# Patient Record
Sex: Female | Born: 1945 | Race: Black or African American | Hispanic: No | Marital: Married | Smoking: Current every day smoker
Health system: Southern US, Community
[De-identification: ages and names within clinical notes are randomized; demographics above are authoritative.]

## PROBLEM LIST (undated history)

## (undated) DIAGNOSIS — I5032 Chronic diastolic (congestive) heart failure: Secondary | ICD-10-CM

## (undated) DIAGNOSIS — I509 Heart failure, unspecified: Secondary | ICD-10-CM

## (undated) DIAGNOSIS — I2721 Secondary pulmonary arterial hypertension: Secondary | ICD-10-CM

## (undated) DIAGNOSIS — M549 Dorsalgia, unspecified: Secondary | ICD-10-CM

## (undated) DIAGNOSIS — E079 Disorder of thyroid, unspecified: Secondary | ICD-10-CM

## (undated) DIAGNOSIS — M1990 Unspecified osteoarthritis, unspecified site: Secondary | ICD-10-CM

## (undated) DIAGNOSIS — K219 Gastro-esophageal reflux disease without esophagitis: Secondary | ICD-10-CM

## (undated) DIAGNOSIS — I272 Pulmonary hypertension, unspecified: Secondary | ICD-10-CM

## (undated) DIAGNOSIS — Z720 Tobacco use: Secondary | ICD-10-CM

## (undated) DIAGNOSIS — J449 Chronic obstructive pulmonary disease, unspecified: Secondary | ICD-10-CM

## (undated) DIAGNOSIS — G629 Polyneuropathy, unspecified: Secondary | ICD-10-CM

## (undated) DIAGNOSIS — G8929 Other chronic pain: Secondary | ICD-10-CM

## (undated) DIAGNOSIS — IMO0002 Reserved for concepts with insufficient information to code with codable children: Secondary | ICD-10-CM

## (undated) DIAGNOSIS — I2510 Atherosclerotic heart disease of native coronary artery without angina pectoris: Secondary | ICD-10-CM

## (undated) DIAGNOSIS — F32A Depression, unspecified: Secondary | ICD-10-CM

## (undated) DIAGNOSIS — I10 Essential (primary) hypertension: Secondary | ICD-10-CM

## (undated) HISTORY — DX: Heart failure, unspecified: I50.9

## (undated) HISTORY — DX: Disorder of thyroid, unspecified: E07.9

## (undated) HISTORY — DX: Atherosclerotic heart disease of native coronary artery without angina pectoris: I25.10

## (undated) HISTORY — DX: Depression, unspecified: F32.A

## (undated) HISTORY — DX: Dorsalgia, unspecified: M54.9

## (undated) HISTORY — DX: Other chronic pain: G89.29

## (undated) HISTORY — DX: Reserved for concepts with insufficient information to code with codable children: IMO0002

## (undated) HISTORY — PX: BILROTH I PROCEDURE: SHX1231

## (undated) HISTORY — DX: Chronic diastolic (congestive) heart failure: I50.32

## (undated) HISTORY — DX: Tobacco use: Z72.0

## (undated) HISTORY — DX: Secondary pulmonary arterial hypertension: I27.21

## (undated) HISTORY — DX: Gastro-esophageal reflux disease without esophagitis: K21.9

## (undated) HISTORY — PX: STOMACH SURGERY: SHX791

## (undated) HISTORY — PX: CHOLECYSTECTOMY: SHX55

## (undated) HISTORY — DX: Pulmonary hypertension, unspecified: I27.20

## (undated) HISTORY — DX: Chronic obstructive pulmonary disease, unspecified: J44.9

## (undated) HISTORY — PX: BACK SURGERY: SHX140

## (undated) HISTORY — PX: ABDOMINAL HYSTERECTOMY: SHX81

## (undated) HISTORY — DX: Unspecified osteoarthritis, unspecified site: M19.90

---

## 2003-02-26 ENCOUNTER — Ambulatory Visit (HOSPITAL_COMMUNITY): Admission: RE | Admit: 2003-02-26 | Discharge: 2003-02-26 | Payer: Self-pay | Admitting: Family Medicine

## 2003-03-24 ENCOUNTER — Emergency Department (HOSPITAL_COMMUNITY): Admission: EM | Admit: 2003-03-24 | Discharge: 2003-03-24 | Payer: Self-pay | Admitting: Emergency Medicine

## 2003-05-18 ENCOUNTER — Ambulatory Visit (HOSPITAL_COMMUNITY): Admission: RE | Admit: 2003-05-18 | Discharge: 2003-05-18 | Payer: Self-pay | Admitting: Internal Medicine

## 2003-05-18 HISTORY — PX: ESOPHAGOGASTRODUODENOSCOPY: SHX1529

## 2003-05-18 HISTORY — PX: COLONOSCOPY: SHX174

## 2003-05-22 ENCOUNTER — Ambulatory Visit (HOSPITAL_COMMUNITY): Admission: RE | Admit: 2003-05-22 | Discharge: 2003-05-22 | Payer: Self-pay | Admitting: Internal Medicine

## 2003-06-11 ENCOUNTER — Ambulatory Visit (HOSPITAL_COMMUNITY): Admission: RE | Admit: 2003-06-11 | Discharge: 2003-06-11 | Payer: Self-pay | Admitting: Family Medicine

## 2004-01-10 ENCOUNTER — Emergency Department (HOSPITAL_COMMUNITY): Admission: EM | Admit: 2004-01-10 | Discharge: 2004-01-10 | Payer: Self-pay | Admitting: Emergency Medicine

## 2004-01-23 ENCOUNTER — Ambulatory Visit (HOSPITAL_COMMUNITY): Admission: RE | Admit: 2004-01-23 | Discharge: 2004-01-23 | Payer: Self-pay | Admitting: Family Medicine

## 2004-01-28 ENCOUNTER — Ambulatory Visit (HOSPITAL_COMMUNITY): Admission: RE | Admit: 2004-01-28 | Discharge: 2004-01-28 | Payer: Self-pay | Admitting: Family Medicine

## 2004-01-31 ENCOUNTER — Ambulatory Visit (HOSPITAL_COMMUNITY): Admission: RE | Admit: 2004-01-31 | Discharge: 2004-01-31 | Payer: Self-pay | Admitting: Family Medicine

## 2004-02-20 ENCOUNTER — Ambulatory Visit (HOSPITAL_COMMUNITY): Admission: RE | Admit: 2004-02-20 | Discharge: 2004-02-20 | Payer: Self-pay | Admitting: Neurosurgery

## 2005-02-24 ENCOUNTER — Ambulatory Visit (HOSPITAL_COMMUNITY): Admission: RE | Admit: 2005-02-24 | Discharge: 2005-02-24 | Payer: Self-pay | Admitting: Family Medicine

## 2005-08-08 ENCOUNTER — Emergency Department (HOSPITAL_COMMUNITY): Admission: EM | Admit: 2005-08-08 | Discharge: 2005-08-08 | Payer: Self-pay | Admitting: Emergency Medicine

## 2006-01-11 ENCOUNTER — Ambulatory Visit (HOSPITAL_COMMUNITY): Admission: RE | Admit: 2006-01-11 | Discharge: 2006-01-11 | Payer: Self-pay | Admitting: Family Medicine

## 2006-01-26 ENCOUNTER — Ambulatory Visit (HOSPITAL_COMMUNITY): Admission: RE | Admit: 2006-01-26 | Discharge: 2006-01-26 | Payer: Self-pay | Admitting: Family Medicine

## 2006-03-04 ENCOUNTER — Encounter: Admission: RE | Admit: 2006-03-04 | Discharge: 2006-03-04 | Payer: Self-pay | Admitting: Neurosurgery

## 2006-04-02 ENCOUNTER — Ambulatory Visit (HOSPITAL_COMMUNITY): Admission: RE | Admit: 2006-04-02 | Discharge: 2006-04-03 | Payer: Self-pay | Admitting: Neurosurgery

## 2006-06-18 ENCOUNTER — Ambulatory Visit (HOSPITAL_COMMUNITY): Admission: RE | Admit: 2006-06-18 | Discharge: 2006-06-18 | Payer: Self-pay | Admitting: Family Medicine

## 2006-10-28 ENCOUNTER — Emergency Department (HOSPITAL_COMMUNITY): Admission: EM | Admit: 2006-10-28 | Discharge: 2006-10-28 | Payer: Self-pay | Admitting: Emergency Medicine

## 2007-01-13 ENCOUNTER — Inpatient Hospital Stay (HOSPITAL_COMMUNITY): Admission: EM | Admit: 2007-01-13 | Discharge: 2007-01-18 | Payer: Self-pay | Admitting: Emergency Medicine

## 2007-02-06 ENCOUNTER — Inpatient Hospital Stay (HOSPITAL_COMMUNITY): Admission: EM | Admit: 2007-02-06 | Discharge: 2007-02-08 | Payer: Self-pay | Admitting: Emergency Medicine

## 2007-02-06 ENCOUNTER — Ambulatory Visit: Payer: Self-pay | Admitting: Cardiology

## 2007-05-19 ENCOUNTER — Encounter: Admission: RE | Admit: 2007-05-19 | Discharge: 2007-05-19 | Payer: Self-pay | Admitting: Neurosurgery

## 2007-06-21 ENCOUNTER — Ambulatory Visit (HOSPITAL_COMMUNITY): Admission: RE | Admit: 2007-06-21 | Discharge: 2007-06-21 | Payer: Self-pay | Admitting: Family Medicine

## 2007-08-31 ENCOUNTER — Ambulatory Visit: Admission: RE | Admit: 2007-08-31 | Discharge: 2007-08-31 | Payer: Self-pay | Admitting: Neurosurgery

## 2007-09-02 ENCOUNTER — Ambulatory Visit: Payer: Self-pay | Admitting: Cardiology

## 2007-09-08 ENCOUNTER — Ambulatory Visit: Payer: Self-pay | Admitting: Cardiology

## 2007-09-09 ENCOUNTER — Encounter: Admission: RE | Admit: 2007-09-09 | Discharge: 2007-09-09 | Payer: Self-pay | Admitting: Gastroenterology

## 2007-11-15 ENCOUNTER — Inpatient Hospital Stay (HOSPITAL_COMMUNITY): Admission: RE | Admit: 2007-11-15 | Discharge: 2007-11-18 | Payer: Self-pay | Admitting: Neurosurgery

## 2008-03-24 IMAGING — CR DG CHEST 1V PORT
1 series · 1 of 1 positions shown · non-contrast
Comparison: 01/14/07.

CLINICAL DATA: 61-year-old seizures, PICC line placement.  
 PORTABLE CHEST - 1 VIEW 02/07/07:

[view not recorded]
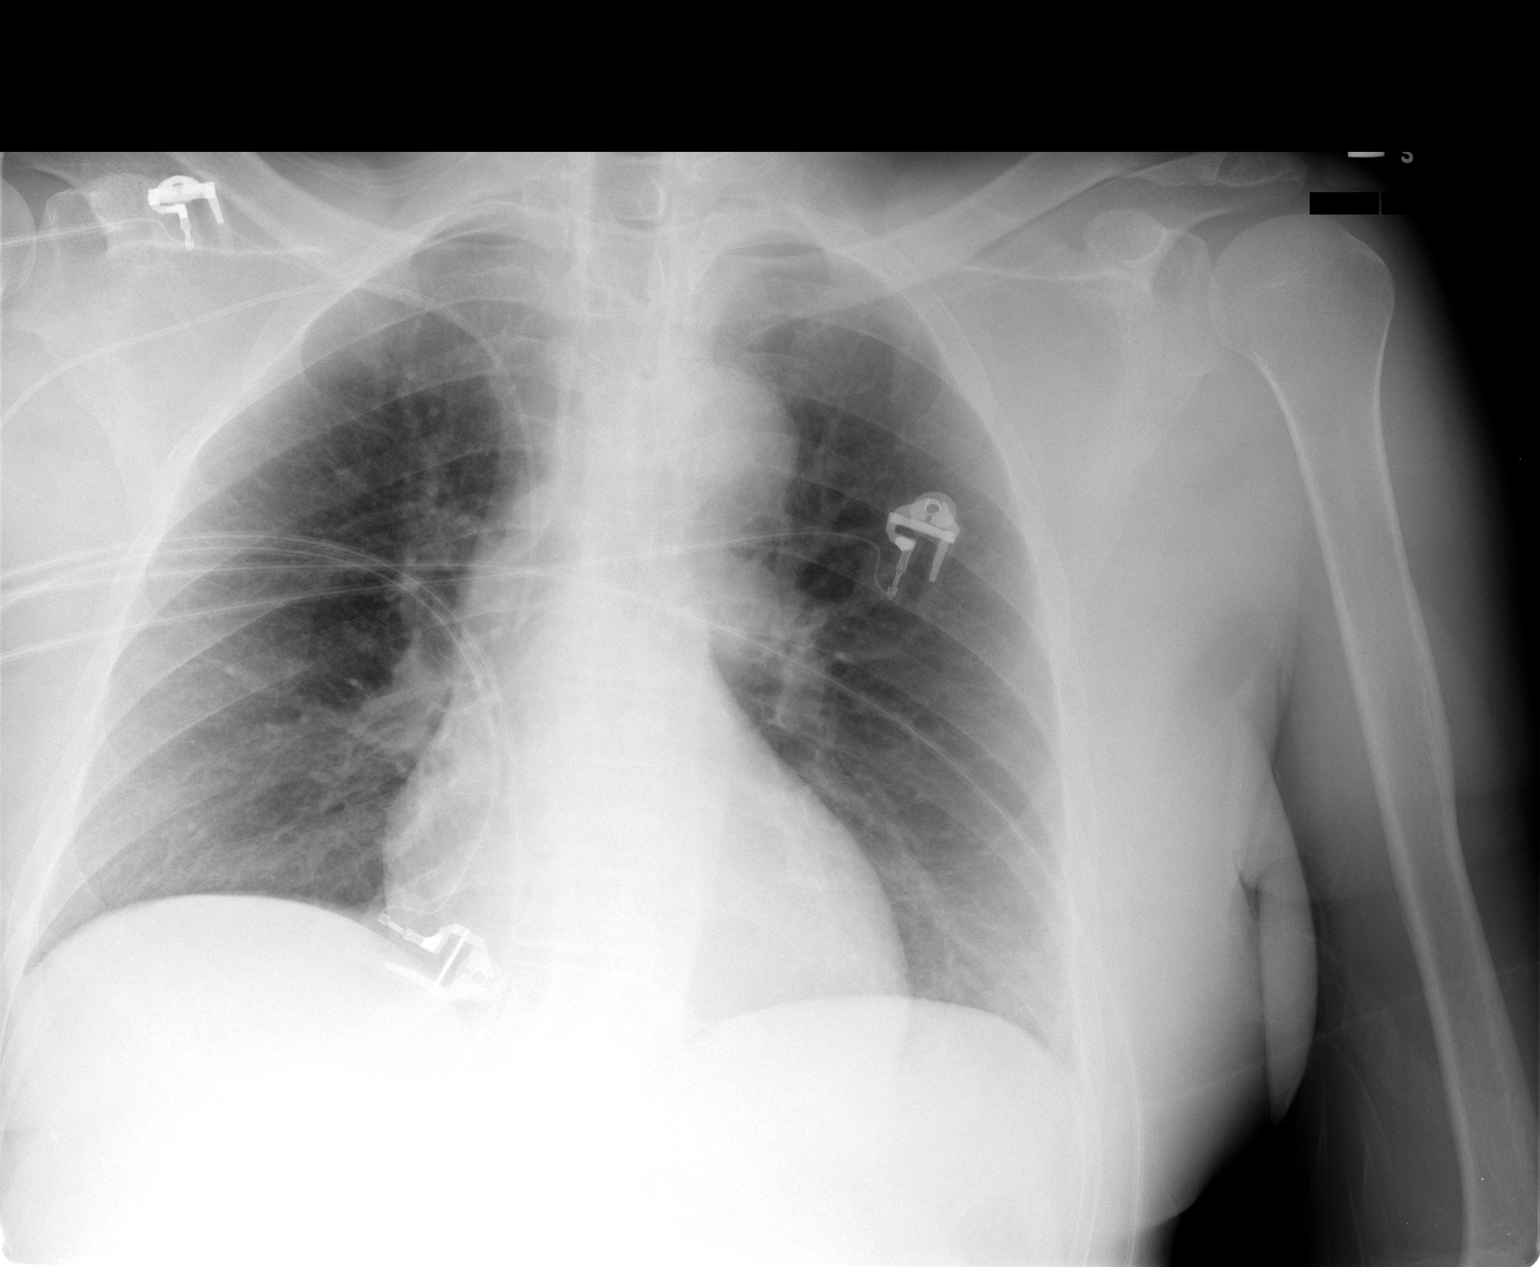

[1 of 1 positions shown; findings below may reference images not displayed]

FINDINGS: Cardiac silhouette, mediastinal contours are within normal limits and stable.  There is mild tortuosity of the thoracic aorta which is unchanged.  Slightly low lung volumes with mild vascular crowding and minimal streaky basilar atelectasis.  
 Right PICC line tip is in the distal SVC approximately 2 cm above the cavoatrial junction.
IMPRESSION: 1.  Right PICC line tip is in the distal SVC approximately 2 cm above the cavoatrial junction. 
 2.  No significant cardiopulmonary findings.  Minimal streaky basilar atelectasis.

## 2008-07-10 ENCOUNTER — Ambulatory Visit (HOSPITAL_COMMUNITY): Admission: RE | Admit: 2008-07-10 | Discharge: 2008-07-10 | Payer: Self-pay | Admitting: Family Medicine

## 2009-01-20 ENCOUNTER — Inpatient Hospital Stay (HOSPITAL_COMMUNITY): Admission: EM | Admit: 2009-01-20 | Discharge: 2009-01-21 | Payer: Self-pay | Admitting: Emergency Medicine

## 2009-07-30 ENCOUNTER — Ambulatory Visit (HOSPITAL_COMMUNITY): Admission: RE | Admit: 2009-07-30 | Discharge: 2009-07-30 | Payer: Self-pay | Admitting: Family Medicine

## 2009-11-26 ENCOUNTER — Ambulatory Visit (HOSPITAL_COMMUNITY): Admission: RE | Admit: 2009-11-26 | Discharge: 2009-11-26 | Payer: Self-pay | Admitting: Ophthalmology

## 2009-12-17 ENCOUNTER — Ambulatory Visit (HOSPITAL_COMMUNITY): Admission: RE | Admit: 2009-12-17 | Discharge: 2009-12-17 | Payer: Self-pay | Admitting: Ophthalmology

## 2010-06-19 LAB — BASIC METABOLIC PANEL
Creatinine, Ser: 0.55 mg/dL (ref 0.4–1.2)
GFR calc Af Amer: >60 mL/min (ref >60)
GFR calc non Af Amer: >60 mL/min (ref >60)

## 2010-07-10 LAB — BASIC METABOLIC PANEL
Calcium: 9.5 mg/dL (ref 8.4–10.5)
Chloride: 101 mEq/L (ref 96–112)
Creatinine, Ser: 0.68 mg/dL (ref 0.4–1.2)
GFR calc non Af Amer: >60 mL/min (ref >60)
Sodium: 136 mEq/L (ref 135–145)

## 2010-07-10 LAB — URINALYSIS, ROUTINE W REFLEX MICROSCOPIC
Bilirubin Urine: NEGATIVE
Glucose, UA: NEGATIVE mg/dL
Hgb urine dipstick: NEGATIVE
Ketones, ur: NEGATIVE mg/dL
Nitrite: NEGATIVE
Protein, ur: NEGATIVE mg/dL
Specific Gravity, Urine: 1.01 (ref 1.005–1.030)
Urobilinogen, UA: 0.2 mg/dL (ref 0.0–1.0)
pH: 6 (ref 5.0–8.0)

## 2010-07-10 LAB — CBC
HCT: 42.3 % (ref 36.0–46.0)
Hemoglobin: 14.2 g/dL (ref 12.0–15.0)
MCHC: 33.6 g/dL (ref 30.0–36.0)
MCV: 85.1 fL (ref 78.0–100.0)
Platelets: 206 K/uL (ref 150–400)
RBC: 4.97 MIL/uL (ref 3.87–5.11)
RDW: 15 % (ref 11.5–15.5)
WBC: 6.8 K/uL (ref 4.0–10.5)

## 2010-07-10 LAB — BASIC METABOLIC PANEL WITH GFR
BUN: 10 mg/dL (ref 6–23)
CO2: 28 meq/L (ref 19–32)
GFR calc Af Amer: >60 mL/min (ref >60)
Glucose, Bld: 93 mg/dL (ref 70–99)
Potassium: 3.9 meq/L (ref 3.5–5.1)

## 2010-07-10 LAB — DIFFERENTIAL
Basophils Absolute: 0.1 K/uL (ref 0.0–0.1)
Basophils Relative: 2 % — ABNORMAL HIGH (ref 0–1)
Eosinophils Absolute: 0 K/uL (ref 0.0–0.7)
Eosinophils Relative: 1 % (ref 0–5)
Lymphocytes Relative: 39 % (ref 12–46)
Lymphs Abs: 2.7 K/uL (ref 0.7–4.0)
Monocytes Absolute: 0.3 K/uL (ref 0.1–1.0)
Monocytes Relative: 5 % (ref 3–12)
Neutro Abs: 3.7 K/uL (ref 1.7–7.7)
Neutrophils Relative %: 54 % (ref 43–77)

## 2010-07-10 LAB — CK: Total CK: 92 U/L (ref 7–177)

## 2010-07-30 ENCOUNTER — Inpatient Hospital Stay (HOSPITAL_COMMUNITY): Admit: 2010-07-30 | Payer: Self-pay | Admitting: *Deleted

## 2010-08-05 ENCOUNTER — Encounter (HOSPITAL_COMMUNITY): Payer: Self-pay

## 2010-08-05 ENCOUNTER — Ambulatory Visit (HOSPITAL_COMMUNITY)
Admission: RE | Admit: 2010-08-05 | Discharge: 2010-08-05 | Disposition: A | Discharge status: Home / Self Care | Payer: Medicare Other | Source: Ambulatory Visit | Attending: Family Medicine | Admitting: Family Medicine

## 2010-08-05 ENCOUNTER — Other Ambulatory Visit (HOSPITAL_COMMUNITY): Payer: Self-pay | Admitting: Family Medicine

## 2010-08-05 DIAGNOSIS — K769 Liver disease, unspecified: Secondary | ICD-10-CM | POA: Insufficient documentation

## 2010-08-05 DIAGNOSIS — R1032 Left lower quadrant pain: Secondary | ICD-10-CM

## 2010-08-05 HISTORY — DX: Essential (primary) hypertension: I10

## 2010-08-05 MED ORDER — IOHEXOL 300 MG/ML  SOLN
100.0000 mL | Freq: Once | INTRAMUSCULAR | Status: AC | PRN
  Administered 2010-08-05: 100 mL via INTRAVENOUS

## 2010-08-09 NOTE — H&P (Signed)
  NAMEMAYAH, URQUIDI        ACCOUNT NO.:  [id number]  MEDICAL RECORD NO.:  [id number]           PATIENT TYPE:  O  LOCATION:  RAD                           FACILITY:  [facility]  PHYSICIAN:  Jamesetta So, M.D.  DATE OF BIRTH:  Sep 12, 1945  DATE OF ADMISSION:  08/05/2010 DATE OF DISCHARGE:  05/01/2012LH                             HISTORY & PHYSICAL   CHIEF COMPLAINT:  GERD, abnormal CT scan of the abdomen.  HISTORY OF PRESENT ILLNESS:  The patient is a 65 year old black female who presents for endoscopic evaluation.  She needs an EGD and colonoscopy due to history of worsening GERD and reflux as well as the need for screening colonoscopy.  She has been noted some increasing heartburn for the past few months.  She also had an abnormal CT scan of the abdomen and pelvis, which revealed increased thickening of the wall of the transverse colon.  No hematochezia, melena, or diarrhea have been noted.  Some constipation has been noted.  PAST MEDICAL HISTORY:  Hypertension.  PAST SURGICAL HISTORY:  Appendectomy, Billroth I gastrectomy, hysterectomy, cholecystectomy, back surgery.  CURRENT MEDICATIONS:  Hydrocodone, vitamins, hydrochlorothiazide, Prevacid, clonazepam.  ALLERGIES:  No known drug allergies.  REVIEW OF SYSTEMS:  The patient smokes several cigarettes a day.  She denies any alcohol or recreational drug use.  FAMILY MEDICAL HISTORY:  There is no family medical history of colon cancer.  PHYSICAL EXAMINATION:  GENERAL:  The patient is a well-developed, well- nourished black female in no acute distress. VITAL SIGNS:  Stable.  She is afebrile. HEENT:  Unremarkable. NECK:  Supple without lymphadenopathy. LUNGS:  Clear to auscultation with equal breath sounds bilaterally. HEART:  Regular rate and rhythm without S3, S4, or murmurs. ABDOMEN:  Soft, nontender, nondistended.  No hepatosplenomegaly or masses noted.  Multiple surgical scars are present. RECTAL:  Deferred to  the procedure.  IMPRESSION:  Gastroesophageal reflux disease, need for screening colonoscopy.  PLAN:  The patient is scheduled for an EGD and colonoscopy on Aug 19, 2010.  Risks and benefits of the procedure including bleeding and perforation were fully explained to the patient, gave informed consent.     Jamesetta So, M.D.     MAJ/MEDQ  D:  08/07/2010  T:  08/08/2010  Job:  [id number]  cc:   Estill Bamberg. Karie Kirks, M.D. Fax: [phone]  Electronically Signed by Aviva Signs M.D. on 08/09/2010 08:07:07 PM

## 2010-08-12 ENCOUNTER — Ambulatory Visit (HOSPITAL_COMMUNITY)
Admission: RE | Admit: 2010-08-12 | Discharge: 2010-08-12 | Disposition: A | Discharge status: Home / Self Care | Payer: Medicare Other | Source: Ambulatory Visit | Attending: General Surgery | Admitting: General Surgery

## 2010-08-12 DIAGNOSIS — I10 Essential (primary) hypertension: Secondary | ICD-10-CM | POA: Insufficient documentation

## 2010-08-12 DIAGNOSIS — Z79899 Other long term (current) drug therapy: Secondary | ICD-10-CM | POA: Insufficient documentation

## 2010-08-12 DIAGNOSIS — Z1211 Encounter for screening for malignant neoplasm of colon: Secondary | ICD-10-CM | POA: Insufficient documentation

## 2010-08-12 DIAGNOSIS — K219 Gastro-esophageal reflux disease without esophagitis: Secondary | ICD-10-CM | POA: Insufficient documentation

## 2010-08-12 HISTORY — PX: COLONOSCOPY: SHX174

## 2010-08-12 HISTORY — PX: ESOPHAGOGASTRODUODENOSCOPY: SHX1529

## 2010-08-13 NOTE — Op Note (Signed)
NAME:  [name], [name]        ACCOUNT NO.:  [id number]  MEDICAL RECORD NO.:  [id number]           PATIENT TYPE:  O  LOCATION:  DAYP                          FACILITY:  [facility]  PHYSICIAN:  Jamesetta So, M.D.  DATE OF BIRTH:  16-Sep-1945  DATE OF PROCEDURE:  08/12/2010 DATE OF DISCHARGE:                              OPERATIVE REPORT   PREOPERATIVE DIAGNOSIS:  Worsening gastroesophageal reflux disease, need for screening colonoscopy, abnormal CT scan of the abdomen.  POSTOPERATIVE DIAGNOSIS:  Worsening gastroesophageal reflux disease, need for screening colonoscopy, abnormal CT scan of the abdomen.  PROCEDURE:  Colonoscopy, EGD with biopsy.  SURGEON:  Jamesetta So, MD  ANESTHESIA: 1. Demerol 50 mg IV. 2. Versed 4 mg IV  INDICATIONS:  The patient is a 65 year old black female with multiple medical problems who presents with a CT scan of the abdomen and pelvis which revealed possible inflammatory changes in the transverse colon as well as worsening GERD.  The patient now comes to the endoscopy suite for colonoscopy and EGD.  Risks and benefits of the procedure including bleeding and perforation were fully explained to the patient, gave informed consent.  PROCEDURE NOTE:  The patient was placed in left lateral decubitus position after placement of nasal cannula, pulse oximetry, and noninvasive blood pressure monitoring.  Demerol and Versed were used throughout the procedure for anesthesia.  Rectal examination was performed which was negative.  The colonoscope was advanced to the cecum with mild difficulty secondary to tortuosity of the colon.  The bowel preparation was adequate.  The cecum was visualized and noted to be within normal limits.  Confirmation of placement to the cecum was done using transabdominal palpation and landmarks.  The ascending colon, transverse colon, descending colon, sigmoid colon, and rectum were all within normal limits.  No abnormal lesions  were noted.  On retroflexion of the colonoscope, the dentate line was inspected, noted to be within normal limits.  The transverse colon was looked at closely and no abnormal mucosal ulcerations were seen.  All air was evacuated from the colon and rectum prior to removal of the endoscope.  Next, an EGD was performed.  Hurricaine spray was used on the oropharynx.  The endoscope was advanced down to the duodenum and jejunum without difficulty.  The patient is status post a Billroth I gastroduodenostomy.  The anastomosis was noted to be widely patent.  No frank ulcerations were seen.  It appears that the patient has had a significant amount of her stomach excised.  A biopsy of the remaining stomach was taken and sent to pathology for CLO-test to rule out Helicobacter pylori infection.  There was no evidence of a hiatal hernia.  The Z-line was inspected and noted to be within normal limits. No strictures were seen.  The esophagus was within normal limits.  No frank ulcerations or esophagitis were seen.  All air was evacuated from the stomach and esophagus prior to removal of the endoscope.  The patient tolerated both procedures well and was transferred back to Day Surgery in stable condition.  COMPLICATIONS:  None.  SPECIMEN:  Stomach biopsy for CLO-test.  RECOMMENDATIONS:  The patient should continue her  b.i.d. dosing of a proton pump inhibitor.  She should be made aware that she needs to eat more small frequent meals and to sit upright when eating.  A followup colonoscopy is recommended in 10 years.  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     Jamesetta So, M.D.     MAJ/MEDQ  D:  08/12/2010  T:  08/13/2010  Job:  [id number]  cc:   Estill Bamberg. Karie Kirks, M.D. Fax: [phone]  Electronically Signed by Aviva Signs M.D. on 08/13/2010 09:18:32 AM

## 2010-08-19 NOTE — Group Therapy Note (Signed)
[name], [name]        ACCOUNT NO.:  [id number]   MEDICAL RECORD NO.:  [id number]          PATIENT TYPE:  INP   LOCATION:  IC03                          FACILITY:  [facility]   PHYSICIAN:  Estill Bamberg. Karie Kirks, M.D.DATE OF BIRTH:  1945/09/06   DATE OF PROCEDURE:  DATE OF DISCHARGE:                                 PROGRESS NOTE   SUBJECTIVE:  The patient upset, said she wants to go home.   OBJECTIVE:  VITALS:  Today the BP is 96/59, earlier 81/46.  Temperature  97.9, pulse 71, respiratory rate 21.  GENERAL:  She is crying and upset.  Appears to be well-developed, well-  nourished.  No acute distress other than upset about being in hospital.  LUNGS:  Clear throughout.  HEART:  Regular rhythm.  Rate of about 70.  ABDOMEN:  Soft.  EXTREMITIES:  There is no edema of the ankles.   A rhythm strip was examined at the time she is having seizures and looks  normal other than the artifact.   She had recurrent seizures last night in emergency room.  I discussed  case with Dr. Merlene Laughter, neurologist, and gave another 500 mg of  Fosphenytoin IV.  He has had a couple of seizures last night with this  but are controlled with Ativan.   ASSESSMENT:  Status epilepticus.   PLAN:  1. Continue the ICU.  We are adding Depakote 1 gram IV immediately      followed by 500 mg IV q.8 h.  Dilantin level pending.  The Dr.      Merlene Laughter will be seeing her today.  He is also being started on low-      dose phenobarbital.  2. We are holding the triamterene HCTZ since her blood pressure is so      low today.  Adding on Vicodin 5/500 for the low back pain she      engendered with falling at home.   Her O2 sat was 96% 2 liters nasal cannula.        Estill Bamberg. Karie Kirks, M.D.  Electronically Signed    SDK/MEDQ  D:  02/07/2007  T:  02/07/2007  Job:  [id number]

## 2010-08-19 NOTE — Group Therapy Note (Signed)
NAMEJALACIA, [name]        ACCOUNT NO.:  [id number]   MEDICAL RECORD NO.:  [id number]          PATIENT TYPE:  INP   LOCATION:  IC03                          FACILITY:  [facility]   PHYSICIAN:  Edward L. Luan Pulling, M.D.DATE OF BIRTH:  08-08-1945   DATE OF PROCEDURE:  DATE OF DISCHARGE:                                 PROGRESS NOTE   Patient of Dr. Karie Kirks.  [name] is more alert.  Dr. Merlene Laughter has  seen her and it is not totally clear why she has had seizures, but she  does now have a therapeutic Dilantin level at 10.3 and her respiratory  failure was clearly related to her seizure disorder, so I think it is  reasonable for [place] to go ahead and see if we get her off the ventilator  now.   EXAM:  Shows that she is alert.  Pulse is in the 90s, blood pressure about 120/70.  Her chest is fairly clear.  Her heart is regular.  Her abdomen soft.   Her Dilantin level 10.3.  BMET normal.  Blood gas on 40%, 500 rate of  12, 5 of PEEP, pH 7.44, pCO2 of 37, pO2 of 92, and CBC shows white count  8700, hemoglobin 12.1, platelets 233,000.   ASSESSMENT:  She seems better.   PLAN:  I am going to see if we can work on, hopefully, getting her off  of the ventilator today.      Edward L. Luan Pulling, M.D.  Electronically Signed     ELH/MEDQ  D:  01/14/2007  T:  01/14/2007  Job:  [id number]

## 2010-08-19 NOTE — H&P (Signed)
NAMEJAQUISHA, FRECH        ACCOUNT NO.:  [id number]   MEDICAL RECORD NO.:  [id number]          PATIENT TYPE:  INP   LOCATION:  IC03                          FACILITY:  [facility]   PHYSICIAN:  Angus G. Everette Rank, MD   DATE OF BIRTH:  09/12/1945   DATE OF ADMISSION:  01/13/2007  DATE OF DISCHARGE:  LH                              HISTORY & PHYSICAL   This 65 year old female apparently had a seizure prior to admission.  The patient apparently collapsed after having this seizure and stopped  breathing, and she was resuscitated.  Apparently had a series of five  seizures en route to the ED.  The patient was seen by ED physician and  was intubated and was on the respirator.  At the time I saw her, the  patient was fairly comfortable.  Apparently a study was done, a CT of  the head was done shortly after admission to the ED and was negative.  Lab studies:  Drug screen positive for benzodiazepines.  Blood gases  initially showed a pH of 7.400 with a pCO2 of 41.4, pO2 163.  Chemistries:  Sodium 137, potassium 3.7, chloride 103, CO2 25, glucose  110, BUN 24, creatinine 0.67.  GFR greater than 60.  Liver enzymes  essentially normal, SGOT 16, SGPT 14, total protein 7.6.  CBC:  WBC 8500  with a hemoglobin of 13.8, hematocrit 40.9.  blood cultures were  obtained.  Urine culture obtained.  The patient was started on  intravenous fluids and IV Rocephin and Zithromax.  I discussed this  situation with the ED physician and the patient was subsequently  admitted to ICU.  She was given Dilantin in emergency department.   The patient has a past medical history of hypertension, sciatica.  Surgery:  Appendectomy, cholecystectomy, hysterectomy   SOCIAL HISTORY:  The patient does not drink alcohol or use drugs.  Smokes a half-pack of cigarettes daily.   ALLERGIES:  No known drug allergies.   MEDICATION LIST:  1. Pantoprazole 40 mg daily.  2. Triamterene/hydrochlorothiazide 37.5/25 mg daily.   REVIEW  OF SYSTEMS:  Unable to obtain.   EXAMINATION:  The patient at the time of my exam, she was on the  ventilator.  Blood pressure 155/60, respirations 28, pulse 111.  HEENT:  Eyes:  PERRLA.  TMs negative.  Oropharynx benign.  NECK:  Supple.  No JVD or thyroid abnormalities.  HEART:  Regular rhythm, no murmurs.  LUNGS:  Clear to P&A.  NEUROLOGIC:  No focal deficit.  ABDOMEN:  No palpable organs or masses.  EXTREMITIES:  Free of edema.   DIAGNOSIS:  Multiple seizures of undetermined etiology.  The patient was  apneic following seizure and placed on a respirator.  No evidence of  intracranial bleeding, tumor, etc.   PLAN:  To continue the patient on the respirator.  Dr. Luan Pulling and Dr.  Merlene Laughter will see the patient in consultation.  The patient will be  admitted to ICU.      Angus G. Everette Rank, MD  Electronically Signed     AGM/MEDQ  D:  01/13/2007  T:  01/14/2007  Job:  [id number]

## 2010-08-19 NOTE — Group Therapy Note (Signed)
[name], THREATS        ACCOUNT NO.:  [id number]   MEDICAL RECORD NO.:  [id number]          PATIENT TYPE:  INP   LOCATION:  A219                          FACILITY:  [facility]   PHYSICIAN:  Estill Bamberg. Karie Kirks, M.D.DATE OF BIRTH:  [birth year]   DATE OF PROCEDURE:  DATE OF DISCHARGE:                                 PROGRESS NOTE   SUBJECTIVE:  She is really feeling fine today.  She has had no more  seizures.  Still in the ICU.  She is able to eat.   I went over the history of her seizures again.  She had been working at  the [employer], drove home, fell a little bit weak, began to be  shaky, then began to have a seizure in front of her husband.  He  personally witnessed this.  She was unresponsive during the seizure.   OBJECTIVE:  She is now sitting up in a chair.  She is well-developed,  well-nourished oriented, alert.  No acute distress.  BP 106/76, pulse 102, respiratory rate 22.  O2 sat on 2L 99%.  Lungs appear clear throughout.  She is moving air well.  HEART:  A regular rhythm rate of about 90.  ABDOMEN:  Soft.  There is no edema of the ankles.   ASSESSMENT:  Epilepsy question etiology.   PLAN:  MRI the brain is still pending.  We will transfer out to the  floor, take out her Foley, keep her on CCNT at present.      Estill Bamberg. Karie Kirks, M.D.  Electronically Signed     SDK/MEDQ  D:  01/15/2007  T:  01/15/2007  Job:  [id number]

## 2010-08-19 NOTE — Procedures (Signed)
[name], [name]        ACCOUNT NO.:  [id number]   MEDICAL RECORD NO.:  [id number]          PATIENT TYPE:  INP   LOCATION:  IC03                          FACILITY:  [facility]   PHYSICIAN:  Kofi A. Merlene Laughter, M.D. DATE OF BIRTH:  [birth year]   DATE OF PROCEDURE:  DATE OF DISCHARGE:                              EEG INTERPRETATION   This is a 65 year old lady who presents with status epilepticus and  confusion.   MEDICATIONS:  1. Dilantin.  2. Protonix.  3. Nicotine.  4. Rocephin.  5. Azithromycin.  6. Diprivan.  7. Fentanyl.  8. P.r.n. Ativan.   ANALYSIS:  This is a 16-channel recording that is conducted for 22  minutes. There is a posterior rhythm of 12 hertz which attenuates with  eye opening. Awake and asleep architectures are observed. There is K  complexes and spindles observed throughout the recording. In fact, the  patient had excessive spindling noted throughout the recording. Photic  stimulation does not result in significant changes in the background  activity. There is no focal slowing, lateralized slowing or epileptiform  activity observed.   IMPRESSION:  This recording shows excessive spindling which is a  medication effect easily from benzodiazepines. There is no epileptiform  activity observed. A single EEG recording does not rule out epileptic  seizures.  A sleep-deprived recording is suggested.      Kofi A. Merlene Laughter, M.D.  Electronically Signed     KAD/MEDQ  D:  01/15/2007  T:  01/15/2007  Job:  [id number]

## 2010-08-19 NOTE — Group Therapy Note (Signed)
NAMEKIANAH, HARRIES        ACCOUNT NO.:  [id number]   MEDICAL RECORD NO.:  [id number]          PATIENT TYPE:  INP   LOCATION:  A219                          FACILITY:  [facility]   PHYSICIAN:  Edward L. Luan Pulling, M.D.DATE OF BIRTH:  1945/06/12   DATE OF PROCEDURE:  01/16/2007  DATE OF DISCHARGE:                                 PROGRESS NOTE   Ms. Corrigan has now been transferred from the intensive care unit and  seems to be doing better.  She keeps asking about going home.  She does  not seem to be having any respiratory distress now.   Temperature is 98.2, pulse 86, respirations 18, blood pressure 109/65,  O2 saturation 96% on 1 liter.   PLAN:  Will be to go ahead and sign off at this point.  I will, of  course, be glad to see her at anytime at your request.  Obviously, I  think, her major problem was not a lung problem, but more of the seizure  disorder; and she has what may be a mass in the cerebellum now.  Thanks  for allowing me to see her with you.      Edward L. Luan Pulling, M.D.  Electronically Signed     ELH/MEDQ  D:  01/16/2007  T:  01/17/2007  Job:  [id number]

## 2010-08-19 NOTE — Op Note (Signed)
NAMETENELLE, ANDREASON        ACCOUNT NO.:  [id number]   MEDICAL RECORD NO.:  [id number]          PATIENT TYPE:  INP   LOCATION:  3012                         FACILITY:  [facility]   PHYSICIAN:  Elizabeth Sauer, M.D.      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   PROCEDURES:  1. L3-4, L4-5, and L5-S1, left-sided TLIF.  2. Segmental pedicle screw fixation from L3-S1.  3. Posterolateral arthrodesis L3-S1.   SURGEON:  Elizabeth Sauer, MD   ANESTHESIA:  General endotracheal.   PREPARATION:  Prepped and draped with alcohol wipe.   COMPLICATIONS:  None.   NURSE ASSISTANT:  [place].   DOCTOR ASSISTANT:  Ashok Pall, MD   BODY OF TEXT:  A 66 year old lady with left L3-4 foraminal diskectomy  done couple of years ago with scarring and left foraminal obstruction  and a new foraminal disk on the left side at L4-5.  Taken in the  operating room, smoothly anesthetized and intubated, was placed prone on  the operating table.  Following shave, prep, and drape in the usual  sterile fashion, the skin was then infiltrated with 1% lidocaine with  1:4000 epinephrine.  The skin was incised from mid L3 to mid S1 and the  lamina, and the transverse processes of L3, L4, L5 and the sacral ala  were exposed bilaterally in subperiosteal plane.  Intraoperative x-ray  confirmed correctness level, having confirmed correctness level on the  left side, the pars reticularis and lamina inferior facet of L3, L4 and  L5 were removed and the left L3, L4 and L5 and S1 roots dissected free.  At L3-4, there was foraminal obstructions secondary to scar and a small  amount of disk.  At L4-5, there was a large foraminal disk removed, the  L5-S1  had relatively normal anatomy.  Following complete decompression  at these levels, 10 x 30-mm PEEK cages were placed in an transverse  fashion at L4-5 and L5-S1.  At L3-L4, a 25 x 10-mm PEEK cages were  placed.  They had previously been packed with bone graft harvested from  the facet resection.  Pedicle screws were then placed bilaterally at L3,  L4, L5 and S1, and intraoperative x-ray showed good placement of screws  and cages.  The screws were attached to the rods with the locking caps.  The transverse processes on both sides and the lamina on the right side  of L3, L4, L5, and S1 were decorticated with the high-speed drill and  packed with BMP on the extender matrix.  Final tightening was applied to  the locking caps.  Final x-ray showed good placement of rods, screws,  and cages.  Successive layers of 0 Vicryl, 2-0 Vicryl, and 3-0 nylon  were used to close.  Betadine and Telfa dressing was applied and made  occlusive with OpSite and the patient returned to the recovery  room in  good condition.           ______________________________  Elizabeth Sauer, M.D.     MWR/MEDQ  D:  11/15/2007  T:  11/16/2007  Job:  [id number]

## 2010-08-19 NOTE — Group Therapy Note (Signed)
[name], [name]        ACCOUNT NO.:  [id number]   MEDICAL RECORD NO.:  [id number]          PATIENT TYPE:  INP   LOCATION:  A219                          FACILITY:  [facility]   PHYSICIAN:  Estill Bamberg. Karie Kirks, M.D.DATE OF BIRTH:  [birth year]   DATE OF PROCEDURE:  DATE OF DISCHARGE:                                 PROGRESS NOTE   SUBJECTIVE:  The patient really wants to go home.  Does note a trembling  in her thigh she has had ever since she had come in the hospital.  She  notes she can not really feel this on the outside, but feels it inside;  has not gotten better either with anxiolytics or narcotic pain  medication.  She has had no further seizures.  She does note, however,  while she was having the seizure at home she was perfectly awake, but  she just could not control the shaking of her arms or legs.   OBJECTIVE:  Temperature 98.2, pulse 86, rest rate 18, blood pressure  109/65.  She is a woman who is tall and thin, is ambulating in the room.  She is  really in no acute distress.  She appears to be oriented and alert.  She  broke out into tears as we discussed her diagnosis, however.  HEART:  A regular rhythm rate of 90.  LUNGS:  Clear throughout.  ABDOMEN:  Soft without tenderness.  She stood up and could feel the trembling in her legs, but I could not  feeling any trembling fairly on palpating the outside skin of the  thighs.  O2 sat is 96% on 1 liter.   MRI the brain with contrast was consistent with multiple sclerosis, but  this diagnosis was not conclusive.   ASSESSMENT:  1. Probable multiple sclerosis.  2. 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   PLAN:  Continue with the Dilantin.  Discussed the possibility that she  does have MS and the need for the neurologist to confirm this.  Meanwhile, we will get her ambulating with assistance.  She was  extremely upset about the possible diagnosis, but I tried to reassure  her in that we would let  the neurologist help [place] control this disease,  if in fact that is what it is.      Estill Bamberg. Karie Kirks, M.D.  Electronically Signed     SDK/MEDQ  D:  01/16/2007  T:  01/17/2007  Job:  [id number]

## 2010-08-19 NOTE — Consult Note (Signed)
[name], [name]        ACCOUNT NO.:  [id number]   MEDICAL RECORD NO.:  [id number]          PATIENT TYPE:  INP   LOCATION:  IC03                          FACILITY:  [facility]   PHYSICIAN:  [name]. Lattie Haw, MD, FACCDATE OF BIRTH:  1946-02-28   DATE OF CONSULTATION:  02/08/2007  DATE OF DISCHARGE:  02/08/2007                                 CONSULTATION   HISTORY OF PRESENT ILLNESS:  A 65 year old woman referred for evaluation  of abnormal telemetry tracings.  [name] has no significant  cardiac history.  She has never previously been evaluated by a  cardiologist.  She believes she has had an echocardiogram at [facility],  but there is no record of such testing.  She has good exercise  tolerance, walking fair distances without symptoms.  She has not  experienced any significant chest discomfort.  She has occasional  episodes of lightheadedness when arising from a chair or from a lying  position, but has not suffered syncope other than as associated with  grand mal seizures.  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  She returned with recurrent seizures.  She had a rapid  activity on her telemetry strips during seizures.  Although some of the  tracings are in decipherable, most of them clearly show underlying sinus  tachycardia.   PAST MEDICAL HISTORY:  1. Hypertension that has been well-controlled with medical therapy.  2. She has not had diabetes.  3. She is cigarette smoker with total consumption of approximately 20      - 30 pack-years.  4. She he is unaware of her cholesterol status.   SURGICAL PROCEDURES:  1. Appendectomy.  2. Cholecystectomy.  3. Hysterectomy.  4. 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  She has undergone      additional abdominal surgery for bowel obstruction and has had a      partial gastrectomy.   MEDICATIONS ON ADMISSION:  1. Dilantin 300 mg daily.  2. Dyazide 37.5/25 mg daily.  3. Protonix 40 mg daily.  4.  Hydrocodone p.r.n.   ALLERGIES:  NO KNOWN DRUG ALLERGIES.   SOCIAL HISTORY:  No excessive use of alcohol; married and lives locally.   FAMILY HISTORY:  Both parents are deceased and both had severe  hypertension.   REVIEW OF SYSTEMS:  Notable for chronic back pain, some arthritic  discomfort elsewhere, a regular diet at home, impaired vision with  corrective lenses required for reading, upper dentures.  She reports  anxiety.  She has no sleep disorder.  All other systems reviewed and are  negative.   PHYSICAL EXAMINATION:  GENERAL:  Pleasant woman in no acute distress.  VITAL SIGNS:  The heart rate is 90 and regular, blood pressure 120/80,  respirations 14 and unlabored.  Afebrile.  HEENT:  EOMs full; anicteric sclerae; normal lids and conjunctiva.  NECK:  No jugular venous distention; no carotid bruits.  ENDOCRINE:  No thyromegaly.  LUNGS:  Clear.  CARDIAC:  Normal first and second heart sounds; fourth heart sound  present.  ABDOMEN:  Soft and nontender; no organomegaly.  EXTREMITIES:  Edema 1/2+ on the  left.   STUDIES:  EKG:  Normal sinus rhythm; left atrial abnormality; delayed R-  wave progression.   LABORATORY DATA:  Other laboratories notable for normal CBC and normal  chemistry profile.   IMPRESSION:  [name] has fairly modest cardiovascular risk factors  and no symptoms to suggest significant cardiac disease.  The abnormal  telemetry tracings represent artifact.  Blood pressure control is good.  I would recommend no further cardiac testing and see no reason to  prolong her hospitalization.  I will be available to assist in her care  in the future as needed.      [name]. Lattie Haw, MD, [facility]  Electronically Signed     RMR/MEDQ  D:  02/08/2007  T:  02/09/2007  Job:  [id number]

## 2010-08-19 NOTE — Consult Note (Signed)
[name], [name]        ACCOUNT NO.:  [id number]   MEDICAL RECORD NO.:  [id number]          PATIENT TYPE:  INP   LOCATION:  IC03                          FACILITY:  [facility]   PHYSICIAN:  Kofi A. Merlene Laughter, M.D. DATE OF BIRTH:  1945-10-02   DATE OF CONSULTATION:  02/07/2007  DATE OF DISCHARGE:                                 CONSULTATION   REASON FOR CONSULTATION:  Multiple seizures/status epilepticus.   A 65 year old black female who presented a few weeks ago with new onset  seizure.  At that time, the patient had 5 seizures back to back.  She  was loaded with Dilantin and discharged home.  She did have an EEG while  hospitalized which was unrevealing.  Other workup was unrevealing.  She  was apparently doing well until last week or so when she apparently  started having multiple seizures again.  They are characterized by  generalized shaking with apparently no oral trauma or urine  incontinence.  She does appear to return to baseline rather quickly.  The patient was given Ativan and after the patient called the office but  apparently she continued to have these event and she was subsequently  taken to the hospital and admitted.  She was placed in the ICU and  loaded with Dilantin because her level was in the lower therapeutic  range; however, she continued to have events and was subsequently given  Depakote.  I happened to actually catch the last event on entering the  exam room.  It appeared that she had at least 2 events, one during the  EEG recording which I personally did not see but she had another event  just after the EEG recording on me entering the exam room.  It was shake  involving upper and lower extremities characterized by moderate  amplitude, moderate frequency activity.  The patient reports not having  significant loss of consciousness or alteration of consciousness during  this current event and apparently has some alteration of consciousness  during some of  these events.  During the present event, this was not the  case.  The patient returned to baseline immediately after the event  without any postictal state.   PAST MEDICAL HISTORY:  1. Seizures.  2. Gastroesophageal reflux disease.  3. Hypertension.  4. Anxiety disorder.   SOCIAL HISTORY:  She has a very supportive family.  She is married and  lives with her husband, has a son.   ADMISSION MEDICATION:  1. Dilantin 300 mg a day.  2. Protonix 40 mg a day.  3. Maxzide and Ativan.   REVIEW OF SYSTEMS:  Essentially unrevealing.  No clear focal neurologic  symptoms.  No headaches.  The patient does not report having any  significant psychosocial stressors.  She does work in the [employer].  She does have a history of smoking half a pack of cigarettes  a day. No illicit drug use.  No alcohol use.   FAMILY HISTORY:  There is apparently a family history of seizures.   PHYSICAL EXAMINATION:  Shows a pleasant, thin lady in no acute distress.  Temperature 98.3.  Pulse  84.  Respirations 22.  Blood pressure 170/74.  HEENT:  Neck is supple.  Head is normocephalic, atraumatic.  ABDOMEN:  Soft.  EXTREMITIES:  No significant edema.  MENTATION:  Patient is awake and alert.  She is lucid and coherent.  Follows commands bilaterally.  CRANIAL NERVE EVALUATION:  Pupils equal, round, and reactive to light  and accommodation.  She does have strabismus.  Extraocular movements are  intact.  She does have significant sustained nystagmus bilaterally  likely due to antiepileptic medication.  Facial muscle strength is  symmetric.  Tongue is midline.  Uvula is midline.  Shoulder shrugs are  normal.  MOTOR:  Examination shows normal tone, bulk, and strength.  There is no  pronator drift.  Coordination is intact.  Reflexes are +2 and symmetric.  Plantar flexors are both downgoing.  Sensation is normal to temperature  and light touch.   IMPRESSION:  The patient presents with the presumptive  diagnosis of  status epilepticus; however, I believe the evidence is now pointing to  these spells being nonepileptic although she is certainly having  multiple of these events.  This conclusion is reached given 2 negative  EEG recordings, particularly the current recording obtained while the  patient had one of these events.  This recording showed no  electrographic correlates with her event.  Additionally, the patient  continues to have these events while on 2 antiepileptic medications.   PLAN:  I had a lengthy discussion with the patient and her husband  regarding my impression and findings.  I believe we will discontinue the  Dilantin.  We will continue the Depakote and change it to p.o.  formulation.  I want to add Lexapro and clonazepam.  If she continues to  have these events, I would suggest that she would benefit from long-term  EEG video monitoring to further bolster my impression that these are  likely nonepileptic.  However, this may not be necessary.  Psychiatric  input is also suggested.   Thanks for this consultation.      Kofi A. Merlene Laughter, M.D.  Electronically Signed     KAD/MEDQ  D:  02/08/2007  T:  02/08/2007  Job:  [id number]

## 2010-08-19 NOTE — Group Therapy Note (Signed)
[name], [name]        ACCOUNT NO.:  [id number]   MEDICAL RECORD NO.:  [id number]          PATIENT TYPE:  INP   LOCATION:  A219                          FACILITY:  [facility]   PHYSICIAN:  Estill Bamberg. Karie Kirks, M.D.DATE OF BIRTH:  25-Apr-1945   DATE OF PROCEDURE:  DATE OF DISCHARGE:                                 PROGRESS NOTE   SUBJECTIVE:  Generally doing well but apparently she had a very small  seizure yesterday afternoon around 5 p.m. lasting not more than 15  seconds.  Her husband was in the room and son was not sure that nursing  had been called about this.  Angi notes that they now found out  that her 18 year old brother developed seizures over 4 or 5 years ago  without any history of head trauma or any precipitating events.   OBJECTIVE:  She is sitting up in bed, appears to oriented and alert, no  acute distress, well-developed, well-nourished.  Temperature 98.1, pulse  88, respiratory rate 20, blood pressure 113/67.  Lungs are clear  throughout, moving air well.  The hear has a regular rhythm, rate of 70.  There is no edema of the ankles or lower legs.  Abdomen is benign, skin  turgor normal, and O2 saturation is 98% in room air.   ASSESSMENT:  1. Epilepsy.  2. Possible multiple sclerosis.   PLAN:  Will discuss with Dr. Merlene Laughter.  Meanwhile, continue on the  Dilantin.  Discussed at length with the patient and her son.  Hopefully,  get her home tomorrow once Dr. Merlene Laughter has seen her.      Estill Bamberg. Karie Kirks, M.D.  Electronically Signed     SDK/MEDQ  D:  01/17/2007  T:  01/18/2007  Job:  [id number]

## 2010-08-19 NOTE — Group Therapy Note (Signed)
[name], [name]        ACCOUNT NO.:  [id number]   MEDICAL RECORD NO.:  [id number]          PATIENT TYPE:  INP   LOCATION:  A219                          FACILITY:  [facility]   PHYSICIAN:  Estill Bamberg. Karie Kirks, M.D.DATE OF BIRTH:  [birth year]   DATE OF PROCEDURE:  DATE OF DISCHARGE:                                 PROGRESS NOTE   ADDENDUM  MR of the brain without contrast showed a cerebellar lesion, but without  contrast, I could not rule out mass.  There were scattered white matter  lesions elsewhere, which could be mini strokes or MS plaques according  to the radiologist.  Given this, attempt today will be to get MR with  contrast, get better IV access, etc.      Estill Bamberg. Karie Kirks, M.D.  Electronically Signed     SDK/MEDQ  D:  01/15/2007  T:  01/15/2007  Job:  [id number]

## 2010-08-19 NOTE — Assessment & Plan Note (Signed)
[facility] OFFICE NOTE   [name], [name]               MRN:          [id number]  DATE:09/02/2007                            DOB:          Aug 28, 1945    REASON FOR CONSULTATION:  Abnormal electrocardiogram.   [name] is a pleasant 65 year old female with no known history of  heart disease, but with several cardiac risk factors, who is scheduled  to undergo lumbar laminectomy with diskectomy and placement of a Ray  cage, by Dr. Carloyn Manner, on June 2nd.  A preop routine 12-lead  electrocardiogram, however, suggested evidence of possible septal  infarct.  The patient is thus now referred to [place] for further evaluation.   [name]'s cardiac risk factors are notable for long-standing  tobacco smoking, history of hypertension, and age.   The patient states that she has never been hospitalized for chest  discomfort and, in fact, has never experienced any exertional chest  discomfort or significant exertional dyspnea.  In fact, up until this  recent severe lower back pain, she had been able to do whatever she  needed to do, with no significant associated symptoms.   The recent routine preoperative EKG was read as NSR at 80 BPM with  normal axis.  There is suggestion of a Q wave in lead V1, which was  interpreted as new compared to a previous EKG of December 2007.   A 12-lead EKG in our office today indicates sinus tachycardia at 100  BPM, normal axis, and suggestion of a Q-wave in V1 with no associated R  wave.  There is a small R wave in V2 followed by normal transition.  There are no significant ST segment changes.   ALLERGIES:  No known drug allergies.   HOME MEDICATIONS:  1. Lorazepam 1 mg q.i.d.  2. Amitriptyline 50 mg nightly.  3. Omeprazole 20 mg daily.  4. Hydrocodone 10 mg p.r.n.   PAST MEDICAL HISTORY:  1. Hypertension, previously treated with hydrochlorothiazide.  2. Non-epileptic  seizures, followed by Dr. Morene Antu in [place].   SURGICAL HISTORY:  1. Appendectomy.  2. Remote stomach surgery.  3. Hysterectomy.   REVIEW OF SYSTEMS:  Denies history of myocardial infarction or  congestive heart failure.  Denies any history of significant exertional  dyspnea or exertional chest discomfort.  Denies PND or orthopnea, but  does have some mild, chronic lower extremity edema.  Denies history of  palpitations.  Remaining systems negative.   SOCIAL HISTORY:  The patient is married, has 1 son, and is employed at  [employer] in [place].  However, she has been out of work since the  fall secondary to severe back pain.  There is no history of tobacco or  alcohol use.   FAMILY HISTORY:  Negative for coronary artery disease.   PHYSICAL EXAMINATION:  Blood pressure 151/86, pulse 103 regular, weight  177.6.  GENERAL:  [name], sitting upright, no distress.  HEENT:  Normocephalic, atraumatic.  NECK:  Palpable bilateral carotid pulses with no bruits; no JVD at 90  degrees.  LUNGS:  Clear to auscultation in all fields.  HEART:  Regular rate and rhythm (S1, S2), no significant murmurs, no  rubs.  ABDOMEN:  Soft, nontender, intact bowel sounds.  EXTREMITIES:  Palpable dorsalis pedis pulses with trace pedal edema.  NEURO:  No focal deficit.   IMPRESSION:  1. Abnormal resting electrocardiogram, question of prior occult septal      infarct.  2. Multiple cardiac risk factors.      a.     History of hypertension.      b.     Long-standing tobacco.      c.     Age.   PLAN:  1. Schedule baseline 2-D echocardiogram as well as a dobutamine stress      echocardiogram for risk stratification.  If these studies are both      normal and show no evidence of ischemia, then no further cardiac      workup is indicated.  The patient can then be cleared to proceed      with surgery, which will be rescheduled from this upcoming June 2      date.  Of note, we will try to  expedite this evaluation as quickly      as we can, given that [name] states that she is in      considerable pain and is anxious to proceed with this surgery.  2. Check baseline fasting lipid profile.  We will forward this      information to Dr. Leslie Andrea for further assessment.  3. Defer reassessment and recommendations for management of      hypertension to Dr. Leslie Andrea, with whom patient has      maintained regular scheduled followup.  4. If the echocardiogram studies are normal, then patient can return      to [place] on an as-needed basis.      Gene Serpe, PA-C  Electronically Signed      Ernestine Mcmurray, MD,FACC  Electronically Signed   GS/MedQ  DD: 09/02/2007  DT: 09/02/2007  Job #: [id number]   cc:   Elizabeth Sauer, M.D.  Estill Bamberg. Karie Kirks, M.D.

## 2010-08-19 NOTE — Group Therapy Note (Signed)
NAMEKAYSE, PUCCINI        ACCOUNT NO.:  [id number]   MEDICAL RECORD NO.:  [id number]          PATIENT TYPE:  INP   LOCATION:  IC03                          FACILITY:  [facility]   PHYSICIAN:  Estill Bamberg. Karie Kirks, M.D.DATE OF BIRTH:  04/14/1945   DATE OF PROCEDURE:  01/14/2007  DATE OF DISCHARGE:                                 PROGRESS NOTE   SUBJECTIVE:  The patient was admitted to the hospital yesterday with  multiple seizures.  She required intubation, is now off the ventilator.  She has never had seizures before.   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   OBJECTIVE:  Currently she appears to be somewhat sleepy, but she has  recently had Ativan IV.  She is in no acute distress.  She is well-  developed, well-nourished.  Sentence structure appears to be intact.  Sensorium appears to be normal.  HEART:  A regular rhythm and rate of about 70.  LUNGS:  Clear throughout, moving air well.  Pulse is 93, O2 sat was 99%, 40% earlier, but now she is down to nasal  prongs.  ABDOMEN:  Soft but there is tenderness on palpation of the right lower  quadrant.  No edema of the ankles.   ASSESSMENT:  Seizure disorder, question etiology.   PLAN:  Reviewed Dr. Freddie Apley note.  MRI with and without contrast  today.  Discussed with the patient and her son.      Estill Bamberg. Karie Kirks, M.D.  Electronically Signed     SDK/MEDQ  D:  01/14/2007  T:  01/15/2007  Job:  [id number]

## 2010-08-19 NOTE — Group Therapy Note (Signed)
[name], [name]        ACCOUNT NO.:  [id number]   MEDICAL RECORD NO.:  [id number]          PATIENT TYPE:  INP   LOCATION:  A219                          FACILITY:  [facility]   PHYSICIAN:  Edward L. Luan Pulling, M.D.DATE OF BIRTH:  06/26/1945   DATE OF PROCEDURE:  DATE OF DISCHARGE:                                 PROGRESS NOTE   Patient of Dr. Karie Kirks.  [name] was able to be extubated  successfully.  She is awake and alert.  Says that she did not change any  medicines.  She did not discontinue any medicines, and she does not use  any illicit drugs.  The MRI shows a right cerebellar lesion, cannot rule  out mass, but needs a contrasted study.  The defusion study on the MRI  says that the cerebellar lesion was not an acute stroke.   ASSESSMENT:  She has some sort of cerebellar lesion that may have  precipitated her seizure disorder.  She had respiratory failure, which  has resolved.  She is off the ventilator, and at this point I am going  to plan to follow more peripherally.  Thanks for allowing me to see her  with you.  I will, of course, be glad to see her at any time at your  request.      Percell Miller L. Luan Pulling, M.D.  Electronically Signed     ELH/MEDQ  D:  01/15/2007  T:  01/15/2007  Job:  [id number]

## 2010-08-19 NOTE — Procedures (Signed)
[name], [name]        ACCOUNT NO.:  [id number]   MEDICAL RECORD NO.:  [id number]          PATIENT TYPE:  INP   LOCATION:  IC03                          FACILITY:  [facility]   PHYSICIAN:  Kofi A. Merlene Laughter, M.D. DATE OF BIRTH:  1945-05-13   DATE OF PROCEDURE:  02/07/2007  DATE OF DISCHARGE:                              EEG INTERPRETATION   This is a 65 year old lady who presents with multiple seizures.   MEDICATIONS:  1. Maxzide.  2. Lorazepam.  3. Protonix.  4. Ativan.  5. Depakote.  6. Dilantin.   ANALYSES:  A 16 channel recording is conducted for 23 minutes.  There is  a well-formed posterior rhythm of 12 Hz which attenuates with eye  opening.  There is beta activity noted in the frontal areas.  Awake and  sleep activities are recorded.  There are sleep spindles and K-complexes  indicative of stage 2 sleep.  The patient did have one of her spells  during the recording.  The spell lasted for a few seconds and shows a  rhythmic high amplitude activity without clear electrographic evidence  of seizure or clear epileptiform activity.  No postictal slowing is  noted.   IMPRESSION:  This is an unremarkable EEG recording.  There is a high  delta rhythmic activity consistent with rhythmic body movements without  clear electrographic correlates indicating electrographic seizure.  There is no epileptiform activity.      Kofi A. Merlene Laughter, M.D.  Electronically Signed     KAD/MEDQ  D:  02/08/2007  T:  02/08/2007  Job:  [id number]

## 2010-08-19 NOTE — Group Therapy Note (Signed)
NAMEALABAMA, DOIG        ACCOUNT NO.:  [id number]   MEDICAL RECORD NO.:  [id number]          PATIENT TYPE:  INP   LOCATION:  IC03                          [facility]ACILITY:  [facility]   PHYSICIAN:  Estill Bamberg. Karie Kirks, M.D.DATE OF BIRTH:  11-28-1945   DATE OF PROCEDURE:  DATE OF DISCHARGE:  02/08/2007                                 PROGRESS NOTE   SUBJECTIVE:  Very tearful this morning.  She is tired of being in the  hospital.  She feels as if she is going to have seizures and they cannot  be controlled she might as well be at home.   OBJECTIVE:  VITAL SIGNS:  Stable.  Pulse of 98.  She is oriented, alert  and really no acute distress except depressed.  She is well-developed,  well-nourished.  O2 sat is 94% on 2-1/2 liters.  Her lungs are clear throughout, moving air well.  HEART:  A regular rhythm rate of about 70 today.  ABDOMEN:  Soft.  There is no edema of the of the ankles.   Her phenytoin level on admission was 9.9, but went up to 15.1 yesterday  and now down to 6.4 today off phenytoin.  Valproic acid level was 48  yesterday and 45 today.   I have reviewed Dr. Freddie Apley note.  He feels that she is having stress-  induced seizures.  Since she has had two negative EEGs, including one  while she is having a seizure.  Was also note by nursing that she was  able to open her eyes and even talk during a seizure.   Nursing also noted that she seemed to be in V-tach after one seizure.  I  already looked at rhythm strips on her yesterday; it looks like she is  having artifact during one of the seizures, but regular QRS complexes  march through this strip, but there was a time yesterday when that was  less evident.   ASSESSMENT:  1. Seizures, probably stress-induced.  2. Abnormal MRI the brain.   PLAN:  I am asking Cardiology to establish that what we see on the  rhythm strip is artifact or V-tach.  She is now on Depakote 500 mg in  the morning and 1000 mg at night.  If  cardiology feels she is stable,  will discharge her home today.  Also, discussed with nursing.      Estill Bamberg. Karie Kirks, M.D.  Electronically Signed    SDK/MEDQ  D:  02/08/2007  T:  02/08/2007  Job:  [id number]

## 2010-08-19 NOTE — Consult Note (Signed)
NAMEEVYNN, [name]        ACCOUNT NO.:  [id number]   MEDICAL RECORD NO.:  [id number]          PATIENT TYPE:  INP   LOCATION:  IC03                          FACILITY:  [facility]   PHYSICIAN:  Kofi A. Merlene Laughter, M.D. DATE OF BIRTH:  Aug 31, 1945   DATE OF CONSULTATION:  DATE OF DISCHARGE:                                 CONSULTATION   HISTORY OF PRESENT ILLNESS:  This is a 65 year old black female who  collapsed after having what appears to be generalized tonic clonic  seizure.  The patient apparently had a series of these by a total of  about 5-6 in route to the ED department.  The patient was intubated for  respiratory suppression, and she was found to be in some respiratory  failure as a result of her multiple seizures.  She was given IV  antibiotics, although, she has been afebrile.  The patient did have a  urine drug screen that was positive for metabolites with  benzodiazepines, otherwise negative.  There is no previous history of  seizures or head injury.  There is no prior history of CNS infections or  stroke or head trauma.  The patient was filled with Dilantin and has  been afebrile since.   PAST MEDICAL HISTORY:  1. Gastroesophageal reflux disease.  2. Hypertension.   PAST SURGICAL HISTORY:  1. Appendectomy.  2. Cholecystectomy.  3. Hysterectomy.   SOCIAL HISTORY:  No history of alcohol or illicit drug use.  She does  smoke a half pack of cigarettes daily.   ADMISSION MEDICATIONS:  Protonix, Maxzide and benzodiazepines are not on  her list, although, it has been positive on her urine drug screen.   PHYSICAL EXAMINATION:  VITAL SIGNS:  Temperature 97.5.  She is intubated  and is saturating 100% at a rate of 12, blood pressure 155/60, pulse  100.  HEENT:  Neck supple.  Head normocephalic, atraumatic.  ABDOMEN:  Soft.  EXTREMITIES:  No significant edema.  The patient is on propofol, and  examined while on this medication.  NEUROLOGICAL:  She does open her eyes to  sternal rub and moans and  groans at times.  She does not follow commands.  Pupils are reactive to  light.  Coronary reflexes are intact.  Oculocephalic reflexes are  intact.  She grimaces to pain symmetrically.  Motor examination shows  vigorous and purposeful symmetric movements bilaterally to pain stimuli.  Reflexes are symmetric.  Sensation shows response to stimuli equally on  both sides.  Coordination shows no tremors or dysmetria.   EXPLORATIVE DATA:  Urinalysis is negative for nitrites, leukocytes  small, epithelials cells few.  WBC 7-10, WBC blood is 7.8.  Hemoglobin  13.5, platelet count 215.  Sodium 137, potassium 3.1, chloride 98, CO2  31, glucose 119, BUN 17, creatinine 0.7, liver enzymes normal . Calcium  9.1.  Urine drug screen positive for metabolite of benzodiazepine.   ASSESSMENT STATUS:  Epilepticus that his resolved/resolving at this  time.  The etiology is somewhat elusive.  She does not appear to be  toxic or have any primary CNS infection clinically.  The presence of  benzodiazepine raises the issue  of benzodiazepine withdrawal seizures  which probably may be the most likely etiology.   RECOMMENDATIONS:  Agree dilantin load IV.  Will switch her to around the  clock 100 mg t.i.d.  EEG, MRI of the brain once the patient has been  weaned off and extubated from the ventilator.  Further suggestions will  depend on how the patient does.   Thank you for this consultation.      Kofi A. Merlene Laughter, M.D.  Electronically Signed     KAD/MEDQ  D:  01/15/2007  T:  01/15/2007  Job:  [id number]

## 2010-08-19 NOTE — H&P (Signed)
[name], [name]        ACCOUNT NO.:  [id number]   MEDICAL RECORD NO.:  [id number]          PATIENT TYPE:  INP   LOCATION:  IC03                          FACILITY:  [facility]   PHYSICIAN:  Estill Bamberg. Karie Kirks, M.D.DATE OF BIRTH:  [birth year]   DATE OF ADMISSION:  02/06/2007  DATE OF DISCHARGE:  LH                              HISTORY & PHYSICAL   HISTORY OF PRESENT ILLNESS:  This 65 year old was admitted to the  hospital with new-onset seizure disorder.  Seizures were stabilized and  she was discharged home on Dilantin extended 300 mg nightly and seemed  to be doing well when seen in the office last week.  Unfortunately,  about 2 days ago she began developing seizures and these became somewhat  worse by today, so family called EMS to bring her to the hospital.   She had a followup workup in the hospital.  She had an abnormal MRI of  the brain, but was felt that this did not cause her seizures.  She was  seen by Dr. Merlene Laughter, neurologist, at that time.   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   MEDICATIONS:  1. Pantoprazole 40 mg daily for GERD.  2. Triamterene/hydrochlorothiazide daily for hypertension.  3. Lorazepam 0.5 t.i.d. for anxiety.   PHYSICAL EXAMINATION:  GENERAL:  Admission exam in the ER showed the  patient was having seizures, in fact, had had 3 or 4 seizures by the  time I came to see her.  She had a seizure while I was examining her.  VITAL SIGNS:  Temperature was 99.1, pulse 111, blood pressure 144/61,  respiratory rate 16.  MENTAL STATUS:  She knew her son and husband at the time I was talking  to her.  She thought she was still in the ambulance, however.  LUNGS:  Clear throughout.  HEART:  Regular rhythm, rate of 70.  NEUROLOGIC:  Speech appeared to be normal when she was speaking; there  was no slurring of her speech.  Sentence structure intact and sensorium  appeared to be normal.  ABDOMEN:  Soft without  hepatosplenomegaly or mass.  There was no edema  of the ankles.   EMERGENCY DEPARTMENT COURSE:  She had a seizure while I was watching  her; this was grand mal and lasted about 10 seconds.  Shortly after  this, she began coughing and had congestion.  No wheezing was noted the  lungs.   Her Dilantin level has come back at 9.9.   DIAGNOSES:  1. Status epilepticus.  2. Essential hypertension.  3. Gastroesophageal reflux disease.  4. Anxiety.   PLAN:  Plan is to admit her to the hospital.  She is given Cerebyx 100  mg IV.  We will start her on phenobarbital 30 mg b.i.d. and continue the  Dilantin extended 300 mg nightly.  I am asking Dr. Merlene Laughter to see her  and reassess her seizure disorder.      Estill Bamberg. Karie Kirks, M.D.  Electronically Signed     SDK/MEDQ  D:  02/06/2007  T:  02/07/2007  Job:  [id number]

## 2010-08-19 NOTE — Discharge Summary (Signed)
[name], [name]        ACCOUNT NO.:  [id number]   MEDICAL RECORD NO.:  [id number]          PATIENT TYPE:  INP   LOCATION:  IC03                          FACILITY:  [facility]   PHYSICIAN:  Estill Bamberg. Karie Kirks, M.D.DATE OF BIRTH:  1946-02-03   DATE OF ADMISSION:  02/06/2007  DATE OF DISCHARGE:  11/04/2008LH                               DISCHARGE SUMMARY   HISTORY OF PRESENT ILLNESS:  This 65 year old woman was admitted to the  hospital in status epilepticus.  She had been having seizures for two  days prior to admission.  In the emergency room she had multiple  seizures, each one lasting 10-15 seconds and involving generalized  shaking all over from head down to the abdomen and feet, and ending in  coughing spells afterwards.   HOSPITAL COURSE:  She had a benign three day hospitalization from  02/06/07 to 02/08/07.  Vital signs remained stable.  Blood pressure  dropped some during her hospitalization.   LABORATORY DATA:  Admission CBC was normal as was her BMP.  Admission  Dilantin level was 9.9, recheck the next day after fosphenytoin IV was  15.1 and day of discharge off fosphenytoin 6.4. Valproic acid level on  her second day was 48.0, recheck the day of discharge was 45.0.   She was continued on pantoprazole 40 mg daily, triamterene-HCTZ 37.5/25  daily, Lorazepam 1 mg t.i.d., p.r.n.  She was started on phenobarbital  30 mg b.i.d. which was started on her second day.  She was given IV  normal saline at 35 ml/hr.  Lorazepam 1 mg IV every 4 hours as needed  for seizures.  The HCTZ was held her second day due to low blood  pressure.   The patient was seen in consult by Dr. Merlene Laughter, Neurologist, for these  questioned seizures.  He actually observed her while she was having a  seizure.  She also had had two EEG's, the second of which was done  while she had a seizure. Neither one showed seizure-like activity.  Seizures are somewhat atypical in that she could have her eyes open  and  actually be talking during the seizure.  They also lasted a very short  amount of time, 10-15 seconds at most.   On the second day IV Dilantin and Depakote were discontinued.  She was  put on Depakote 500 mg a.m. and 1000 mg at h.s.  She was also given  Lexapro 10 mg daily, clonazepam 0.5 mg b.i.d.   During two of the seizures she was on the monitor and the first one she  had clonic irregularity but QRS waves were marching out normally, so it  was felt that she just had artifact.  The second time she was having a  seizure.  It was felt by nursing that she might have V-tach.  Dr.  Lattie Haw of Cardiology was called and he reviewed the strip and felt  that she did not have V-tach and this was just artifact.   The patient seemed to be much better the third day.  She was crying,  sad, ready to be discharged home.   FINAL DIAGNOSES:  1. Seizures.  2. Depression.  3. Anxiety.  4. Benign essential hypertension.  5. Gastroesophageal reflux disease.   DISCHARGE MEDICATIONS:  1. Depakote 500 mg a.m. and 2 tablets h.s. (#100 with 2 refills).  2. Lexapro 10 mg daily (#30 with 2 refills).  3. Clonazepam 0.5 mg b.i.d. (#60 with 2 refills).   FOLLOWUP:  Dr. Freddie Apley office in two days and my office next week.   In the meantime she will be off work.      Estill Bamberg. Karie Kirks, M.D.  Electronically Signed     SDK/MEDQ  D:  02/08/2007  T:  02/09/2007  Job:  [id number]   cc:   Estill Bamberg. Karie Kirks, M.D.  Fax: [phone]

## 2010-08-19 NOTE — Discharge Summary (Signed)
[name], [name]        ACCOUNT NO.:  [id number]   MEDICAL RECORD NO.:  [id number]          PATIENT TYPE:  INP   LOCATION:  3012                         FACILITY:  [facility]   PHYSICIAN:  Elizabeth Sauer, M.D.      DATE OF BIRTH:  10/11/1945   DATE OF ADMISSION:  11/15/2007  DATE OF DISCHARGE:  11/18/2007                               DISCHARGE SUMMARY   ADMITTING DIAGNOSIS:  Spondylosis, L3-L4, L4-L5.   DISCHARGE DIAGNOSIS:  Spondylosis, L3-L4, L4-L5.   PROCEDURE:  L3-L4, L4-L5, and L5-S1 TLIF, segmental pedicle screw  fixation, and posterolateral arthrodesis.   SURGEON:  Elizabeth Sauer, MD   COMPLICATION:  None.   DISCHARGE STATUS:  Doing well.   BODY OF TEXT:  This is a 65 year old right-handed black lady whose  history and physical is recounted in the chart.  She has had a foraminal  diskectomy done, has had progressive degenerative change, and was  admitted for fusion.  General exam was intact.  The neurologic exam was  intact with intractable back and leg pain.  She was admitted after  ascertaining normal laboratory values and underwent a TLIF of L3-L4, L4-  L5, and L5-S1.  Postoperatively, she has done extremely well and her leg  pain has gone.  Her strength is full.  She is eating and voiding  normally.  She participated in physical therapy and did well with that.  She is now out on the floor, facile with her activities of daily living  and wanted to go home.  Her incisions are now dry and well-healing.  She  is being sent home with Vicodin for pain and with little bit of  ciprofloxacin for skin coverage.  Her followup will be in the [facility]  offices in a week for suture removal.           ______________________________  Elizabeth Sauer, M.D.     MWR/MEDQ  D:  11/18/2007  T:  11/18/2007  Job:  [id number]

## 2010-08-19 NOTE — Consult Note (Signed)
[name], [name]        ACCOUNT NO.:  [id number]   MEDICAL RECORD NO.:  [id number]          PATIENT TYPE:  INP   LOCATION:  IC03                          FACILITY:  [facility]   PHYSICIAN:  Edward L. Luan Pulling, M.D.DATE OF BIRTH:  03-22-1946   DATE OF CONSULTATION:  01/13/2007  DATE OF DISCHARGE:                                 CONSULTATION   Patient of Drs. Knowlton and Dr. Everette Rank.  Dr. Everette Rank is admitting now  for Dr. Karie Kirks.   [name] is a 65 year old who came to the emergency room because of  seizures.  The EMS service was called out to see her because she was  short of breath.  They found her to be breathing somewhat slowly, placed  her on oxygen, and she had a seizure at the home.  Then she four more  seizures on the way.  She is now intubated, on a ventilator.  There is  no family present.   She has a past medical history of hypertension.  Apparently no known  seizure disorder in the past.  She has had a ruptured disk on the left  and has had some chronic low back pain.  She has a history of  hypertension.  She has a history of multiple abdominal surgeries  including cholecystectomy, partial gastrectomy, bowel obstruction.  This  is from the old record.   SOCIAL HISTORY:  That she smokes about a fourth to one-half pack of  cigarettes daily.  She does not drink any alcohol, does not use any  illicit drugs.   FAMILY HISTORY:  Both parents are deceased from complications of  hypertension.   REVIEW OF SYSTEMS:  Really unable to be assessed at this point.   PHYSICAL EXAMINATION:  Blood pressure initially 200/100, pulse 69,  respirations 10, O2 saturation 99%.  Her pupils are reactive.  She is intubated.  Her nose and throat are  clear.  CHEST:  Some rhonchi.  HEART:  Regular without murmur, gallop or rub.  ABDOMEN:  Soft.  No masses are felt.  EXTREMITIES:  No edema.  CENTRAL NERVOUS SYSTEM:  She is sedated, so it is difficult to assess.   LABORATORY WORK:   White count 8500, hemoglobin 13.8, platelets 269.  Blood cultures, of course, are pending.  Comprehensive metabolic  profile:  BUN is 24, creatinine 0.6, glucose 110.  Electrolytes were  normal.  Urine essentially negative.  Blood gas on 100% O2, tidal volume  of 500, rate of 12, pH 7.40, pCO2 of 40, pO2 of 310.  Chest x-ray:  Some  chronic changes.  No pneumonia.   ASSESSMENT:  She has apparently had a seizure and it is not at all clear  what happened.  She is going to need further evaluation, which is  underway, and I will follow from a ventilator point of view.  We can go  ahead and reduce her FIO2 at this point.  I think the Diprivan is  probably a good idea.  She is going to get Dilantin as well.      Edward L. Luan Pulling, M.D.  Electronically Signed     ELH/MEDQ  D:  01/13/2007  T:  01/14/2007  Job:  [id number]

## 2010-08-19 NOTE — Discharge Summary (Signed)
NAMEALEXANDRE, LIGHTSEY        ACCOUNT NO.:  [id number]   MEDICAL RECORD NO.:  [id number]          PATIENT TYPE:  INP   LOCATION:  A219                          FACILITY:  [facility]   PHYSICIAN:  Estill Bamberg. Karie Kirks, M.D.DATE OF BIRTH:  1945-06-15   DATE OF ADMISSION:  01/13/2007  DATE OF DISCHARGE:  10/14/2008LH                               DISCHARGE SUMMARY   HISTORY:  This 65 year old woman who was admitted to the hospital for  first-time seizures.   HOSPITAL COURSE:  She had a benign six-day hospitalization extending  from January 13, 2007, to January 18, 2007.  Her vital signs remained  stable.   LABORATORY DATA:  Her admission CBC and CMP were essentially normal  except for a glucose mildly elevated at 110 and a BUN of 24, creatinine  0.67.  Urinalysis was negative.  ABGs on the ventilator and 5 of PEEP  showed a pH of 7.4, pCO2 of 40, pO2 310.  Cholesterol was 245,  triglycerides 117.  ABGs were followed while on the vent.  Flat level  was 10.6, recheck was 10.3.  Recheck CBC and MET-7 were both normal.  Urine cultures showed no growth.  Two blood cultures, one showing no  growth and the other grew out diphtheroid (carmini bacterium species).   Admission chest x-ray showed some chronic changes.  There was felt to be  minimal pulmonary vascular congestion.  A CT of the head non-contrast  was negative.  An MRI of the brain with and without contrast showed  multiple white matter lesions, at least one of which is in the right  cerebellum, without evidence of acute intracranial findings; could the  patient have multiple sclerosis?  Further considerations included small  vessel disease, hypertension or diabetes.  There was no acute plaque  activity, no evidence of acute stroke and is age-appropriate atrophy.   She was admitted to the hospital and in fact required intubation after  having a respiratory arrest.  An electroencephalogram was done.  This  showed excessive spindling.   It was felt this could come from  benzodiazepines.  In the hospital she was given IV fluids, a gram of  Rocephin daily, Zithromax 500 mg daily and Ativan 1 mg p.r.n. seizure  activity IV.  In short order she was able to be weaned from the  ventilator.  She was given Ambien 10 mg q.h.s. for sleep, hydrocodone  5/500 mg four times daily for pain.  The Zithromax and Rocephin were  stopped on her fifth day and she was switched to Ceftin 500 mg twice  daily and then given Zofran for nausea.   She was seen by Dr. Luan Pulling, pulmonary, who helped with the ventilator  and also by Dr. Trey Sailors A. Sullivan, neurologist, who reviewed her  seizures.  The patient did well on Dilantin with good Dilantin levels,  and no further seizures while in the hospital.   While here, however, she gave me the history that her 30 year old  brother had developed idiopathic seizures 10 years ago.  There has never  been a cause for these.  He was doing well on seizure medications.   By  the patient's sixth day she was doing well, stable, with no further  seizures and ready for discharge home.  At that time I recommended that  she did not drive.  I told her she could see her neurologist in  [place], Dr. Lennon Alstrom.  I briefly went over the [facility] on seizures and driving.  I felt that she could go to work since  she works doing things that I do not think would be dangerous to her if  she did have a seizure.  To get to work, however, she will need to be  driven by somebody else.   DISCHARGE MEDICATIONS:  1. She is given a prescription for Dilantin 300 mg q.h.s., brand name      only, as recommended by Dr. Merlene Laughter (#30 with 11 refills).  2. She is to continue with hydrochlorothiazide/Maxzide 37.5/25 mg      daily.  3. Protonix 40 mg daily.  4. Hydrocodone p.r.n. pain.   FOLLOWUP:  Followup will be in our office in one week and with her  neurologist in [place], Dr. Erling Cruz as soon as she can see  him.   DISCHARGE DIAGNOSES:  1. Idiopathic seizure disorder.  2. 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  3. Benign essential hypertension.  4. Gastroesophageal reflux disease.      Estill Bamberg. Karie Kirks, M.D.  Electronically Signed     SDK/MEDQ  D:  01/18/2007  T:  01/19/2007  Job:  [id number]

## 2010-08-22 NOTE — Op Note (Signed)
NAME:  [name], [name]                  ACCOUNT NO.:  [id number]   MEDICAL RECORD NO.:  [id number]                   PATIENT TYPE:  AMB   LOCATION:  DAY                                  FACILITY:  [facility]   PHYSICIAN:  R. Garfield Cornea, M.D.              DATE OF BIRTH:  Feb 04, 1946   DATE OF PROCEDURE:  05/18/2003  DATE OF DISCHARGE:                                 OPERATIVE REPORT   PROCEDURE:  Attempted colonoscopy followed by EGD with biopsy.   INDICATIONS FOR PROCEDURE:  The patient is a 65 year old lady sent over by  the courtesy of Dr. Lemmie Evens for colorectal cancer screening.  She  tells me she had a colonoscopy back in the 1970s at [facility] for reasons which  are not clear.  She does not recall any significant findings.  She has a  history of bowel obstruction and had some type of gastric surgery  previously.  No family history of colorectal neoplasia.  She has no lower GI  tract symptoms.  She is here for screening colonoscopy.  Also, she reports  intermittent nausea, vomiting, and intermittent epigastric pain and would  like to have her upper GI tract evaluated too.  We will offer her both an  EGD and colonoscopy today.  This approach has been discussed with the  patient at length.  The potential risks, benefits, and alternatives have  been reviewed.  Please see the documentation in the medical record.   PROCEDURE:  O2 saturation, blood pressure, pulses, and respirations were  monitored throughout the entirety of both procedures.  Conscious sedation  was with Versed 6 mg IV, Demerol 100 mg IV in divided doses.  The instrument  used was the Olympus video chip pediatric colonoscope, adult colonoscope,  and also the adult gastroscope.   COLONOSCOPY FINDINGS:  Digital rectal examination revealed no abnormalities.   ENDOSCOPIC FINDINGS:  The prep was adequate.   Rectum:  Examination of the rectal mucosa including retroflex view of the  anal verge revealed only internal  hemorrhoids.   Colon:  The colonic mucosa was seen up to 40 cm. There were problems with  the air insufflator button on the pediatric colonoscope.  It was withdrawn.  Subsequently, the adult colonoscope was obtained, and the colon was easily  intubated up to 40 cm.  However, at 40 cm, I ran into noncompliance of the  sigmoid colon.  I ran into acute resistance despite external abdominal  pressure and changing of the patient's position.  I was not able to advance  the scope beyond here.  There was no looping, simply a very noncompliant  colon at this level.  The mucosa at 40 cm appeared normal.  From this level,  the scope was pulled back.  All previously mentioned mucosal surfaces  appeared normal.  The patient tolerated the attempt well and was prepared  for EGD.   EGD FINDINGS:  Examination of the tubular esophagus revealed no mucosal  abnormalities.  The EG junction was easily traversed.   Stomach:  The gastric cavity was empty and insufflated well with air.  It  had been surgically altered.  I identified the anastomosis with the small  bowel and only one outlet consistent with Billroth I configuration.  There  were adenomatous-appearing changes of the gastric anastomosis.  Please see  photos.  Nothing was clearly neoplastic.  No erosion or ulcer crater was  seen.  Examination of the proximal stomach via retroflexion revealed no  abnormalities.  The abnormal-appearing gastric mucosa at the anastomosis was  biopsied.  Also, there were a couple of small whitish, cauliflower-looking  erosions more proximal in the residual gastric mucosa which were also  biopsied separately.   The patient tolerated both procedures well and was reactive in endoscopy.   COLONOSCOPY IMPRESSION:  1. Incomplete colonoscopy, as described above.  2. Internal hemorrhoids.  Otherwise, normal rectum.  Normal colonic mucosa     to 40 cm.   EGD IMPRESSION:  1. Normal esophagus.  2. Status post prior  hemigastrectomy, Billroth I configuration.  Patent     efferent limb.  Adenomatous-appearing mucosa at the anastomosis,     biopsied.  Sunflower-appearing erosions in the more proximal stomach of     uncertain significance, biopsied separately.  3. I am unsure whether or not these findings have any clinical significance     or have anything to do with the patient's intermittent nausea, vomiting,     and epigastric pain.   RECOMMENDATIONS:  1. Will check LFTs, amylase, and CBC today.  2. Proceed with abdominal ultrasound as well as an air-contrast barium enema     next week to image the more proximal colon not seen today.  3. Further recommendations to follow.      ___________________________________________                                            Bridgette Habermann, M.D.   RMR/MEDQ  D:  05/18/2003  T:  05/18/2003  Job:  [id number]   cc:   Estill Bamberg. Karie Kirks, M.D.  [address] [place], [place] [address]  Fax: [phone]

## 2010-08-22 NOTE — Op Note (Signed)
NAMECALLAHAN, PEDDIE        ACCOUNT NO.:  [id number]   MEDICAL RECORD NO.:  [id number]          PATIENT TYPE:  OIB   LOCATION:  3172                         FACILITY:  [facility]   PHYSICIAN:  Elizabeth Sauer, M.D.      DATE OF BIRTH:  [birth year]   DATE OF PROCEDURE:  04/02/2006  DATE OF DISCHARGE:                               OPERATIVE REPORT   PREOPERATIVE DIAGNOSIS:  Foraminal disk on the left side at L3-4.   POSTOPERATIVE DIAGNOSIS:  Foraminal disk on the left side at L3-4.   PROCEDURES:  L3-4 foraminal diskectomy.   SURGEON:  Elizabeth Sauer, M.D.   ANESTHESIA:  General endotracheal.  Prepped with sterile Betadine and  scrubbed with alcohol wipe.   COMPLICATIONS:  None.   NURSE ASSISTANT:  [place]   DESCRIPTION OF PROCEDURE:  A 65 year old right-handed black lady with a  foraminal disk on the left side L3-4 and L3 radiculopathy, taken to  operating room smoothly anesthetized and intubated, placed prone on the  operating table.  Following shave, prep, draped in usual sterile  fashion.  Skin was infiltrated with 1% lidocaine with 1:400,000  epinephrine. Skin incision was made directly over the L3 lamina.  The  lamina was exposed in subperiosteal plane over the facet joints and the  lateral margin of the pars. Intraoperative x-ray confirmed correctness  level. Using a high-speed drill the inferolateral portion of the pars  interarticularis and the superior portion of the L3-4 facet joint were  resected down to ligamentum flavum that was removed in a retrograde  fashion.  This exposed the dorsal root ganglion on the left side.  Following this exposure.  Careful dissection out the nerve root revealed  the foraminal disk.  The annular fibers were divided and the offending  fragment was removed.  The disk was carefully explored.  All graspable  fragments removed.  The neural foramen was carefully explored and found  to be open.  The wound was irrigated.  Hemostasis assured.   Depo-Medrol  soaked fat was used to cover the bony defect and bathe the dorsal root  ganglion.  Successive layers of 0 Vicryl, 2-0 Vicryl, 3-0 nylon were  used to close.  Betadine Telfa dressing was applied and the patient  returned recovery room in good condition.           ______________________________  Elizabeth Sauer, M.D.     MWR/MEDQ  D:  04/02/2006  T:  04/02/2006  Job:  [id number]

## 2010-08-22 NOTE — H&P (Signed)
NAMESONNIE, BIAS        ACCOUNT NO.:  [id number]   MEDICAL RECORD NO.:  [id number]          PATIENT TYPE:  OIB   LOCATION:  3716                         FACILITY:  [facility]   PHYSICIAN:  Elizabeth Sauer, M.D.      DATE OF BIRTH:  [birth year]   DATE OF ADMISSION:  04/02/2006  DATE OF DISCHARGE:                              HISTORY & PHYSICAL   ADMITTING DIAGNOSIS:  Foraminal disk on the left side at L3-4.   This is a now 65 year old, right-handed black lady, who I saw a couple  of years ago for a spontaneous femoral neuropathy that resolved.  She  did well.  She came back to my office in early November with pain in her  back, down her left leg which she said she got at work.  MRI was  obtained that demonstrates a foraminal disk at 3-4 on the left and she  is now admitted for foraminal diskectomy.   MEDICAL HISTORY:  Remarkable for a little bit of hypertension for which  she is on:  1. Hydrochlorothiazide 25 mg a day.  2. Evista 60 mg a day.  3. Ativan on a p.r.n. basis.   SHE HAS NO ALLERGIES.   SURGICAL HISTORY:  Numerous abdominal operations because of  cholecystectomy and partial gastrectomy and bowel obstructions; these  were in the 70s.   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   FAMILY HISTORY:  Mother and father are both dead.  There is a history of  hypertension in the family.   REVIEW OF SYSTEMS:  Remarkable for hypertension, wearing glasses, leg  pain while walking.   Her HEENT exam is within normal limits.  She has good range of motion in  her neck.  Chest:  Clear.  Cardiac exam is regular rate and rhythm.  Abdomen is nontender with no hepatosplenomegaly.  Extremities are  without clubbing or cyanosis.  GU exam is deferred.  Peripheral pulses  are good.  Neurologically:  She is awake, alert, and oriented.  Her  pupils are equal, round, and reactive to light.   Her extraocular  movements are intact.  Facial movements and sensation are intact.  Shoulder shrug is normal.  She describes some  swallowing difficulties  and tongue protrudes in the midline.  Motor exam shows 5/5 strength  throughout the upper and lower extremities save for the knee extensors  on the left side which are 5-/5.  She says her knee tends to buckle on  occasion.  Left knee jerk is absent.  Right knee jerk and ankle jerks  are intact.  Straight leg is very positive on the left.   MR shows the foraminal disk on the left side at L3-4.   CLINIC IMPRESSION:  Left L3 and L4 radiculopathy secondary to foraminal  disk.  The plan is for lumbar diskectomy in the foraminal position at L3-  4.  This will be done on the left side.  The risks and benefits of this  approach have been discussed with her and  she wishes to proceed.           ______________________________  Elizabeth Sauer, M.D.     MWR/MEDQ  D:  04/02/2006  T:  04/02/2006  Job:  [id number]

## 2010-09-08 ENCOUNTER — Other Ambulatory Visit (HOSPITAL_COMMUNITY): Payer: Self-pay | Admitting: Family Medicine

## 2010-09-08 DIAGNOSIS — Z139 Encounter for screening, unspecified: Secondary | ICD-10-CM

## 2010-09-15 ENCOUNTER — Ambulatory Visit (HOSPITAL_COMMUNITY)
Admission: RE | Admit: 2010-09-15 | Discharge: 2010-09-15 | Disposition: A | Discharge status: Home / Self Care | Payer: Medicare Other | Source: Ambulatory Visit | Attending: Family Medicine | Admitting: Family Medicine

## 2010-09-15 DIAGNOSIS — Z139 Encounter for screening, unspecified: Secondary | ICD-10-CM

## 2010-09-15 DIAGNOSIS — Z1231 Encounter for screening mammogram for malignant neoplasm of breast: Secondary | ICD-10-CM | POA: Insufficient documentation

## 2010-12-31 LAB — CBC
HCT: 42.4
Hemoglobin: 14.2
MCHC: 33.5
MCV: 84.8
RBC: 5
WBC: 5.1

## 2010-12-31 LAB — DIFFERENTIAL
Basophils Absolute: 0
Eosinophils Relative: 1
Lymphocytes Relative: 33
Lymphs Abs: 1.7
Neutrophils Relative %: 63

## 2010-12-31 LAB — COMPREHENSIVE METABOLIC PANEL
AST: 334 — ABNORMAL HIGH
BUN: 5 — ABNORMAL LOW
CO2: 27
Calcium: 9.3
Chloride: 106
Creatinine, Ser: 0.64
GFR calc non Af Amer: >60
Glucose, Bld: 79
Total Bilirubin: 0.5

## 2010-12-31 LAB — URINALYSIS, ROUTINE W REFLEX MICROSCOPIC
Bilirubin Urine: NEGATIVE
Glucose, UA: NEGATIVE
Hgb urine dipstick: NEGATIVE
Protein, ur: NEGATIVE
Specific Gravity, Urine: 1.004 — ABNORMAL LOW

## 2010-12-31 LAB — PROTIME-INR
INR: 0.8
Prothrombin Time: 11.7

## 2010-12-31 LAB — TYPE AND SCREEN: ABO/RH(D): B POS

## 2010-12-31 LAB — ALT: ALT: 199 — ABNORMAL HIGH

## 2010-12-31 LAB — ABO/RH: ABO/RH(D): B POS

## 2011-01-02 LAB — DIFFERENTIAL
Basophils Absolute: 0
Eosinophils Relative: 1
Lymphocytes Relative: 30
Lymphs Abs: 2.3
Neutro Abs: 4.9
Neutrophils Relative %: 65

## 2011-01-02 LAB — CBC
HCT: 44.1
MCHC: 32.7
MCV: 84.9
RBC: 5.19 — ABNORMAL HIGH
WBC: 7.4

## 2011-01-02 LAB — COMPREHENSIVE METABOLIC PANEL
BUN: 8
CO2: 28
Calcium: 9.5
Chloride: 102
Creatinine, Ser: 0.56
GFR calc non Af Amer: >60
Glucose, Bld: 84
Total Bilirubin: 1

## 2011-01-02 LAB — GLUCOSE, CAPILLARY: Glucose-Capillary: 129 — ABNORMAL HIGH

## 2011-01-02 LAB — URINALYSIS, ROUTINE W REFLEX MICROSCOPIC
Glucose, UA: NEGATIVE
Ketones, ur: NEGATIVE
Protein, ur: NEGATIVE
Urobilinogen, UA: 0.2

## 2011-01-02 LAB — PROTIME-INR
INR: 0.9
Prothrombin Time: 12.4

## 2011-01-02 LAB — APTT: aPTT: 35

## 2011-01-13 LAB — DIFFERENTIAL
Eosinophils Absolute: 0.1
Eosinophils Relative: 1
Lymphs Abs: 2.5
Monocytes Relative: 4

## 2011-01-13 LAB — CBC
HCT: 41.3
MCV: 84.2
Platelets: 270
RBC: 4.91
WBC: 7.1

## 2011-01-13 LAB — BASIC METABOLIC PANEL
BUN: 14
CO2: 30
Chloride: 101
Potassium: 3.7

## 2011-01-13 LAB — PHENYTOIN LEVEL, TOTAL: Phenytoin Lvl: 6.4 — ABNORMAL LOW

## 2011-01-13 LAB — VALPROIC ACID LEVEL: Valproic Acid Lvl: 45 — ABNORMAL LOW

## 2011-01-15 LAB — DIFFERENTIAL
Basophils Relative: 1
Eosinophils Absolute: 0.1
Eosinophils Absolute: 0.1
Eosinophils Relative: 1
Eosinophils Relative: 1
Lymphs Abs: 3.4 — ABNORMAL HIGH
Monocytes Relative: 4
Neutrophils Relative %: 58

## 2011-01-15 LAB — BLOOD GAS, ARTERIAL
Acid-Base Excess: 0.5
Acid-Base Excess: 1.6
Bicarbonate: 25.1 — ABNORMAL HIGH
MECHVT: 500
MECHVT: 500
O2 Content: 1
O2 Saturation: 97.1
O2 Saturation: 99.4
PEEP: 5
Patient temperature: 37
Patient temperature: 37
RATE: 12
TCO2: 22.8
pH, Arterial: 7.4
pH, Arterial: 7.443 — ABNORMAL HIGH
pO2, Arterial: 163 — ABNORMAL HIGH

## 2011-01-15 LAB — CBC
HCT: 36
MCHC: 33.6
MCHC: 33.8
MCV: 83.6
Platelets: 233
RBC: 4.96
RDW: 15.4 — ABNORMAL HIGH

## 2011-01-15 LAB — CULTURE, BLOOD (ROUTINE X 2)

## 2011-01-15 LAB — B-NATRIURETIC PEPTIDE (CONVERTED LAB): Pro B Natriuretic peptide (BNP): <30

## 2011-01-15 LAB — URINALYSIS, ROUTINE W REFLEX MICROSCOPIC
Bilirubin Urine: NEGATIVE
Hgb urine dipstick: NEGATIVE
Ketones, ur: NEGATIVE
Protein, ur: NEGATIVE
Urobilinogen, UA: 0.2

## 2011-01-15 LAB — RAPID URINE DRUG SCREEN, HOSP PERFORMED
Cocaine: NOT DETECTED
Opiates: NOT DETECTED

## 2011-01-15 LAB — BASIC METABOLIC PANEL
BUN: 13
CO2: 30
Chloride: 103
Creatinine, Ser: 0.73
Glucose, Bld: 91

## 2011-01-15 LAB — COMPREHENSIVE METABOLIC PANEL
ALT: 14
AST: 16
CO2: 25
Calcium: 9.2
GFR calc Af Amer: >60
GFR calc non Af Amer: >60
Potassium: 3.7
Sodium: 137
Total Protein: 7.6

## 2011-01-15 LAB — PHENYTOIN LEVEL, TOTAL
Phenytoin Lvl: 10.3
Phenytoin Lvl: 10.6

## 2011-01-15 LAB — TRIGLYCERIDES: Triglycerides: 117

## 2011-01-15 LAB — CHOLESTEROL, TOTAL: Cholesterol: 245 — ABNORMAL HIGH

## 2011-01-15 LAB — URINE CULTURE

## 2011-01-19 LAB — DIFFERENTIAL
Eosinophils Absolute: 0.1
Lymphocytes Relative: 43
Lymphs Abs: 3.3
Monocytes Relative: 6
Neutrophils Relative %: 50

## 2011-01-19 LAB — COMPREHENSIVE METABOLIC PANEL
ALT: 14
AST: 15
Calcium: 9.1
Creatinine, Ser: 0.78
GFR calc Af Amer: >60
Glucose, Bld: 119 — ABNORMAL HIGH
Sodium: 136
Total Protein: 7.1

## 2011-01-19 LAB — CBC
MCHC: 33.2
MCV: 84.4
RDW: 13.5

## 2011-01-19 LAB — URINALYSIS, ROUTINE W REFLEX MICROSCOPIC
Glucose, UA: NEGATIVE
Hgb urine dipstick: NEGATIVE
Nitrite: NEGATIVE

## 2011-01-19 LAB — LIPASE, BLOOD: Lipase: 19

## 2011-01-19 LAB — URINE MICROSCOPIC-ADD ON

## 2011-03-09 ENCOUNTER — Encounter (HOSPITAL_COMMUNITY): Payer: Self-pay | Admitting: *Deleted

## 2011-03-09 ENCOUNTER — Emergency Department (HOSPITAL_COMMUNITY)
Admission: EM | Admit: 2011-03-09 | Discharge: 2011-03-10 | Disposition: A | Discharge status: Home / Self Care | Payer: Medicare Other | Attending: Emergency Medicine | Admitting: Emergency Medicine

## 2011-03-09 DIAGNOSIS — Z9889 Other specified postprocedural states: Secondary | ICD-10-CM | POA: Insufficient documentation

## 2011-03-09 DIAGNOSIS — I10 Essential (primary) hypertension: Secondary | ICD-10-CM | POA: Insufficient documentation

## 2011-03-09 DIAGNOSIS — Z9079 Acquired absence of other genital organ(s): Secondary | ICD-10-CM | POA: Insufficient documentation

## 2011-03-09 DIAGNOSIS — F17200 Nicotine dependence, unspecified, uncomplicated: Secondary | ICD-10-CM | POA: Insufficient documentation

## 2011-03-09 DIAGNOSIS — G500 Trigeminal neuralgia: Secondary | ICD-10-CM

## 2011-03-09 LAB — SEDIMENTATION RATE: Sed Rate: 6 mm/hr (ref 0–22)

## 2011-03-09 MED ORDER — DEXAMETHASONE SODIUM PHOSPHATE 4 MG/ML IJ SOLN
10.0000 mg | Freq: Once | INTRAMUSCULAR | Status: AC
  Administered 2011-03-09: 10 mg via INTRAVENOUS
  Filled 2011-03-09: qty 3

## 2011-03-09 MED ORDER — DIPHENHYDRAMINE HCL 25 MG PO CAPS
25.0000 mg | ORAL_CAPSULE | Freq: Once | ORAL | Status: AC
  Administered 2011-03-09: 25 mg via ORAL
  Filled 2011-03-09: qty 1

## 2011-03-09 MED ORDER — CARBAMAZEPINE 200 MG PO TABS
100.0000 mg | ORAL_TABLET | Freq: Once | ORAL | Status: AC
  Administered 2011-03-09: 100 mg via ORAL
  Filled 2011-03-09: qty 1

## 2011-03-09 MED ORDER — METOCLOPRAMIDE HCL 5 MG/ML IJ SOLN
10.0000 mg | Freq: Once | INTRAMUSCULAR | Status: AC
  Administered 2011-03-09: 10 mg via INTRAVENOUS
  Filled 2011-03-09: qty 2

## 2011-03-09 MED ORDER — CARBAMAZEPINE 100 MG PO CHEW: CHEWABLE_TABLET | ORAL | Status: DC

## 2011-03-09 MED ORDER — KETOROLAC TROMETHAMINE 30 MG/ML IJ SOLN: 15.0000 mg | Freq: Once | INTRAMUSCULAR | Status: DC

## 2011-03-09 NOTE — ED Provider Notes (Signed)
History   This chart was scribed for [name]. Dorna Mai, MD by Kathreen Cornfield. The patient was seen in room APA09/APA09 and the patient's care was started at 7:08PM  CSN: [id number] Arrival date & time: 03/09/2011  6:36 PM   First MD Initiated Contact with Patient 03/09/11 1900      Chief Complaint  Patient presents with  . Headache    (Consider location/radiation/quality/duration/timing/severity/associated sxs/prior treatment) HPI  Carlo MAGDA MUISE is a 65 y.o. female who presents to the Emergency Department complaining of waxing and waning moderate to severe headache which began a month ago with associated posterior neck pain. Patient states pain will intermittently intensify and will shoot from posterior aspect of neck to left temporal region which will cause her to have infrequent syncopal episodes. Reports last associated syncopal episode occurred this evening when she experienced and episode of very severe pain shooting from posterior neck to left temporal region. Treats symptoms by taking hydrocodone with moderate relief. Denies vomiting, photophobia, blurred vision, head injury, weakness, numbness, tingling, chest pain, nausea. Patient reports she has been evaluated recently for HA by Dr. Roy-Neurosurgeon and PCP- Dr Karie Kirks and had and MRI-Brain performed with abnormal results but is unable to provide additional details. Does states that Dr. Carloyn Manner had scheduled a follow-up MRI with contrast to be performed soon.  PCP-[place]  Past Medical History  Diagnosis Date  . Hypertension     Past Surgical History  Procedure Date  . Back surgery   . Cholecystectomy   . Abdominal hysterectomy     No family history on file.  History  Substance Use Topics  . Smoking status: Current Everyday Smoker    Types: Cigarettes  . Smokeless tobacco: Not on file  . Alcohol Use: No    OB History    Grav Para Term Preterm Abortions TAB SAB Ect Mult Living                  Review of  Systems 10 Systems reviewed and are negative for acute change except as noted in the HPI.  Allergies  Review of patient's allergies indicates no known allergies.  Home Medications   Current Outpatient Rx  Name Route Sig Dispense Refill  . VITAMIN D 2000 UNITS PO CAPS Oral Take 1 capsule by mouth daily.      Marland Kitchen CLONAZEPAM 1 MG PO TABS Oral Take 1 mg by mouth 2 (two) times daily as needed. For nerves     . HYDROCHLOROTHIAZIDE 25 MG PO TABS Oral Take 25 mg by mouth daily as needed. fluid     . HYDROCODONE-ACETAMINOPHEN 10-650 MG PO TABS Oral Take 1 tablet by mouth every 6 (six) hours as needed. For pain       BP 169/91  Pulse 92  Temp(Src) 98.5 F (36.9 C) (Oral)  Resp 20  Ht _0  (1.676 m)  Wt 158 lb (71.668 kg)  BMI 25.50 kg/m2  SpO2 97%  Physical Exam  Nursing note and vitals reviewed. Constitutional: She is oriented to person, place, and time. She appears well-developed and well-nourished.       Uncomfortable appearing.   HENT:  Head: Normocephalic and atraumatic.  Left Ear: External ear normal.  Nose: Nose normal.       No tenderness about temporal artery.   Eyes: EOM are normal. Pupils are equal, round, and reactive to light.  Neck: Normal range of motion. Neck supple. No tracheal deviation present.  Cardiovascular: Normal rate, regular rhythm and intact distal pulses.  No murmur heard. Pulmonary/Chest: Effort normal and breath sounds normal. No respiratory distress.  Abdominal: She exhibits no distension.  Musculoskeletal: Normal range of motion. She exhibits no edema.       Swollen area to left of spinous process of neck with no fluctuance or induration.   Neurological: She is alert and oriented to person, place, and time. No sensory deficit.       No facial droop. Speech normal.   Skin: Skin is warm and dry.  Psychiatric: She has a normal mood and affect. Her behavior is normal.    ED Course  Procedures (including critical care time)   Labs Reviewed   SEDIMENTATION RATE   No results found.   No diagnosis found.  DIAGNOSTIC STUDIES: Oxygen Saturation is 97% on room air, normal by my interpretation.    COORDINATION OF CARE: 7:15PM- EDP at bedside discusses possible causes of current HA and need for follow up with Neurologist.  10:35PM- Patient reports HA is improved at this time but still present following administration of Migraine cocktail. Advised of report of recent MRI as ordered by Dr. Carloyn Manner. Patient informed of intent to d/c home. Patient agrees with plan set forth at this time.   MDM    I reviewed results from [facility], MRI.  Indication was for left side trigeminal neuralgia.  No acute infarct was impression.  Noted was some white matter changes mostly in posterior left frontal lobe and left parietal lobe, which could be related to small vessel disease although other considerations could not be excluded.  Likely this is reason why Dr. Carloyn Manner needs to do another MRI with contrast as described by pt.  Clinically, I too suspected trigeminal neuralgia.  Will start her on carbamazepine.  meds for migraine given as well and will reassess.  No gross neuro deficits here.  Plus, MRI from 02/27/11 shows no acute infarct.  Will get sed rate to r/o TA.  Otherwise, will start her on carbamazepine and she can follow up with Dr. Carloyn Manner as previously scheduled.      I personally performed the services described in this documentation, which was scribed in my presence. The recorded information has been reviewed and considered.       11:03 PM Pt does feel improved, would like to go home and rest.  HA is still somewhat there, but improved.  Will recommend continuing carbamazepine and to follow up with Dr. Carloyn Manner and Dr. Erling Cruz her neurologist which she reports that she will.  Offered to change her analgesic to percocet but she denied.    [name]. Dorna Mai, MD 03/09/11 [phone]

## 2011-03-09 NOTE — Discharge Instructions (Signed)
Trigeminal Neuralgia Trigeminal neuralgia is a nerve disorder that causes sudden attacks of severe facial pain. It is caused by damage to the trigeminal nerve, a major nerve in the face. It is more common in women and in the elderly, although it can also happen in younger patients. Attacks last from a few seconds to several minutes and can occur from a couple of times per year to several times per day. Trigeminal neuralgia can be a very distressing and disabling condition. Surgery may be needed in very severe cases if medical treatment does not give relief. HOME CARE INSTRUCTIONS   If your caregiver prescribed medication to help prevent attacks, take as directed.   To help prevent attacks:   Chew on the unaffected side of the mouth.   Avoid touching your face.   Avoid blasts of hot or cold air.   Men may wish to grow a beard to avoid having to shave.  SEEK IMMEDIATE MEDICAL CARE IF:  Pain is unbearable and your medicine does not help.   You develop new, unexplained symptoms (problems).   You have problems that may be related to a medication you are taking.  Document Released: 03/20/2000 Document Revised: 12/03/2010 Document Reviewed: 01/18/2009 [facility] Patient Information 2012 [place], [place].

## 2011-03-09 NOTE — ED Notes (Signed)
Discharge instructions reviewed with pt, questions answered. Pt verbalized understanding.

## 2011-03-09 NOTE — ED Notes (Signed)
Pt states headache x 1 week. Seen Dr. Karie Kirks. MRI without contrast was done and pt states she needs one with contrast. Pt states, "My head hurts so bad I pass out sometime.( states she last passed out prior to seeing Dr. Karie Kirks)

## 2011-08-24 ENCOUNTER — Other Ambulatory Visit (HOSPITAL_COMMUNITY): Payer: Self-pay | Admitting: Family Medicine

## 2011-08-24 DIAGNOSIS — Z139 Encounter for screening, unspecified: Secondary | ICD-10-CM

## 2011-09-17 ENCOUNTER — Ambulatory Visit (HOSPITAL_COMMUNITY)
Admission: RE | Admit: 2011-09-17 | Discharge: 2011-09-17 | Disposition: A | Discharge status: Home / Self Care | Payer: Medicare Other | Source: Ambulatory Visit | Attending: Family Medicine | Admitting: Family Medicine

## 2011-09-17 DIAGNOSIS — Z1231 Encounter for screening mammogram for malignant neoplasm of breast: Secondary | ICD-10-CM | POA: Insufficient documentation

## 2011-09-17 DIAGNOSIS — Z139 Encounter for screening, unspecified: Secondary | ICD-10-CM

## 2012-03-08 ENCOUNTER — Ambulatory Visit (HOSPITAL_COMMUNITY)
Admission: RE | Admit: 2012-03-08 | Discharge: 2012-03-08 | Disposition: A | Discharge status: Home / Self Care | Payer: Medicare Other | Source: Ambulatory Visit | Attending: Family Medicine | Admitting: Family Medicine

## 2012-03-08 ENCOUNTER — Other Ambulatory Visit (HOSPITAL_COMMUNITY): Payer: Self-pay | Admitting: Family Medicine

## 2012-03-08 DIAGNOSIS — R634 Abnormal weight loss: Secondary | ICD-10-CM | POA: Insufficient documentation

## 2012-03-08 DIAGNOSIS — R112 Nausea with vomiting, unspecified: Secondary | ICD-10-CM | POA: Insufficient documentation

## 2012-05-04 ENCOUNTER — Other Ambulatory Visit (HOSPITAL_COMMUNITY): Payer: Self-pay | Admitting: Family Medicine

## 2012-05-04 DIAGNOSIS — R634 Abnormal weight loss: Secondary | ICD-10-CM

## 2012-05-04 DIAGNOSIS — R05 Cough: Secondary | ICD-10-CM

## 2012-05-04 DIAGNOSIS — F17200 Nicotine dependence, unspecified, uncomplicated: Secondary | ICD-10-CM

## 2012-05-09 ENCOUNTER — Ambulatory Visit (HOSPITAL_COMMUNITY)
Admission: RE | Admit: 2012-05-09 | Discharge: 2012-05-09 | Disposition: A | Discharge status: Home / Self Care | Payer: Medicare Other | Source: Ambulatory Visit | Attending: Family Medicine | Admitting: Family Medicine

## 2012-05-09 DIAGNOSIS — R932 Abnormal findings on diagnostic imaging of liver and biliary tract: Secondary | ICD-10-CM | POA: Insufficient documentation

## 2012-05-09 DIAGNOSIS — E049 Nontoxic goiter, unspecified: Secondary | ICD-10-CM | POA: Insufficient documentation

## 2012-05-09 DIAGNOSIS — F17200 Nicotine dependence, unspecified, uncomplicated: Secondary | ICD-10-CM | POA: Insufficient documentation

## 2012-05-09 DIAGNOSIS — R634 Abnormal weight loss: Secondary | ICD-10-CM | POA: Insufficient documentation

## 2012-05-09 DIAGNOSIS — R05 Cough: Secondary | ICD-10-CM

## 2012-05-09 MED ORDER — IOHEXOL 300 MG/ML  SOLN
80.0000 mL | Freq: Once | INTRAMUSCULAR | Status: AC | PRN
  Administered 2012-05-09: 80 mL via INTRAVENOUS

## 2012-05-16 ENCOUNTER — Other Ambulatory Visit (HOSPITAL_COMMUNITY): Payer: Self-pay | Admitting: Family Medicine

## 2012-05-16 DIAGNOSIS — R634 Abnormal weight loss: Secondary | ICD-10-CM

## 2012-05-16 DIAGNOSIS — E041 Nontoxic single thyroid nodule: Secondary | ICD-10-CM

## 2012-05-18 ENCOUNTER — Other Ambulatory Visit (HOSPITAL_COMMUNITY): Payer: Self-pay | Admitting: Family Medicine

## 2012-05-18 ENCOUNTER — Ambulatory Visit (HOSPITAL_COMMUNITY)
Admission: RE | Admit: 2012-05-18 | Discharge: 2012-05-18 | Disposition: A | Discharge status: Home / Self Care | Payer: Medicare Other | Source: Ambulatory Visit | Attending: Family Medicine | Admitting: Family Medicine

## 2012-05-18 DIAGNOSIS — R634 Abnormal weight loss: Secondary | ICD-10-CM

## 2012-05-18 DIAGNOSIS — E041 Nontoxic single thyroid nodule: Secondary | ICD-10-CM

## 2012-05-18 DIAGNOSIS — E049 Nontoxic goiter, unspecified: Secondary | ICD-10-CM | POA: Insufficient documentation

## 2012-05-18 MED ORDER — IOHEXOL 300 MG/ML  SOLN
50.0000 mL | Freq: Once | INTRAMUSCULAR | Status: AC | PRN
  Administered 2012-05-18: 50 mL via ORAL

## 2012-05-18 MED ORDER — IOHEXOL 300 MG/ML  SOLN: 100.0000 mL | Freq: Once | INTRAMUSCULAR | Status: DC | PRN

## 2012-05-18 MED ORDER — IOHEXOL 300 MG/ML  SOLN
80.0000 mL | Freq: Once | INTRAMUSCULAR | Status: AC | PRN
  Administered 2012-05-18: 80 mL via INTRAVENOUS

## 2012-08-24 ENCOUNTER — Encounter: Payer: Self-pay | Admitting: Internal Medicine

## 2012-08-25 ENCOUNTER — Ambulatory Visit (INDEPENDENT_AMBULATORY_CARE_PROVIDER_SITE_OTHER): Payer: Medicare Other | Admitting: Gastroenterology

## 2012-08-25 ENCOUNTER — Encounter: Payer: Self-pay | Admitting: Gastroenterology

## 2012-08-25 VITALS — BP 109/65 | HR 78 | Temp 98.4°F | Ht 65.0 in | Wt 141.2 lb

## 2012-08-25 DIAGNOSIS — R634 Abnormal weight loss: Secondary | ICD-10-CM | POA: Insufficient documentation

## 2012-08-25 DIAGNOSIS — R109 Unspecified abdominal pain: Secondary | ICD-10-CM

## 2012-08-25 NOTE — Patient Instructions (Addendum)
Please complete the stool sample and return to our office.  We have scheduled you for a closer look at your belly to see if there is anything going on with the blood vessels that help feed your GI system.   Please have the blood work done prior to the scan. They need to check your kidneys before giving the dye.  Further recommendations to follow.

## 2012-08-25 NOTE — Progress Notes (Signed)
Primary Care Physician:  Robert Bellow, MD Primary Gastroenterologist:  Dr. Gala Romney   Chief Complaint  Patient presents with  . Abdominal Pain    lower abd and on the sides    HPI:   [name] is a pleasant 67 year old female presenting at the request of Dr. Karie Kirks secondary to abdominal pain. She was last seen by [place] in the remote past for a colonoscopy. Most recent colonoscopy in 2012 by Dr. Arnoldo Morale normal but torturous colon. EGD also normal. She has had a Bilroth I procedure in the remote past due to ulcer disease per her report.   Presents today with lower abdominal pain, LLQ pain. Present X 64month Pain is intermittent. After she eats, awhile later feels the pain. States feels like her "flesh" is sore. Kept putting it off. States she is lactose intolerant. No diarrhea. BM yesterday, denies constipation. Doesn't take fiber. States was "running me off". Even going to bathroom, still hurts. Breaks out in a sweat. Stool is soft, formed. No rectal bleeding. She has been losing weight, had lost down to 125. Now 141. Highest weight was 160s. Fearful of eating due to hurting. Would make her nauseated. No GERD issues, controlled with Protonix. No melena.   Past Medical History  Diagnosis Date  . Hypertension   . GERD (gastroesophageal reflux disease)   . Ulcer     Billroth I  . Chronic back pain     Past Surgical History  Procedure Laterality Date  . Back surgery      X4  . Cholecystectomy    . Abdominal hysterectomy    . Colonoscopy   05/18/2003    RXVQ:[id number]/ Internal hemorrhoids.  Otherwise, normal rectum  . Esophagogastroduodenoscopy  05/18/2003    RMR:. Normal esophagus/Adenomatous-appearing mucosa at the anastomosis  . Colonoscopy  08/12/2010    Dr. JArnoldo Morale cecum visualized and normal, colon and rectum normal. Torturous colon  . Esophagogastroduodenoscopy  08/12/2010    Dr. JArnoldo Morale anastomosis widely patent, no ulcerations, CLO test negative  .  Bilroth I procedure      Current Outpatient Prescriptions  Medication Sig Dispense Refill  . clonazePAM (KLONOPIN) 1 MG tablet Take 1 mg by mouth 2 (two) times daily as needed. For nerves       . hydrochlorothiazide (HYDRODIURIL) 25 MG tablet Take 25 mg by mouth daily as needed. fluid       . HYDROcodone-acetaminophen (NORCO) 10-325 MG per tablet Take 1 tablet by mouth every 6 (six) hours as needed.       . pantoprazole (PROTONIX) 40 MG tablet Take 40 mg by mouth daily.        No current facility-administered medications for this visit.    Allergies as of 08/25/2012  . (No Known Allergies)    Family History  Problem Relation Age of Onset  . Colon cancer Neg Hx     History   Social History  . Marital Status: Married    Spouse Name: N/A    Number of Children: N/A  . Years of Education: N/A   Occupational History  . Not on file.   Social History Main Topics  . Smoking status: Current Every Day Smoker    Types: Cigarettes  . Smokeless tobacco: Not on file  . Alcohol Use: No  . Drug Use: No  . Sexually Active: Not on file   Other Topics Concern  . Not on file   Social History Narrative  . No narrative on file    Review  of Systems: Gen: SEE HPI CV: Denies chest pain, heart palpitations, peripheral edema, syncope.  Resp: Denies shortness of breath at rest or with exertion. Denies wheezing or cough.  GI: SEE HPI GU : Denies urinary burning, urinary frequency, urinary hesitancy MS: +back pain Derm: Denies rash, itching, dry skin Psych: occasional depression/anxiety Heme: Denies bruising, bleeding, and enlarged lymph nodes.  Physical Exam: BP 109/65  Pulse 78  Temp(Src) 98.4 F (36.9 C) (Oral)  Ht 5' 5" (1.651 m)  Wt 141 lb 3.2 oz (64.048 kg)  BMI 23.5 kg/m2 General:   Alert and oriented. Pleasant and cooperative. Well-nourished and well-developed.  Head:  Normocephalic and atraumatic. Eyes:  Without icterus, sclera clear and conjunctiva pink.  Ears:   Normal auditory acuity. Nose:  No deformity, discharge,  or lesions. Mouth:  No deformity or lesions, oral mucosa pink.  Neck:  Supple, without mass or thyromegaly. Lungs:  Clear to auscultation bilaterally. No wheezes, rales, or rhonchi. No distress.  Heart:  S1, S2 present without murmurs appreciated.  Abdomen:  +BS, soft, TTP LLQ with only light touch, BUT NO peritoneal signs. non-distended. No HSM noted. No guarding or rebound. No masses appreciated.  Rectal:  Deferred  Msk:  Symmetrical without gross deformities. Normal posture. Extremities:  Without clubbing or edema. Neurologic:  Alert and  oriented x4;  grossly normal neurologically. Skin:  Intact without significant lesions or rashes. Cervical Nodes:  No significant cervical adenopathy. Psych:  Alert and cooperative. Normal mood and affect.   CT Feb 2014:  A focal area of sub centimeter hyperattenuation in the peripheral  right hepatic lobe (image 9) may represent a flash fill hemangioma  or perfusion anomaly. Liver is otherwise unremarkable.  Cholecystectomy. Extrahepatic bile duct prominence is unchanged.  Adrenal glands, kidneys, spleen, pancreas, stomach and bowel are  unremarkable. Small bowel mesenteric haziness and nodularity are  unchanged. Postoperative changes are seen along the right lateral  ventral abdominal wall.  No pathologically enlarged lymph nodes. No free fluid.  Atherosclerotic calcification of the arterial vasculature without  abdominal aortic aneurysm. No worrisome lytic or sclerotic  lesions. Postoperative changes are seen in the spine.

## 2012-08-25 NOTE — Assessment & Plan Note (Signed)
CT angiogram in near future. Obtain most updated labs from Dr. Karie Kirks.

## 2012-08-25 NOTE — Assessment & Plan Note (Signed)
67 year old female with LLQ and lower abdominal pain, intermittent, worsened at times after eating, associated with close to 40 lbs weight loss. Colonoscopy on file from Dr. Arnoldo Morale in 2012, normal. CT from Feb 2014 reviewed. Prior CTs in past questioned fibrosing mesenteritis. No constipation, diarrhea, rectal bleeding, change in bowel habits. High concern for mesenteric ischemia at this point. Need CT angiogram in very near future. Obtain ifobt now. Consider updated colonoscopy if negative CTA or positive ifobt. Weight loss concerning. No upper GI symptoms at this point.

## 2012-08-25 NOTE — Progress Notes (Signed)
Cc PCP

## 2012-08-26 ENCOUNTER — Ambulatory Visit (INDEPENDENT_AMBULATORY_CARE_PROVIDER_SITE_OTHER): Payer: Medicare Other | Admitting: Gastroenterology

## 2012-08-26 DIAGNOSIS — R109 Unspecified abdominal pain: Secondary | ICD-10-CM

## 2012-08-26 LAB — BASIC METABOLIC PANEL
CO2: 29 mEq/L (ref 19–32)
Chloride: 103 mEq/L (ref 96–112)
Creat: 0.58 mg/dL (ref 0.50–1.10)
Potassium: 4.3 mEq/L (ref 3.5–5.3)

## 2012-08-31 ENCOUNTER — Ambulatory Visit (HOSPITAL_COMMUNITY)
Admission: RE | Admit: 2012-08-31 | Discharge: 2012-08-31 | Disposition: A | Discharge status: Home / Self Care | Payer: Medicare Other | Source: Ambulatory Visit | Attending: Gastroenterology | Admitting: Gastroenterology

## 2012-08-31 DIAGNOSIS — R109 Unspecified abdominal pain: Secondary | ICD-10-CM

## 2012-08-31 DIAGNOSIS — I10 Essential (primary) hypertension: Secondary | ICD-10-CM | POA: Insufficient documentation

## 2012-08-31 DIAGNOSIS — R634 Abnormal weight loss: Secondary | ICD-10-CM

## 2012-08-31 MED ORDER — IOHEXOL 350 MG/ML SOLN
100.0000 mL | Freq: Once | INTRAVENOUS | Status: AC | PRN
  Administered 2012-08-31: 100 mL via INTRAVENOUS

## 2012-09-01 ENCOUNTER — Telehealth: Payer: Self-pay | Admitting: Internal Medicine

## 2012-09-01 NOTE — Telephone Encounter (Signed)
Pt has called back again this afternoon wanting to know her CT results. I told her that the nurse is aware, but she hasn't received a signed off copy from the doctor yet and once she does she will call patient with the results. Patient is still very anxious and said she is still hurting and can't get in touch with her PCP and needs to know what to do. I assured her that the nurse will call her once we get the results.

## 2012-09-01 NOTE — Telephone Encounter (Signed)
Pt called this afternoon requesting her results from her testing she had done yesterday. I believe she meant her CT she had done along with her labs and stool studies she did earlier in the week. Please call her at [phone] patient is very anxious

## 2012-09-05 ENCOUNTER — Other Ambulatory Visit: Payer: Self-pay | Admitting: Internal Medicine

## 2012-09-05 DIAGNOSIS — K625 Hemorrhage of anus and rectum: Secondary | ICD-10-CM

## 2012-09-05 DIAGNOSIS — R1013 Epigastric pain: Secondary | ICD-10-CM

## 2012-09-05 MED ORDER — PEG 3350-KCL-NA BICARB-NACL 420 G PO SOLR: 4000.0000 mL | ORAL | Status: DC

## 2012-09-05 NOTE — Progress Notes (Signed)
Quick Note:  I have contacted radiology at [phone], where these are read. I wanted to get further details regarding her findings. I spoke with Dr. Tery Sanfilippo, who briefly reviewed this with me. States collaterals are present, and with the SMA and celiac patent, it is not likely that we are dealing with mesenteric ischemia. Will review further with Dr. Gala Romney. Anticipate colonoscopy. ______

## 2012-09-05 NOTE — Progress Notes (Signed)
Quick Note:  Reviewed CT angiogram. Findings of patent celiac and SMA. 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 Per radiologist, no findings to point to mesenteric ischemia, which is good. She is heme positive. Last colonoscopy in 2012. I need to discuss with Dr. Gala Romney, but I am thinking we may need to pursue a colonoscopy. Will let her know ASAP. ______

## 2012-09-05 NOTE — Telephone Encounter (Signed)
Discussed with Dr. Gala Romney. CTA negative for mesenteric ischemia.  Proceed with colonoscopy. Thanks!

## 2012-09-05 NOTE — Progress Notes (Signed)
Quick Note:  Pt aware, [name], please schedule ______

## 2012-09-05 NOTE — Telephone Encounter (Signed)
Patient is scheduled with RMR on Wed June 4th and she is aware and she is coming by to pick up instructions

## 2012-09-05 NOTE — Progress Notes (Signed)
Quick Note:  Spoke with Dr. Gala Romney. Proceed with colonoscopy as next step.  I have cc'ed Dr. Karie Kirks on this. ______

## 2012-09-05 NOTE — Telephone Encounter (Signed)
Pt left voicemail this morning. She said she is still having the same problems and wants to know her results. Please advise.

## 2012-09-05 NOTE — Telephone Encounter (Signed)
Pt aware, she is c/o pain and Dr. Karie Kirks gave her some pain medication. she was informed that if her pain got severe she should go to the ED.   Leighann, please schedule tcs. Pt said she would like it asap.

## 2012-09-05 NOTE — Progress Notes (Signed)
Quick Note:  ifobt positive. ______

## 2012-09-06 ENCOUNTER — Encounter (HOSPITAL_COMMUNITY): Payer: Self-pay | Admitting: Pharmacy Technician

## 2012-09-07 ENCOUNTER — Ambulatory Visit (HOSPITAL_COMMUNITY)
Admission: RE | Admit: 2012-09-07 | Discharge: 2012-09-07 | Disposition: A | Discharge status: Home / Self Care | Payer: Medicare Other | Source: Ambulatory Visit | Attending: Internal Medicine | Admitting: Internal Medicine

## 2012-09-07 ENCOUNTER — Encounter (HOSPITAL_COMMUNITY): Payer: Self-pay | Admitting: *Deleted

## 2012-09-07 ENCOUNTER — Encounter (HOSPITAL_COMMUNITY)
Admission: RE | Disposition: A | Discharge status: Home / Self Care | Payer: Self-pay | Source: Ambulatory Visit | Attending: Internal Medicine

## 2012-09-07 DIAGNOSIS — I10 Essential (primary) hypertension: Secondary | ICD-10-CM | POA: Insufficient documentation

## 2012-09-07 DIAGNOSIS — D126 Benign neoplasm of colon, unspecified: Secondary | ICD-10-CM

## 2012-09-07 DIAGNOSIS — R109 Unspecified abdominal pain: Secondary | ICD-10-CM

## 2012-09-07 DIAGNOSIS — R1013 Epigastric pain: Secondary | ICD-10-CM

## 2012-09-07 DIAGNOSIS — R195 Other fecal abnormalities: Secondary | ICD-10-CM | POA: Insufficient documentation

## 2012-09-07 DIAGNOSIS — K625 Hemorrhage of anus and rectum: Secondary | ICD-10-CM

## 2012-09-07 DIAGNOSIS — D175 Benign lipomatous neoplasm of intra-abdominal organs: Secondary | ICD-10-CM

## 2012-09-07 HISTORY — PX: COLONOSCOPY: SHX5424

## 2012-09-07 SURGERY — COLONOSCOPY
Anesthesia: Moderate Sedation

## 2012-09-07 MED ORDER — MIDAZOLAM HCL 5 MG/5ML IJ SOLN
INTRAMUSCULAR | Status: DC | PRN
  Administered 2012-09-07 (×2): 2 mg via INTRAVENOUS
  Administered 2012-09-07: 1 mg via INTRAVENOUS
  Administered 2012-09-07: 2 mg via INTRAVENOUS
  Administered 2012-09-07 (×2): 1 mg via INTRAVENOUS

## 2012-09-07 MED ORDER — SODIUM CHLORIDE 0.9 % IV SOLN
INTRAVENOUS | Status: DC
  Administered 2012-09-07: 12:00:00 via INTRAVENOUS

## 2012-09-07 MED ORDER — STERILE WATER FOR IRRIGATION IR SOLN
Status: DC | PRN
  Administered 2012-09-07: 12:00:00

## 2012-09-07 MED ORDER — MIDAZOLAM HCL 5 MG/5ML IJ SOLN
INTRAMUSCULAR | Status: AC
  Filled 2012-09-07: qty 10

## 2012-09-07 MED ORDER — MEPERIDINE HCL 100 MG/ML IJ SOLN
INTRAMUSCULAR | Status: AC
  Filled 2012-09-07: qty 2

## 2012-09-07 MED ORDER — ONDANSETRON HCL 4 MG/2ML IJ SOLN
INTRAMUSCULAR | Status: AC
  Filled 2012-09-07: qty 2

## 2012-09-07 MED ORDER — MEPERIDINE HCL 100 MG/ML IJ SOLN
INTRAMUSCULAR | Status: DC | PRN
  Administered 2012-09-07 (×3): 50 mg via INTRAVENOUS
  Administered 2012-09-07: 25 mg via INTRAVENOUS

## 2012-09-07 MED ORDER — ONDANSETRON HCL 4 MG/2ML IJ SOLN
INTRAMUSCULAR | Status: DC | PRN
  Administered 2012-09-07: 4 mg via INTRAVENOUS

## 2012-09-07 NOTE — Interval H&P Note (Signed)
History and Physical Interval Note:  09/07/2012 12:10 PM  [name]  has presented today for surgery, with the diagnosis of Abdominal Pain and Rectal Bleeding  The various methods of treatment have been discussed with the patient and family. After consideration of risks, benefits and other options for treatment, the patient has consented to  Procedure(s) with comments: COLONOSCOPY (N/A) - 2:15 as a surgical intervention .  The patient's history has been reviewed, patient examined, no change in status, stable for surgery.  I have reviewed the patient's chart and labs.  Questions were answered to the patient's satisfaction.     [name]  Stool positive for occult blood. CTA demonstrates no significant encroachment on mesenteric vascular supply. Colonoscopy now being done per plan.The risks, benefits, limitations, alternatives and imponderables have been reviewed with the patient. Questions have been answered. All parties are agreeable.

## 2012-09-07 NOTE — H&P (View-Only) (Signed)
Primary Care Physician:  Robert Bellow, MD Primary Gastroenterologist:  Dr. Gala Romney   Chief Complaint  Patient presents with  . Abdominal Pain    lower abd and on the sides    HPI:   [name] is a pleasant 67 year old female presenting at the request of Dr. Karie Kirks secondary to abdominal pain. She was last seen by [place] in the remote past for a colonoscopy. Most recent colonoscopy in 2012 by Dr. Arnoldo Morale normal but torturous colon. EGD also normal. She has had a Bilroth I procedure in the remote past due to ulcer disease per her report.   Presents today with lower abdominal pain, LLQ pain. Present X 64month Pain is intermittent. After she eats, awhile later feels the pain. States feels like her "flesh" is sore. Kept putting it off. States she is lactose intolerant. No diarrhea. BM yesterday, denies constipation. Doesn't take fiber. States was "running me off". Even going to bathroom, still hurts. Breaks out in a sweat. Stool is soft, formed. No rectal bleeding. She has been losing weight, had lost down to 125. Now 141. Highest weight was 160s. Fearful of eating due to hurting. Would make her nauseated. No GERD issues, controlled with Protonix. No melena.   Past Medical History  Diagnosis Date  . Hypertension   . GERD (gastroesophageal reflux disease)   . Ulcer     Billroth I  . Chronic back pain     Past Surgical History  Procedure Laterality Date  . Back surgery      X4  . Cholecystectomy    . Abdominal hysterectomy    . Colonoscopy   05/18/2003    RXVQ:[id number]/ Internal hemorrhoids.  Otherwise, normal rectum  . Esophagogastroduodenoscopy  05/18/2003    RMR:. Normal esophagus/Adenomatous-appearing mucosa at the anastomosis  . Colonoscopy  08/12/2010    Dr. JArnoldo Morale cecum visualized and normal, colon and rectum normal. Torturous colon  . Esophagogastroduodenoscopy  08/12/2010    Dr. JArnoldo Morale anastomosis widely patent, no ulcerations, CLO test negative  .  Bilroth I procedure      Current Outpatient Prescriptions  Medication Sig Dispense Refill  . clonazePAM (KLONOPIN) 1 MG tablet Take 1 mg by mouth 2 (two) times daily as needed. For nerves       . hydrochlorothiazide (HYDRODIURIL) 25 MG tablet Take 25 mg by mouth daily as needed. fluid       . HYDROcodone-acetaminophen (NORCO) 10-325 MG per tablet Take 1 tablet by mouth every 6 (six) hours as needed.       . pantoprazole (PROTONIX) 40 MG tablet Take 40 mg by mouth daily.        No current facility-administered medications for this visit.    Allergies as of 08/25/2012  . (No Known Allergies)    Family History  Problem Relation Age of Onset  . Colon cancer Neg Hx     History   Social History  . Marital Status: Married    Spouse Name: N/A    Number of Children: N/A  . Years of Education: N/A   Occupational History  . Not on file.   Social History Main Topics  . Smoking status: Current Every Day Smoker    Types: Cigarettes  . Smokeless tobacco: Not on file  . Alcohol Use: No  . Drug Use: No  . Sexually Active: Not on file   Other Topics Concern  . Not on file   Social History Narrative  . No narrative on file    Review  of Systems: Gen: SEE HPI CV: Denies chest pain, heart palpitations, peripheral edema, syncope.  Resp: Denies shortness of breath at rest or with exertion. Denies wheezing or cough.  GI: SEE HPI GU : Denies urinary burning, urinary frequency, urinary hesitancy MS: +back pain Derm: Denies rash, itching, dry skin Psych: occasional depression/anxiety Heme: Denies bruising, bleeding, and enlarged lymph nodes.  Physical Exam: BP 109/65  Pulse 78  Temp(Src) 98.4 F (36.9 C) (Oral)  Ht 5' 5" (1.651 m)  Wt 141 lb 3.2 oz (64.048 kg)  BMI 23.5 kg/m2 General:   Alert and oriented. Pleasant and cooperative. Well-nourished and well-developed.  Head:  Normocephalic and atraumatic. Eyes:  Without icterus, sclera clear and conjunctiva pink.  Ears:   Normal auditory acuity. Nose:  No deformity, discharge,  or lesions. Mouth:  No deformity or lesions, oral mucosa pink.  Neck:  Supple, without mass or thyromegaly. Lungs:  Clear to auscultation bilaterally. No wheezes, rales, or rhonchi. No distress.  Heart:  S1, S2 present without murmurs appreciated.  Abdomen:  +BS, soft, TTP LLQ with only light touch, BUT NO peritoneal signs. non-distended. No HSM noted. No guarding or rebound. No masses appreciated.  Rectal:  Deferred  Msk:  Symmetrical without gross deformities. Normal posture. Extremities:  Without clubbing or edema. Neurologic:  Alert and  oriented x4;  grossly normal neurologically. Skin:  Intact without significant lesions or rashes. Cervical Nodes:  No significant cervical adenopathy. Psych:  Alert and cooperative. Normal mood and affect.   CT Feb 2014:  A focal area of sub centimeter hyperattenuation in the peripheral  right hepatic lobe (image 9) may represent a flash fill hemangioma  or perfusion anomaly. Liver is otherwise unremarkable.  Cholecystectomy. Extrahepatic bile duct prominence is unchanged.  Adrenal glands, kidneys, spleen, pancreas, stomach and bowel are  unremarkable. Small bowel mesenteric haziness and nodularity are  unchanged. Postoperative changes are seen along the right lateral  ventral abdominal wall.  No pathologically enlarged lymph nodes. No free fluid.  Atherosclerotic calcification of the arterial vasculature without  abdominal aortic aneurysm. No worrisome lytic or sclerotic  lesions. Postoperative changes are seen in the spine.

## 2012-09-07 NOTE — Op Note (Signed)
[facility] [address] [place], [address]   COLONOSCOPY PROCEDURE REPORT  PATIENT: [name], [name]  MR#:         [id number] BIRTHDATE: 03/11/1946 , 87  yrs. old GENDER: Female ENDOSCOPIST: R.  Garfield Cornea, MD FACP FACG REFERRED BY:  Lemmie Evens, M.D. PROCEDURE DATE:  09/07/2012 PROCEDURE:     Colonoscopy with snare polypectomy  INDICATIONS: Chronic lower abdominal pain; Hemoccult-positive stool  INFORMED CONSENT:  The risks, benefits, alternatives and imponderables including but not limited to bleeding, perforation as well as the possibility of a missed lesion have been reviewed.  The potential for biopsy, lesion removal, etc. have also been discussed.  Questions have been answered.  All parties agreeable. Please see the history and physical in the medical record for more information.  MEDICATIONS: Versed 9 mg IV and Demerol 175 mg IV in divided doses. Zofran 4 mg IV  DESCRIPTION OF PROCEDURE:  After a digital rectal exam was performed, the EC-3890Li ([id number])  colonoscope was advanced from the anus through the rectum and colon to the area of the cecum, ileocecal valve and appendiceal orifice.  The cecum was deeply intubated.  These structures were well-seen and photographed for the record.  From the level of the cecum and ileocecal valve, the scope was slowly and cautiously withdrawn.  The mucosal surfaces were carefully surveyed utilizing scope tip deflection to facilitate fold flattening as needed.  The scope was pulled down into the rectum where a thorough examination including retroflexion was performed.    FINDINGS: Adequate preparation. Normal rectum. (1) 4 mm pedunculated polyp in the mid sigmoid segment. There was a 2 cm yellowish submucosal nodule (positive pillows sign) at junction of sigmoid and descending segment; otherwise, the remainder of the colonic mucosa appeared normal.  THERAPEUTIC / DIAGNOSTIC MANEUVERS PERFORMED:   the sigmoid polyp mentioned above was hot snare/ removed.  COMPLICATIONS: None  CECAL WITHDRAWAL TIME:  13 minutes  IMPRESSION:  Colonic polyp-removed as described above. Colonic lipoma. No explanation for her abdominal pain based on today's examination. Patient may have chronic abdominal pain.  RECOMMENDATIONS: Followup on pathology. Consider gynecology evaluation as to the etiology of lower abdominal/pelvic pain. Patient may ultimately benefit from seeing a pain management specialist.   _______________________________ eSigned:  R. Garfield Cornea, MD FACP [facility] 09/07/2012 1:12 PM   CC:    PATIENT NAME:  Devaeh, Amadi MR#: [id number]

## 2012-09-12 ENCOUNTER — Encounter (HOSPITAL_COMMUNITY): Payer: Self-pay | Admitting: Internal Medicine

## 2012-09-12 ENCOUNTER — Encounter: Payer: Self-pay | Admitting: Internal Medicine

## 2012-09-13 ENCOUNTER — Telehealth: Payer: Self-pay

## 2012-09-13 DIAGNOSIS — R109 Unspecified abdominal pain: Secondary | ICD-10-CM

## 2012-09-13 NOTE — Telephone Encounter (Signed)
Spoke with AS-pt needs ov- ok to add to urgent spot tomorrow. Put her on schedule for 11:30 with AS tomorrow. Pt is aware.

## 2012-09-13 NOTE — Telephone Encounter (Signed)
Let's get a UA with culture reflex.

## 2012-09-13 NOTE — Telephone Encounter (Signed)
Tried to call pt- NA,  Called pts son- [name], he said she told him that she only has pain when she eats. He said she is trying not to eat anything because she knows the pain is going to follow . I informed him that she didn't c/o this when I spoke with her this morning.   Do you still want UA? Or something else. Her son doesn't seem to think this has anything to do with a UTI. Please advise.

## 2012-09-13 NOTE — Telephone Encounter (Signed)
Also spoke with [name]- she said she forgot to tell me that she had the pain with eating and drinking (even water). She said she is miserable and she hasnt had a BM since she did her prep last week and she is scared to try any laxatives.

## 2012-09-13 NOTE — Telephone Encounter (Signed)
Pt called requesting path results. RMR letter mailed out this morning. Gave her results per letter. Pt is still having lower abd pain and wants to know what we can do about it. She said the pain was the same as it was when she came in for her ov. No fever, no vomiting, nausea at times.   Pt has appt with AS on 09/29/12.   Please advise.

## 2012-09-13 NOTE — Progress Notes (Signed)
Pt is aware of OV on 6/26 at 10 with AS and reminder in epic to follow up with ext in 3 months for abd pain

## 2012-09-13 NOTE — Progress Notes (Unsigned)
Letter from: Daneil Dolin   Send letter to patient.  Send copy of letter with path to referring provider and PCP.   Offer followup appointment with extender in 3 months regarding abdominal pain

## 2012-09-14 ENCOUNTER — Ambulatory Visit (INDEPENDENT_AMBULATORY_CARE_PROVIDER_SITE_OTHER): Payer: Medicare Other | Admitting: Gastroenterology

## 2012-09-14 ENCOUNTER — Encounter: Payer: Self-pay | Admitting: Gastroenterology

## 2012-09-14 VITALS — BP 123/71 | HR 82 | Temp 97.9°F | Ht 65.0 in | Wt 140.4 lb

## 2012-09-14 DIAGNOSIS — R3989 Other symptoms and signs involving the genitourinary system: Secondary | ICD-10-CM

## 2012-09-14 DIAGNOSIS — R399 Unspecified symptoms and signs involving the genitourinary system: Secondary | ICD-10-CM

## 2012-09-14 DIAGNOSIS — R634 Abnormal weight loss: Secondary | ICD-10-CM

## 2012-09-14 DIAGNOSIS — R109 Unspecified abdominal pain: Secondary | ICD-10-CM

## 2012-09-14 NOTE — Patient Instructions (Addendum)
Please complete the blood work and urine. We will call with the results.  It is ok to take milk of magnesia this evening. Please call if you do not have a bowel movement.

## 2012-09-14 NOTE — Progress Notes (Signed)
Referring Provider: Robert Bellow, MD Primary Care Physician:  Robert Bellow, MD Primary GI: Dr. Gala Romney   Chief Complaint  Patient presents with  . Abdominal Pain    lower abd pain  . Constipation    HPI:   [name] returns today in follow-up after colonoscopy. Previously seen due to abdominal pain, weight loss per her report. CTA negative for mesenteric ischemia. Colonoscopy pursued due to pain, with tubular adenoma noted and nothing to explain pain.  Lower abdominal pain. Constant. Worse with eating. Even with water. No upper abdominal pain. Occasional nausea, no vomiting. No dysphagia. Heme positive but denies overt GI bleeding.  No BM since colonoscopy. States prior to this would have BM about every day, not hard not soft. +flatus, +stomach growling. Stays cold, no fever. +early satiety.   Feels like she has to urinate but extreme hesitancy. No foul odor. Yellow. No urinary burning.   Weight remains stable since May 2014. Dec 2012 noted weighing 158.    Past Medical History  Diagnosis Date  . Hypertension   . GERD (gastroesophageal reflux disease)   . Ulcer     Billroth I  . Chronic back pain     Past Surgical History  Procedure Laterality Date  . Back surgery      X4  . Cholecystectomy    . Abdominal hysterectomy    . Colonoscopy   05/18/2003    FVC:[id number] colonoscopy/ Internal hemorrhoids.  Otherwise, normal rectum  . Esophagogastroduodenoscopy  05/18/2003    RMR:. Normal esophagus/Adenomatous-appearing mucosa at the anastomosis  . Colonoscopy  08/12/2010    Dr. Arnoldo Morale: cecum visualized and normal, colon and rectum normal. Torturous colon  . Esophagogastroduodenoscopy  08/12/2010    Dr. Arnoldo Morale: anastomosis widely patent, no ulcerations, CLO test negative  . Bilroth i procedure    . Colonoscopy N/A 09/07/2012    RMR: tubular adenoma, lipoma. Due for surveillance in 2021    Current Outpatient Prescriptions  Medication Sig Dispense Refill  .  clonazePAM (KLONOPIN) 1 MG tablet Take 1 mg by mouth 2 (two) times daily as needed. For nerves       . hydrochlorothiazide (HYDRODIURIL) 25 MG tablet Take 25 mg by mouth daily as needed. fluid       . HYDROcodone-acetaminophen (NORCO) 10-325 MG per tablet Take 1 tablet by mouth every 6 (six) hours as needed for pain.       . pantoprazole (PROTONIX) 40 MG tablet Take 40 mg by mouth daily.       . polyethylene glycol-electrolytes (TRILYTE) 420 G solution Take 4,000 mLs by mouth as directed.  4000 mL  0   No current facility-administered medications for this visit.    Allergies as of 09/14/2012  . (No Known Allergies)    Family History  Problem Relation Age of Onset  . Colon cancer Neg Hx     History   Social History  . Marital Status: Married    Spouse Name: N/A    Number of Children: N/A  . Years of Education: N/A   Social History Main Topics  . Smoking status: Current Every Day Smoker -- 0.25 packs/day for 15 years    Types: Cigarettes  . Smokeless tobacco: None  . Alcohol Use: No  . Drug Use: No  . Sexually Active: None   Other Topics Concern  . None   Social History Narrative  . None    Review of Systems: Negative unless mentioned in HPI  Physical Exam: BP 123/71  Pulse 82  Temp(Src) 97.9 F (36.6 C) (Oral)  Ht 5' 5" (1.651 m)  Wt 140 lb 6.4 oz (63.685 kg)  BMI 23.36 kg/m2 General:   Alert and oriented. Tearful.  Head:  Normocephalic and atraumatic. Eyes:  Conjuctiva clear without scleral icterus. Heart:  S1, S2 present without murmurs, rubs, or gallops. Regular rate and rhythm. Abdomen:  +BS, soft. No rebound or guarding. No HSM or masses noted. TTP AT LOWER MIDLINE INCISION. + CARNETT'S SIGN Msk:  Symmetrical without gross deformities. Normal posture. Extremities:  Without edema. Neurologic:  Alert and  oriented x4;  grossly normal neurologically. Skin:  Intact without significant lesions or rashes. Psych:  Alert and cooperative. Normal mood and  affect.

## 2012-09-15 ENCOUNTER — Encounter: Payer: Self-pay | Admitting: Gastroenterology

## 2012-09-15 LAB — URINALYSIS W MICROSCOPIC + REFLEX CULTURE
Bilirubin Urine: NEGATIVE
Protein, ur: NEGATIVE mg/dL
Squamous Epithelial / LPF: NONE SEEN
Urobilinogen, UA: 0.2 mg/dL (ref 0.0–1.0)

## 2012-09-15 NOTE — Assessment & Plan Note (Signed)
Continued constant lower abdominal pain, reported as worsening with eating, even water. Now noting constipation, which is likely exacerbating her symptoms. CTA negative for mesenteric ischemia, and colonoscopy unrevealing as to source of discomfort. I strongly question adhesive disease, chronic abdominal pain as culprit of her symptoms. She points directly to midline lower surgical incision as site of discomfort, and she has +Carnett's sign. Due to questionable urinary symptoms, will order UA with culture. Pt would like to try milk of magnesia at home instead of Miralax; she denies a history of constipation and is not willing to try regular medication such as Linzess or Amitiza. Her weight is stable since last visit. She continues to deny any upper GI symptoms other than mild nausea. I do not see an indication for an EGD at this time. To be thorough, I've ordered a fasting cortisol level, UA, and will discuss plan further with Dr. Gala Romney. Anticipate pain management referral in near future.

## 2012-09-15 NOTE — Progress Notes (Signed)
CC PCP 

## 2012-09-16 LAB — CORTISOL-AM, BLOOD: Cortisol - AM: 13.9 ug/dL (ref 4.3–22.4)

## 2012-09-21 NOTE — Progress Notes (Signed)
Quick Note:  Negative urine, normal cortisol. Let's refer to Pain Management ______

## 2012-09-29 ENCOUNTER — Ambulatory Visit (INDEPENDENT_AMBULATORY_CARE_PROVIDER_SITE_OTHER): Payer: Medicare Other | Admitting: Gastroenterology

## 2012-09-29 ENCOUNTER — Encounter: Payer: Self-pay | Admitting: Gastroenterology

## 2012-09-29 VITALS — BP 150/83 | HR 81 | Temp 97.2°F | Ht 65.0 in | Wt 139.6 lb

## 2012-09-29 DIAGNOSIS — R109 Unspecified abdominal pain: Secondary | ICD-10-CM

## 2012-09-29 NOTE — Patient Instructions (Addendum)
Please keep appointment with Dr. Brantley Stage on July 7th.  We will have you return to see Dr. Gala Romney at the end of July.   Please call us if you have any questions.

## 2012-09-29 NOTE — Progress Notes (Signed)
CC PCP

## 2012-09-29 NOTE — Assessment & Plan Note (Signed)
67 year old female with thorough work-up for lower abdominal pain as described in HPI to include CT, colonoscopy, UA, cortisol level due to weight loss. Intermittent constipation noted, but she is refusing to try Linzess or Amitiza; she has Miralax at home which she would rather use. Again, no upper GI symptoms noted. Pain is constant, and her site of pain is located at incisional scars on physical examination. 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 I think referral to General Surgery is an excellent idea, already pursued by PCP. She will be seeing Dr. Brantley Stage on July 7th. I will retrieve most recent CBC. No indication for upper endoscopy at this time. She appears to be in better spirits today, and she really feels as if adhesions may be the culprit here. I would like her to return in late July to see Dr. Gala Romney only, which will leave time for her to see Dr. Brantley Stage. Anticipate Pain Management referral in future.

## 2012-09-29 NOTE — Progress Notes (Signed)
Referring Provider: Robert Bellow, MD Primary Care Physician:  Robert Bellow, MD Primary GI: Dr. Gala Romney   Chief Complaint  Patient presents with  . Follow-up    still hurting    HPI:   [name] returns today due to chronic abdominal pain, weight loss. CTA negative for ischemia, colonoscopy only noting tubular adenoma. UA negative, normal cortisol. Question of adhesive disease causing symptoms, with +Carnett's sign and pain located at site of surgical incision. Refused Linzess or Amitiza at last visit.  Had to go back for more pain medication from Dr. Karie Kirks. BM on Sunday, none in 4 days. Still refusing Linzess/Amitiza.    Scheduled to see Dr. Brantley Stage with [facility] for evaluation of adhesive disease on July 7. No melena, rectal bleeding, or any upper GI symptoms. Weight is stable.    Past Medical History  Diagnosis Date  . Hypertension   . GERD (gastroesophageal reflux disease)   . Ulcer     Billroth I  . Chronic back pain     Past Surgical History  Procedure Laterality Date  . Back surgery      X4  . Cholecystectomy    . Abdominal hysterectomy    . Colonoscopy   05/18/2003    PNT:[id number] colonoscopy/ Internal hemorrhoids.  Otherwise, normal rectum  . Esophagogastroduodenoscopy  05/18/2003    RMR:. Normal esophagus/Adenomatous-appearing mucosa at the anastomosis  . Colonoscopy  08/12/2010    Dr. Arnoldo Morale: cecum visualized and normal, colon and rectum normal. Torturous colon  . Esophagogastroduodenoscopy  08/12/2010    Dr. Arnoldo Morale: anastomosis widely patent, no ulcerations, CLO test negative  . Bilroth i procedure    . Colonoscopy N/A 09/07/2012    RMR: tubular adenoma, lipoma. Due for surveillance in 2021    Current Outpatient Prescriptions  Medication Sig Dispense Refill  . clonazePAM (KLONOPIN) 1 MG tablet Take 1 mg by mouth 2 (two) times daily as needed. For nerves       . hydrochlorothiazide (HYDRODIURIL) 25 MG tablet Take 25 mg by mouth daily as  needed. fluid       . HYDROmorphone (DILAUDID) 2 MG tablet Take 2 mg by mouth every 6 (six) hours as needed.       . pantoprazole (PROTONIX) 40 MG tablet Take 40 mg by mouth daily.        No current facility-administered medications for this visit.    Allergies as of 09/29/2012  . (No Known Allergies)    Family History  Problem Relation Age of Onset  . Colon cancer Neg Hx     History   Social History  . Marital Status: Married    Spouse Name: N/A    Number of Children: N/A  . Years of Education: N/A   Social History Main Topics  . Smoking status: Current Every Day Smoker -- 0.25 packs/day for 15 years    Types: Cigarettes  . Smokeless tobacco: None  . Alcohol Use: No  . Drug Use: No  . Sexually Active: None   Other Topics Concern  . None   Social History Narrative  . None    Review of Systems: Negative unless mentioned in HPI  Physical Exam: BP 150/83  Pulse 81  Temp(Src) 97.2 F (36.2 C) (Oral)  Ht 5' 5" (1.651 m)  Wt 139 lb 9.6 oz (63.322 kg)  BMI 23.23 kg/m2 General:   Alert and oriented. No distress noted. Pleasant and cooperative.  Head:  Normocephalic and atraumatic. Eyes:  Conjuctiva clear without scleral icterus.  Mouth:  Oral mucosa pink and moist. Good dentition. No lesions. Abdomen:  +BS, soft, TTP AT INCISION SITES, LOWER ABDOMEN, LLQ, SUPRAPUBIC and non-distended. No rebound or guarding. No HSM or masses noted. Msk:  Symmetrical without gross deformities. Normal posture. Extremities:  Without edema. Neurologic:  Alert and  oriented x4;  grossly normal neurologically. Skin:  Intact without significant lesions or rashes. Psych:  Alert and cooperative. Normal mood and affect. APPEARS IN BETTER SPIRITS, MORE ENCOURAGED

## 2012-10-10 ENCOUNTER — Ambulatory Visit (INDEPENDENT_AMBULATORY_CARE_PROVIDER_SITE_OTHER): Payer: Medicare Other | Admitting: Surgery

## 2012-10-10 ENCOUNTER — Encounter (INDEPENDENT_AMBULATORY_CARE_PROVIDER_SITE_OTHER): Payer: Self-pay | Admitting: Surgery

## 2012-10-10 VITALS — BP 128/70 | HR 70 | Temp 98.2°F | Resp 16 | Ht 66.0 in | Wt 135.8 lb

## 2012-10-10 DIAGNOSIS — R109 Unspecified abdominal pain: Secondary | ICD-10-CM

## 2012-10-10 NOTE — Patient Instructions (Signed)
Smoking Cessation Quitting smoking is important to your health and has many advantages. However, it is not always easy to quit since nicotine is a very addictive drug. Often times, people try 3 times or more before being able to quit. This document explains the best ways for you to prepare to quit smoking. Quitting takes hard work and a lot of effort, but you can do it. ADVANTAGES OF QUITTING SMOKING  You will live longer, feel better, and live better.  Your body will feel the impact of quitting smoking almost immediately.  Within 20 minutes, blood pressure decreases. Your pulse returns to its normal level.  After 8 hours, carbon monoxide levels in the blood return to normal. Your oxygen level increases.  After 24 hours, the chance of having a heart attack starts to decrease. Your breath, hair, and body stop smelling like smoke.  After 48 hours, damaged nerve endings begin to recover. Your sense of taste and smell improve.  After 72 hours, the body is virtually free of nicotine. Your bronchial tubes relax and breathing becomes easier.  After 2 to 12 weeks, lungs can hold more air. Exercise becomes easier and circulation improves.  The risk of having a heart attack, stroke, cancer, or lung disease is greatly reduced.  After 1 year, the risk of coronary heart disease is cut in half.  After 5 years, the risk of stroke falls to the same as a nonsmoker.  After 10 years, the risk of lung cancer is cut in half and the risk of other cancers decreases significantly.  After 15 years, the risk of coronary heart disease drops, usually to the level of a nonsmoker.  If you are pregnant, quitting smoking will improve your chances of having a healthy baby.  The people you live with, especially any children, will be healthier.  You will have extra money to spend on things other than cigarettes. QUESTIONS TO THINK ABOUT BEFORE ATTEMPTING TO QUIT You may want to talk about your answers with your  caregiver.  Why do you want to quit?  If you tried to quit in the past, what helped and what did not?  What will be the most difficult situations for you after you quit? How will you plan to handle them?  Who can help you through the tough times? Your family? Friends? A caregiver?  What pleasures do you get from smoking? What ways can you still get pleasure if you quit? Here are some questions to ask your caregiver:  How can you help me to be successful at quitting?  What medicine do you think would be best for me and how should I take it?  What should I do if I need more help?  What is smoking withdrawal like? How can I get information on withdrawal? GET READY  Set a quit date.  Change your environment by getting rid of all cigarettes, ashtrays, matches, and lighters in your home, car, or work. Do not let people smoke in your home.  Review your past attempts to quit. Think about what worked and what did not. GET SUPPORT AND ENCOURAGEMENT You have a better chance of being successful if you have help. You can get support in many ways.  Tell your family, friends, and co-workers that you are going to quit and need their support. Ask them not to smoke around you.  Get individual, group, or telephone counseling and support. Programs are available at local hospitals and health centers. Call your local health department for   information about programs in your area.  Spiritual beliefs and practices may help some smokers quit.  Download a "quit meter" on your computer to keep track of quit statistics, such as how long you have gone without smoking, cigarettes not smoked, and money saved.  Get a self-help book about quitting smoking and staying off of tobacco. [place] yourself from urges to smoke. Talk to someone, go for a walk, or occupy your time with a task.  Change your normal routine. Take a different route to work. Drink tea instead of coffee.  Eat breakfast in a different place.  Reduce your stress. Take a hot bath, exercise, or read a book.  Plan something enjoyable to do every day. Reward yourself for not smoking.  Explore interactive web-based programs that specialize in helping you quit. GET MEDICINE AND USE IT CORRECTLY Medicines can help you stop smoking and decrease the urge to smoke. Combining medicine with the above behavioral methods and support can greatly increase your chances of successfully quitting smoking.  Nicotine replacement therapy helps deliver nicotine to your body without the negative effects and risks of smoking. Nicotine replacement therapy includes nicotine gum, lozenges, inhalers, nasal sprays, and skin patches. Some may be available over-the-counter and others require a prescription.  Antidepressant medicine helps people abstain from smoking, but how this works is unknown. This medicine is available by prescription.  Nicotinic receptor partial agonist medicine simulates the effect of nicotine in your brain. This medicine is available by prescription. Ask your caregiver for advice about which medicines to use and how to use them based on your health history. Your caregiver will tell you what side effects to look out for if you choose to be on a medicine or therapy. Carefully read the information on the package. Do not use any other product containing nicotine while using a nicotine replacement product.  RELAPSE OR DIFFICULT SITUATIONS Most relapses occur within the first 3 months after quitting. Do not be discouraged if you start smoking again. Remember, most people try several times before finally quitting. You may have symptoms of withdrawal because your body is used to nicotine. You may crave cigarettes, be irritable, feel very hungry, cough often, get headaches, or have difficulty concentrating. The withdrawal symptoms are only temporary. They are strongest when you first quit, but they will go away within  10 14 days. To reduce the chances of relapse, try to:  Avoid drinking alcohol. Drinking lowers your chances of successfully quitting.  Reduce the amount of caffeine you consume. Once you quit smoking, the amount of caffeine in your body increases and can give you symptoms, such as a rapid heartbeat, sweating, and anxiety.  Avoid smokers because they can make you want to smoke.  Do not let weight gain distract you. Many smokers will gain weight when they quit, usually less than 10 pounds. Eat a healthy diet and stay active. You can always lose the weight gained after you quit.  Find ways to improve your mood other than smoking. FOR MORE INFORMATION  www.smokefree.gov  Document Released: 03/17/2001 Document Revised: 09/22/2011 Document Reviewed: 07/02/2011 [facility] Patient Information 2014 [place], [place]. High-Fiber Diet Fiber is found in fruits, vegetables, and grains. A high-fiber diet encourages the addition of more whole grains, legumes, fruits, and vegetables in your diet. The recommended amount of fiber for adult males is 38 g per day. For adult females, it is 25 g per day. Pregnant and lactating women should get 28 g of  fiber per day. If you have a digestive or bowel problem, ask your caregiver for advice before adding high-fiber foods to your diet. Eat a variety of high-fiber foods instead of only a select few type of foods.  PURPOSE  To increase stool bulk.  To make bowel movements more regular to prevent constipation.  To lower cholesterol.  To prevent overeating. WHEN IS THIS DIET USED?  It may be used if you have constipation and hemorrhoids.  It may be used if you have uncomplicated diverticulosis (intestine condition) and irritable bowel syndrome.  It may be used if you need help with weight management.  It may be used if you want to add it to your diet as a protective measure against atherosclerosis, diabetes, and cancer. SOURCES OF FIBER  Whole-grain breads and  cereals.  Fruits, such as apples, oranges, bananas, berries, prunes, and pears.  Vegetables, such as green peas, carrots, sweet potatoes, beets, broccoli, cabbage, spinach, and artichokes.  Legumes, such split peas, soy, lentils.  Almonds. FIBER CONTENT IN FOODS Starches and Grains / Dietary Fiber (g)  Cheerios, 1 cup / 3 g  Corn Flakes cereal, 1 cup / 0.7 g  Rice crispy treat cereal, 1 cup / 0.3 g  Instant oatmeal (cooked),  cup / 2 g  Frosted wheat cereal, 1 cup / 5.1 g  Brown, long-grain rice (cooked), 1 cup / 3.5 g  White, long-grain rice (cooked), 1 cup / 0.6 g  Enriched macaroni (cooked), 1 cup / 2.5 g Legumes / Dietary Fiber (g)  Baked beans (canned, plain, or vegetarian),  cup / 5.2 g  Kidney beans (canned),  cup / 6.8 g  Pinto beans (cooked),  cup / 5.5 g Breads and Crackers / Dietary Fiber (g)  Plain or honey graham crackers, 2 squares / 0.7 g  Saltine crackers, 3 squares / 0.3 g  Plain, salted pretzels, 10 pieces / 1.8 g  Whole-wheat bread, 1 slice / 1.9 g  White bread, 1 slice / 0.7 g  Raisin bread, 1 slice / 1.2 g  Plain bagel, 3 oz / 2 g  Flour tortilla, 1 oz / 0.9 g  Corn tortilla, 1 small / 1.5 g  Hamburger or hotdog bun, 1 small / 0.9 g Fruits / Dietary Fiber (g)  Apple with skin, 1 medium / 4.4 g  Sweetened applesauce,  cup / 1.5 g  Banana,  medium / 1.5 g  Grapes, 10 grapes / 0.4 g  Orange, 1 small / 2.3 g  Raisin, 1.5 oz / 1.6 g  Melon, 1 cup / 1.4 g Vegetables / Dietary Fiber (g)  Green beans (canned),  cup / 1.3 g  Carrots (cooked),  cup / 2.3 g  Broccoli (cooked),  cup / 2.8 g  Peas (cooked),  cup / 4.4 g  Mashed potatoes,  cup / 1.6 g  Lettuce, 1 cup / 0.5 g  Corn (canned),  cup / 1.6 g  Tomato,  cup / 1.1 g Document Released: 03/23/2005 Document Revised: 09/22/2011 Document Reviewed: 06/25/2011 [facility] Patient Information 2014 [place], [place].

## 2012-10-10 NOTE — Progress Notes (Signed)
Patient ID: [name], female   DOB: 05-09-1945, 68 y.o.   MRN: [id number]  Chief Complaint  Patient presents with  . New Evaluation    eval abd pain    HPI [name] is a 66 y.o. female.  Patient seen today at the request of Dr.Knolton 4 lower of bowel pain. The pain is severe requiring narcotics. The pain is less when she is active. She does have history of constipation with intermixed diarrhea she states. She has had multiple bowel operations. She's had extensive workup including CT scan, CT angiography, upper and lower endoscopy which were all normal. The pain is sharp located in both lower quadrants. It is intermittent but severe enough to require narcotics. Occasional blood in stool. She tubular  adenoma removed.  HPI  Past Medical History  Diagnosis Date  . Hypertension   . GERD (gastroesophageal reflux disease)   . Ulcer     Billroth I  . Chronic back pain   . Arthritis   . Thyroid disease     Past Surgical History  Procedure Laterality Date  . Back surgery      X4  . Cholecystectomy    . Abdominal hysterectomy    . Colonoscopy   05/18/2003    [id number] colonoscopy/ Internal hemorrhoids.  Otherwise, normal rectum  . Esophagogastroduodenoscopy  05/18/2003    RMR:. Normal esophagus/Adenomatous-appearing mucosa at the anastomosis  . Colonoscopy  08/12/2010    Dr. Arnoldo Morale: cecum visualized and normal, colon and rectum normal. Torturous colon  . Esophagogastroduodenoscopy  08/12/2010    Dr. Arnoldo Morale: anastomosis widely patent, no ulcerations, CLO test negative  . Bilroth i procedure    . Colonoscopy N/A 09/07/2012    RMR: tubular adenoma, lipoma. Due for surveillance in 2021  . Stomach surgery      removed partial stomach    Family History  Problem Relation Age of Onset  . Colon cancer Neg Hx   . Cancer Sister   . Cancer Brother   . Cancer Maternal Aunt     Social History History  Substance Use Topics  . Smoking status: Current  Every Day Smoker -- 0.25 packs/day for 15 years    Types: Cigarettes  . Smokeless tobacco: Never Used  . Alcohol Use: No    No Known Allergies  Current Outpatient Prescriptions  Medication Sig Dispense Refill  . benazepril-hydrochlorthiazide (LOTENSIN HCT) 20-25 MG per tablet Take 1 tablet by mouth daily.      . clonazePAM (KLONOPIN) 1 MG tablet Take 1 mg by mouth 2 (two) times daily as needed. For nerves       . hydrochlorothiazide (HYDRODIURIL) 25 MG tablet Take 25 mg by mouth daily as needed. fluid       . HYDROcodone-acetaminophen (NORCO) 10-325 MG per tablet Take 1 tablet by mouth every 6 (six) hours as needed for pain.      Marland Kitchen HYDROmorphone (DILAUDID) 2 MG tablet Take 2 mg by mouth every 6 (six) hours as needed.       . pantoprazole (PROTONIX) 40 MG tablet Take 40 mg by mouth daily.        No current facility-administered medications for this visit.    Review of Systems Review of Systems  Constitutional: Negative.   HENT: Negative.   Eyes: Negative.   Respiratory: Negative.   Cardiovascular: Negative.   Gastrointestinal: Positive for abdominal pain, diarrhea and constipation.  Endocrine: Negative.   Genitourinary: Negative.   Musculoskeletal: Positive for back pain and arthralgias.  Allergic/Immunologic: Negative.   Hematological: Negative.   Psychiatric/Behavioral: The patient is nervous/anxious.     Blood pressure 128/70, pulse 70, temperature 98.2 F (36.8 C), temperature source Temporal, resp. rate 16, height _0  (1.676 m), weight 135 lb 12.8 oz (61.598 kg).  Physical Exam Physical Exam  Constitutional: She is oriented to person, place, and time. She appears well-developed and well-nourished.  HENT:  Head: Normocephalic and atraumatic.  Eyes: EOM are normal. Pupils are equal, round, and reactive to light.  Neck: Normal range of motion. Neck supple.  Cardiovascular: Normal rate and regular rhythm.   Pulmonary/Chest: Effort normal and breath sounds normal.    Abdominal: There is tenderness in the right lower quadrant and left lower quadrant. No hernia. Hernia confirmed negative in the ventral area, confirmed negative in the right inguinal area and confirmed negative in the left inguinal area.    Musculoskeletal: Normal range of motion.  Neurological: She is alert and oriented to person, place, and time.  Skin: Skin is warm and dry.  Psychiatric: She has a normal mood and affect. Her behavior is normal. Judgment and thought content normal.    Data Reviewed Technique: Multidetector CT imaging of the abdomen and pelvis was  performed following the standard protocol during bolus  administration of intravenous contrast.  Contrast: 35m OMNIPAQUE IOHEXOL 300 MG/ML SOLN, 1 OMNIPAQUE  IOHEXOL 300 MG/ML SOLN, 840mOMNIPAQUE IOHEXOL 300 MG/ML SOLN  Comparison: CT chest 05/09/2012 and CT abdomen pelvis and  10/28/2006.  Findings: Lung bases show no acute findings. Heart size normal.  No pericardial or pleural effusion.  A focal area of sub centimeter hyperattenuation in the peripheral  right hepatic lobe (image 9) may represent a flash fill hemangioma  or perfusion anomaly. Liver is otherwise unremarkable.  Cholecystectomy. Extrahepatic bile duct prominence is unchanged.  Adrenal glands, kidneys, spleen, pancreas, stomach and bowel are  unremarkable. Small bowel mesenteric haziness and nodularity are  unchanged. Postoperative changes are seen along the right lateral  ventral abdominal wall.  No pathologically enlarged lymph nodes. No free fluid.  Atherosclerotic calcification of the arterial vasculature without  abdominal aortic aneurysm. No worrisome lytic or sclerotic  lesions. Postoperative changes are seen in the spine.  IMPRESSION:  No findings to explain the patient's weight loss.    Assessment    Abdominal pain    Plan    I had a lengthy discussion with the patient and her son today. It's unlikely adhesions are causing her pain.  She has diffuse abdominal pain and history of weight loss and difficulty with bowel function. She does smoke and does have high anxiety. CT scan and CT angio were normal. Upper and lower endoscopy normal. At this point in time, surgery is of no benefit and the complications outweigh any benefit. Recommend dietary modification, smoking cessation, reduction in caffeine intake, regular exercise and consideration of the other medications recommended by gastroenterology.       Daaiel Starlin A. 10/10/2012, 2:23 PM

## 2012-10-24 ENCOUNTER — Other Ambulatory Visit (HOSPITAL_COMMUNITY): Payer: Self-pay | Admitting: Family Medicine

## 2012-10-24 DIAGNOSIS — Z139 Encounter for screening, unspecified: Secondary | ICD-10-CM

## 2012-10-25 ENCOUNTER — Ambulatory Visit (HOSPITAL_COMMUNITY): Payer: Medicare Other

## 2012-10-27 ENCOUNTER — Ambulatory Visit (HOSPITAL_COMMUNITY)
Admission: RE | Admit: 2012-10-27 | Discharge: 2012-10-27 | Disposition: A | Discharge status: Home / Self Care | Payer: Medicare Other | Source: Ambulatory Visit | Attending: Family Medicine | Admitting: Family Medicine

## 2012-10-27 DIAGNOSIS — Z139 Encounter for screening, unspecified: Secondary | ICD-10-CM

## 2012-10-27 DIAGNOSIS — Z1231 Encounter for screening mammogram for malignant neoplasm of breast: Secondary | ICD-10-CM | POA: Insufficient documentation

## 2012-11-01 ENCOUNTER — Ambulatory Visit (INDEPENDENT_AMBULATORY_CARE_PROVIDER_SITE_OTHER): Payer: Medicare Other | Admitting: Internal Medicine

## 2012-11-01 ENCOUNTER — Encounter: Payer: Self-pay | Admitting: Internal Medicine

## 2012-11-01 VITALS — BP 133/81 | HR 76 | Temp 98.4°F | Ht 65.0 in | Wt 138.2 lb

## 2012-11-01 DIAGNOSIS — R109 Unspecified abdominal pain: Secondary | ICD-10-CM

## 2012-11-01 NOTE — Progress Notes (Signed)
Primary Care Physician:  Yancarlos Berthold Bellow, MD Primary Gastroenterologist:  Dr. Gala Romney  Pre-Procedure History & Physical: HPI:  [name] is a 67 y.o. female here for followup of chronic abdominal pain. States for the past 2 weeks she has had no abdominal pain. On moxifloxacin for URI prescribed by Dr. Karie Kirks. He also prescribed hydromorphone 2 mg tablets-she has not needed to take any because she states abdominal pain has subsided spontaneously. Still has trouble with bowel function having 1 bowel movement daily to every other day. No bleeding. Takes milk of magnesia at home. Has MiraLax but doesn't use it is much. Saw Dr. Brantley Stage he did not feel that this nice lady had any surgical problems.  Weight stable.  Past Medical History  Diagnosis Date  . Hypertension   . GERD (gastroesophageal reflux disease)   . Ulcer     Billroth I  . Chronic back pain   . Arthritis   . Thyroid disease     Past Surgical History  Procedure Laterality Date  . Back surgery      X4  . Cholecystectomy    . Abdominal hysterectomy    . Colonoscopy   05/18/2003    [id number] colonoscopy/ Internal hemorrhoids.  Otherwise, normal rectum  . Esophagogastroduodenoscopy  05/18/2003    RMR:. Normal esophagus/Adenomatous-appearing mucosa at the anastomosis  . Colonoscopy  08/12/2010    Dr. Arnoldo Morale: cecum visualized and normal, colon and rectum normal. Torturous colon  . Esophagogastroduodenoscopy  08/12/2010    Dr. Arnoldo Morale: anastomosis widely patent, no ulcerations, CLO test negative  . Bilroth i procedure    . Colonoscopy N/A 09/07/2012    RMR: tubular adenoma, lipoma. Due for surveillance in 2021  . Stomach surgery      removed partial stomach    Prior to Admission medications   Medication Sig Start Date End Date Taking? Authorizing Provider  benazepril-hydrochlorthiazide (LOTENSIN HCT) 20-25 MG per tablet Take 1 tablet by mouth daily.   Yes Historical Provider, MD  clonazePAM (KLONOPIN) 1 MG  tablet Take 1 mg by mouth 2 (two) times daily as needed. For nerves    Yes Historical Provider, MD  hydrochlorothiazide (HYDRODIURIL) 25 MG tablet Take 25 mg by mouth daily as needed. fluid    Yes Historical Provider, MD  HYDROmorphone (DILAUDID) 2 MG tablet Take 2 mg by mouth every 6 (six) hours as needed.  09/23/12  Yes Historical Provider, MD  moxifloxacin (AVELOX) 400 MG tablet Take 400 mg by mouth daily.  10/25/12  Yes Historical Provider, MD  pantoprazole (PROTONIX) 40 MG tablet Take 40 mg by mouth daily.  08/24/12  Yes Historical Provider, MD    Allergies as of 11/01/2012  . (No Known Allergies)    Family History  Problem Relation Age of Onset  . Colon cancer Neg Hx   . Cancer Sister   . Cancer Brother   . Cancer Maternal Aunt     History   Social History  . Marital Status: Married    Spouse Name: N/A    Number of Children: N/A  . Years of Education: N/A   Occupational History  . Not on file.   Social History Main Topics  . Smoking status: Current Every Day Smoker -- 0.25 packs/day for 15 years    Types: Cigarettes  . Smokeless tobacco: Never Used  . Alcohol Use: No  . Drug Use: No  . Sexually Active: Not on file   Other Topics Concern  . Not on file   Social  History Narrative  . No narrative on file    Review of Systems: See HPI, otherwise negative ROS  Physical Exam: BP 133/81  Pulse 76  Temp(Src) 98.4 F (36.9 C) (Oral)  Ht 5' 5" (1.651 m)  Wt 138 lb 3.2 oz (62.687 kg)  BMI 23 kg/m2 General:   Alert,  Well-developed, well-nourished, pleasant and cooperative in NAD Skin:  Intact without significant lesions or rashes. Eyes:  Disconjugate gaze. Left eye. Sclera clear, no icterus.   Conjunctiva pink. Ears:  Normal auditory acuity. Nose:  No deformity, discharge,  or lesions. Mouth:  No deformity or lesions. Neck:  Supple; no masses or thyromegaly. No significant cervical adenopathy. Lungs:  Clear throughout to auscultation.   No wheezes, crackles, or  rhonchi. No acute distress. Heart:  Regular rate and rhythm; no murmurs, clicks, rubs,  or gallops. Abdomen: Non-distended, normal bowel sounds.  Soft and nontender without appreciable mass or hepatosplenomegaly.  Pulses:  Normal pulses noted. Extremities:  Without clubbing or edema.  Impression/Plan:  Pleasant 67 year old with chronic abdominal pain  -  symptoms have settled down some over the past couple of weeks. Recent URI treated with antibiotics.Likely has an element of abdominal wall pain and possibly postoperative adhesions. Constipation may also be a factor. She's had a fairly extensive negative workup thus far.  She was reassured today.  Recommendations:   Minimize use of milk of magnesia. However for this nice lady use of MiraLax 17 g orally daily to twice daily to facilitate a bowel movement daily to every other day. She should minimize use of her coccyx as this could worsen her constipation. No further evaluation warts at this time.  Office followup here in 6 months and when necessary.

## 2012-11-01 NOTE — Patient Instructions (Addendum)
Limit use of Hydromorphone / pain medication  Use Miralax up to 2 times daily for constipation  Office visit with [place] in 6 months

## 2012-11-14 ENCOUNTER — Encounter: Payer: Self-pay | Admitting: Internal Medicine

## 2013-04-07 NOTE — Progress Notes (Signed)
REVIEWED.  

## 2013-10-04 ENCOUNTER — Other Ambulatory Visit (HOSPITAL_COMMUNITY): Payer: Self-pay | Admitting: Family Medicine

## 2013-10-04 DIAGNOSIS — Z139 Encounter for screening, unspecified: Secondary | ICD-10-CM

## 2013-10-30 ENCOUNTER — Ambulatory Visit (HOSPITAL_COMMUNITY)
Admission: RE | Admit: 2013-10-30 | Discharge: 2013-10-30 | Disposition: A | Discharge status: Home / Self Care | Payer: Medicare Other | Source: Ambulatory Visit | Attending: Family Medicine | Admitting: Family Medicine

## 2013-10-30 DIAGNOSIS — Z1231 Encounter for screening mammogram for malignant neoplasm of breast: Secondary | ICD-10-CM | POA: Insufficient documentation

## 2013-10-30 DIAGNOSIS — Z139 Encounter for screening, unspecified: Secondary | ICD-10-CM

## 2014-08-06 ENCOUNTER — Other Ambulatory Visit: Payer: Self-pay | Admitting: Neurosurgery

## 2014-08-06 DIAGNOSIS — M5416 Radiculopathy, lumbar region: Secondary | ICD-10-CM

## 2014-08-14 ENCOUNTER — Ambulatory Visit
Admission: RE | Admit: 2014-08-14 | Discharge: 2014-08-14 | Disposition: A | Discharge status: Home / Self Care | Payer: Medicare Other | Source: Ambulatory Visit | Attending: Neurosurgery | Admitting: Neurosurgery

## 2014-08-14 DIAGNOSIS — M5416 Radiculopathy, lumbar region: Secondary | ICD-10-CM

## 2014-08-14 MED ORDER — MEPERIDINE HCL 100 MG/ML IJ SOLN
100.0000 mg | Freq: Once | INTRAMUSCULAR | Status: AC
  Administered 2014-08-14: 100 mg via INTRAMUSCULAR

## 2014-08-14 MED ORDER — DIAZEPAM 5 MG PO TABS
5.0000 mg | ORAL_TABLET | Freq: Once | ORAL | Status: AC
  Administered 2014-08-14: 5 mg via ORAL

## 2014-08-14 MED ORDER — IOHEXOL 180 MG/ML  SOLN
15.0000 mL | Freq: Once | INTRAMUSCULAR | Status: AC | PRN
  Administered 2014-08-14: 15 mL via INTRATHECAL

## 2014-08-14 MED ORDER — ONDANSETRON HCL 4 MG/2ML IJ SOLN
4.0000 mg | Freq: Once | INTRAMUSCULAR | Status: AC
  Administered 2014-08-14: 4 mg via INTRAMUSCULAR

## 2014-08-14 NOTE — Discharge Instructions (Signed)

## 2015-01-29 ENCOUNTER — Other Ambulatory Visit (HOSPITAL_COMMUNITY): Payer: Self-pay | Admitting: Family Medicine

## 2015-01-29 DIAGNOSIS — Z1231 Encounter for screening mammogram for malignant neoplasm of breast: Secondary | ICD-10-CM

## 2015-02-05 ENCOUNTER — Ambulatory Visit (HOSPITAL_COMMUNITY): Payer: Medicare Other | Attending: Orthopedic Surgery | Admitting: Physical Therapy

## 2015-02-05 DIAGNOSIS — R6889 Other general symptoms and signs: Secondary | ICD-10-CM

## 2015-02-05 DIAGNOSIS — R262 Difficulty in walking, not elsewhere classified: Secondary | ICD-10-CM | POA: Insufficient documentation

## 2015-02-05 DIAGNOSIS — R29898 Other symptoms and signs involving the musculoskeletal system: Secondary | ICD-10-CM | POA: Diagnosis not present

## 2015-02-05 NOTE — Patient Instructions (Signed)
BRIDGING  While lying on your back, tighten your lower abdominals, squeeze your buttocks and then raise your buttocks off the floor/bed as creating a "Bridge" with your body.   STRAIGHT LEG RAISE - SLR  While lying or sitting, raise up your leg with a straight knee.  Keep the opposite knee bent with the foot planted to the ground.   SINGLE KNEE TO CHEST STRETCH - SKTC  While lying on your back, use your hands and gently draw up a knee towards your chest.   Keep your other knee straight and lying on the ground.   HIP ABDUCTION - STANDING   While standing, raise your leg out to the side. Keep your knee straight and maintain your toes pointed forward the entire time.    Use your arms for support if needed for balance and safety.    HIP EXTENSION - STANDING  While standing, move your leg back as shown.  Use your arms for support if needed for balance and safety.     Chair Squat  Stand in front of chair of appropriate height(lower chair increases difficulty, higher chair is less difficult) Feet about shoulder width apart, sit hips back as if to sit into chair, keeping knees behind toes. Tap chair and return to upright standing, repeat.   **Keep knees from turning in, and do not let knee go over toes   SEATED HAMSTRING STRETCH  While seated, rest your heel on the floor with your knee straight and gently lean forward until a stretch is felt behind you knee/thigh.

## 2015-02-05 NOTE — Therapy (Signed)
[facility] [address] [place], [place], [address] Phone: [phone]   Fax:  [phone]  Physical Therapy Evaluation  Patient Details  Name: [name] MRN: [id number] Date of Birth: 1946-03-10 Referring Provider: Rod Can, MD  Encounter Date: 02/05/2015      PT End of Session - 02/05/15 1343    Visit Number 1   Number of Visits 1   Authorization Type [employer] Medicare   Authorization Time Period 02/05/15-04/07/15   PT Start Time 1300   PT Stop Time 1336   PT Time Calculation (min) 36 min   Activity Tolerance Patient tolerated treatment well   Behavior During Therapy [facility] for tasks assessed/performed      Past Medical History  Diagnosis Date  . Hypertension   . GERD (gastroesophageal reflux disease)   . Ulcer     Billroth I  . Chronic back pain   . Arthritis   . Thyroid disease     Past Surgical History  Procedure Laterality Date  . Back surgery      X4  . Cholecystectomy    . Abdominal hysterectomy    . Colonoscopy   05/18/2003    JSH:FWYOVZCHYI colonoscopy/ Internal hemorrhoids.  Otherwise, normal rectum  . Esophagogastroduodenoscopy  05/18/2003    RMR:. Normal esophagus/Adenomatous-appearing mucosa at the anastomosis  . Colonoscopy  08/12/2010    Dr. Arnoldo Morale: cecum visualized and normal, colon and rectum normal. Torturous colon  . Esophagogastroduodenoscopy  08/12/2010    Dr. Arnoldo Morale: anastomosis widely patent, no ulcerations, CLO test negative  . Bilroth i procedure    . Colonoscopy N/A 09/07/2012    RMR: tubular adenoma, lipoma. Due for surveillance in 2021  . Stomach surgery      removed partial stomach    There were no vitals filed for this visit.  Visit Diagnosis:  Weakness of both legs  Difficulty walking  Decreased functional activity tolerance      Subjective Assessment - 02/05/15 1307    Subjective Pt reports that she has been having back pain and leg weakness when standing, as  well as pain in her R hip when she lays on her R side. She has difficulty standing for long periods of time, and has had some trouble with walking. She feels that she has been having to sit down a lot. Pt reports that she would like to just have this one session for PT and be given exercises to do at home, because she cannot afford the copay.    How long can you sit comfortably? 15-20 minutes   How long can you stand comfortably? 45 minutes   How long can you walk comfortably? 5-10 minutes   Patient Stated Goals Get exercises to do at home to improve pain   Currently in Pain? No/denies   Pain Score 0-No pain            [facility] PT Assessment - 02/05/15 0001    Assessment   Medical Diagnosis R trochanteric bursitis   Referring Provider Rod Can, MD   Next MD Visit January   Prior Therapy no   Balance Screen   Has the patient fallen in the past 6 months Yes   How many times? 1   Has the patient had a decrease in activity level because of a fear of falling?  No   Is the patient reluctant to leave their home because of a fear of falling?  No   Home Environment   Living  Environment Private residence   Living Arrangements Spouse/significant other   Type of [place] to enter   Entrance Stairs-Number of Steps 5   Entrance Stairs-Rails Left;Right   Prior Function   Level of [place] Retired   Leisure Enjoys going to church, watching TV, gardening   Observation/Other Assessments   Focus on Therapeutic Outcomes (FOTO)  47% limited   ROM / Strength   AROM / PROM / Strength AROM;Strength   AROM   Overall AROM  Within functional limits for tasks performed   Strength   Strength Assessment Site Hip;Knee   Right Hip Flexion 4/5   Right Hip Extension 3-/5   Right Hip ABduction 4-/5   Left Hip Flexion 4/5   Left Hip Extension 3-/5   Left Hip ABduction 4-/5   Right/Left Knee Right;Left   Right Knee Flexion 4/5   Right Knee Extension  5/5   Left Knee Flexion 4/5   Left Knee Extension 5/5             PT Education - 2015/02/17 1343    Education provided Yes   Education Details Pt given HEP for functional stretching and strengthening   Person(s) Educated Patient   Methods Explanation;Demonstration;Handout   Comprehension Verbalized understanding;Returned demonstration          PT Short Term Goals - 02-17-2015 1640    PT SHORT TERM GOAL #1   Title Pt will be independent with HEP.    Time 1   Period Days   Status Achieved                  Plan - 02/17/15 1635    Clinical Impression Statement Pt presents to PT with c/o stiffness in lumbar spine and BLE weakness. Upon examination, pt demonstrates weakness of BLE, tightness of BLE musculature, and decreased functional activity tolerance. Pt reports that she wants to get HEP because she cannot afford the copay. Pt was given HEP for functional strengthening and stretching, and was encouraged to join the [facility].    Pt will benefit from skilled therapeutic intervention in order to improve on the following deficits Decreased activity tolerance;Decreased strength;Difficulty walking   Rehab Potential Good   PT Frequency 1x / week   PT Duration --  1 day   PT Treatment/Interventions ADLs/Self Care Home Management;Therapeutic exercise;Therapeutic activities   PT Home Exercise Plan Given for strengthening and stretching          G-Codes - 02/17/2015 1641    Functional Assessment Tool Used FOTO   Functional Limitation Mobility: Walking and moving around   Mobility: Walking and Moving Around Current Status [phone]) At least 40 percent but less than 60 percent impaired, limited or restricted   Mobility: Walking and Moving Around Goal Status [phone]) At least 40 percent but less than 60 percent impaired, limited or restricted   Mobility: Walking and Moving Around Discharge Status [phone]) At least 40 percent but less than 60 percent impaired, limited or restricted        Problem List Patient Active Problem List   Diagnosis Date Noted  . Abdominal pain, other specified site 08/25/2012  . Loss of weight 08/25/2012    Hilma Favors, PT, DPT [phone] 17-Feb-2015, 4:43 PM  [facility] [address] [place], [place], [address] Phone: [phone]   Fax:  [phone]  Name: IYLAH DWORKIN MRN: [id number] Date of Birth: 1946/01/03

## 2015-02-06 ENCOUNTER — Ambulatory Visit (HOSPITAL_COMMUNITY)
Admission: RE | Admit: 2015-02-06 | Discharge: 2015-02-06 | Disposition: A | Discharge status: Home / Self Care | Payer: Medicare Other | Source: Ambulatory Visit | Attending: Family Medicine | Admitting: Family Medicine

## 2015-02-06 DIAGNOSIS — Z1231 Encounter for screening mammogram for malignant neoplasm of breast: Secondary | ICD-10-CM | POA: Insufficient documentation

## 2015-07-24 ENCOUNTER — Encounter (HOSPITAL_COMMUNITY): Payer: Self-pay

## 2016-01-14 ENCOUNTER — Ambulatory Visit (HOSPITAL_COMMUNITY)
Admission: RE | Admit: 2016-01-14 | Discharge: 2016-01-14 | Disposition: A | Discharge status: Home / Self Care | Payer: Medicare Other | Source: Ambulatory Visit | Attending: Ophthalmology | Admitting: Ophthalmology

## 2016-01-14 ENCOUNTER — Encounter (HOSPITAL_COMMUNITY)
Admission: RE | Disposition: A | Discharge status: Home / Self Care | Payer: Self-pay | Source: Ambulatory Visit | Attending: Ophthalmology

## 2016-01-14 ENCOUNTER — Encounter (HOSPITAL_COMMUNITY): Payer: Self-pay | Admitting: *Deleted

## 2016-01-14 DIAGNOSIS — M1990 Unspecified osteoarthritis, unspecified site: Secondary | ICD-10-CM | POA: Diagnosis not present

## 2016-01-14 DIAGNOSIS — Z79899 Other long term (current) drug therapy: Secondary | ICD-10-CM | POA: Insufficient documentation

## 2016-01-14 DIAGNOSIS — H2640 Unspecified secondary cataract: Secondary | ICD-10-CM | POA: Diagnosis present

## 2016-01-14 DIAGNOSIS — I10 Essential (primary) hypertension: Secondary | ICD-10-CM | POA: Diagnosis not present

## 2016-01-14 HISTORY — PX: YAG LASER APPLICATION: SHX6189

## 2016-01-14 SURGERY — TREATMENT, USING YAG LASER
Anesthesia: LOCAL | Laterality: Left

## 2016-01-14 MED ORDER — CYCLOPENTOLATE-PHENYLEPHRINE 0.2-1 % OP SOLN
1.0000 [drp] | OPHTHALMIC | Status: AC
  Administered 2016-01-14 (×3): 1 [drp] via OPHTHALMIC

## 2016-01-14 MED ORDER — CYCLOPENTOLATE-PHENYLEPHRINE 0.2-1 % OP SOLN
OPHTHALMIC | Status: AC
  Filled 2016-01-14: qty 2

## 2016-01-14 NOTE — H&P (Signed)
The patient was re examined and there is no change in the patients condition since the original H and P. 

## 2016-01-14 NOTE — Discharge Instructions (Signed)
[name]  01/14/2016     Instructions    Activity: No Restrictions.   Diet: Resume Diet you were on at home.   Pain Medication: Tylenol if Needed.   CONTACT YOUR DOCTOR IF YOU HAVE PAIN, REDNESS IN YOUR EYE, OR DECREASED VISION.   Follow-up 1:00 pm with [name], MD.   Dr. Gershon [name]: [phone]       If you find that you cannot contact your physician, but feel that your signs and   Symptoms warrant a physician's attention, call the Emergency Room at   [phone] ext.[phone].   Other{NA AND [id number]}.

## 2016-01-14 NOTE — Op Note (Signed)
Molly Steven [name] [name], MD  Procedure: Yag Capsulotomy  Yag Laser Self Test Completedyes. Procedure: Posterior Capsulotomy, Eye Protection Worn by Staff yes. Laser In Use Sign on Door yes.  Laser: Nd:YAG Spot Size: Fixed Burst Mode: III Power Setting: 3.4 mJ/burst Number of shots: 17 Total energy delivered: 55.67 mJ   The patient tolerated the procedure without difficulty. No complications were encountered.   The patient was discharged home with the instructions to continue all her current glaucoma medications, if any.   Patient instructed to go to office at 0100 for intraocular pressure check.  Patient verbalizes understanding of discharge instructions Yes.  .    Pre-Operative Diagnosis: After-Cataract, obscuring vision, 366.53 OS Post-Operative Diagnosis: After-Cataract, obscuring vision, 366.53 OS Date of Cataract Surgery: 01/14/2016

## 2016-01-17 ENCOUNTER — Encounter (HOSPITAL_COMMUNITY): Payer: Self-pay | Admitting: Ophthalmology

## 2016-01-28 ENCOUNTER — Encounter (HOSPITAL_COMMUNITY)
Admission: RE | Disposition: A | Discharge status: Home / Self Care | Payer: Self-pay | Source: Ambulatory Visit | Attending: Ophthalmology

## 2016-01-28 ENCOUNTER — Ambulatory Visit (HOSPITAL_COMMUNITY)
Admission: RE | Admit: 2016-01-28 | Discharge: 2016-01-28 | Disposition: A | Discharge status: Home / Self Care | Payer: Medicare Other | Source: Ambulatory Visit | Attending: Ophthalmology | Admitting: Ophthalmology

## 2016-01-28 ENCOUNTER — Encounter (HOSPITAL_COMMUNITY): Payer: Self-pay | Admitting: *Deleted

## 2016-01-28 DIAGNOSIS — H2640 Unspecified secondary cataract: Secondary | ICD-10-CM | POA: Insufficient documentation

## 2016-01-28 DIAGNOSIS — I10 Essential (primary) hypertension: Secondary | ICD-10-CM | POA: Insufficient documentation

## 2016-01-28 DIAGNOSIS — Z79899 Other long term (current) drug therapy: Secondary | ICD-10-CM | POA: Diagnosis not present

## 2016-01-28 DIAGNOSIS — M1990 Unspecified osteoarthritis, unspecified site: Secondary | ICD-10-CM | POA: Diagnosis not present

## 2016-01-28 HISTORY — PX: YAG LASER APPLICATION: SHX6189

## 2016-01-28 SURGERY — TREATMENT, USING YAG LASER
Anesthesia: LOCAL | Laterality: Right

## 2016-01-28 MED ORDER — CYCLOPENTOLATE-PHENYLEPHRINE 0.2-1 % OP SOLN
1.0000 [drp] | OPHTHALMIC | Status: AC
  Administered 2016-01-28 (×2): 1 [drp] via OPHTHALMIC
  Filled 2016-01-28: qty 2

## 2016-01-28 NOTE — Discharge Instructions (Signed)
HAYLI MILLIGAN  01/28/2016     Instructions    Activity: No Restrictions.   Diet: Resume Diet you were on at home.   Pain Medication: Tylenol if Needed.   CONTACT YOUR DOCTOR IF YOU HAVE PAIN, REDNESS IN YOUR EYE, OR DECREASED VISION.   Follow-up:1:00 with [name], MD.   Dr. Gershon [name]: [phone]     If you find that you cannot contact your physician, but feel that your signs and   Symptoms warrant a physician's attention, call the Emergency Room at   [phone] ext.[phone].

## 2016-01-28 NOTE — H&P (Signed)
The patient was re examined and there is no change in the patients condition since the original H and P. 

## 2016-01-28 NOTE — Op Note (Signed)
[name] T. Molly [name], MD  Procedure: Yag Capsulotomy  Yag Laser Self Test Completedyes. Procedure: Posterior Capsulotomy, Eye Protection Worn by Staff yes. Laser In Use Sign on Door yes.  Laser: Nd:YAG Spot Size: Fixed Burst Mode: III Power Setting: 3.4 mJ/burst Number of shots: 17 Total energy delivered: 55.58 mJ   The patient tolerated the procedure without difficulty. No complications were encountered.   The patient was discharged home with the instructions to continue all her current glaucoma medications, if any.   Patient instructed to go to office at 0100 for intraocular pressure check.  Patient verbalizes understanding of discharge instructions Yes.  .    Pre-Operative Diagnosis: After-Cataract, obscuring vision, 366.53 OD Post-Operative Diagnosis: After-Cataract, obscuring vision, 366.53 OD Date of Cataract Surgery: 11/26/2009

## 2016-01-30 ENCOUNTER — Encounter (HOSPITAL_COMMUNITY): Payer: Self-pay | Admitting: Ophthalmology

## 2016-03-09 ENCOUNTER — Other Ambulatory Visit (HOSPITAL_COMMUNITY): Payer: Self-pay | Admitting: Family Medicine

## 2016-03-09 DIAGNOSIS — Z1231 Encounter for screening mammogram for malignant neoplasm of breast: Secondary | ICD-10-CM

## 2016-03-12 ENCOUNTER — Ambulatory Visit (HOSPITAL_COMMUNITY)
Admission: RE | Admit: 2016-03-12 | Discharge: 2016-03-12 | Disposition: A | Discharge status: Home / Self Care | Payer: Medicare Other | Source: Ambulatory Visit | Attending: Family Medicine | Admitting: Family Medicine

## 2016-03-12 DIAGNOSIS — Z1231 Encounter for screening mammogram for malignant neoplasm of breast: Secondary | ICD-10-CM | POA: Diagnosis present

## 2016-11-17 ENCOUNTER — Ambulatory Visit (HOSPITAL_COMMUNITY)
Admission: RE | Admit: 2016-11-17 | Discharge: 2016-11-17 | Disposition: A | Discharge status: Home / Self Care | Payer: Medicare Other | Source: Ambulatory Visit | Attending: Family Medicine | Admitting: Family Medicine

## 2016-11-17 ENCOUNTER — Other Ambulatory Visit (HOSPITAL_COMMUNITY): Payer: Self-pay | Admitting: Family Medicine

## 2016-11-17 DIAGNOSIS — M5440 Lumbago with sciatica, unspecified side: Secondary | ICD-10-CM

## 2016-11-17 DIAGNOSIS — M5136 Other intervertebral disc degeneration, lumbar region: Secondary | ICD-10-CM | POA: Insufficient documentation

## 2016-11-23 ENCOUNTER — Ambulatory Visit (HOSPITAL_COMMUNITY)
Admission: RE | Admit: 2016-11-23 | Discharge: 2016-11-23 | Disposition: A | Discharge status: Home / Self Care | Payer: Medicare Other | Source: Ambulatory Visit | Attending: Family Medicine | Admitting: Family Medicine

## 2016-11-23 ENCOUNTER — Other Ambulatory Visit (HOSPITAL_COMMUNITY): Payer: Self-pay | Admitting: Family Medicine

## 2016-11-23 DIAGNOSIS — M5441 Lumbago with sciatica, right side: Secondary | ICD-10-CM

## 2016-11-23 DIAGNOSIS — M5126 Other intervertebral disc displacement, lumbar region: Secondary | ICD-10-CM | POA: Insufficient documentation

## 2016-11-23 DIAGNOSIS — Z981 Arthrodesis status: Secondary | ICD-10-CM | POA: Insufficient documentation

## 2016-11-23 DIAGNOSIS — M48061 Spinal stenosis, lumbar region without neurogenic claudication: Secondary | ICD-10-CM | POA: Diagnosis not present

## 2016-11-23 DIAGNOSIS — M5127 Other intervertebral disc displacement, lumbosacral region: Secondary | ICD-10-CM | POA: Diagnosis not present

## 2016-11-25 ENCOUNTER — Other Ambulatory Visit: Payer: Self-pay | Admitting: Family Medicine

## 2016-11-25 DIAGNOSIS — M5441 Lumbago with sciatica, right side: Secondary | ICD-10-CM

## 2016-12-01 ENCOUNTER — Ambulatory Visit
Admission: RE | Admit: 2016-12-01 | Discharge: 2016-12-01 | Disposition: A | Discharge status: Home / Self Care | Payer: Medicare Other | Source: Ambulatory Visit | Attending: Family Medicine | Admitting: Family Medicine

## 2016-12-01 DIAGNOSIS — M5441 Lumbago with sciatica, right side: Secondary | ICD-10-CM

## 2016-12-01 MED ORDER — METHYLPREDNISOLONE ACETATE 40 MG/ML INJ SUSP (RADIOLOG
120.0000 mg | Freq: Once | INTRAMUSCULAR | Status: AC
  Administered 2016-12-01: 120 mg via EPIDURAL

## 2016-12-01 MED ORDER — IOPAMIDOL (ISOVUE-M 200) INJECTION 41%
1.0000 mL | Freq: Once | INTRAMUSCULAR | Status: AC
  Administered 2016-12-01: 1 mL via EPIDURAL

## 2016-12-01 NOTE — Discharge Instructions (Signed)

## 2016-12-04 ENCOUNTER — Other Ambulatory Visit: Payer: Medicare Other

## 2016-12-08 ENCOUNTER — Other Ambulatory Visit: Payer: Medicare Other

## 2016-12-30 ENCOUNTER — Other Ambulatory Visit: Payer: Self-pay | Admitting: Family Medicine

## 2016-12-30 DIAGNOSIS — G8929 Other chronic pain: Secondary | ICD-10-CM

## 2016-12-30 DIAGNOSIS — M545 Low back pain, unspecified: Secondary | ICD-10-CM

## 2017-01-11 ENCOUNTER — Ambulatory Visit
Admission: RE | Admit: 2017-01-11 | Discharge: 2017-01-11 | Disposition: A | Discharge status: Home / Self Care | Payer: Medicare Other | Source: Ambulatory Visit | Attending: Family Medicine | Admitting: Family Medicine

## 2017-01-11 DIAGNOSIS — M545 Low back pain: Principal | ICD-10-CM

## 2017-01-11 DIAGNOSIS — G8929 Other chronic pain: Secondary | ICD-10-CM

## 2017-01-11 MED ORDER — METHYLPREDNISOLONE ACETATE 40 MG/ML INJ SUSP (RADIOLOG
120.0000 mg | Freq: Once | INTRAMUSCULAR | Status: AC
  Administered 2017-01-11: 120 mg via EPIDURAL

## 2017-01-11 MED ORDER — IOPAMIDOL (ISOVUE-M 200) INJECTION 41%
1.0000 mL | Freq: Once | INTRAMUSCULAR | Status: AC
  Administered 2017-01-11: 1 mL via EPIDURAL

## 2017-01-11 NOTE — Discharge Instructions (Signed)

## 2017-04-14 DIAGNOSIS — M1611 Unilateral primary osteoarthritis, right hip: Secondary | ICD-10-CM | POA: Insufficient documentation

## 2017-07-28 ENCOUNTER — Other Ambulatory Visit: Payer: Self-pay | Admitting: Family Medicine

## 2017-07-28 DIAGNOSIS — M545 Low back pain: Principal | ICD-10-CM

## 2017-07-28 DIAGNOSIS — G8929 Other chronic pain: Secondary | ICD-10-CM

## 2017-08-02 ENCOUNTER — Ambulatory Visit
Admission: RE | Admit: 2017-08-02 | Discharge: 2017-08-02 | Disposition: A | Discharge status: Home / Self Care | Payer: Medicare Other | Source: Ambulatory Visit | Attending: Family Medicine | Admitting: Family Medicine

## 2017-08-02 DIAGNOSIS — G8929 Other chronic pain: Secondary | ICD-10-CM

## 2017-08-02 DIAGNOSIS — M545 Low back pain: Principal | ICD-10-CM

## 2017-08-02 MED ORDER — METHYLPREDNISOLONE ACETATE 40 MG/ML INJ SUSP (RADIOLOG
120.0000 mg | Freq: Once | INTRAMUSCULAR | Status: AC
  Administered 2017-08-02: 120 mg via EPIDURAL

## 2017-08-02 MED ORDER — IOPAMIDOL (ISOVUE-M 200) INJECTION 41%
1.0000 mL | Freq: Once | INTRAMUSCULAR | Status: AC
  Administered 2017-08-02: 1 mL via EPIDURAL

## 2017-08-02 NOTE — Discharge Instructions (Signed)

## 2017-08-04 ENCOUNTER — Other Ambulatory Visit: Payer: Medicare Other

## 2017-11-17 ENCOUNTER — Other Ambulatory Visit: Payer: Self-pay | Admitting: Nurse Practitioner

## 2017-11-17 DIAGNOSIS — M545 Low back pain: Principal | ICD-10-CM

## 2017-11-17 DIAGNOSIS — G8929 Other chronic pain: Secondary | ICD-10-CM

## 2017-11-25 ENCOUNTER — Ambulatory Visit
Admission: RE | Admit: 2017-11-25 | Discharge: 2017-11-25 | Disposition: A | Discharge status: Home / Self Care | Payer: Medicare Other | Source: Ambulatory Visit | Attending: Nurse Practitioner | Admitting: Nurse Practitioner

## 2017-11-25 DIAGNOSIS — G8929 Other chronic pain: Secondary | ICD-10-CM

## 2017-11-25 DIAGNOSIS — M545 Low back pain: Principal | ICD-10-CM

## 2017-11-25 MED ORDER — METHYLPREDNISOLONE ACETATE 40 MG/ML INJ SUSP (RADIOLOG
120.0000 mg | Freq: Once | INTRAMUSCULAR | Status: AC
  Administered 2017-11-25: 120 mg via EPIDURAL

## 2017-11-25 MED ORDER — IOPAMIDOL (ISOVUE-M 200) INJECTION 41%
1.0000 mL | Freq: Once | INTRAMUSCULAR | Status: AC
  Administered 2017-11-25: 1 mL via EPIDURAL

## 2017-12-07 ENCOUNTER — Other Ambulatory Visit: Payer: Self-pay | Admitting: Nurse Practitioner

## 2017-12-07 DIAGNOSIS — G8929 Other chronic pain: Secondary | ICD-10-CM

## 2017-12-07 DIAGNOSIS — M545 Low back pain: Principal | ICD-10-CM

## 2017-12-17 ENCOUNTER — Ambulatory Visit
Admission: RE | Admit: 2017-12-17 | Discharge: 2017-12-17 | Disposition: A | Discharge status: Home / Self Care | Payer: Medicare Other | Source: Ambulatory Visit | Attending: Nurse Practitioner | Admitting: Nurse Practitioner

## 2017-12-17 ENCOUNTER — Other Ambulatory Visit: Payer: Self-pay | Admitting: Nurse Practitioner

## 2017-12-17 DIAGNOSIS — G8929 Other chronic pain: Secondary | ICD-10-CM

## 2017-12-17 DIAGNOSIS — M545 Low back pain: Principal | ICD-10-CM

## 2017-12-17 MED ORDER — IOPAMIDOL (ISOVUE-M 200) INJECTION 41%: 1.0000 mL | Freq: Once | INTRAMUSCULAR | Status: DC

## 2017-12-17 MED ORDER — METHYLPREDNISOLONE ACETATE 40 MG/ML INJ SUSP (RADIOLOG: 120.0000 mg | Freq: Once | INTRAMUSCULAR | Status: DC

## 2017-12-17 NOTE — Discharge Instructions (Signed)

## 2018-01-11 DIAGNOSIS — M25559 Pain in unspecified hip: Secondary | ICD-10-CM | POA: Insufficient documentation

## 2018-03-01 ENCOUNTER — Other Ambulatory Visit (HOSPITAL_COMMUNITY): Payer: Self-pay | Admitting: Family Medicine

## 2018-03-01 ENCOUNTER — Other Ambulatory Visit: Payer: Self-pay | Admitting: Family Medicine

## 2018-03-01 ENCOUNTER — Ambulatory Visit (HOSPITAL_COMMUNITY)
Admission: RE | Admit: 2018-03-01 | Discharge: 2018-03-01 | Disposition: A | Discharge status: Home / Self Care | Payer: Medicare Other | Source: Ambulatory Visit | Attending: Family Medicine | Admitting: Family Medicine

## 2018-03-01 DIAGNOSIS — R140 Abdominal distension (gaseous): Secondary | ICD-10-CM

## 2018-03-02 ENCOUNTER — Ambulatory Visit (HOSPITAL_COMMUNITY)
Admission: RE | Admit: 2018-03-02 | Discharge: 2018-03-02 | Disposition: A | Discharge status: Home / Self Care | Payer: Medicare Other | Source: Ambulatory Visit | Attending: Family Medicine | Admitting: Family Medicine

## 2018-03-02 ENCOUNTER — Other Ambulatory Visit (HOSPITAL_COMMUNITY): Payer: Self-pay | Admitting: Family Medicine

## 2018-03-02 DIAGNOSIS — I618 Other nontraumatic intracerebral hemorrhage: Secondary | ICD-10-CM

## 2018-03-02 DIAGNOSIS — I219 Acute myocardial infarction, unspecified: Secondary | ICD-10-CM | POA: Diagnosis not present

## 2018-03-07 ENCOUNTER — Other Ambulatory Visit (HOSPITAL_COMMUNITY)
Admission: RE | Admit: 2018-03-07 | Discharge: 2018-03-07 | Disposition: A | Discharge status: Home / Self Care | Payer: Medicare Other | Source: Ambulatory Visit | Attending: Family Medicine | Admitting: Family Medicine

## 2018-03-07 DIAGNOSIS — R600 Localized edema: Secondary | ICD-10-CM | POA: Insufficient documentation

## 2018-03-07 DIAGNOSIS — E871 Hypo-osmolality and hyponatremia: Secondary | ICD-10-CM | POA: Diagnosis present

## 2018-03-07 DIAGNOSIS — R259 Unspecified abnormal involuntary movements: Secondary | ICD-10-CM | POA: Insufficient documentation

## 2018-03-07 LAB — BASIC METABOLIC PANEL
ANION GAP: 13 (ref 5–15)
BUN: 10 mg/dL (ref 8–23)
CALCIUM: 8.6 mg/dL — AB (ref 8.9–10.3)
CO2: 31 mmol/L (ref 22–32)
Chloride: 78 mmol/L — ABNORMAL LOW (ref 98–111)
Creatinine, Ser: 0.57 mg/dL (ref 0.44–1.00)
GFR calc Af Amer: >60 mL/min (ref >60)
GFR calc non Af Amer: >60 mL/min (ref >60)
GLUCOSE: 114 mg/dL — AB (ref 70–99)
Potassium: 3.1 mmol/L — ABNORMAL LOW (ref 3.5–5.1)
Sodium: 122 mmol/L — ABNORMAL LOW (ref 135–145)

## 2018-03-07 LAB — MAGNESIUM: Magnesium: 1.4 mg/dL — ABNORMAL LOW (ref 1.7–2.4)

## 2018-03-07 LAB — BRAIN NATRIURETIC PEPTIDE: B Natriuretic Peptide: 755 pg/mL — ABNORMAL HIGH (ref 0.0–100.0)

## 2018-03-10 ENCOUNTER — Encounter: Payer: Self-pay | Admitting: Cardiovascular Disease

## 2018-04-02 DIAGNOSIS — M5416 Radiculopathy, lumbar region: Secondary | ICD-10-CM | POA: Insufficient documentation

## 2018-04-07 ENCOUNTER — Ambulatory Visit: Payer: Medicare Other | Admitting: Internal Medicine

## 2018-04-07 ENCOUNTER — Other Ambulatory Visit (HOSPITAL_COMMUNITY)
Admission: RE | Admit: 2018-04-07 | Discharge: 2018-04-07 | Disposition: A | Discharge status: Home / Self Care | Payer: Medicare Other | Source: Ambulatory Visit | Attending: Internal Medicine | Admitting: Internal Medicine

## 2018-04-07 ENCOUNTER — Encounter: Payer: Self-pay | Admitting: Internal Medicine

## 2018-04-07 ENCOUNTER — Ambulatory Visit (HOSPITAL_COMMUNITY)
Admission: RE | Admit: 2018-04-07 | Discharge: 2018-04-07 | Disposition: A | Discharge status: Home / Self Care | Payer: Medicare Other | Source: Ambulatory Visit | Attending: Internal Medicine | Admitting: Internal Medicine

## 2018-04-07 VITALS — BP 128/74 | HR 92 | Ht 66.0 in | Wt 152.8 lb

## 2018-04-07 DIAGNOSIS — R609 Edema, unspecified: Secondary | ICD-10-CM

## 2018-04-07 DIAGNOSIS — R0602 Shortness of breath: Secondary | ICD-10-CM | POA: Insufficient documentation

## 2018-04-07 LAB — CBC WITH DIFFERENTIAL/PLATELET
ABS IMMATURE GRANULOCYTES: 0.02 10*3/uL (ref 0.00–0.07)
Basophils Absolute: 0 10*3/uL (ref 0.0–0.1)
Basophils Relative: 1 %
EOS PCT: 0 %
Eosinophils Absolute: 0 10*3/uL (ref 0.0–0.5)
HEMATOCRIT: 51.8 % — AB (ref 36.0–46.0)
HEMOGLOBIN: 15.2 g/dL — AB (ref 12.0–15.0)
Immature Granulocytes: 0 %
LYMPHS ABS: 1.5 10*3/uL (ref 0.7–4.0)
LYMPHS PCT: 23 %
MCH: 22.6 pg — ABNORMAL LOW (ref 26.0–34.0)
MCHC: 29.3 g/dL — ABNORMAL LOW (ref 30.0–36.0)
MCV: 77 fL — ABNORMAL LOW (ref 80.0–100.0)
MONO ABS: 0.6 10*3/uL (ref 0.1–1.0)
MONOS PCT: 10 %
Neutro Abs: 4.3 10*3/uL (ref 1.7–7.7)
Neutrophils Relative %: 66 %
Platelets: 200 10*3/uL (ref 150–400)
RBC: 6.73 MIL/uL — ABNORMAL HIGH (ref 3.87–5.11)
RDW: 22.5 % — AB (ref 11.5–15.5)
WBC: 6.4 10*3/uL (ref 4.0–10.5)
nRBC: 0 % (ref 0.0–0.2)

## 2018-04-07 LAB — COMPREHENSIVE METABOLIC PANEL
ALT: 18 U/L (ref 0–44)
AST: 23 U/L (ref 15–41)
Albumin: 3.8 g/dL (ref 3.5–5.0)
Alkaline Phosphatase: 84 U/L (ref 38–126)
Anion gap: 11 (ref 5–15)
BUN: 17 mg/dL (ref 8–23)
CO2: 28 mmol/L (ref 22–32)
Calcium: 8.7 mg/dL — ABNORMAL LOW (ref 8.9–10.3)
Chloride: 91 mmol/L — ABNORMAL LOW (ref 98–111)
Creatinine, Ser: 0.82 mg/dL (ref 0.44–1.00)
GFR calc Af Amer: >60 mL/min (ref >60)
GFR calc non Af Amer: >60 mL/min (ref >60)
GLUCOSE: 108 mg/dL — AB (ref 70–99)
POTASSIUM: 3.8 mmol/L (ref 3.5–5.1)
Sodium: 130 mmol/L — ABNORMAL LOW (ref 135–145)
TOTAL PROTEIN: 7.5 g/dL (ref 6.5–8.1)
Total Bilirubin: 1 mg/dL (ref 0.3–1.2)

## 2018-04-07 LAB — TSH: TSH: 0.556 u[IU]/mL (ref 0.350–4.500)

## 2018-04-07 LAB — BRAIN NATRIURETIC PEPTIDE: B Natriuretic Peptide: 994 pg/mL — ABNORMAL HIGH (ref 0.0–100.0)

## 2018-04-07 NOTE — Progress Notes (Signed)
Cardiology Office Note   Date:  04/07/2018   ID:  [name], DOB [birth year], MRN [id number]  PCP:  Lemmie Evens, MD  Cardiologist:   Dorris Carnes, MD   Patient referred for edema     History of Present Illness: [name] is a 73 y.o. female who is followed by Dr [name],   Seen in November 2019  At the time had LE edema up to waist , chest .   Had been developing for a long time   BNP elevated at 915   She was taken off of HCTZ   Started on aldaconte   COntinue Lasix    Swelling improved     Had some numbness in L arm    L arm would go numb  Numbness would occur with and without activity  No CP    Had tightnesss from leg swelling in abdomen and chest   That improved with swelling came down  ]  No tightness now   Breathing is OK   No left arm numbness  Has problemes with back pain  Nerves    Pt smokes about 1/2 ppd to 3/4 ppd     FHx   No FHx of CAD       Current Meds  Medication Sig  . acetaminophen (TYLENOL) 650 MG CR tablet Take 650 mg by mouth every 8 (eight) hours as needed for pain.  . clonazePAM (KLONOPIN) 1 MG tablet Take 1 mg by mouth 2 (two) times daily as needed. For nerves   . hydrochlorothiazide (HYDRODIURIL) 25 MG tablet Take 25 mg by mouth daily as needed. fluid   . HYDROcodone-acetaminophen (NORCO) 10-325 MG tablet   . Iron-Vitamins (GERITOL PO) Take 1 tablet by mouth daily.  . pantoprazole (PROTONIX) 40 MG tablet Take 40 mg by mouth at bedtime.      Allergies:   Chantix [varenicline]; Codeine; and Oxycodone   Past Medical History:  Diagnosis Date  . Arthritis   . Chronic back pain   . GERD (gastroesophageal reflux disease)   . Hypertension   . Thyroid disease   . Ulcer    Billroth I    Past Surgical History:  Procedure Laterality Date  . ABDOMINAL HYSTERECTOMY    . BACK SURGERY     X4  . BILROTH I PROCEDURE    . CHOLECYSTECTOMY    . COLONOSCOPY   05/18/2003   OTR:RNHAFBXUXY colonoscopy/ Internal hemorrhoids.   Otherwise, normal rectum  . COLONOSCOPY  08/12/2010   Dr. Arnoldo Morale: cecum visualized and normal, colon and rectum normal. Torturous colon  . COLONOSCOPY N/A 09/07/2012   RMR: tubular adenoma, lipoma. Due for surveillance in 2021  . ESOPHAGOGASTRODUODENOSCOPY  05/18/2003   RMR:. Normal esophagus/Adenomatous-appearing mucosa at the anastomosis  . ESOPHAGOGASTRODUODENOSCOPY  08/12/2010   Dr. Arnoldo Morale: anastomosis widely patent, no ulcerations, CLO test negative  . STOMACH SURGERY     removed partial stomach  . YAG LASER APPLICATION Left 33/38/3291   Procedure: YAG LASER APPLICATION;  Surgeon: [name], MD;  Location: [facility];  Service: Ophthalmology;  Laterality: Left;  . YAG LASER APPLICATION Right 91/66/0600   Procedure: YAG LASER APPLICATION;  Surgeon: [name], MD;  Location: [facility];  Service: Ophthalmology;  Laterality: Right;     Social History:  The patient  reports that she has been smoking cigarettes. She has a 11.25 pack-year smoking history. She has never used smokeless tobacco. She reports that she does not drink alcohol or use drugs.  Family History:  The patient's family history includes Cancer in her brother, maternal aunt, and sister.    ROS:  Please see the history of present illness. All other systems are reviewed and  Negative to the above problem except as noted.    PHYSICAL EXAM: VS:  BP 128/74   Pulse 92   Ht _0  (1.676 m)   Wt 152 lb 12.8 oz (69.3 kg)   SpO2 92%   BMI 24.66 kg/m   GEN: Well nourished, well developed, in no acute distress  HEENT: normal  Neck: no JVD, carotid bruits, or masses Cardiac: RRR; no murmurs, rubs, or gallops,1+ edema  Respiratory:  Rhonchi bilaterally  GI: soft, nontender, nondistended, + BS  No hepatomegaly  MS: no deformity Moving all extremities   Skin: warm and dry, no rash Neuro:  Strength and sensation are intact Psych: euthymic mood, full affect   EKG:  EKG is ordered today.  SR 94 bpm    Lipid Panel      Component Value Date/Time   CHOL (H) 01/13/2007 1006    245        ATP III CLASSIFICATION:  <200     mg/dL   Desirable  200-239  mg/dL   Borderline High  >=240    mg/dL   High   TRIG 117 01/13/2007 1006      Wt Readings from Last 3 Encounters:  04/07/18 152 lb 12.8 oz (69.3 kg)  11/01/12 138 lb 3.2 oz (62.7 kg)  10/10/12 135 lb 12.8 oz (61.6 kg)      ASSESSMENT AND PLAN:  PT is a 73 yo with swelling that improved with increased diuretic Rx  Volume is still up on exam.   It sounds like this was related to chest tightness  1.  Edema   WIll set up for echo to eval LV funciton   I would als orecomm checking labs to eval response to meds  2  Chest tightness   Improved  3  Pulmonary   Pt with extensive rhonchi   Will set up for CXR     Current medicines are reviewed at length with the patient today.  The patient does not have concerns regarding medicines.  Signed, Dorris Carnes, MD  04/07/2018 9:47 AM    [facility] [address], [place], [place]  [address] Phone: [phone]; Fax: [phone]

## 2018-04-07 NOTE — H&P (View-Only) (Signed)
 Cardiology Office Note   Date:  04/07/2018   ID:  [name], DOB 03/16/1946, MRN [id number]  PCP:  [name], Steve, MD  Cardiologist:   Molly Primo, MD   Patient referred for edema     History of Present Illness: [name] is a 73 y.o. female who is followed by Dr [name],   Seen in November 2019  At the time had LE edema up to waist , chest .   Had been developing for a long time   BNP elevated at 915   She was taken off of HCTZ   Started on aldaconte   COntinue Lasix    Swelling improved     Had some numbness in L arm    L arm would go numb  Numbness would occur with and without activity  No CP    Had tightnesss from leg swelling in abdomen and chest   That improved with swelling came down  ]  No tightness now   Breathing is OK   No left arm numbness  Has problemes with back pain  Nerves    Pt smokes about 1/2 ppd to 3/4 ppd     FHx   No FHx of CAD       Current Meds  Medication Sig  . acetaminophen (TYLENOL) 650 MG CR tablet Take 650 mg by mouth every 8 (eight) hours as needed for pain.  . clonazePAM (KLONOPIN) 1 MG tablet Take 1 mg by mouth 2 (two) times daily as needed. For nerves   . hydrochlorothiazide (HYDRODIURIL) 25 MG tablet Take 25 mg by mouth daily as needed. fluid   . HYDROcodone-acetaminophen (NORCO) 10-325 MG tablet   . Iron-Vitamins (GERITOL PO) Take 1 tablet by mouth daily.  . pantoprazole (PROTONIX) 40 MG tablet Take 40 mg by mouth at bedtime.      Allergies:   Chantix [varenicline]; Codeine; and Oxycodone   Past Medical History:  Diagnosis Date  . Arthritis   . Chronic back pain   . GERD (gastroesophageal reflux disease)   . Hypertension   . Thyroid disease   . Ulcer    Billroth I    Past Surgical History:  Procedure Laterality Date  . ABDOMINAL HYSTERECTOMY    . BACK SURGERY     X4  . BILROTH I PROCEDURE    . CHOLECYSTECTOMY    . COLONOSCOPY   05/18/2003   RMR:Incomplete colonoscopy/ Internal hemorrhoids.   Otherwise, normal rectum  . COLONOSCOPY  08/12/2010   Dr. Jenkins: cecum visualized and normal, colon and rectum normal. Torturous colon  . COLONOSCOPY N/A 09/07/2012   RMR: tubular adenoma, lipoma. Due for surveillance in 2021  . ESOPHAGOGASTRODUODENOSCOPY  05/18/2003   RMR:. Normal esophagus/Adenomatous-appearing mucosa at the anastomosis  . ESOPHAGOGASTRODUODENOSCOPY  08/12/2010   Dr. Jenkins: anastomosis widely patent, no ulcerations, CLO test negative  . STOMACH SURGERY     removed partial stomach  . YAG LASER APPLICATION Left 01/14/2016   Procedure: YAG LASER APPLICATION;  Surgeon: Molly Shapiro, MD;  Location: [facility];  Service: Ophthalmology;  Laterality: Left;  . YAG LASER APPLICATION Right 01/28/2016   Procedure: YAG LASER APPLICATION;  Surgeon: Molly Shapiro, MD;  Location: [facility];  Service: Ophthalmology;  Laterality: Right;     Social History:  The patient  reports that she has been smoking cigarettes. She has a 11.25 pack-year smoking history. She has never used smokeless tobacco. She reports that she does not drink alcohol or use drugs.     Family History:  The patient's family history includes Cancer in her brother, maternal aunt, and sister.    ROS:  Please see the history of present illness. All other systems are reviewed and  Negative to the above problem except as noted.    PHYSICAL EXAM: VS:  BP 128/74   Pulse 92   Ht 5' 6" (1.676 m)   Wt 152 lb 12.8 oz (69.3 kg)   SpO2 92%   BMI 24.66 kg/m   GEN: Well nourished, well developed, in no acute distress  HEENT: normal  Neck: no JVD, carotid bruits, or masses Cardiac: RRR; no murmurs, rubs, or gallops,1+ edema  Respiratory:  Rhonchi bilaterally  GI: soft, nontender, nondistended, + BS  No hepatomegaly  MS: no deformity Moving all extremities   Skin: warm and dry, no rash Neuro:  Strength and sensation are intact Psych: euthymic mood, full affect   EKG:  EKG is ordered today.  SR 94 bpm    Lipid Panel      Component Value Date/Time   CHOL (H) 01/13/2007 1006    245        ATP III CLASSIFICATION:  <200     mg/dL   Desirable  200-239  mg/dL   Borderline High  >=240    mg/dL   High   TRIG 117 01/13/2007 1006      Wt Readings from Last 3 Encounters:  04/07/18 152 lb 12.8 oz (69.3 kg)  11/01/12 138 lb 3.2 oz (62.7 kg)  10/10/12 135 lb 12.8 oz (61.6 kg)      ASSESSMENT AND PLAN:  PT is a 73 yo with swelling that improved with increased diuretic Rx  Volume is still up on exam.   It sounds like this was related to chest tightness  1.  Edema   WIll set up for echo to eval LV funciton   I would als orecomm checking labs to eval response to meds  2  Chest tightness   Improved  3  Pulmonary   Pt with extensive rhonchi   Will set up for CXR     Current medicines are reviewed at length with the patient today.  The patient does not have concerns regarding medicines.  Signed, Molly Mulford, MD  04/07/2018 9:47 AM    [facility] [address], [place], [place]  [address] Phone: [phone]; Fax: [phone]    

## 2018-04-07 NOTE — Patient Instructions (Signed)
Medication Instructions:  Your physician recommends that you continue on your current medications as directed. Please refer to the Current Medication list given to you today.  If you need a refill on your cardiac medications before your next appointment, please call your pharmacy.   Lab work: Your physician recommends that you return for lab work in: Today   If you have labs (blood work) drawn today and your tests are completely normal, you will receive your results only by: Marland Kitchen MyChart Message (if you have MyChart) OR . A paper copy in the mail If you have any lab test that is abnormal or we need to change your treatment, we will call you to review the results.  Testing/Procedures: NONE   Follow-Up: At [facility], you and your health needs are our priority.  As part of our continuing mission to provide you with exceptional heart care, we have created designated Provider Care Teams.  These Care Teams include your primary Cardiologist (physician) and Advanced Practice Providers (APPs -  Physician Assistants and Nurse Practitioners) who all work together to provide you with the care you need, when you need it. You will need a follow up appointment to be determined.  Please call our office 2 months in advance to schedule this appointment.  You may see Dorris Carnes, MD or one of the following Advanced Practice Providers on your designated Care Team:   Bernerd Pho, PA-C [facility]) . Ermalinda Barrios, PA-C ([facility])  Any Other Special Instructions Will Be Listed Below (If Applicable). Thank you for choosing [facility]!

## 2018-04-08 ENCOUNTER — Ambulatory Visit (HOSPITAL_COMMUNITY)
Admission: RE | Admit: 2018-04-08 | Discharge: 2018-04-08 | Disposition: A | Discharge status: Home / Self Care | Payer: Medicare Other | Source: Ambulatory Visit | Attending: Internal Medicine | Admitting: Internal Medicine

## 2018-04-08 DIAGNOSIS — R609 Edema, unspecified: Secondary | ICD-10-CM

## 2018-04-08 DIAGNOSIS — R0602 Shortness of breath: Secondary | ICD-10-CM | POA: Diagnosis not present

## 2018-04-08 NOTE — Progress Notes (Signed)
*  PRELIMINARY RESULTS* Echocardiogram 2D Echocardiogram has been performed.  Leavy Cella 04/08/2018, 12:23 PM

## 2018-04-12 ENCOUNTER — Telehealth: Payer: Self-pay | Admitting: *Deleted

## 2018-04-12 ENCOUNTER — Telehealth: Payer: Self-pay | Admitting: Internal Medicine

## 2018-04-12 DIAGNOSIS — R0602 Shortness of breath: Secondary | ICD-10-CM

## 2018-04-12 NOTE — Telephone Encounter (Signed)
-----  Message from Bernita Raisin, RN sent at 04/12/2018  1:32 PM EST -----  ----- Message ----- From: Fay Records, MD Sent: 04/12/2018   1:01 PM EST To: Bernita Raisin, RN  Tried to call pt to review test Left short message on machine Echocardiogram shows Right ventricle (right sided pumping chamber) is dilated severely and function is down   May explain tendencies for swelling Question is why Left ventricle that pumps blood to body is working OK    Need to know why R side is down    REcomm   To start at CT angiogram of chest to rule out pulmonary emboli Keep on same meds for now

## 2018-04-12 NOTE — Telephone Encounter (Signed)
Pt notified and order placed

## 2018-04-12 NOTE — Telephone Encounter (Signed)
Would like results from Echo

## 2018-04-12 NOTE — Telephone Encounter (Signed)
Pt made aware

## 2018-04-25 ENCOUNTER — Telehealth: Payer: Self-pay | Admitting: Internal Medicine

## 2018-04-25 NOTE — Telephone Encounter (Signed)
Pt needs CT angiogram of the chest to r/o pulmonary embolus

## 2018-04-25 NOTE — Telephone Encounter (Signed)
Pt has apt set in 2 days, 04/27/2018

## 2018-04-27 ENCOUNTER — Ambulatory Visit (HOSPITAL_COMMUNITY): Payer: Medicare Other

## 2018-04-27 ENCOUNTER — Ambulatory Visit (HOSPITAL_COMMUNITY)
Admission: RE | Admit: 2018-04-27 | Discharge: 2018-04-27 | Disposition: A | Discharge status: Home / Self Care | Payer: Medicare Other | Source: Ambulatory Visit | Attending: Internal Medicine | Admitting: Internal Medicine

## 2018-04-27 ENCOUNTER — Other Ambulatory Visit: Payer: Self-pay | Admitting: Cardiology

## 2018-04-27 DIAGNOSIS — R0602 Shortness of breath: Secondary | ICD-10-CM | POA: Diagnosis present

## 2018-04-27 MED ORDER — IOPAMIDOL (ISOVUE-370) INJECTION 76%
100.0000 mL | Freq: Once | INTRAVENOUS | Status: AC | PRN
  Administered 2018-04-27: 100 mL via INTRAVENOUS

## 2018-05-02 ENCOUNTER — Ambulatory Visit (HOSPITAL_COMMUNITY)
Admission: RE | Admit: 2018-05-02 | Discharge: 2018-05-02 | Disposition: A | Discharge status: Home / Self Care | Payer: Medicare Other | Source: Ambulatory Visit | Attending: Cardiology | Admitting: Cardiology

## 2018-05-02 DIAGNOSIS — Z01818 Encounter for other preprocedural examination: Secondary | ICD-10-CM

## 2018-05-02 DIAGNOSIS — Z01812 Encounter for preprocedural laboratory examination: Secondary | ICD-10-CM | POA: Diagnosis not present

## 2018-05-02 LAB — BASIC METABOLIC PANEL
Anion gap: 11 (ref 5–15)
BUN: 16 mg/dL (ref 8–23)
CO2: 25 mmol/L (ref 22–32)
Calcium: 8.6 mg/dL — ABNORMAL LOW (ref 8.9–10.3)
Chloride: 91 mmol/L — ABNORMAL LOW (ref 98–111)
Creatinine, Ser: 0.8 mg/dL (ref 0.44–1.00)
GFR calc Af Amer: >60 mL/min (ref >60)
GFR calc non Af Amer: >60 mL/min (ref >60)
Glucose, Bld: 96 mg/dL (ref 70–99)
Potassium: 4 mmol/L (ref 3.5–5.1)
SODIUM: 127 mmol/L — AB (ref 135–145)

## 2018-05-02 LAB — CBC
HCT: 50.6 % — ABNORMAL HIGH (ref 36.0–46.0)
Hemoglobin: 14.8 g/dL (ref 12.0–15.0)
MCH: 22.5 pg — ABNORMAL LOW (ref 26.0–34.0)
MCHC: 29.2 g/dL — ABNORMAL LOW (ref 30.0–36.0)
MCV: 76.9 fL — ABNORMAL LOW (ref 80.0–100.0)
Platelets: 233 10*3/uL (ref 150–400)
RBC: 6.58 MIL/uL — ABNORMAL HIGH (ref 3.87–5.11)
RDW: 23.1 % — ABNORMAL HIGH (ref 11.5–15.5)
WBC: 5.1 10*3/uL (ref 4.0–10.5)
nRBC: 0 % (ref 0.0–0.2)

## 2018-05-03 ENCOUNTER — Ambulatory Visit: Payer: Medicare Other | Admitting: Physician Assistant

## 2018-05-04 ENCOUNTER — Telehealth (HOSPITAL_COMMUNITY): Payer: Self-pay | Admitting: Vascular Surgery

## 2018-05-04 ENCOUNTER — Other Ambulatory Visit (HOSPITAL_COMMUNITY): Payer: Self-pay | Admitting: *Deleted

## 2018-05-04 ENCOUNTER — Other Ambulatory Visit: Payer: Self-pay

## 2018-05-04 ENCOUNTER — Encounter (HOSPITAL_COMMUNITY)
Admission: RE | Disposition: A | Discharge status: Home / Self Care | Payer: Self-pay | Source: Home / Self Care | Attending: Cardiology

## 2018-05-04 ENCOUNTER — Ambulatory Visit (HOSPITAL_COMMUNITY)
Admission: RE | Admit: 2018-05-04 | Discharge: 2018-05-04 | Disposition: A | Discharge status: Home / Self Care | Payer: Medicare Other | Attending: Cardiology | Admitting: Cardiology

## 2018-05-04 DIAGNOSIS — I10 Essential (primary) hypertension: Secondary | ICD-10-CM | POA: Diagnosis not present

## 2018-05-04 DIAGNOSIS — R0789 Other chest pain: Secondary | ICD-10-CM | POA: Diagnosis not present

## 2018-05-04 DIAGNOSIS — R200 Anesthesia of skin: Secondary | ICD-10-CM | POA: Insufficient documentation

## 2018-05-04 DIAGNOSIS — F17210 Nicotine dependence, cigarettes, uncomplicated: Secondary | ICD-10-CM | POA: Insufficient documentation

## 2018-05-04 DIAGNOSIS — E079 Disorder of thyroid, unspecified: Secondary | ICD-10-CM | POA: Diagnosis not present

## 2018-05-04 DIAGNOSIS — M1990 Unspecified osteoarthritis, unspecified site: Secondary | ICD-10-CM | POA: Diagnosis not present

## 2018-05-04 DIAGNOSIS — Z90710 Acquired absence of both cervix and uterus: Secondary | ICD-10-CM | POA: Diagnosis not present

## 2018-05-04 DIAGNOSIS — I2721 Secondary pulmonary arterial hypertension: Secondary | ICD-10-CM | POA: Diagnosis present

## 2018-05-04 DIAGNOSIS — I272 Pulmonary hypertension, unspecified: Secondary | ICD-10-CM

## 2018-05-04 DIAGNOSIS — Z885 Allergy status to narcotic agent status: Secondary | ICD-10-CM | POA: Diagnosis not present

## 2018-05-04 DIAGNOSIS — K219 Gastro-esophageal reflux disease without esophagitis: Secondary | ICD-10-CM | POA: Insufficient documentation

## 2018-05-04 DIAGNOSIS — Z79899 Other long term (current) drug therapy: Secondary | ICD-10-CM | POA: Insufficient documentation

## 2018-05-04 DIAGNOSIS — R609 Edema, unspecified: Secondary | ICD-10-CM | POA: Diagnosis not present

## 2018-05-04 HISTORY — PX: RIGHT HEART CATH: CATH118263

## 2018-05-04 LAB — POCT I-STAT EG7
Acid-Base Excess: 4 mmol/L — ABNORMAL HIGH (ref 0.0–2.0)
Acid-Base Excess: 2 mmol/L (ref 0.0–2.0)
Bicarbonate: 32.2 mmol/L — ABNORMAL HIGH (ref 20.0–28.0)
Bicarbonate: 30.3 mmol/L — ABNORMAL HIGH (ref 20.0–28.0)
Calcium, Ion: 1.21 mmol/L (ref 1.15–1.40)
Calcium, Ion: 1.06 mmol/L — ABNORMAL LOW (ref 1.15–1.40)
HCT: 50 % — ABNORMAL HIGH (ref 36.0–46.0)
HCT: 49 % — ABNORMAL HIGH (ref 36.0–46.0)
HEMOGLOBIN: 17 g/dL — AB (ref 12.0–15.0)
Hemoglobin: 16.7 g/dL — ABNORMAL HIGH (ref 12.0–15.0)
O2 Saturation: 51 %
O2 Saturation: 53 %
POTASSIUM: 4.1 mmol/L (ref 3.5–5.1)
Potassium: 3.7 mmol/L (ref 3.5–5.1)
Sodium: 134 mmol/L — ABNORMAL LOW (ref 135–145)
Sodium: 136 mmol/L (ref 135–145)
TCO2: 34 mmol/L — ABNORMAL HIGH (ref 22–32)
TCO2: 32 mmol/L (ref 22–32)
pCO2, Ven: 61.7 mmHg — ABNORMAL HIGH (ref 44.0–60.0)
pCO2, Ven: 59.5 mmHg (ref 44.0–60.0)
pH, Ven: 7.325 (ref 7.250–7.430)
pH, Ven: 7.315 (ref 7.250–7.430)
pO2, Ven: 30 mmHg — CL (ref 32.0–45.0)
pO2, Ven: 31 mmHg — CL (ref 32.0–45.0)

## 2018-05-04 SURGERY — RIGHT HEART CATH
Anesthesia: LOCAL

## 2018-05-04 MED ORDER — SODIUM CHLORIDE 0.9 % IV SOLN: 250.0000 mL | INTRAVENOUS | Status: DC | PRN

## 2018-05-04 MED ORDER — MIDAZOLAM HCL 2 MG/2ML IJ SOLN
INTRAMUSCULAR | Status: AC
  Filled 2018-05-04: qty 2

## 2018-05-04 MED ORDER — SODIUM CHLORIDE 0.9 % IV SOLN: INTRAVENOUS | Status: DC

## 2018-05-04 MED ORDER — LIDOCAINE HCL (PF) 1 % IJ SOLN
INTRAMUSCULAR | Status: AC
  Filled 2018-05-04: qty 30

## 2018-05-04 MED ORDER — LIDOCAINE HCL (PF) 1 % IJ SOLN
INTRAMUSCULAR | Status: DC | PRN
  Administered 2018-05-04: 2 mL via INTRADERMAL

## 2018-05-04 MED ORDER — SODIUM CHLORIDE 0.9% FLUSH: 3.0000 mL | Freq: Two times a day (BID) | INTRAVENOUS | Status: DC

## 2018-05-04 MED ORDER — SODIUM CHLORIDE 0.9% FLUSH: 3.0000 mL | INTRAVENOUS | Status: DC | PRN

## 2018-05-04 MED ORDER — HEPARIN (PORCINE) IN NACL 1000-0.9 UT/500ML-% IV SOLN
INTRAVENOUS | Status: AC
  Filled 2018-05-04: qty 1000

## 2018-05-04 MED ORDER — ACETAMINOPHEN 325 MG PO TABS: 650.0000 mg | ORAL_TABLET | ORAL | Status: DC | PRN

## 2018-05-04 MED ORDER — ONDANSETRON HCL 4 MG/2ML IJ SOLN: 4.0000 mg | Freq: Four times a day (QID) | INTRAMUSCULAR | Status: DC | PRN

## 2018-05-04 MED ORDER — FENTANYL CITRATE (PF) 100 MCG/2ML IJ SOLN
INTRAMUSCULAR | Status: AC
  Filled 2018-05-04: qty 2

## 2018-05-04 MED ORDER — FENTANYL CITRATE (PF) 100 MCG/2ML IJ SOLN
INTRAMUSCULAR | Status: DC | PRN
  Administered 2018-05-04: 25 ug via INTRAVENOUS

## 2018-05-04 MED ORDER — MIDAZOLAM HCL 2 MG/2ML IJ SOLN
INTRAMUSCULAR | Status: DC | PRN
  Administered 2018-05-04: 1 mg via INTRAVENOUS

## 2018-05-04 SURGICAL SUPPLY — 7 items
CATH BALLN WEDGE 5F 110CM (CATHETERS) ×1 IMPLANT
KIT PV (KITS) ×1 IMPLANT
PACK CARDIAC CATHETERIZATION (CUSTOM PROCEDURE TRAY) ×2 IMPLANT
SHEATH GLIDE SLENDER 4/5FR (SHEATH) ×1 IMPLANT
SHEATH PINNACLE 4F 10CM (SHEATH) IMPLANT
TRANSDUCER W/STOPCOCK (MISCELLANEOUS) ×2 IMPLANT
TUBING CIL FLEX 10 FLL-RA (TUBING) ×2 IMPLANT

## 2018-05-04 NOTE — Interval H&P Note (Signed)
History and Physical Interval Note:  05/04/2018 11:41 AM  [name]  has presented today for surgery, with the diagnosis of CHF  The various methods of treatment have been discussed with the patient and family. After consideration of risks, benefits and other options for treatment, the patient has consented to  Procedure(s): RIGHT HEART CATH (N/A) as a surgical intervention .  The patient's history has been reviewed, patient examined, no change in status, stable for surgery.  I have reviewed the patient's chart and labs.  Questions were answered to the patient's satisfaction.     Adali Pennings [employer]

## 2018-05-04 NOTE — Discharge Instructions (Signed)
Brachial Site Care  This sheet gives you information about how to care for yourself after your procedure. Your health care provider may also give you more specific instructions. If you have problems or questions, contact your health care provider. What can I expect after the procedure? After the procedure, it is common to have:  Bruising and tenderness at the catheter insertion area. Follow these instructions at home: Medicines  Take over-the-counter and prescription medicines only as told by your health care provider. Insertion site care  Follow instructions from your health care provider about how to take care of your insertion site. Make sure you: ? Wash your hands with soap and water before you change your bandage (dressing). If soap and water are not available, use hand sanitizer. ? Remove your dressing as told by your health care provider. In 24 hours  Check your insertion site every day for signs of infection. Check for: ? Redness, swelling, or pain. ? Fluid or blood. ? Pus or a bad smell. ? Warmth.  Do not take baths, swim, or use a hot tub until your health care provider approves.  You may shower 24-48 hours after the procedure, or as directed by your health care provider. ? Remove the dressing and gently wash the site with plain soap and water. ? Pat the area dry with a clean towel. ? Do not rub the site. That could cause bleeding.  Do not apply powder or lotion to the site. Activity   For 24 hours after the procedure, or as directed by your health care provider: ? Do not push or pull heavy objects with the affected arm. ? Do not drive yourself home from the hospital or clinic. You may drive 24 hours after the procedure unless your health care provider tells you not to. ? Do not operate machinery or power tools.  Do not lift anything that is heavier than 10 lb (4.5 kg), or the limit that you are told, until your health care provider says that it is safe. For 3  days  Ask your health care provider when it is okay to: ? Return to work or school. ? Resume usual physical activities or sports. ? Resume sexual activity. General instructions  If the catheter site starts to bleed, raise your arm and put firm pressure on the site.  If you went home on the same day as your procedure, a responsible adult should be with you for the first 24 hours after you arrive home.  Keep all follow-up visits as told by your health care provider. This is important. Contact a health care provider if:  You have a fever.  You have redness, swelling, or yellow drainage around your insertion site. Get help right away if:  You have unusual pain at the radial site.  The catheter insertion area swells very fast. These symptoms may represent a serious problem that is an emergency. Do not wait to see if the symptoms will go away. Get medical help right away. Call your local emergency services (911 in the [place] Do not drive yourself to the hospital. Summary  After the procedure, it is common to have bruising and tenderness at the site.  Follow instructions from your health care provider about how to take care of your radial site wound. Check the wound every day for signs of infection.  Do not lift anything that is heavier than 10 lb (4.5 kg), or the limit that you are told, until your health care provider says that  it is safe. This information is not intended to replace advice given to you by your health care provider. Make sure you discuss any questions you have with your health care provider. Document Released: 04/25/2010 Document Revised: 04/28/2017 Document Reviewed: 04/28/2017 Elsevier Interactive Patient Education  2019 [employer].

## 2018-05-04 NOTE — Consult Note (Signed)
Upon arrival to room, RN states the MD will do the PIVs

## 2018-05-04 NOTE — Telephone Encounter (Signed)
Left pt message giving VQ scan  APPT AND pft appt 05/12/18, VQ SCAN 10:30 /pft 1:00 PM asked pt to call back to confirm appt

## 2018-05-05 ENCOUNTER — Encounter (HOSPITAL_COMMUNITY): Payer: Self-pay | Admitting: Cardiology

## 2018-05-05 MED FILL — Heparin Sod (Porcine)-NaCl IV Soln 1000 Unit/500ML-0.9%: INTRAVENOUS | Qty: 1000 | Status: AC

## 2018-05-11 ENCOUNTER — Encounter: Payer: Self-pay | Admitting: Physician Assistant

## 2018-05-11 ENCOUNTER — Ambulatory Visit: Payer: Medicare Other | Admitting: Physician Assistant

## 2018-05-11 VITALS — BP 130/82 | HR 86 | Ht 66.0 in | Wt 153.8 lb

## 2018-05-11 DIAGNOSIS — I272 Pulmonary hypertension, unspecified: Secondary | ICD-10-CM | POA: Diagnosis not present

## 2018-05-11 DIAGNOSIS — Z720 Tobacco use: Secondary | ICD-10-CM

## 2018-05-11 NOTE — Progress Notes (Signed)
Cardiology Office Note:    Date:  05/11/2018   ID:  [name], DOB 06-05-1945, MRN [id number]  PCP:  Lemmie Evens, MD  Cardiologist:  Dorris Carnes, MD   Electrophysiologist:  None   Referring MD: Lemmie Evens, MD   Chief Complaint  Patient presents with  . Follow-up    s/p R heart cath     History of Present Illness:    [name] is a 73 y.o. female with hypertension, pulmonary hypertension, R sided HF, tobacco use.  she was evaluated by Dr. Harrington Challenger last month for lower extremity swelling.  An Echo showed PASP 76 and severely reduced RVSF.  CT was neg for pulmonary embolism.  She was set up with a R HC with Dr. Aundra Dubin which demonstrated low L heart filling pressure, low CO, mod pulmonary HTN.  VQ scan is now pending to rule out CTEPH.  PFTs are also pending.  She has follow up with Dr. Aundra Dubin in the [facility] later this month.     [name] returns for follow up. She is here with her son.  Initially, it was not clear why this appointment was made.  But, she did have several questions, so we discussed her current workup and potential dx.  She has not had any shortness of breath, chest pain, syncope, paroxysmal nocturnal dyspnea.  Her lower extremity swelling is stable.  She continues to smoke.    Prior CV studies:   The following studies were reviewed today:  R Cardiac Catheterization 05/04/2018 Right Heart Pressures RHC Procedural Findings: Hemodynamics (mmHg) RA mean 10 RV 54/8 PA 55/21, mean 36 PCWP mean 7  Oxygen saturations: PA 53% AO 95%  Cardiac Output (Fick) 2.71  Cardiac Index (Fick) 1.57 PVR 10 WU   1. Low left heart filling pressure.  2. Low cardiac output.  3. Moderate pulmonary arterial hypertension with PVR 10 WU.   She needs workup and possible treatment of PAH.  Will get V/Q scan to fully rule out chronic PE as well as PFTs.  She will need followup with me, will get autoimmune serologies at that time.  Unless PFTs look like  severe COPD, will likely start selective pulmonary vasodilators.    Chest CTA 04/27/2018 IMPRESSION: 1. No demonstrable pulmonary embolus. No thoracic aortic aneurysm or dissection. There is aortic atherosclerosis as well as foci of coronary artery and great vessel calcification.  2. No lung edema or consolidation. 3 mm nodular opacities in the upper lobes, one of which was not present on the 2014 study. No follow-up needed if patient is low-risk (and has no known or suspected primary neoplasm). Non-contrast chest CT can be considered in 12 months if patient is high-risk. This recommendation follows the consensus statement: Guidelines for Management of Incidental Pulmonary Nodules Detected on CT Images: From the Fleischner Society 2017; Radiology 2017; 284:228-243.  3. Single mildly prominent subcarinal lymph node. Etiology for this lymph node prominence is uncertain.  4. Gallbladder absent. Prominence of the common bile duct. From an imaging standpoint, MRCP would be the imaging study of choice to further assess the biliary ducts. Note that there was biliary duct dilatation on the 2014 CT examination.  5. There is a degree of reflux into the hepatic veins. This is a finding that potentially may indicate a degree of increase in right heart pressure.  Aortic Atherosclerosis (ICD10-I70.0).   Echo 04/08/2018 Septal flattening c/w RV volume/pressure overload, mild LVH, EF 60-65, severe RVE, severely reduced RVSF, severe RAE, PASP  1  Past Medical History:  Diagnosis Date  . Arthritis   . Chronic back pain   . GERD (gastroesophageal reflux disease)   . Hypertension   . Thyroid disease   . Ulcer    [place]th I   Surgical Hx: The patient  has a past surgical history that includes Back surgery; Cholecystectomy; Abdominal hysterectomy; Colonoscopy ( 05/18/2003); Esophagogastroduodenoscopy (05/18/2003); Colonoscopy (08/12/2010); Esophagogastroduodenoscopy (08/12/2010); Bilroth I  procedure; Colonoscopy (N/A, 09/07/2012); Stomach surgery; Yag laser application (Left, 63/87/5643); Yag laser application (Right, 32/95/1884); and RIGHT HEART CATH (N/A, 05/04/2018).   Current Medications: Current Meds  Medication Sig  . clonazePAM (KLONOPIN) 1 MG tablet Take 1 mg by mouth 4 (four) times daily as needed (For nerves).   . furosemide (LASIX) 40 MG tablet Take 40 mg by mouth daily as needed for fluid.  Marland Kitchen gabapentin (NEURONTIN) 300 MG capsule Take 300 mg by mouth 3 (three) times daily.  Marland Kitchen HYDROcodone-acetaminophen (NORCO) 10-325 MG tablet Take 1 tablet by mouth every 4 (four) hours as needed (pain).   Marland Kitchen ibuprofen (ADVIL,MOTRIN) 200 MG tablet Take 400 mg by mouth 2 (two) times daily as needed (pain).  Marland Kitchen MAGNESIUM PO Take 1 tablet by mouth daily.  . pantoprazole (PROTONIX) 40 MG tablet Take 40 mg by mouth at bedtime.   Marland Kitchen spironolactone (ALDACTONE) 25 MG tablet Take 25 mg by mouth daily.     Allergies:   Chantix [varenicline]; Codeine; and Oxycodone   Social History   Tobacco Use  . Smoking status: Current Every Day Smoker    Packs/day: 0.75    Years: 15.00    Pack years: 11.25    Types: Cigarettes  . Smokeless tobacco: Never Used  Substance Use Topics  . Alcohol use: No  . Drug use: No     Family Hx: The patient's family history includes Cancer in her brother, maternal aunt, and sister. There is no history of Colon cancer.  ROS:   Please see the history of present illness.    ROS All other systems reviewed and are negative.   EKGs/Labs/Other Test Reviewed:    EKG:  EKG is not ordered today.    Recent Labs: 03/07/2018: Magnesium 1.4 04/07/2018: ALT 18; B Natriuretic Peptide 994.0; TSH 0.556 05/02/2018: BUN 16; Creatinine, Ser 0.80; Platelets 233 05/04/2018: Hemoglobin 17.0; Hemoglobin 16.7; Potassium 4.1; Potassium 3.7; Sodium 134; Sodium 136   Recent Lipid Panel   Physical Exam:    VS:  BP 130/82   Pulse 86   Ht _0  (1.676 m)   Wt 153 lb 12.8 oz (69.8  kg)   SpO2 92%   BMI 24.82 kg/m     Wt Readings from Last 3 Encounters:  05/11/18 153 lb 12.8 oz (69.8 kg)  05/04/18 140 lb (63.5 kg)  04/07/18 152 lb 12.8 oz (69.3 kg)     Physical Exam  Constitutional: She is oriented to person, place, and time. She appears well-developed and well-nourished. No distress.  HENT:  Head: Normocephalic and atraumatic.  Neck: No thyromegaly present.  Pulmonary/Chest: She has no wheezes. She has no rales.  Abdominal: Soft.  Musculoskeletal:        General: Edema (1+ bilat ankle edema (L>R)) present.     Comments: R antecubital fossa cath site well healed  Neurological: She is alert and oriented to person, place, and time.  Skin: Skin is warm and dry.    ASSESSMENT & PLAN:    Pulmonary hypertension, unspecified ([place]) Work up is ongoing.  VQ scan and  PFTs are pending and she sees Dr. Aundra Dubin next week.  She had a lot of questions about R sided HF and pulmonary hypertension as well as her upcoming testing.  I tried to answer all of her questions satisfactorily.  She will follow up with Dr. Loralie Champagne as planned.     Tobacco abuse   She has tried to quit several times.  She had side effects to Chantix and nicotine patches.  We discussed cutting back and setting a quit date as well as looking into hypnosis or acupuncture.    Dispo:  Return in 8 days (on 05/19/2018) for Scheduled Follow Up, w/ Dr. Aundra Dubin in [facility].   Medication Adjustments/Labs and Tests Ordered: Current medicines are reviewed at length with the patient today.  Concerns regarding medicines are outlined above.  Tests Ordered: No orders of the defined types were placed in this encounter.  Medication Changes: No orders of the defined types were placed in this encounter.   Signed, Richardson Dopp, PA-C  05/11/2018 2:15 PM    [facility] [address], [place], [place]  [address] Phone: [phone]; Fax: [phone]

## 2018-05-11 NOTE — Patient Instructions (Signed)
Medication Instructions:  Your physician recommends that you continue on your current medications as directed. Please refer to the Current Medication list given to you today.   If you need a refill on your cardiac medications before your next appointment, please call your pharmacy.   Lab work: NONE  If you have labs (blood work) drawn today and your tests are completely normal, you will receive your results only by: Marland Kitchen MyChart Message (if you have MyChart) OR . A paper copy in the mail If you have any lab test that is abnormal or we need to change your treatment, we will call you to review the results.  Testing/Procedures: NONE  Follow-Up: At [facility], you and your health needs are our priority.  As part of our continuing mission to provide you with exceptional heart care, we have created designated Provider Care Teams.  These Care Teams include your primary Cardiologist (physician) and Advanced Practice Providers (APPs -  Physician Assistants and Nurse Practitioners) who all work together to provide you with the care you need, when you need it. You will need a follow up WITH DR. Aundra Dubin AS SCHEDULED.  Any Other Special Instructions Will Be Listed Below (If Applicable).

## 2018-05-12 ENCOUNTER — Encounter (HOSPITAL_COMMUNITY)
Admission: RE | Admit: 2018-05-12 | Discharge: 2018-05-12 | Disposition: A | Discharge status: Home / Self Care | Payer: Medicare Other | Source: Ambulatory Visit | Attending: Cardiology | Admitting: Cardiology

## 2018-05-12 ENCOUNTER — Ambulatory Visit (HOSPITAL_COMMUNITY)
Admission: RE | Admit: 2018-05-12 | Discharge: 2018-05-12 | Disposition: A | Discharge status: Home / Self Care | Payer: Medicare Other | Source: Ambulatory Visit | Attending: Cardiology | Admitting: Cardiology

## 2018-05-12 DIAGNOSIS — I272 Pulmonary hypertension, unspecified: Secondary | ICD-10-CM

## 2018-05-12 LAB — PULMONARY FUNCTION TEST
DL/VA % PRED: 81 %
DL/VA: 3.31 ml/min/mmHg/L
DLCO unc % pred: 51 %
DLCO unc: 10.67 ml/min/mmHg
FEF 25-75 Post: 1.79 L/sec
FEF 25-75 Pre: 0.81 L/sec
FEF2575-%Change-Post: 119 %
FEF2575-%Pred-Post: 103 %
FEF2575-%Pred-Pre: 47 %
FEV1-%Change-Post: 29 %
FEV1-%Pred-Post: 74 %
FEV1-%Pred-Pre: 57 %
FEV1-Post: 1.45 L
FEV1-Pre: 1.12 L
FEV1FVC-%Change-Post: -2 %
FEV1FVC-%Pred-Pre: 95 %
FEV6-%Change-Post: 32 %
FEV6-%Pred-Post: 83 %
FEV6-%Pred-Pre: 62 %
FEV6-Post: 2 L
FEV6-Pre: 1.51 L
FEV6FVC-%Change-Post: 0 %
FEV6FVC-%Pred-Post: 103 %
FEV6FVC-%Pred-Pre: 102 %
FVC-%Change-Post: 32 %
FVC-%PRED-PRE: 61 %
FVC-%Pred-Post: 80 %
FVC-Post: 2.02 L
FVC-Pre: 1.53 L
Post FEV1/FVC ratio: 72 %
Post FEV6/FVC ratio: 99 %
Pre FEV1/FVC ratio: 73 %
Pre FEV6/FVC Ratio: 99 %
RV % pred: 136 %
RV: 3.21 L
TLC % pred: 93 %
TLC: 5.01 L

## 2018-05-12 MED ORDER — TECHNETIUM TC 99M DIETHYLENETRIAME-PENTAACETIC ACID
32.3000 | Freq: Once | INTRAVENOUS | Status: AC | PRN
  Administered 2018-05-12: 32.3 via RESPIRATORY_TRACT

## 2018-05-12 MED ORDER — ALBUTEROL SULFATE (2.5 MG/3ML) 0.083% IN NEBU
2.5000 mg | INHALATION_SOLUTION | Freq: Once | RESPIRATORY_TRACT | Status: AC
  Administered 2018-05-12: 2.5 mg via RESPIRATORY_TRACT

## 2018-05-12 MED ORDER — TECHNETIUM TO 99M ALBUMIN AGGREGATED
4.4000 | Freq: Once | INTRAVENOUS | Status: AC | PRN
  Administered 2018-05-12: 4.4 via INTRAVENOUS

## 2018-05-19 ENCOUNTER — Ambulatory Visit (HOSPITAL_COMMUNITY)
Admission: RE | Admit: 2018-05-19 | Discharge: 2018-05-19 | Disposition: A | Discharge status: Home / Self Care | Payer: Medicare Other | Source: Ambulatory Visit | Attending: Cardiology | Admitting: Cardiology

## 2018-05-19 ENCOUNTER — Encounter (HOSPITAL_COMMUNITY): Payer: Self-pay | Admitting: Cardiology

## 2018-05-19 ENCOUNTER — Telehealth (HOSPITAL_COMMUNITY): Payer: Self-pay

## 2018-05-19 VITALS — BP 160/90 | HR 82 | Wt 145.0 lb

## 2018-05-19 DIAGNOSIS — Z720 Tobacco use: Secondary | ICD-10-CM

## 2018-05-19 DIAGNOSIS — F17210 Nicotine dependence, cigarettes, uncomplicated: Secondary | ICD-10-CM | POA: Insufficient documentation

## 2018-05-19 DIAGNOSIS — Z79899 Other long term (current) drug therapy: Secondary | ICD-10-CM | POA: Insufficient documentation

## 2018-05-19 DIAGNOSIS — Z9049 Acquired absence of other specified parts of digestive tract: Secondary | ICD-10-CM | POA: Insufficient documentation

## 2018-05-19 DIAGNOSIS — I110 Hypertensive heart disease with heart failure: Secondary | ICD-10-CM | POA: Diagnosis present

## 2018-05-19 DIAGNOSIS — I5032 Chronic diastolic (congestive) heart failure: Secondary | ICD-10-CM | POA: Insufficient documentation

## 2018-05-19 DIAGNOSIS — J449 Chronic obstructive pulmonary disease, unspecified: Secondary | ICD-10-CM | POA: Diagnosis not present

## 2018-05-19 DIAGNOSIS — I272 Pulmonary hypertension, unspecified: Secondary | ICD-10-CM | POA: Insufficient documentation

## 2018-05-19 MED ORDER — FUROSEMIDE 40 MG PO TABS: 20.0000 mg | ORAL_TABLET | Freq: Every day | ORAL | 3 refills | Status: DC

## 2018-05-19 MED ORDER — MACITENTAN 10 MG PO TABS: 10.0000 mg | ORAL_TABLET | Freq: Every day | ORAL | 3 refills | Status: DC

## 2018-05-19 NOTE — Patient Instructions (Signed)
Labs were done today. We will call you with any ABNORMAL results. No news is good news!  BEGIN taking Opsumit 7m (1 tab) each day.  DECREASE your Lasix to 247meach day.  You completed a 6 minute walk to assess your oxygen saturation during activity.   Please follow up lab work in 10 days.  Your physician recommends that you schedule a follow-up appointment in: 6 weeks.

## 2018-05-19 NOTE — Progress Notes (Signed)
6 minute walk: Start of walk pt 96%, HR 71.  Pt ambulated 298 meters. Denies dizziness. 02 range 96-84%. HR range 102-113.  At completion of walk O2 91%, HR 106.  No complaints noted.

## 2018-05-19 NOTE — Telephone Encounter (Signed)
-----  Message from Larey Dresser, MD sent at 05/12/2018  4:09 PM EST ----- No evidence for chronic PE

## 2018-05-19 NOTE — Telephone Encounter (Signed)
Informed pt of perf and vent test results, pt verbalized understanding and was agreeable

## 2018-05-20 ENCOUNTER — Telehealth (HOSPITAL_COMMUNITY): Payer: Self-pay

## 2018-05-20 LAB — CENTROMERE ANTIBODIES: Centromere Ab Screen: <0.2 AI (ref 0.0–0.9)

## 2018-05-20 LAB — HIV ANTIBODY (ROUTINE TESTING W REFLEX): HIV Screen 4th Generation wRfx: NONREACTIVE

## 2018-05-20 LAB — ANTI-SCLERODERMA ANTIBODY: Scleroderma (Scl-70) (ENA) Antibody, IgG: <0.2 AI (ref 0.0–0.9)

## 2018-05-20 LAB — RHEUMATOID FACTOR

## 2018-05-20 NOTE — Telephone Encounter (Signed)
Pt in clinic yesterday, signed paperwork for Opsumit enrollment however [employer] form not signed. Form mailed to patient to sign.  Pt aware to expect form ion mail, sign and mail back or drop off at office.  Pt amenable.

## 2018-05-21 NOTE — Progress Notes (Signed)
PCP: Dr. Karie Kirks Cardiology: Dr. Harrington Challenger HF Cardiology: Dr. Aundra Dubin  73 yo smoker with COPD and HTN presents for evaluation of pulmonary hypertension.  Echo in 1/20 showed severe RV dilation and dysfunction.  RHC in 1/20 showed moderate pulmonary hypertension with elevated PVR and low cardiac output.  PCWP was normal.  PFTs showed moderate-severe obstruction.  V/Q scan showed no chronic PE.   She comes in with her son today.  She has stable dyspnea after walking about 50 yards.  She fatigues easily. Generally, she does ok walking on flat ground for short distances.  On 6 minute walk today, he oxygen dropped to 84% transiently but was generally > 88%.  No lightheadedness or chest pain.   6 minute walk (2/20): 298 m  Labs (1/20): K 4, creatinine 0.8, TSH normal, BNP 994  PMH: 1. HTN 2. COPD: Active smoker.  - PFTs (2/20): FVC 51%, FEV1 57%, ratio 72%, TLC 93%, DLCO 51% => moderate-severe obstructive disease.  3. H/o cholecystectomy 4. OA 5. H/o back surgery 6. Pulmonary hypertension:  - Echo (1/20): EF 60-65%, mild LVH, severe RV dilation with severely decreased RV systolic function.  - RHC (1/20): mean RA 10, PA 55/21 mean 36, mean PCWP 7, CI 1.6, PVR 10 WU.  - V/Q scan (2/20): Not suggestive of chronic PE.  - CTA chest (1/20): No PE, no evidence for ILD.   Family History  Problem Relation Age of Onset  . Cancer Sister   . Cancer Brother   . Cancer Maternal Aunt   . Colon cancer Neg Hx    Social History   Socioeconomic History  . Marital status: Married    Spouse name: Not on file  . Number of children: Not on file  . Years of education: Not on file  . Highest education level: Not on file  Occupational History  . Not on file  Social Needs  . Financial resource strain: Not on file  . Food insecurity:    Worry: Not on file    Inability: Not on file  . Transportation needs:    Medical: Not on file    Non-medical: Not on file  Tobacco Use  . Smoking status: Current Every  Day Smoker    Packs/day: 0.75    Years: 15.00    Pack years: 11.25    Types: Cigarettes  . Smokeless tobacco: Never Used  Substance and Sexual Activity  . Alcohol use: No  . Drug use: No  . Sexual activity: Not on file  Lifestyle  . Physical activity:    Days per week: Not on file    Minutes per session: Not on file  . Stress: Not on file  Relationships  . Social connections:    Talks on phone: Not on file    Gets together: Not on file    Attends religious service: Not on file    Active member of club or organization: Not on file    Attends meetings of clubs or organizations: Not on file    Relationship status: Not on file  . Intimate partner violence:    Fear of current or ex partner: Not on file    Emotionally abused: Not on file    Physically abused: Not on file    Forced sexual activity: Not on file  Other Topics Concern  . Not on file  Social History Narrative  . Not on file   ROS: All systems reviewed and negative except as per HPI.   Current  Outpatient Medications  Medication Sig Dispense Refill  . clonazePAM (KLONOPIN) 1 MG tablet Take 1 mg by mouth 4 (four) times daily as needed (For nerves).     . furosemide (LASIX) 40 MG tablet Take 0.5 tablets (20 mg total) by mouth daily. 30 tablet 3  . gabapentin (NEURONTIN) 300 MG capsule Take 300 mg by mouth 3 (three) times daily.    Marland Kitchen HYDROcodone-acetaminophen (NORCO) 10-325 MG tablet Take 1 tablet by mouth every 4 (four) hours as needed (pain).     Marland Kitchen ibuprofen (ADVIL,MOTRIN) 200 MG tablet Take 400 mg by mouth 2 (two) times daily as needed (pain).    Marland Kitchen MAGNESIUM PO Take 1 tablet by mouth daily.    . pantoprazole (PROTONIX) 40 MG tablet Take 40 mg by mouth at bedtime.     Marland Kitchen spironolactone (ALDACTONE) 25 MG tablet Take 25 mg by mouth daily.    . macitentan (OPSUMIT) 10 MG tablet Take 1 tablet (10 mg total) by mouth daily. 30 tablet 3   No current facility-administered medications for this encounter.    BP (!) 160/90    Pulse 82   Wt 65.8 kg (145 lb)   SpO2 96%   BMI 23.40 kg/m  General: NAD Neck: JVP 8 cm, no thyromegaly or thyroid nodule.  Lungs: Distant BS CV: Nondisplaced PMI.  Heart regular S1/S2, no S3/S4, no murmur.  No peripheral edema.  No carotid bruit.  Normal pedal pulses.  Abdomen: Soft, nontender, no hepatosplenomegaly, no distention.  Skin: Intact without lesions or rashes.  Neurologic: Alert and oriented x 3.  Psych: Normal affect. Extremities: No clubbing or cyanosis.  HEENT: Normal.   Assessment/Plan: 1. Pulmonary hypertension: Echo 1/20 with severe RV dilation/dysfunction.  RHC with moderate PH, high PVR, normal PCWP, low cardiac output.  No evidence for chronic PE by V/Q scan.  PFTs show a moderate to severe obstructive defect.  She still smokes.  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  However, cannot rule out a group 1 component as elevated PVR seems somewhat out of proportion to lung disease.  - Send HIV, ANA, anti-centromere Ab, anti-SCL70 Ab, ANA, RF.  - Baseline 6 minute walk done today.  - I will give her a trial of Opsumit.  If she has response to this and feels better with improved 6 minute walk, would add on a 2nd [place] med given suspicion at that point for significant group 1 PAH.  2. Chronic diastolic CHF/RV failure: Elevated BNP with mild volume overload on exam.  - Start Lasix 20 mg daily BMET in 10 days.   3. COPD/smoking: She still smokes 5 cigs/day.  She is working on quitting.  4. HTN: Followup BP next appt with use of daily Lasix.   Followup in 6 wks.   Loralie Champagne 05/21/2018

## 2018-05-30 ENCOUNTER — Other Ambulatory Visit (HOSPITAL_COMMUNITY)
Admission: RE | Admit: 2018-05-30 | Discharge: 2018-05-30 | Disposition: A | Discharge status: Home / Self Care | Payer: Medicare Other | Source: Ambulatory Visit | Attending: Cardiology | Admitting: Cardiology

## 2018-05-30 DIAGNOSIS — I509 Heart failure, unspecified: Secondary | ICD-10-CM | POA: Insufficient documentation

## 2018-05-30 LAB — BASIC METABOLIC PANEL
Anion gap: 11 (ref 5–15)
BUN: 27 mg/dL — ABNORMAL HIGH (ref 8–23)
CHLORIDE: 88 mmol/L — AB (ref 98–111)
CO2: 32 mmol/L (ref 22–32)
Calcium: 9.1 mg/dL (ref 8.9–10.3)
Creatinine, Ser: 0.79 mg/dL (ref 0.44–1.00)
GFR calc Af Amer: >60 mL/min (ref >60)
GFR calc non Af Amer: >60 mL/min (ref >60)
Glucose, Bld: 106 mg/dL — ABNORMAL HIGH (ref 70–99)
Potassium: 3.8 mmol/L (ref 3.5–5.1)
Sodium: 131 mmol/L — ABNORMAL LOW (ref 135–145)

## 2018-06-08 NOTE — Telephone Encounter (Signed)
Received signed [employer] form back from patient signed.  Enrollment forms for Opsumit faxed to [employer].  Confirmation received.

## 2018-06-10 NOTE — Telephone Encounter (Signed)
PA for Opsumit faxed to [employer] with office notes.

## 2018-06-13 ENCOUNTER — Telehealth (HOSPITAL_COMMUNITY): Payer: Self-pay

## 2018-06-13 NOTE — Telephone Encounter (Signed)
Prior authorization through [employer] was APPROVED for [place] and will expire on 04/06/19.  Approval notice faxed to [employer].

## 2018-07-01 ENCOUNTER — Encounter (HOSPITAL_COMMUNITY): Payer: Medicare Other | Admitting: Cardiology

## 2018-07-26 ENCOUNTER — Other Ambulatory Visit: Payer: Self-pay

## 2018-07-26 ENCOUNTER — Observation Stay (HOSPITAL_COMMUNITY)
Admission: EM | Admit: 2018-07-26 | Discharge: 2018-07-27 | Disposition: A | Discharge status: Home / Self Care | Payer: Medicare Other | Attending: Family Medicine | Admitting: Family Medicine

## 2018-07-26 ENCOUNTER — Emergency Department (HOSPITAL_COMMUNITY): Payer: Medicare Other

## 2018-07-26 ENCOUNTER — Encounter (HOSPITAL_COMMUNITY): Payer: Self-pay | Admitting: Emergency Medicine

## 2018-07-26 DIAGNOSIS — K219 Gastro-esophageal reflux disease without esophagitis: Secondary | ICD-10-CM | POA: Insufficient documentation

## 2018-07-26 DIAGNOSIS — F17210 Nicotine dependence, cigarettes, uncomplicated: Secondary | ICD-10-CM | POA: Diagnosis not present

## 2018-07-26 DIAGNOSIS — R0602 Shortness of breath: Secondary | ICD-10-CM

## 2018-07-26 DIAGNOSIS — I272 Pulmonary hypertension, unspecified: Secondary | ICD-10-CM | POA: Insufficient documentation

## 2018-07-26 DIAGNOSIS — E871 Hypo-osmolality and hyponatremia: Secondary | ICD-10-CM | POA: Insufficient documentation

## 2018-07-26 DIAGNOSIS — R197 Diarrhea, unspecified: Secondary | ICD-10-CM | POA: Diagnosis not present

## 2018-07-26 DIAGNOSIS — I110 Hypertensive heart disease with heart failure: Secondary | ICD-10-CM | POA: Diagnosis not present

## 2018-07-26 DIAGNOSIS — R0902 Hypoxemia: Secondary | ICD-10-CM | POA: Insufficient documentation

## 2018-07-26 DIAGNOSIS — F419 Anxiety disorder, unspecified: Secondary | ICD-10-CM | POA: Insufficient documentation

## 2018-07-26 DIAGNOSIS — J441 Chronic obstructive pulmonary disease with (acute) exacerbation: Principal | ICD-10-CM | POA: Diagnosis present

## 2018-07-26 DIAGNOSIS — R1110 Vomiting, unspecified: Secondary | ICD-10-CM | POA: Insufficient documentation

## 2018-07-26 DIAGNOSIS — I5032 Chronic diastolic (congestive) heart failure: Secondary | ICD-10-CM | POA: Insufficient documentation

## 2018-07-26 DIAGNOSIS — Z79899 Other long term (current) drug therapy: Secondary | ICD-10-CM | POA: Diagnosis not present

## 2018-07-26 DIAGNOSIS — Z20828 Contact with and (suspected) exposure to other viral communicable diseases: Secondary | ICD-10-CM | POA: Diagnosis not present

## 2018-07-26 DIAGNOSIS — J449 Chronic obstructive pulmonary disease, unspecified: Secondary | ICD-10-CM

## 2018-07-26 LAB — COMPREHENSIVE METABOLIC PANEL
ALT: 15 U/L (ref 0–44)
AST: 16 U/L (ref 15–41)
Albumin: 3.5 g/dL (ref 3.5–5.0)
Alkaline Phosphatase: 80 U/L (ref 38–126)
Anion gap: 12 (ref 5–15)
BUN: 13 mg/dL (ref 8–23)
CO2: 28 mmol/L (ref 22–32)
Calcium: 8.9 mg/dL (ref 8.9–10.3)
Chloride: 88 mmol/L — ABNORMAL LOW (ref 98–111)
Creatinine, Ser: 0.63 mg/dL (ref 0.44–1.00)
GFR calc Af Amer: >60 mL/min (ref >60)
GFR calc non Af Amer: >60 mL/min (ref >60)
Glucose, Bld: 90 mg/dL (ref 70–99)
Potassium: 3.6 mmol/L (ref 3.5–5.1)
Sodium: 128 mmol/L — ABNORMAL LOW (ref 135–145)
Total Bilirubin: 0.8 mg/dL (ref 0.3–1.2)
Total Protein: 6.6 g/dL (ref 6.5–8.1)

## 2018-07-26 LAB — URINALYSIS, ROUTINE W REFLEX MICROSCOPIC
Bilirubin Urine: NEGATIVE
Glucose, UA: NEGATIVE mg/dL
Hgb urine dipstick: NEGATIVE
Ketones, ur: 20 mg/dL — AB
Leukocytes,Ua: NEGATIVE
Nitrite: NEGATIVE
Protein, ur: NEGATIVE mg/dL
Specific Gravity, Urine: 1.013 (ref 1.005–1.030)
pH: 9 — ABNORMAL HIGH (ref 5.0–8.0)

## 2018-07-26 LAB — CBC WITH DIFFERENTIAL/PLATELET
Abs Immature Granulocytes: 0.03 10*3/uL (ref 0.00–0.07)
Basophils Absolute: 0 10*3/uL (ref 0.0–0.1)
Basophils Relative: 1 %
Eosinophils Absolute: 0.1 10*3/uL (ref 0.0–0.5)
Eosinophils Relative: 2 %
HCT: 49 % — ABNORMAL HIGH (ref 36.0–46.0)
Hemoglobin: 14.6 g/dL (ref 12.0–15.0)
Immature Granulocytes: 1 %
Lymphocytes Relative: 12 %
Lymphs Abs: 0.5 10*3/uL — ABNORMAL LOW (ref 0.7–4.0)
MCH: 23.5 pg — ABNORMAL LOW (ref 26.0–34.0)
MCHC: 29.8 g/dL — ABNORMAL LOW (ref 30.0–36.0)
MCV: 78.8 fL — ABNORMAL LOW (ref 80.0–100.0)
Monocytes Absolute: 0.3 10*3/uL (ref 0.1–1.0)
Monocytes Relative: 7 %
Neutro Abs: 3.3 10*3/uL (ref 1.7–7.7)
Neutrophils Relative %: 77 %
Platelets: 129 10*3/uL — ABNORMAL LOW (ref 150–400)
RBC: 6.22 MIL/uL — ABNORMAL HIGH (ref 3.87–5.11)
RDW: 19.5 % — ABNORMAL HIGH (ref 11.5–15.5)
WBC: 4.2 10*3/uL (ref 4.0–10.5)
nRBC: 1.4 % — ABNORMAL HIGH (ref 0.0–0.2)

## 2018-07-26 LAB — SARS CORONAVIRUS 2 BY RT PCR (HOSPITAL ORDER, PERFORMED IN ~~LOC~~ HOSPITAL LAB): SARS Coronavirus 2: NEGATIVE

## 2018-07-26 LAB — TROPONIN I: Troponin I: <0.03 ng/mL (ref <0.03)

## 2018-07-26 LAB — ABO/RH: ABO/RH(D): B POS

## 2018-07-26 MED ORDER — CLONAZEPAM 0.5 MG PO TABS
1.0000 mg | ORAL_TABLET | Freq: Every day | ORAL | Status: DC | PRN
  Administered 2018-07-26 – 2018-07-27 (×2): 1 mg via ORAL
  Filled 2018-07-26 (×2): qty 2

## 2018-07-26 MED ORDER — ALBUTEROL SULFATE HFA 108 (90 BASE) MCG/ACT IN AERS
2.0000 | INHALATION_SPRAY | Freq: Once | RESPIRATORY_TRACT | Status: AC
  Administered 2018-07-26: 2 via RESPIRATORY_TRACT
  Filled 2018-07-26: qty 6.7

## 2018-07-26 MED ORDER — SODIUM CHLORIDE 0.9% FLUSH: 3.0000 mL | Freq: Two times a day (BID) | INTRAVENOUS | Status: DC

## 2018-07-26 MED ORDER — ACETAMINOPHEN 325 MG PO TABS
650.0000 mg | ORAL_TABLET | Freq: Four times a day (QID) | ORAL | Status: DC | PRN
  Administered 2018-07-26 – 2018-07-27 (×2): 650 mg via ORAL
  Filled 2018-07-26 (×2): qty 2

## 2018-07-26 MED ORDER — METHYLPREDNISOLONE SODIUM SUCC 125 MG IJ SOLR
60.0000 mg | Freq: Two times a day (BID) | INTRAMUSCULAR | Status: DC
  Administered 2018-07-26 – 2018-07-27 (×2): 60 mg via INTRAVENOUS
  Filled 2018-07-26 (×2): qty 2

## 2018-07-26 MED ORDER — MACITENTAN 10 MG PO TABS
10.0000 mg | ORAL_TABLET | Freq: Every day | ORAL | Status: DC
  Filled 2018-07-26 (×2): qty 1

## 2018-07-26 MED ORDER — ONDANSETRON HCL 4 MG PO TABS: 4.0000 mg | ORAL_TABLET | Freq: Four times a day (QID) | ORAL | Status: DC | PRN

## 2018-07-26 MED ORDER — POLYETHYLENE GLYCOL 3350 17 G PO PACK: 17.0000 g | PACK | Freq: Every day | ORAL | Status: DC | PRN

## 2018-07-26 MED ORDER — IPRATROPIUM-ALBUTEROL 20-100 MCG/ACT IN AERS
1.0000 | INHALATION_SPRAY | Freq: Four times a day (QID) | RESPIRATORY_TRACT | Status: DC
  Administered 2018-07-26: 21:00:00 1 via RESPIRATORY_TRACT
  Filled 2018-07-26: qty 4

## 2018-07-26 MED ORDER — SODIUM CHLORIDE 0.9 % IV BOLUS
1000.0000 mL | Freq: Once | INTRAVENOUS | Status: AC
  Administered 2018-07-26: 1000 mL via INTRAVENOUS

## 2018-07-26 MED ORDER — ACETAMINOPHEN 650 MG RE SUPP: 650.0000 mg | Freq: Four times a day (QID) | RECTAL | Status: DC | PRN

## 2018-07-26 MED ORDER — ONDANSETRON HCL 4 MG/2ML IJ SOLN
4.0000 mg | Freq: Once | INTRAMUSCULAR | Status: AC
  Administered 2018-07-26: 12:00:00 4 mg via INTRAVENOUS
  Filled 2018-07-26: qty 2

## 2018-07-26 MED ORDER — ALBUTEROL SULFATE HFA 108 (90 BASE) MCG/ACT IN AERS
2.0000 | INHALATION_SPRAY | RESPIRATORY_TRACT | Status: DC | PRN
  Filled 2018-07-26: qty 6.7

## 2018-07-26 MED ORDER — IPRATROPIUM-ALBUTEROL 20-100 MCG/ACT IN AERS
1.0000 | INHALATION_SPRAY | Freq: Two times a day (BID) | RESPIRATORY_TRACT | Status: DC
  Administered 2018-07-27: 1 via RESPIRATORY_TRACT
  Filled 2018-07-26: qty 4

## 2018-07-26 MED ORDER — ENOXAPARIN SODIUM 40 MG/0.4ML ~~LOC~~ SOLN
40.0000 mg | SUBCUTANEOUS | Status: DC
  Administered 2018-07-26: 20:00:00 40 mg via SUBCUTANEOUS
  Filled 2018-07-26: qty 0.4

## 2018-07-26 MED ORDER — ONDANSETRON HCL 4 MG/2ML IJ SOLN: 4.0000 mg | Freq: Four times a day (QID) | INTRAMUSCULAR | Status: DC | PRN

## 2018-07-26 MED ORDER — POTASSIUM CHLORIDE IN NACL 20-0.9 MEQ/L-% IV SOLN
INTRAVENOUS | Status: DC
  Administered 2018-07-26: 22:00:00 via INTRAVENOUS

## 2018-07-26 NOTE — ED Notes (Signed)
ED TO INPATIENT HANDOFF REPORT  ED Nurse Name and Phone #:   S Name/Age/Gender [name] 73 y.o. female Room/Bed: APA04/APA04  Code Status   Code Status: Prior  Home/SNF/Other  {Patient oriented KG:URKYHC of what day it was Is this baseline?   Triage Complete: Triage complete  Chief Complaint Emesis  Triage Note Vomited twice this am.  Denies any pain at this time.  Have not be around anyone that has been sick.    Allergies Allergies  Allergen Reactions  . Chantix [Varenicline] Nausea And Vomiting  . Codeine Other (See Comments)    hallucinations  . Oxycodone Palpitations    Level of Care/Admitting Diagnosis ED Disposition    ED Disposition Condition [facility] Area: [facility] [[id number]]  Level of Care: Med-Surg [16]  Covid Evaluation: N/A  Diagnosis: COPD exacerbation [facility]) [[id number]]  Admitting Physician: Bethena Roys [phone]  Attending Physician: Bethena Roys [name]  PT Class (Do Not Modify): Observation [104]  PT Acc Code (Do Not Modify): Observation [10022]       B Medical/Surgery History Past Medical History:  Diagnosis Date  . Arthritis   . Chronic back pain   . GERD (gastroesophageal reflux disease)   . Hypertension   . Thyroid disease   . Ulcer    Billroth I   Past Surgical History:  Procedure Laterality Date  . ABDOMINAL HYSTERECTOMY    . BACK SURGERY     X4  . BILROTH I PROCEDURE    . CHOLECYSTECTOMY    . COLONOSCOPY   05/18/2003   [id number] colonoscopy/ Internal hemorrhoids.  Otherwise, normal rectum  . COLONOSCOPY  08/12/2010   Dr. Arnoldo Morale: cecum visualized and normal, colon and rectum normal. Torturous colon  . COLONOSCOPY N/A 09/07/2012   RMR: tubular adenoma, lipoma. Due for surveillance in 2021  . ESOPHAGOGASTRODUODENOSCOPY  05/18/2003   RMR:. Normal esophagus/Adenomatous-appearing mucosa at the anastomosis  . ESOPHAGOGASTRODUODENOSCOPY  08/12/2010   Dr. Arnoldo Morale:  anastomosis widely patent, no ulcerations, CLO test negative  . RIGHT HEART CATH N/A 05/04/2018   Procedure: RIGHT HEART CATH;  Surgeon: Larey Dresser, MD;  Location: [place] [facility];  Service: Cardiovascular;  Laterality: N/A;  . STOMACH SURGERY     removed partial stomach  . YAG LASER APPLICATION Left 62/70/3500   Procedure: YAG LASER APPLICATION;  Surgeon: [name], MD;  Location: [facility];  Service: Ophthalmology;  Laterality: Left;  . YAG LASER APPLICATION Right 93/81/8299   Procedure: YAG LASER APPLICATION;  Surgeon: [name], MD;  Location: [facility];  Service: Ophthalmology;  Laterality: Right;     A IV Location/Drains/Wounds Patient Lines/Drains/Airways Status   Active Line/Drains/Airways    Name:   Placement date:   Placement time:   Site:   Days:   Peripheral IV 07/26/18 Right Wrist   07/26/18    1155    Wrist   less than 1          Intake/Output Last 24 hours  Intake/Output Summary (Last 24 hours) at 07/26/2018 1935 Last data filed at 07/26/2018 1200 Gross per 24 hour  Intake 1000 ml  Output -  Net 1000 ml    Labs/Imaging Results for orders placed or performed during the hospital encounter of 07/26/18 (from the past 48 hour(s))  Urinalysis, Routine w reflex microscopic     Status: Abnormal   Collection Time: 07/26/18 10:03 AM  Result Value Ref Range   Color, Urine YELLOW YELLOW   APPearance  CLEAR CLEAR   Specific Gravity, Urine 1.013 1.005 - 1.030   pH 9.0 (H) 5.0 - 8.0   Glucose, UA NEGATIVE NEGATIVE mg/dL   Hgb urine dipstick NEGATIVE NEGATIVE   Bilirubin Urine NEGATIVE NEGATIVE   Ketones, ur 20 (A) NEGATIVE mg/dL   Protein, ur NEGATIVE NEGATIVE mg/dL   Nitrite NEGATIVE NEGATIVE   Leukocytes,Ua NEGATIVE NEGATIVE    Comment: Performed at [facility], [address] [place], [place] [address]  CBC with Differential/Platelet     Status: Abnormal   Collection Time: 07/26/18 10:28 AM  Result Value Ref Range   WBC 4.2 4.0 - 10.5 K/uL   RBC 6.22 (H)  3.87 - 5.11 MIL/uL   Hemoglobin 14.6 12.0 - 15.0 g/dL   HCT 49.0 (H) 36.0 - 46.0 %   MCV 78.8 (L) 80.0 - 100.0 fL   MCH 23.5 (L) 26.0 - 34.0 pg   MCHC 29.8 (L) 30.0 - 36.0 g/dL   RDW 19.5 (H) 11.5 - 15.5 %   Platelets 129 (L) 150 - 400 K/uL   nRBC 1.4 (H) 0.0 - 0.2 %   Neutrophils Relative % 77 %   Neutro Abs 3.3 1.7 - 7.7 K/uL   Lymphocytes Relative 12 %   Lymphs Abs 0.5 (L) 0.7 - 4.0 K/uL   Monocytes Relative 7 %   Monocytes Absolute 0.3 0.1 - 1.0 K/uL   Eosinophils Relative 2 %   Eosinophils Absolute 0.1 0.0 - 0.5 K/uL   Basophils Relative 1 %   Basophils Absolute 0.0 0.0 - 0.1 K/uL   Immature Granulocytes 1 %   Abs Immature Granulocytes 0.03 0.00 - 0.07 K/uL    Comment: Performed at [facility], [address] [place], [place] [address]  Comprehensive metabolic panel     Status: Abnormal   Collection Time: 07/26/18 10:28 AM  Result Value Ref Range   Sodium 128 (L) 135 - 145 mmol/L   Potassium 3.6 3.5 - 5.1 mmol/L   Chloride 88 (L) 98 - 111 mmol/L   CO2 28 22 - 32 mmol/L   Glucose, Bld 90 70 - 99 mg/dL   BUN 13 8 - 23 mg/dL   Creatinine, Ser 0.63 0.44 - 1.00 mg/dL   Calcium 8.9 8.9 - 10.3 mg/dL   Total Protein 6.6 6.5 - 8.1 g/dL   Albumin 3.5 3.5 - 5.0 g/dL   AST 16 15 - 41 U/L   ALT 15 0 - 44 U/L   Alkaline Phosphatase 80 38 - 126 U/L   Total Bilirubin 0.8 0.3 - 1.2 mg/dL   GFR calc non Af Amer >60 >60 mL/min   GFR calc Af Amer >60 >60 mL/min   Anion gap 12 5 - 15    Comment: Performed at [facility], [address], [place], [place] [address]  Troponin I - ONCE - STAT     Status: None   Collection Time: 07/26/18 10:28 AM  Result Value Ref Range   Troponin I <0.03 <0.03 ng/mL    Comment: Performed at [facility], [address] [place], [place] [address]  ABO/Rh     Status: None   Collection Time: 07/26/18  3:38 PM  Result Value Ref Range   ABO/RH(D)      B POS Performed at [facility], [address] [place],  [address]   SARS Coronavirus 2  [facility] order, Performed in [place] hospital lab)     Status: None   Collection Time: 07/26/18  4:08 PM  Result  Value Ref Range   SARS Coronavirus 2 NEGATIVE NEGATIVE    Comment: (NOTE) If result is NEGATIVE SARS-CoV-2 target nucleic acids are NOT DETECTED. The SARS-CoV-2 RNA is generally detectable in upper and lower  respiratory specimens during the acute phase of infection. The lowest  concentration of SARS-CoV-2 viral copies this assay can detect is 250  copies / mL. A negative result does not preclude SARS-CoV-2 infection  and should not be used as the sole basis for treatment or other  patient management decisions.  A negative result may occur with  improper specimen collection / handling, submission of specimen other  than nasopharyngeal swab, presence of viral mutation(s) within the  areas targeted by this assay, and inadequate number of viral copies  (<250 copies / mL). A negative result must be combined with clinical  observations, patient history, and epidemiological information. If result is POSITIVE SARS-CoV-2 target nucleic acids are DETECTED. The SARS-CoV-2 RNA is generally detectable in upper and lower  respiratory specimens dur ing the acute phase of infection.  Positive  results are indicative of active infection with SARS-CoV-2.  Clinical  correlation with patient history and other diagnostic information is  necessary to determine patient infection status.  Positive results do  not rule out bacterial infection or co-infection with other viruses. If result is PRESUMPTIVE POSTIVE SARS-CoV-2 nucleic acids MAY BE PRESENT.   A presumptive positive result was obtained on the submitted specimen  and confirmed on repeat testing.  While 2019 novel coronavirus  (SARS-CoV-2) nucleic acids may be present in the submitted sample  additional confirmatory testing may be necessary for epidemiological  and / or clinical management purposes  to differentiate between   SARS-CoV-2 and other Sarbecovirus currently known to infect humans.  If clinically indicated additional testing with an alternate test  methodology [phone]) is advised. The SARS-CoV-2 RNA is generally  detectable in upper and lower respiratory sp ecimens during the acute  phase of infection. The expected result is Negative. Fact Sheet for Patients:  StrictlyIdeas.no Fact Sheet for Healthcare Providers: BankingDealers.co.za This test is not yet approved or cleared by the [place] FDA and has been authorized for detection and/or diagnosis of SARS-CoV-2 by FDA under an Emergency Use Authorization (EUA).  This EUA will remain in effect (meaning this test can be used) for the duration of the COVID-19 declaration under Section 564(b)(1) of the Act, 21 U.S.C. section 360bbb-3(b)(1), unless the authorization is terminated or revoked sooner. Performed at [facility], [address], [place], [place] [address]    Dg Chest Port 1 View  Result Date: 07/26/2018 CLINICAL DATA:  Cough. EXAM: PORTABLE CHEST 1 VIEW COMPARISON:  Radiographs of May 12, 2018. FINDINGS: Stable cardiomegaly. Atherosclerosis of thoracic aorta is noted. No pneumothorax or pleural effusion is noted. No acute pulmonary disease is noted. Bony thorax is unremarkable. IMPRESSION: No acute cardiopulmonary abnormality seen. Aortic Atherosclerosis (ICD10-I70.0). Electronically Signed   By: Marijo Conception M.D.   On: 07/26/2018 12:39    Pending Labs Unresulted Labs (From admission, onward)   None      Vitals/Pain Today's Vitals   07/26/18 1700 07/26/18 1800 07/26/18 1859 07/26/18 1900  BP: (!) 106/42 (!) 102/49  (!) 113/43  Pulse: (!) 59 67  61  Resp: 15 (!) 21  19  Temp:   98.6 F (37 C)   TempSrc:   Oral   SpO2: 96% 91% 96% 96%  Weight:      Height:      PainSc:  Isolation Precautions No active isolations  Medications Medications  sodium chloride  flush (NS) 0.9 % injection 3 mL (3 mLs Intravenous Not Given 07/26/18 1609)  methylPREDNISolone sodium succinate (SOLU-MEDROL) 125 mg/2 mL injection 60 mg (60 mg Intravenous Given 07/26/18 1905)  sodium chloride 0.9 % bolus 1,000 mL (0 mLs Intravenous Stopped 07/26/18 1200)  ondansetron (ZOFRAN) injection 4 mg (4 mg Intravenous Given 07/26/18 1157)  albuterol (VENTOLIN HFA) 108 (90 Base) MCG/ACT inhaler 2 puff (2 puffs Inhalation Given 07/26/18 1321)    Mobility ambulatory   Focused Assessments    R Recommendations: See Admitting Provider Note  Report given to:   Additional Notes:

## 2018-07-26 NOTE — ED Notes (Addendum)
Pt offered something to eat, pt refused at present time, states " I dont want anything now" pt given ginger ale per request,

## 2018-07-26 NOTE — Progress Notes (Signed)
CSW attempted to contact pt on patient phone. Line busy. CSW will continue to reach out to patient regarding medicare observation notice. Pt currently on precaution.

## 2018-07-26 NOTE — ED Provider Notes (Signed)
[facility] EMERGENCY DEPARTMENT Provider Note   CSN: [id number] Arrival date & time: 07/26/18  0909    History   Chief Complaint Chief Complaint  Patient presents with  . Emesis    HPI [name] is a 73 y.o. female.     The history is provided by the patient and a caregiver. No language interpreter was used.  Emesis  Severity:  Moderate Timing:  Constant Number of daily episodes:  Multiple Progression:  Worsening Chronicity:  New Recent urination:  Normal Relieved by:  Nothing Worsened by:  Nothing Ineffective treatments:  None tried Associated symptoms: cough   Pt is a poor historian.  History per pt's son [name].  He reports pt has had a cough, vomiting and diarrhea.  Pt weak from diarrhea.  Pt more short of breath than usual.  Pt's husband unable to get pt up today.  Son brought pt to ED.  He does have covid 19 concerns for pt.  Pt has a histroy of COPD and Pulmonary htn.   Past Medical History:  Diagnosis Date  . Arthritis   . Chronic back pain   . GERD (gastroesophageal reflux disease)   . Hypertension   . Thyroid disease   . Ulcer    Billroth I    Patient Active Problem List   Diagnosis Date Noted  . Abdominal pain, other specified site 08/25/2012  . Loss of weight 08/25/2012    Past Surgical History:  Procedure Laterality Date  . ABDOMINAL HYSTERECTOMY    . BACK SURGERY     X4  . BILROTH I PROCEDURE    . CHOLECYSTECTOMY    . COLONOSCOPY   05/18/2003   [id number] colonoscopy/ Internal hemorrhoids.  Otherwise, normal rectum  . COLONOSCOPY  08/12/2010   Dr. Arnoldo Morale: cecum visualized and normal, colon and rectum normal. Torturous colon  . COLONOSCOPY N/A 09/07/2012   RMR: tubular adenoma, lipoma. Due for surveillance in 2021  . ESOPHAGOGASTRODUODENOSCOPY  05/18/2003   RMR:. Normal esophagus/Adenomatous-appearing mucosa at the anastomosis  . ESOPHAGOGASTRODUODENOSCOPY  08/12/2010   Dr. Arnoldo Morale: anastomosis widely patent,  no ulcerations, CLO test negative  . RIGHT HEART CATH N/A 05/04/2018   Procedure: RIGHT HEART CATH;  Surgeon: Larey Dresser, MD;  Location: [place] [facility];  Service: Cardiovascular;  Laterality: N/A;  . STOMACH SURGERY     removed partial stomach  . YAG LASER APPLICATION Left 99/83/3825   Procedure: YAG LASER APPLICATION;  Surgeon: [name], MD;  Location: [facility];  Service: Ophthalmology;  Laterality: Left;  . YAG LASER APPLICATION Right 05/39/7673   Procedure: YAG LASER APPLICATION;  Surgeon: [name], MD;  Location: [facility];  Service: Ophthalmology;  Laterality: Right;     OB History   No obstetric history on file.      Home Medications    Prior to Admission medications   Medication Sig Start Date End Date Taking? Authorizing Provider  clonazePAM (KLONOPIN) 1 MG tablet Take 1 mg by mouth 4 (four) times daily as needed (For nerves).     [provider]  escitalopram (LEXAPRO) 10 MG tablet Take 1 tablet by mouth daily. 07/15/18   [provider]  furosemide (LASIX) 40 MG tablet Take 0.5 tablets (20 mg total) by mouth daily. 05/19/18   Larey Dresser, MD  gabapentin (NEURONTIN) 300 MG capsule Take 300 mg by mouth 3 (three) times daily.    [provider]  HYDROcodone-acetaminophen (NORCO) 10-325 MG tablet Take 1 tablet by mouth  every 4 (four) hours as needed (pain).  01/22/17   [provider]  ibuprofen (ADVIL,MOTRIN) 200 MG tablet Take 400 mg by mouth 2 (two) times daily as needed (pain).    [provider]  macitentan (OPSUMIT) 10 MG tablet Take 1 tablet (10 mg total) by mouth daily. 05/19/18   Larey Dresser, MD  MAGNESIUM PO Take 1 tablet by mouth daily.    [provider]  pantoprazole (PROTONIX) 40 MG tablet Take 40 mg by mouth at bedtime.  08/24/12   [provider]  spironolactone (ALDACTONE) 25 MG tablet Take 25 mg by mouth daily. 04/08/18   [provider]    Family History Family History   Problem Relation Age of Onset  . Cancer Sister   . Cancer Brother   . Cancer Maternal Aunt   . Colon cancer Neg Hx     Social History Social History   Tobacco Use  . Smoking status: Current Every Day Smoker    Packs/day: 0.75    Years: 15.00    Pack years: 11.25    Types: Cigarettes  . Smokeless tobacco: Never Used  Substance Use Topics  . Alcohol use: No  . Drug use: No     Allergies   Chantix [varenicline]; Codeine; and Oxycodone   Review of Systems Review of Systems  Respiratory: Positive for cough.   Gastrointestinal: Positive for vomiting.  All other systems reviewed and are negative.    Physical Exam Updated Vital Signs BP 131/62   Pulse 76   Temp 98.4 F (36.9 C) (Oral)   Resp 16   Ht _0  (1.676 m)   Wt 63.5 kg   SpO2 93%   BMI 22.60 kg/m   Physical Exam Vitals signs and nursing note reviewed.  Constitutional:      General: She is not in acute distress.    Appearance: She is well-developed.  HENT:     Head: Normocephalic and atraumatic.     Right Ear: External ear normal.     Left Ear: External ear normal.     Nose: Nose normal.     Mouth/Throat:     Mouth: Mucous membranes are moist.  Eyes:     Conjunctiva/sclera: Conjunctivae normal.  Neck:     Musculoskeletal: Normal range of motion and neck supple.  Cardiovascular:     Rate and Rhythm: Normal rate and regular rhythm.     Heart sounds: No murmur.  Pulmonary:     Effort: Pulmonary effort is normal. No respiratory distress.     Breath sounds: Normal breath sounds.  Abdominal:     Palpations: Abdomen is soft.     Tenderness: There is no abdominal tenderness.  Musculoskeletal: Normal range of motion.  Skin:    General: Skin is warm and dry.  Neurological:     General: No focal deficit present.     Mental Status: She is alert.  Psychiatric:        Mood and Affect: Mood normal.      ED Treatments / Results  Labs (all labs ordered are listed, but only abnormal results are  displayed) Labs Reviewed  CBC WITH DIFFERENTIAL/PLATELET - Abnormal; Notable for the following components:      Result Value   RBC 6.22 (*)    HCT 49.0 (*)    MCV 78.8 (*)    MCH 23.5 (*)    MCHC 29.8 (*)    RDW 19.5 (*)    Platelets 129 (*)  nRBC 1.4 (*)    Lymphs Abs 0.5 (*)    All other components within normal limits  COMPREHENSIVE METABOLIC PANEL - Abnormal; Notable for the following components:   Sodium 128 (*)    Chloride 88 (*)    All other components within normal limits  URINALYSIS, ROUTINE W REFLEX MICROSCOPIC - Abnormal; Notable for the following components:   pH 9.0 (*)    Ketones, ur 20 (*)    All other components within normal limits  TROPONIN I    EKG EKG Interpretation  Date/Time:  Tuesday July 26 2018 10:42:37 EDT Ventricular Rate:  68 PR Interval:    QRS Duration: 89 QT Interval:  436 QTC Calculation: 464 R Axis:   109 Text Interpretation:  Sinus rhythm Probable left atrial enlargement Lateral infarct, old Abnrm T, consider ischemia, anterolateral lds Confirmed by Aletta Edouard [phone]) on 07/26/2018 10:45:02 AM   Radiology Dg Chest Port 1 View  Result Date: 07/26/2018 CLINICAL DATA:  Cough. EXAM: PORTABLE CHEST 1 VIEW COMPARISON:  Radiographs of May 12, 2018. FINDINGS: Stable cardiomegaly. Atherosclerosis of thoracic aorta is noted. No pneumothorax or pleural effusion is noted. No acute pulmonary disease is noted. Bony thorax is unremarkable. IMPRESSION: No acute cardiopulmonary abnormality seen. Aortic Atherosclerosis (ICD10-I70.0). Electronically Signed   By: Marijo Conception M.D.   On: 07/26/2018 12:39    Procedures Procedures (including critical care time)  Medications Ordered in ED Medications  sodium chloride 0.9 % bolus 1,000 mL (0 mLs Intravenous Stopped 07/26/18 1200)  ondansetron (ZOFRAN) injection 4 mg (4 mg Intravenous Given 07/26/18 1157)  albuterol (VENTOLIN HFA) 108 (90 Base) MCG/ACT inhaler 2 puff (2 puffs Inhalation Given  07/26/18 1321)     Initial Impression / Assessment and Plan / ED Course  I have reviewed the triage vital signs and the nursing notes.  Pertinent labs & imaging results that were available during my care of the patient were reviewed by me and considered in my medical decision making (see chart for details).  Clinical Course as of Jul 26 1430  Tue Jul 26, 2531  [id number] 73 year old female here with vomiting.  No other complaints.  No reported fever.  Her lab work is fairly unremarkable other than a low sodium of 128.  She looks a little dry with ketones in her urine.  She is got some IV fluids here.  We will p.o. trial and see how she is doing.   [MB]    Clinical Course User Index [MB] Hayden Rasmussen, MD      Pt had low 02 sats in low 80's  Pt given IV fluids.  Pt continues to complain of weakness,  Chest xray normal.  Pt has a histoy of decreased 02 sats when last seen by Cardiology.  Pt's placed on 2 liters 02 here and Sats are 88-91.  Pt given albuterol inhaler with minimal relief.  I took pt off 02 and sats drop to low 80's.   I suspect exacerbation of COPD.   I doubt chf.   I spoke to hospitalist Dr. Denton Brick.  She request Covid testing prior to admitting.  She will admit here if negative, transfer if positive.  Final Clinical Impressions(s) / ED Diagnoses   Final diagnoses:  Shortness of breath  Chronic obstructive pulmonary disease, unspecified COPD type [facility])    ED Discharge Orders    None       [name] 07/26/18 1554    Hayden Rasmussen, MD 07/28/18 1500

## 2018-07-26 NOTE — Progress Notes (Addendum)
Called to admit 73 Y O with hx of COPD, Pulmonary HTN, who presented with diarrhea and vomiting, cough,  hypoxic in Ed to 78% not on home O2, chest xray clear. Apparently per patients Son, patient has not been following lockdown restrictions. Will need to rule out COVID-19. Patient at this time not ready for admission.  EDP to call back with results, and initiate droplet and contact precautions.  Verlon Pischke. E. TRH. 3:38 PM 07/26/18   LOS- NO CHARGE.

## 2018-07-26 NOTE — H&P (Addendum)
History and Physical    AUGUSTA MIRKIN YBO:[id number] DOB: [birth year] DOA: 07/26/2018  PCP: Lemmie Evens, MD   Patient coming from: home  I have personally briefly reviewed patient's old medical records in [place]  Chief Complaint: Vomiting, cough  HPI: [name] is a 73 y.o. female with medical history significant for, COPD hypertension and pulmonary hypertension, presented to the ED complaining of vomiting and cough.  Poor historian, unable to give me full details and changes aspects of the initial presenting complaints.  Reports cough that started yesterday-productive of whitish phlegm, earlier reported some increase in her difficulty breathing but to me she denies any difficulty breathing to me.  She also reports 1 episode of vomiting this morning only.  She denies abdominal pain.  She denies loose stools to me, but came in with complaints of diarrhea.  She denies sick contacts, she denies flulike symptoms, fever or chills. She reports she has been following lockdown restrictions but patient son- Christia Reading denies this, and is concerned about his mother having COVID19 infection. Patient tells me in the recent past her oxygen levels would drop, both would go back to normal.  ED Course: Blood pressure systolic 277- 824M.  O2 sats initially 93% dropped to 78% on room air.  WBC 4.2, hemoglobin stable 14.6, mildly low platelets 129, sodium low 128, unremarkable troponin.  SARS COVID-19 test negative.  Portable chest x-ray negative for acute abnormality.  1 Liter bolus given as patient appeared weak likely from diarrhea.  Albuterol inhaler given.  Considering hypoxia, hospitalist called to admit for COPD exacerbation.  Review of Systems: As per HPI all other systems reviewed and negative.  Past Medical History:  Diagnosis Date  . Arthritis   . Chronic back pain   . GERD (gastroesophageal reflux disease)   . Hypertension   . Thyroid disease   . Ulcer     [place] I    Past Surgical History:  Procedure Laterality Date  . ABDOMINAL HYSTERECTOMY    . BACK SURGERY     X4  . BILROTH I PROCEDURE    . CHOLECYSTECTOMY    . COLONOSCOPY   05/18/2003   PNT:[id number] colonoscopy/ Internal hemorrhoids.  Otherwise, normal rectum  . COLONOSCOPY  08/12/2010   Dr. Arnoldo Morale: cecum visualized and normal, colon and rectum normal. Torturous colon  . COLONOSCOPY N/A 09/07/2012   RMR: tubular adenoma, lipoma. Due for surveillance in 2021  . ESOPHAGOGASTRODUODENOSCOPY  05/18/2003   RMR:. Normal esophagus/Adenomatous-appearing mucosa at the anastomosis  . ESOPHAGOGASTRODUODENOSCOPY  08/12/2010   Dr. Arnoldo Morale: anastomosis widely patent, no ulcerations, CLO test negative  . RIGHT HEART CATH N/A 05/04/2018   Procedure: RIGHT HEART CATH;  Surgeon: Larey Dresser, MD;  Location: [facility];  Service: Cardiovascular;  Laterality: N/A;  . STOMACH SURGERY     removed partial stomach  . YAG LASER APPLICATION Left 86/76/1950   Procedure: YAG LASER APPLICATION;  Surgeon: [name], MD;  Location: [facility];  Service: Ophthalmology;  Laterality: Left;  . YAG LASER APPLICATION Right 93/26/7124   Procedure: YAG LASER APPLICATION;  Surgeon: [name], MD;  Location: [facility];  Service: Ophthalmology;  Laterality: Right;     reports that she has been smoking cigarettes. She has a 11.25 pack-year smoking history. She has never used smokeless tobacco. She reports that she does not drink alcohol or use drugs.  Allergies  Allergen Reactions  . Chantix [Varenicline] Nausea And Vomiting  . Codeine Other (See Comments)  hallucinations  . Oxycodone Palpitations    Family History  Problem Relation Age of Onset  . Cancer Sister   . Cancer Brother   . Cancer Maternal Aunt   . Colon cancer Neg Hx     Prior to Admission medications   Medication Sig Start Date End Date Taking? Authorizing Provider  clonazePAM (KLONOPIN) 1 MG tablet Take 1 mg by mouth 4 (four)  times daily as needed (For nerves).    Yes [provider]  escitalopram (LEXAPRO) 10 MG tablet Take 1 tablet by mouth daily. 07/15/18  Yes [provider]  furosemide (LASIX) 40 MG tablet Take 0.5 tablets (20 mg total) by mouth daily. 05/19/18  Yes Larey Dresser, MD  gabapentin (NEURONTIN) 300 MG capsule Take 300 mg by mouth 3 (three) times daily.   Yes [provider]  HYDROcodone-acetaminophen (NORCO) 10-325 MG tablet Take 1 tablet by mouth every 4 (four) hours as needed (pain).  01/22/17  Yes [provider]  macitentan (OPSUMIT) 10 MG tablet Take 1 tablet (10 mg total) by mouth daily. 05/19/18  Yes Larey Dresser, MD  MAGNESIUM PO Take 1 tablet by mouth daily.   Yes [provider]  pantoprazole (PROTONIX) 40 MG tablet Take 40 mg by mouth at bedtime.  08/24/12  Yes [provider]  spironolactone (ALDACTONE) 25 MG tablet Take 25 mg by mouth daily. 04/08/18  Yes [provider]  ibuprofen (ADVIL,MOTRIN) 200 MG tablet Take 400 mg by mouth 2 (two) times daily as needed (pain).    [provider]    Physical Exam: Vitals:   07/26/18 1400 07/26/18 1500 07/26/18 1600 07/26/18 1700  BP: (!) 127/58 (!) 127/56 131/77 (!) 106/42  Pulse: 66 68 68 (!) 59  Resp: _0 Temp:      TempSrc:      SpO2: (!) 89% 93% 97% 96%  Weight:      Height:        Constitutional: NAD, calm, comfortable Vitals:   07/26/18 1400 07/26/18 1500 07/26/18 1600 07/26/18 1700  BP: (!) 127/58 (!) 127/56 131/77 (!) 106/42  Pulse: 66 68 68 (!) 59  Resp: _1 Temp:      TempSrc:      SpO2: (!) 89% 93% 97% 96%  Weight:      Height:       Eyes: PERRL, lids and conjunctivae normal ENMT: Mucous membranes are moist. Posterior pharynx clear of any exudate or lesions. Neck: normal, supple, no masses, no thyromegaly Respiratory: Diffuse expiratory wheezing, Normal respiratory effort. No accessory muscle use.  Cardiovascular: Regular rate  and rhythm, No extremity edema. 2+ pedal pulses. Abdomen: no tenderness, no masses palpated. No hepatosplenomegaly. Bowel sounds positive.  Musculoskeletal: no clubbing / cyanosis. No joint deformity upper and lower extremities. Good ROM, no contractures.  Skin: no rashes, lesions, ulcers. No induration Neurologic: CN 2-12 grossly intact.  Strength 5/5 in all 4.  Psychiatric: Normal judgment and insight. Alert and oriented x 3. Normal mood.   Labs on Admission: I have personally reviewed following labs and imaging studies  CBC: Recent Labs  Lab 07/26/18 1028  WBC 4.2  NEUTROABS 3.3  HGB 14.6  HCT 49.0*  MCV 78.8*  PLT 283*   Basic Metabolic Panel: Recent Labs  Lab 07/26/18 1028  NA 128*  K 3.6  CL 88*  CO2 28  GLUCOSE 90  BUN 13  CREATININE 0.63  CALCIUM 8.9   Liver Function  Tests: Recent Labs  Lab 07/26/18 1028  AST 16  ALT 15  ALKPHOS 80  BILITOT 0.8  PROT 6.6  ALBUMIN 3.5   Cardiac Enzymes: Recent Labs  Lab 07/26/18 1028  TROPONINI <0.03   Urine analysis:    Component Value Date/Time   COLORURINE YELLOW 07/26/2018 1003   APPEARANCEUR CLEAR 07/26/2018 1003   LABSPEC 1.013 07/26/2018 1003   PHURINE 9.0 (H) 07/26/2018 1003   GLUCOSEU NEGATIVE 07/26/2018 1003   HGBUR NEGATIVE 07/26/2018 1003   BILIRUBINUR NEGATIVE 07/26/2018 1003   KETONESUR 20 (A) 07/26/2018 1003   PROTEINUR NEGATIVE 07/26/2018 1003   UROBILINOGEN 0.2 09/15/2012 0740   NITRITE NEGATIVE 07/26/2018 1003   LEUKOCYTESUR NEGATIVE 07/26/2018 1003    Radiological Exams on Admission: Dg Chest Port 1 View  Result Date: 07/26/2018 CLINICAL DATA:  Cough. EXAM: PORTABLE CHEST 1 VIEW COMPARISON:  Radiographs of May 12, 2018. FINDINGS: Stable cardiomegaly. Atherosclerosis of thoracic aorta is noted. No pneumothorax or pleural effusion is noted. No acute pulmonary disease is noted. Bony thorax is unremarkable. IMPRESSION: No acute cardiopulmonary abnormality seen. Aortic Atherosclerosis  (ICD10-I70.0). Electronically Signed   By: Marijo Conception M.D.   On: 07/26/2018 12:39    EKG: Independently reviewed.  Sinus rhythm, QTC 464.  T wave abnormalities in V2 V3 aVL are old.  Assessment/Plan Active Problems:   COPD exacerbation (HCC)    COPD exacerbation- with productive cough, reported difficulty breathing, expiratory wheezing on exam.  At the time of my evaluation O2 sats 92% on room air would drop to 85% while talking or with activity.  Improved with 2 L O2, sats > 92%.  Chest x-ray clear, SARS COVID 19 test negative.  Possible component of pulmonary hypertension contributing to hypoxia. -PRN scheduled albuterol/Combivent inhaler -IV Solu-Medrol 60 twice daily -Manager consult, patient may need chronic home O2  Pulmonary hypertension-f/w Dr. Aundra Dubin.  Recent echo 1/3/ 2020 showed severe RV dilatation and dysfunction, PA pressure of 76, 60 to 65%, subsequent right heart cath 05/04/2018-moderate pulmonary artery hypertension, with PA 55/21 mean of 36. On last clinic visit 05/2018-6-minute walk, O2 sats dropped to 84% transiently but generally was greater than 88%. - Resume home OPSUMITshe tells me she has not taking any of her medications in 2 days and thinks this is why her symptoms started.  Chronic diastolic CHF-translated.  Reports some vomiting today and loose stools.  Echo 04/08/2018, EF 60 to 65%, mild LVH. -Hold home Lasix 38mlligrams daily and spironolactone - Gentle fluids  Vomiting, loose stools-none since episodes this morning. 1L bolus fluid given in ED.  Possible gastroenteritis.  She denies loose stools to me.  Reports minimal intake in the past 3 days.  COVID negative. - gentle fluids- N/s 75cc X hr x 12 hrs  Hyponatremia-128, baseline 131-134.  Likely from vomiting and loose stools, also on Lasix.  1 Liter bolus given - BMP a.m - gentle fluids- N/s 75cc X hr x 12 hrs  Anxiety, -she takes clonazepam as needed - 115mclonazepam qhs PRN  DVT prophylaxis:  Lovenox Code Status: Full Family Communication: None at bedside Disposition Plan: per rounding team Consults called: None Admission status: Obs, Medsurg  EjBethena RoysD [facility]  07/26/2018, 9:31 PM

## 2018-07-26 NOTE — ED Notes (Signed)
Pt ambulatory to restroom with oxygen at 3lpm via [place] with no distress noted,

## 2018-07-26 NOTE — Progress Notes (Signed)
Consult request has been received. CSW attempting to follow up at present time  [place] [phone]

## 2018-07-26 NOTE — ED Notes (Signed)
RT notified for incentive spirometer.

## 2018-07-26 NOTE — ED Notes (Signed)
Pt c/o being uncomfortable on the stretcher, back and legs being "sore" , pt repositioned, given tylenol per prn orders and updated on plan of care,

## 2018-07-26 NOTE — ED Triage Notes (Signed)
Vomited twice this am.  Denies any pain at this time.  Have not be around anyone that has been sick.

## 2018-07-27 DIAGNOSIS — J441 Chronic obstructive pulmonary disease with (acute) exacerbation: Secondary | ICD-10-CM | POA: Diagnosis not present

## 2018-07-27 LAB — BASIC METABOLIC PANEL
Anion gap: 10 (ref 5–15)
BUN: 13 mg/dL (ref 8–23)
CO2: 26 mmol/L (ref 22–32)
Calcium: 8.7 mg/dL — ABNORMAL LOW (ref 8.9–10.3)
Chloride: 93 mmol/L — ABNORMAL LOW (ref 98–111)
Creatinine, Ser: 0.63 mg/dL (ref 0.44–1.00)
GFR calc Af Amer: >60 mL/min (ref >60)
GFR calc non Af Amer: >60 mL/min (ref >60)
Glucose, Bld: 107 mg/dL — ABNORMAL HIGH (ref 70–99)
Potassium: 4 mmol/L (ref 3.5–5.1)
Sodium: 129 mmol/L — ABNORMAL LOW (ref 135–145)

## 2018-07-27 MED ORDER — FUROSEMIDE 40 MG PO TABS: 20.0000 mg | ORAL_TABLET | Freq: Every day | ORAL | 3 refills | Status: DC

## 2018-07-27 MED ORDER — ALBUTEROL SULFATE HFA 108 (90 BASE) MCG/ACT IN AERS: 2.0000 | INHALATION_SPRAY | RESPIRATORY_TRACT | 1 refills | Status: DC | PRN

## 2018-07-27 MED ORDER — PREDNISONE 20 MG PO TABS: ORAL_TABLET | ORAL | 0 refills | Status: DC

## 2018-07-27 MED ORDER — SPIRONOLACTONE 25 MG PO TABS: 25.0000 mg | ORAL_TABLET | Freq: Every day | ORAL | Status: DC

## 2018-07-27 MED ORDER — DOXYCYCLINE HYCLATE 100 MG PO CAPS: 100.0000 mg | ORAL_CAPSULE | Freq: Two times a day (BID) | ORAL | 0 refills | Status: AC

## 2018-07-27 NOTE — Discharge Summary (Signed)
Physician Discharge Summary  MALEIGHA COLVARD HYI:[id number] DOB: [birth year] DOA: 07/26/2018  PCP: Lemmie Evens, MD  Admit date: 07/26/2018 Discharge date: 07/27/2018  Admitted From:  Home  Disposition: Home   Recommendations for Outpatient Follow-up:  1. Follow up with PCP in 1 weeks  Home Health:  RN  Discharge Condition: STABLE  CODE STATUS: FULL    Brief Hospitalization Summary: Please see all hospital notes, images, labs for full details of the hospitalization. Dr. Talmadge Coventry HPI: [name] is a 73 y.o. female with medical history significant for, COPD hypertension and pulmonary hypertension, presented to the ED complaining of vomiting and cough.  Poor historian, unable to give me full details and changes aspects of the initial presenting complaints.  Reports cough that started yesterday-productive of whitish phlegm, earlier reported some increase in her difficulty breathing but to me she denies any difficulty breathing to me.  She also reports 1 episode of vomiting this morning only.  She denies abdominal pain.  She denies loose stools to me, but came in with complaints of diarrhea.  She denies sick contacts, she denies flulike symptoms, fever or chills. She reports she has been following lockdown restrictions but patient son- Christia Reading denies this, and is concerned about his mother having COVID19 infection. Patient tells me in the recent past her oxygen levels would drop, both would go back to normal.  ED Course: Blood pressure systolic 786- 767M.  O2 sats initially 93% dropped to 78% on room air.  WBC 4.2, hemoglobin stable 14.6, mildly low platelets 129, sodium low 128, unremarkable troponin.  SARS COVID-19 test negative.  Portable chest x-ray negative for acute abnormality.  1 Liter bolus given as patient appeared weak likely from diarrhea.  Albuterol inhaler given.  Considering hypoxia, hospitalist called to admit for COPD exacerbation.  Hospital course: The  patient was admitted for mild COPD exacerbation, shortness of breath and emesis.  Her vomiting has resolved and she has been tolerating diet so far.  Her COVID testing has been negative.  She was gently hydrated with IV fluids and reports that she is feeling better.  She was noted to have hyponatremia on admission and was treated with IV fluids and temporarily holding diuretics.  She says that she is feeling much better this morning and would like to go home.  We have made an attempt to wean her oxygen and will have her tested to see if she qualifies for home oxygen.  If she does she will be sent home with home oxygen.  The patient is not very interested in home oxygen at this time however I did counsel her that it could be beneficial for her and she verbalized understanding.  I also counseled her about smoking cessation and strongly advise her to stop all tobacco use.  She verbalized understanding and said that she would try.  She will be discharged on a prednisone taper and albuterol inhaler.  Follow-up recommended with primary care physician.  She also is being treated for pulmonary hypertension and was strongly advised to follow-up with her cardiologist Dr. Aundra Dubin regarding this.  She verbalized understanding.  Discharge Diagnoses:  Active Problems:   COPD exacerbation [facility])   Discharge Instructions:  Allergies as of 07/27/2018      Reactions   Chantix [varenicline] Nausea And Vomiting   Codeine Other (See Comments)   hallucinations   Oxycodone Palpitations      Medication List    TAKE these medications   albuterol 108 (90 Base) MCG/ACT inhaler  Commonly known as:  VENTOLIN HFA Inhale 2 puffs into the lungs every 4 (four) hours as needed for wheezing or shortness of breath.   clonazePAM 1 MG tablet Commonly known as:  KLONOPIN Take 1 mg by mouth 4 (four) times daily as needed (For nerves).   doxycycline 100 MG capsule Commonly known as:  VIBRAMYCIN Take 1 capsule (100 mg total) by  mouth 2 (two) times daily for 3 days.   escitalopram 10 MG tablet Commonly known as:  LEXAPRO Take 1 tablet by mouth daily.   furosemide 40 MG tablet Commonly known as:  LASIX Take 0.5 tablets (20 mg total) by mouth daily. Start taking on:  July 31, 2018 What changed:  These instructions start on July 31, 2018. If you are unsure what to do until then, ask your doctor or other care provider.   gabapentin 300 MG capsule Commonly known as:  NEURONTIN Take 300 mg by mouth 3 (three) times daily.   HYDROcodone-acetaminophen 10-325 MG tablet Commonly known as:  NORCO Take 1 tablet by mouth every 4 (four) hours as needed (pain).   ibuprofen 200 MG tablet Commonly known as:  ADVIL Take 400 mg by mouth 2 (two) times daily as needed (pain).   macitentan 10 MG tablet Commonly known as:  Opsumit Take 1 tablet (10 mg total) by mouth daily.   MAGNESIUM PO Take 1 tablet by mouth daily.   pantoprazole 40 MG tablet Commonly known as:  PROTONIX Take 40 mg by mouth at bedtime.   predniSONE 20 MG tablet Commonly known as:  DELTASONE Take 3 PO QAM x3days, 2 PO QAM x3days, 1 PO QAM x3days Start taking on:  July 28, 2018   spironolactone 25 MG tablet Commonly known as:  ALDACTONE Take 1 tablet (25 mg total) by mouth daily. Start taking on:  July 31, 2018 What changed:  These instructions start on July 31, 2018. If you are unsure what to do until then, ask your doctor or other care provider.            Durable Medical Equipment  (From admission, onward)         Start     Ordered   07/27/18 0958  For home use only DME oxygen  Once    Question Answer Comment  Mode or (Route) Nasal cannula   Liters per Minute 2   Oxygen delivery system Gas      07/27/18 0957         Follow-up Information    Lemmie Evens, MD. Schedule an appointment as soon as possible for a visit in 1 week(s).   Specialty:  Family Medicine Why:  Hospital follow-up Contact information: [place] [address] [phone]        Fay Records, MD .   Specialty:  Cardiology Contact information: [address] [place] [address] [phone]          Allergies  Allergen Reactions  . Chantix [Varenicline] Nausea And Vomiting  . Codeine Other (See Comments)    hallucinations  . Oxycodone Palpitations   Allergies as of 07/27/2018      Reactions   Chantix [varenicline] Nausea And Vomiting   Codeine Other (See Comments)   hallucinations   Oxycodone Palpitations      Medication List    TAKE these medications   albuterol 108 (90 Base) MCG/ACT inhaler Commonly known as:  VENTOLIN HFA Inhale 2 puffs into the lungs every 4 (four) hours as  needed for wheezing or shortness of breath.   clonazePAM 1 MG tablet Commonly known as:  KLONOPIN Take 1 mg by mouth 4 (four) times daily as needed (For nerves).   doxycycline 100 MG capsule Commonly known as:  VIBRAMYCIN Take 1 capsule (100 mg total) by mouth 2 (two) times daily for 3 days.   escitalopram 10 MG tablet Commonly known as:  LEXAPRO Take 1 tablet by mouth daily.   furosemide 40 MG tablet Commonly known as:  LASIX Take 0.5 tablets (20 mg total) by mouth daily. Start taking on:  July 31, 2018 What changed:  These instructions start on July 31, 2018. If you are unsure what to do until then, ask your doctor or other care provider.   gabapentin 300 MG capsule Commonly known as:  NEURONTIN Take 300 mg by mouth 3 (three) times daily.   HYDROcodone-acetaminophen 10-325 MG tablet Commonly known as:  NORCO Take 1 tablet by mouth every 4 (four) hours as needed (pain).   ibuprofen 200 MG tablet Commonly known as:  ADVIL Take 400 mg by mouth 2 (two) times daily as needed (pain).   macitentan 10 MG tablet Commonly known as:  Opsumit Take 1 tablet (10 mg total) by mouth daily.   MAGNESIUM PO Take 1 tablet by mouth daily.   pantoprazole 40 MG tablet Commonly known as:   PROTONIX Take 40 mg by mouth at bedtime.   predniSONE 20 MG tablet Commonly known as:  DELTASONE Take 3 PO QAM x3days, 2 PO QAM x3days, 1 PO QAM x3days Start taking on:  July 28, 2018   spironolactone 25 MG tablet Commonly known as:  ALDACTONE Take 1 tablet (25 mg total) by mouth daily. Start taking on:  July 31, 2018 What changed:  These instructions start on July 31, 2018. If you are unsure what to do until then, ask your doctor or other care provider.            Durable Medical Equipment  (From admission, onward)         Start     Ordered   07/27/18 0958  For home use only DME oxygen  Once    Question Answer Comment  Mode or (Route) Nasal cannula   Liters per Minute 2   Oxygen delivery system Gas      07/27/18 0957          Procedures/Studies: Dg Chest Port 1 View  Result Date: 07/26/2018 CLINICAL DATA:  Cough. EXAM: PORTABLE CHEST 1 VIEW COMPARISON:  Radiographs of May 12, 2018. FINDINGS: Stable cardiomegaly. Atherosclerosis of thoracic aorta is noted. No pneumothorax or pleural effusion is noted. No acute pulmonary disease is noted. Bony thorax is unremarkable. IMPRESSION: No acute cardiopulmonary abnormality seen. Aortic Atherosclerosis (ICD10-I70.0). Electronically Signed   By: Marijo Conception M.D.   On: 07/26/2018 12:39      Subjective: Patient reports that she is feeling much better.  She denies having shortness of breath.  She denies chest pain.  She denies cough.  She reports that she wants to go home.  Her oxygen came off while she was sleeping at night and she was asymptomatic from having it off.  She is not interested in home oxygen but is willing to be tested and after being counseled said that she would wear it if she was having symptoms of shortness of breath.  Discharge Exam: Vitals:   07/27/18 0537 07/27/18 0820  BP:    Pulse: 68   Resp:  Temp:    SpO2: 90% 93%   Vitals:   07/26/18 2106 07/27/18 0535 07/27/18 0537 07/27/18 0820   BP: (!) 119/58 103/62    Pulse: (!) 56 72 68   Resp: 16 16    Temp: 98.5 F (36.9 C) 98.4 F (36.9 C)    TempSrc: Oral Oral    SpO2: 96% (!) 87% 90% 93%  Weight:      Height:       General: Pt is alert, awake, not in acute distress Cardiovascular: RRR, S1/S2 +, no rubs, no gallops Respiratory: Good air movement with expiratory wheezes heard no wheezing, no rhonchi Abdominal: Soft, NT, ND, bowel sounds + Extremities: no edema, no cyanosis   The results of significant diagnostics from this hospitalization (including imaging, microbiology, ancillary and laboratory) are listed below for reference.     Microbiology: Recent Results (from the past 240 hour(s))  SARS Coronavirus 2 [facility] order, Performed in [place] hospital lab)     Status: None   Collection Time: 07/26/18  4:08 PM  Result Value Ref Range Status   SARS Coronavirus 2 NEGATIVE NEGATIVE Final    Comment: (NOTE) If result is NEGATIVE SARS-CoV-2 target nucleic acids are NOT DETECTED. The SARS-CoV-2 RNA is generally detectable in upper and lower  respiratory specimens during the acute phase of infection. The lowest  concentration of SARS-CoV-2 viral copies this assay can detect is 250  copies / mL. A negative result does not preclude SARS-CoV-2 infection  and should not be used as the sole basis for treatment or other  patient management decisions.  A negative result may occur with  improper specimen collection / handling, submission of specimen other  than nasopharyngeal swab, presence of viral mutation(s) within the  areas targeted by this assay, and inadequate number of viral copies  (<250 copies / mL). A negative result must be combined with clinical  observations, patient history, and epidemiological information. If result is POSITIVE SARS-CoV-2 target nucleic acids are DETECTED. The SARS-CoV-2 RNA is generally detectable in upper and lower  respiratory specimens dur ing the acute phase of infection.   Positive  results are indicative of active infection with SARS-CoV-2.  Clinical  correlation with patient history and other diagnostic information is  necessary to determine patient infection status.  Positive results do  not rule out bacterial infection or co-infection with other viruses. If result is PRESUMPTIVE POSTIVE SARS-CoV-2 nucleic acids MAY BE PRESENT.   A presumptive positive result was obtained on the submitted specimen  and confirmed on repeat testing.  While 2019 novel coronavirus  (SARS-CoV-2) nucleic acids may be present in the submitted sample  additional confirmatory testing may be necessary for epidemiological  and / or clinical management purposes  to differentiate between  SARS-CoV-2 and other Sarbecovirus currently known to infect humans.  If clinically indicated additional testing with an alternate test  methodology [phone]) is advised. The SARS-CoV-2 RNA is generally  detectable in upper and lower respiratory sp ecimens during the acute  phase of infection. The expected result is Negative. Fact Sheet for Patients:  StrictlyIdeas.no Fact Sheet for Healthcare Providers: BankingDealers.co.za This test is not yet approved or cleared by the [place] FDA and has been authorized for detection and/or diagnosis of SARS-CoV-2 by FDA under an Emergency Use Authorization (EUA).  This EUA will remain in effect (meaning this test can be used) for the duration of the COVID-19 declaration under Section 564(b)(1) of the Act, 21 U.S.C. section 360bbb-3(b)(1), unless the authorization  is terminated or revoked sooner. Performed at [facility], [address] [place], [place] [address]      Labs: BNP (last 3 results) Recent Labs    03/07/18 1551 04/07/18 1030  BNP 755.0* 638.9*   Basic Metabolic Panel: Recent Labs  Lab 07/26/18 1028 07/27/18 0624  NA 128* 129*  K 3.6 4.0  CL 88* 93*  CO2 28 26  GLUCOSE 90 107*   BUN 13 13  CREATININE 0.63 0.63  CALCIUM 8.9 8.7*   Liver Function Tests: Recent Labs  Lab 07/26/18 1028  AST 16  ALT 15  ALKPHOS 80  BILITOT 0.8  PROT 6.6  ALBUMIN 3.5   No results for input(s): LIPASE, AMYLASE in the last 168 hours. No results for input(s): AMMONIA in the last 168 hours. CBC: Recent Labs  Lab 07/26/18 1028  WBC 4.2  NEUTROABS 3.3  HGB 14.6  HCT 49.0*  MCV 78.8*  PLT 129*   Cardiac Enzymes: Recent Labs  Lab 07/26/18 1028  TROPONINI <0.03   BNP: Invalid input(s): POCBNP CBG: No results for input(s): GLUCAP in the last 168 hours. D-Dimer No results for input(s): DDIMER in the last 72 hours. Hgb A1c No results for input(s): HGBA1C in the last 72 hours. Lipid Profile No results for input(s): CHOL, HDL, LDLCALC, TRIG, CHOLHDL, LDLDIRECT in the last 72 hours. Thyroid function studies No results for input(s): TSH, T4TOTAL, T3FREE, THYROIDAB in the last 72 hours.  Invalid input(s): FREET3 Anemia work up No results for input(s): VITAMINB12, FOLATE, FERRITIN, TIBC, IRON, RETICCTPCT in the last 72 hours. Urinalysis    Component Value Date/Time   COLORURINE YELLOW 07/26/2018 1003   APPEARANCEUR CLEAR 07/26/2018 1003   LABSPEC 1.013 07/26/2018 1003   PHURINE 9.0 (H) 07/26/2018 1003   GLUCOSEU NEGATIVE 07/26/2018 1003   HGBUR NEGATIVE 07/26/2018 1003   BILIRUBINUR NEGATIVE 07/26/2018 1003   KETONESUR 20 (A) 07/26/2018 1003   PROTEINUR NEGATIVE 07/26/2018 1003   UROBILINOGEN 0.2 09/15/2012 0740   NITRITE NEGATIVE 07/26/2018 1003   LEUKOCYTESUR NEGATIVE 07/26/2018 1003   Sepsis Labs Invalid input(s): PROCALCITONIN,  WBC,  LACTICIDVEN Microbiology Recent Results (from the past 240 hour(s))  SARS Coronavirus 2 [facility] order, Performed in [place] hospital lab)     Status: None   Collection Time: 07/26/18  4:08 PM  Result Value Ref Range Status   SARS Coronavirus 2 NEGATIVE NEGATIVE Final    Comment: (NOTE) If result is  NEGATIVE SARS-CoV-2 target nucleic acids are NOT DETECTED. The SARS-CoV-2 RNA is generally detectable in upper and lower  respiratory specimens during the acute phase of infection. The lowest  concentration of SARS-CoV-2 viral copies this assay can detect is 250  copies / mL. A negative result does not preclude SARS-CoV-2 infection  and should not be used as the sole basis for treatment or other  patient management decisions.  A negative result may occur with  improper specimen collection / handling, submission of specimen other  than nasopharyngeal swab, presence of viral mutation(s) within the  areas targeted by this assay, and inadequate number of viral copies  (<250 copies / mL). A negative result must be combined with clinical  observations, patient history, and epidemiological information. If result is POSITIVE SARS-CoV-2 target nucleic acids are DETECTED. The SARS-CoV-2 RNA is generally detectable in upper and lower  respiratory specimens dur ing the acute phase of infection.  Positive  results are indicative of active infection with SARS-CoV-2.  Clinical  correlation with patient history and other diagnostic  information is  necessary to determine patient infection status.  Positive results do  not rule out bacterial infection or co-infection with other viruses. If result is PRESUMPTIVE POSTIVE SARS-CoV-2 nucleic acids MAY BE PRESENT.   A presumptive positive result was obtained on the submitted specimen  and confirmed on repeat testing.  While 2019 novel coronavirus  (SARS-CoV-2) nucleic acids may be present in the submitted sample  additional confirmatory testing may be necessary for epidemiological  and / or clinical management purposes  to differentiate between  SARS-CoV-2 and other Sarbecovirus currently known to infect humans.  If clinically indicated additional testing with an alternate test  methodology [phone]) is advised. The SARS-CoV-2 RNA is generally  detectable  in upper and lower respiratory sp ecimens during the acute  phase of infection. The expected result is Negative. Fact Sheet for Patients:  StrictlyIdeas.no Fact Sheet for Healthcare Providers: BankingDealers.co.za This test is not yet approved or cleared by the [place] FDA and has been authorized for detection and/or diagnosis of SARS-CoV-2 by FDA under an Emergency Use Authorization (EUA).  This EUA will remain in effect (meaning this test can be used) for the duration of the COVID-19 declaration under Section 564(b)(1) of the Act, 21 U.S.C. section 360bbb-3(b)(1), unless the authorization is terminated or revoked sooner. Performed at [facility], [address] [place], [place] [address]    Time coordinating discharge:   SIGNED:  Irwin Brakeman, MD  [facility] 07/27/2018, 10:23 AM How to contact the [facility] Attending or Consulting provider [place] or covering provider during after hours [place], for this patient?  1. Check the care team in [facility] and look for a) attending/consulting [facility] provider listed and b) the [facility] team listed 2. Log into [email or web address] and use [place]'s universal password to access. If you do not have the password, please contact the hospital operator. 3. Locate the [facility] provider you are looking for under [facility] and page to a number that you can be directly reached. 4. If you still have difficulty reaching the provider, please page the [facility] (Director on Call) for the Hospitalists listed on amion for assistance.

## 2018-07-27 NOTE — Progress Notes (Signed)
SATURATION QUALIFICATIONS: (This note is used to comply with regulatory documentation for home oxygen)  Patient Saturations on Room Air at Rest = 90%  Patient Saturations on Room Air while Ambulating = 86%  Patient Saturations on 2 Liters of oxygen while Ambulating = 98%

## 2018-07-27 NOTE — Progress Notes (Signed)
Patient O2 sat was found to be 85%. Patient has been educated about the importance on wearing oxygen at all times however is very agitated about wearing oxygen at all times. Patient agrees to put oxygen on and O2 sat comes up to 95% on 2L.

## 2018-07-27 NOTE — Discharge Instructions (Signed)
Please stop smoking.     COPD and Physical Activity Chronic obstructive pulmonary disease (COPD) is a long-term (chronic) condition that affects the lungs. COPD is a general term that can be used to describe many different lung problems that cause lung swelling (inflammation) and limit airflow, including chronic bronchitis and emphysema. The main symptom of COPD is shortness of breath, which makes it harder to do even simple tasks. This can also make it harder to exercise and be active. Talk with your health care provider about treatments to help you breathe better and actions you can take to prevent breathing problems during physical activity. What are the benefits of exercising with COPD? Exercising regularly is an important part of a healthy lifestyle. You can still exercise and do physical activities even though you have COPD. Exercise and physical activity improve your shortness of breath by increasing blood flow (circulation). This causes your heart to pump more oxygen through your body. Moderate exercise can improve your:  Oxygen use.  Energy level.  Shortness of breath.  Strength in your breathing muscles.  Heart health.  Sleep.  Self-esteem and feelings of self-worth.  Depression, stress, and anxiety levels. Exercise can benefit everyone with COPD. The severity of your disease may affect how hard you can exercise, especially at first, but everyone can benefit. Talk with your health care provider about how much exercise is safe for you, and which activities and exercises are safe for you. What actions can I take to prevent breathing problems during physical activity?  Sign up for a pulmonary rehabilitation program. This type of program may include: ? Education about lung diseases. ? Exercise classes that teach you how to exercise and be more active while improving your breathing. This usually involves:  Exercise using your lower extremities, such as a stationary  bicycle.  About 30 minutes of exercise, 2 to 5 times per week, for 6 to 12 weeks  Strength training, such as push ups or leg lifts. ? Nutrition education. ? Group classes in which you can talk with others who also have COPD and learn ways to manage stress.  If you use an oxygen tank, you should use it while you exercise. Work with your health care provider to adjust your oxygen for your physical activity. Your resting flow rate is different from your flow rate during physical activity.  While you are exercising: ? Take slow breaths. ? Pace yourself and do not try to go too fast. ? Purse your lips while breathing out. Pursing your lips is similar to a kissing or whistling position. ? If doing exercise that uses a quick burst of effort, such as weight lifting:  Breathe in before starting the exercise.  Breathe out during the hardest part of the exercise (such as raising the weights). Where to find support You can find support for exercising with COPD from:  Your health care provider.  A pulmonary rehabilitation program.  Your local health department or community health programs.  Support groups, online or in-person. Your health care provider may be able to recommend support groups. Where to find more information You can find more information about exercising with COPD from:  American Lung Association: ClassInsider.se.  COPD Foundation: https://www.rivera.net/. Contact a health care provider if:  Your symptoms get worse.  You have chest pain.  You have nausea.  You have a fever.  You have trouble talking or catching your breath.  You want to start a new exercise program or a new activity. Summary  COPD is a general term that can be used to describe many different lung problems that cause lung swelling (inflammation) and limit airflow. This includes chronic bronchitis and emphysema.  Exercise and physical activity improve your shortness of breath by increasing blood flow  (circulation). This causes your heart to provide more oxygen to your body.  Contact your health care provider before starting any exercise program or new activity. Ask your health care provider what exercises and activities are safe for you. This information is not intended to replace advice given to you by your health care provider. Make sure you discuss any questions you have with your health care provider. Document Released: 04/15/2017 Document Revised: 04/15/2017 Document Reviewed: 04/15/2017 Elsevier Interactive Patient Education  2019 [employer] Inc.   Chronic Obstructive Pulmonary Disease Exacerbation Chronic obstructive pulmonary disease (COPD) is a long-term (chronic) lung problem. In COPD, the flow of air from the lungs is limited. COPD exacerbations are times that breathing gets worse and you need more than your normal treatment. Without treatment, they can be life threatening. If they happen often, your lungs can become more damaged. If your COPD gets worse, your doctor may treat you with:  Medicines.  Oxygen.  Different ways to clear your airway, such as using a mask. Follow these instructions at home: Medicines  Take over-the-counter and prescription medicines only as told by your doctor.  If you take an antibiotic or steroid medicine, do not stop taking the medicine even if you start to feel better.  Keep up with shots (vaccinations) as told by your doctor. Be sure to get a yearly (annual) flu shot. Lifestyle  Do not smoke. If you need help quitting, ask your doctor.  Eat healthy foods.  Exercise regularly.  Get plenty of sleep.  Avoid tobacco smoke and other things that can bother your lungs.  Wash your hands often with soap and water. This will help keep you from getting an infection. If you cannot use soap and water, use hand sanitizer.  During flu season, avoid areas that are crowded with people. General instructions  Drink enough fluid to keep your pee  (urine) clear or pale yellow. Do not do this if your doctor has told you not to.  Use a cool mist machine (vaporizer).  If you use oxygen or a machine that turns medicine into a mist (nebulizer), continue to use it as told.  Follow all instructions for rehabilitation. These are steps you can take to make your body work better.  Keep all follow-up visits as told by your doctor. This is important. Contact a doctor if:  Your COPD symptoms get worse than normal. Get help right away if:  You are short of breath and it gets worse.  You have trouble talking.  You have chest pain.  You cough up blood.  You have a fever.  You keep throwing up (vomiting).  You feel weak or you pass out (faint).  You feel confused.  You are not able to sleep because of your symptoms.  You are not able to do daily activities. Summary  COPD exacerbations are times that breathing gets worse and you need more treatment than normal.  COPD exacerbations can be very serious and may cause your lungs to become more damaged.  Do not smoke. If you need help quitting, ask your doctor.  Stay up-to-date on your shots. Get a flu shot every year. This information is not intended to replace advice given to you by your health care provider.  Make sure you discuss any questions you have with your health care provider. Document Released: 03/12/2011 Document Revised: 04/27/2016 Document Reviewed: 04/27/2016 Elsevier Interactive Patient Education  2019 [place] Lip Breathing Pursed lip breathing is a technique to relieve the feeling of being short of breath. Some long-term respiratory conditions, like chronic obstructive pulmonary disease (COPD) and severe asthma, can make it hard to breathe out (exhale) all of the air in your lungs. This can make air that has less oxygen than normal build up in your lungs (air trapping). Trapped air means your lungs fill with less fresh air when you breathe in (inhale).  As a result, you feel short of breath. Pursed lip breathing keeps your airways open longer when you exhale and empties more air from your lungs. This makes more space for fresh air when you inhale. Pursed lip breathing can also slow down your breathing and help your body not have to work so hard to breathe. Over time, pursed lip breathing may help you be able to be more physically active and do more activities. How to perform pursed lip breathing Being short of breath can make you tense and anxious. Before you start this breathing exercise, take a minute to relax your shoulders and close your eyes. Then: 1. Start the exercise by closing your mouth. 2. Breathe in through your nose, taking a normal breath. You can do this at your normal rate of breathing. If you feel you are not getting enough air, breathe in while slowly counting to 2 or 3. 3. Pucker (purse) your lips as if you were going to whistle. 4. Gently tighten your abdomen muscles or press on your belly to help push the air out. 5. Breathe out slowly through your pursed lips. Take at least twice as long to breathe out as it takes you to breathe in. 6. Make sure that you breathe out all of the air, but do not force air out. 7. Repeat the exercise until your breathing improves. Ask your health care provider how often and how long to do this exercise. Follow these instructions at home:  Take over-the-counter and prescription medicines only as told by your health care provider.  Return to your normal activities as told by your health care provider. Ask your health care provider what activities are safe for you.  Do not use any products that contain nicotine or tobacco, such as cigarettes and e-cigarettes. If you need help quitting, ask your health care provider.  Keep all follow-up visits as told by your health care provider. This is important. Contact a health care provider if:  Your shortness of breath gets worse.  You become less able  to exercise or be active.  You develop a cough.  You develop a fever. Get help right away if:  You are struggling to breathe.  Your shortness of breath prevents you from engaging in any activity. Summary  Pursed lip breathing is a breathing technique that helps to remove trapped air from your lungs. It helps you get more oxygen into your lungs and makes your body have to work less hard to breathe.  Pursed lip breathing can gradually make you more able to be physically active.  You can do pursed lip breathing on your own at home.  Ask your health care provider how often and how long you should do pursed lip breathing. This information is not intended to replace advice given to you by your health care provider. Make sure you  discuss any questions you have with your health care provider. Document Released: 12/31/2007 Document Revised: 02/13/2016 Document Reviewed: 02/13/2016 Elsevier Interactive Patient Education  2019 [employer].   How to Use a Metered Dose Inhaler A metered dose inhaler is a handheld device for taking medicine that must be breathed into the lungs (inhaled). The device can be used to deliver a variety of inhaled medicines, including:  Quick relief or rescue medicines, such as bronchodilators.  Controller medicines, such as corticosteroids. The medicine is delivered by pushing down on a metal canister to release a preset amount of spray and medicine. Each device contains the amount of medicine that is needed for a preset number of uses (inhalations). Your health care provider may recommend that you use a spacer with your inhaler to help you take the medicine more effectively. A spacer is a plastic tube with a mouthpiece on one end and an opening that connects to the inhaler on the other end. A spacer holds the medicine in a tube for a short time, which allows you to inhale more medicine. What are the risks? If you do not use your inhaler correctly, medicine might not  reach your lungs to help you breathe. Inhaler medicine can cause side effects, such as:  Mouth or throat infection.  Cough.  Hoarseness.  Headache.  Nausea and vomiting.  Lung infection (pneumonia) in people who have a lung condition called COPD. How to use a metered dose inhaler without a spacer  1. Remove the cap from the inhaler. 2. If you are using the inhaler for the first time, shake it for 5 seconds, turn it away from your face, then release 4 puffs into the air. This is called priming. 3. Shake the inhaler for 5 seconds. 4. Position the inhaler so the top of the canister faces up. 5. Put your index finger on the top of the medicine canister. Support the bottom of the inhaler with your thumb. 6. Breathe out normally and as completely as possible, away from the inhaler. 7. Either place the inhaler between your teeth and close your lips tightly around the mouthpiece, or hold the inhaler 1-2 inches (2.5-5 cm) away from your open mouth. Keep your tongue down out of the way. If you are unsure which technique to use, ask your health care provider. 8. Press the canister down with your index finger to release the medicine, then inhale deeply and slowly through your mouth (not your nose) until your lungs are completely filled. Inhaling should take 4-6 seconds. 9. Hold the medicine in your lungs for 5-10 seconds (10 seconds is best). This helps the medicine get into the small airways of your lungs. 10. With your lips in a tight circle (pursed), breathe out slowly. 11. Repeat steps 3-10 until you have taken the number of puffs that your health care provider directed. Wait about 1 minute between puffs or as directed. 12. Put the cap on the inhaler. 13. If you are using a steroid inhaler, rinse your mouth with water, gargle, and spit out the water. Do not swallow the water. How to use a metered dose inhaler with a spacer  1. Remove the cap from the inhaler. 2. If you are using the inhaler  for the first time, shake it for 5 seconds, turn it away from your face, then release 4 puffs into the air. This is called priming. 3. Shake the inhaler for 5 seconds. 4. Place the open end of the spacer onto the inhaler mouthpiece.  5. Position the inhaler so the top of the canister faces up and the spacer mouthpiece faces you. 6. Put your index finger on the top of the medicine canister. Support the bottom of the inhaler and the spacer with your thumb. 7. Breathe out normally and as completely as possible, away from the spacer. 8. Place the spacer between your teeth and close your lips tightly around it. Keep your tongue down out of the way. 9. Press the canister down with your index finger to release the medicine, then inhale deeply and slowly through your mouth (not your nose) until your lungs are completely filled. Inhaling should take 4-6 seconds. 10. Hold the medicine in your lungs for 5-10 seconds (10 seconds is best). This helps the medicine get into the small airways of your lungs. 11. With your lips in a tight circle (pursed), breathe out slowly. 12. Repeat steps 3-11 until you have taken the number of puffs that your health care provider directed. Wait about 1 minute between puffs or as directed. 13. Remove the spacer from the inhaler and put the cap on the inhaler. 14. If you are using a steroid inhaler, rinse your mouth with water, gargle, and spit out the water. Do not swallow the water. Follow these instructions at home:  Take your inhaled medicine only as told by your health care provider. Do not use the inhaler more than directed by your health care provider.  Keep all follow-up visits as told by your health care provider. This is important.  If your inhaler has a counter, you can check it to determine how full your inhaler is. If your inhaler does not have a counter, ask your health care provider when you will need to refill your inhaler and write the refill date on a calendar or  on your inhaler canister. Note that you cannot know when an inhaler is empty by shaking it.  Follow directions on the package insert for care and cleaning of your inhaler and spacer. Contact a health care provider if:  Symptoms are only partially relieved with your inhaler.  You are having trouble using your inhaler.  You have an increase in phlegm.  You have headaches. Get help right away if:  You feel little or no relief after using your inhaler.  You have dizziness.  You have a fast heart rate.  You have chills or a fever.  You have night sweats.  There is blood in your phlegm. Summary  A metered dose inhaler is a handheld device for taking medicine that must be breathed into the lungs (inhaled).  The medicine is delivered by pushing down on a metal canister to release a preset amount of spray and medicine.  Each device contains the amount of medicine that is needed for a preset number of uses (inhalations). This information is not intended to replace advice given to you by your health care provider. Make sure you discuss any questions you have with your health care provider. Document Released: 03/23/2005 Document Revised: 10/12/2016 Document Reviewed: 02/11/2016 Elsevier Interactive Patient Education  2019 Elsevier Inc.  IMPORTANT INFORMATION: PAY CLOSE ATTENTION   PHYSICIAN DISCHARGE INSTRUCTIONS  Follow with Primary care provider  Molly Evens, MD  and other consultants as instructed your Hospitalist Physician  SEEK MEDICAL CARE OR RETURN TO EMERGENCY ROOM IF SYMPTOMS COME BACK, WORSEN OR NEW PROBLEM DEVELOPS.   Please note: You were cared for by a hospitalist during your hospital stay. Every effort will be made to forward records  to your primary care provider.  You can request that your primary care provider send for your hospital records if they have not received them.  Once you are discharged, your primary care physician will handle any further medical  issues. Please note that NO REFILLS for any discharge medications will be authorized once you are discharged, as it is imperative that you return to your primary care physician (or establish a relationship with a primary care physician if you do not have one) for your post hospital discharge needs so that they can reassess your need for medications and monitor your lab values.  Please get a complete blood count and chemistry panel checked by your Primary MD at your next visit, and again as instructed by your Primary MD.  Get Medicines reviewed and adjusted: Please take all your medications with you for your next visit with your Primary MD  Laboratory/radiological data: Please request your Primary MD to go over all hospital tests and procedure/radiological results at the follow up, please ask your primary care provider to get all Hospital records sent to his/her office.  In some cases, they will be blood work, cultures and biopsy results pending at the time of your discharge. Please request that your primary care provider follow up on these results.  If you are diabetic, please bring your blood sugar readings with you to your follow up appointment with primary care.    Please call and make your follow up appointments as soon as possible.    Also Note the following: If you experience worsening of your admission symptoms, develop shortness of breath, life threatening emergency, suicidal or homicidal thoughts you must seek medical attention immediately by calling 911 or calling your MD immediately  if symptoms less severe.  You must read complete instructions/literature along with all the possible adverse reactions/side effects for all the Medicines you take and that have been prescribed to you. Take any new Medicines after you have completely understood and accpet all the possible adverse reactions/side effects.   Do not drive when taking Pain medications or sleeping medications  (Benzodiazepines)  Do not take more than prescribed Pain, Sleep and Anxiety Medications. It is not advisable to combine anxiety,sleep and pain medications without talking with your primary care practitioner  Special Instructions: If you have smoked or chewed Tobacco  in the last 2 yrs please stop smoking, stop any regular Alcohol  and or any Recreational drug use.  Wear Seat belts while driving.    Home Oxygen Use, Adult When a medical condition keeps you from getting enough oxygen, your health care provider may instruct you to take extra oxygen at home. Your health care provider will let you know:  When to take oxygen.  For how long to take oxygen.  How quickly oxygen should be delivered (flow rate), in liters per minute (LPM or L/M). Home oxygen can be given through:  A mask.  A nasal cannula. This is a device or tube that goes in the nostrils.  A transtracheal catheter. This is a small, flexible tube placed in the trachea.  A tracheostomy. This is a surgically made opening in the trachea. These devices are connected with tubing to an oxygen source, such as:  A tank. Tanks hold oxygen in gas form. They must be replaced when the oxygen is used up.  A liquid oxygen device. This holds oxygen in liquid form. It must be replaced when the oxygen is used up.  An oxygen concentrator machine. This filters oxygen  in the room. It uses electricity, so you must have a backup cylinder of oxygen in case the power goes out. Supplies needed: To use oxygen, you will need:  A mask, nasal cannula, transtracheal catheter, or tracheostomy.  An oxygen tank, a liquid oxygen device, or an oxygen concentrator.  The tape that your health care provider recommends (optional). If you use a transtracheal catheter and your prescribed flow rate is 1 LPM or greater, you will also need a humidifier. Risks and complications  Fire. This can happen if the oxygen is exposed to a heat source, flame, or  spark.  Injury to skin. This can happen if liquid oxygen touches your skin.  Organ damage. This can happen if you get too little oxygen. How to use oxygen Your health care provider or a representative from your [place] will show you how to use your oxygen device. Follow her or his instructions. The instructions may look something like this: 1. Wash your hands. 2. If you use an oxygen concentrator, make sure it is plugged in. 3. Place one end of the tube into the port on the tank, device, or machine. 4. Place the mask over your nose and mouth. Or, place the nasal cannula and secure it with tape if instructed. If you use a tracheostomy or transtracheal catheter, connect it to the oxygen source as directed. 5. Make sure the liter-flow setting on the machine is at the level prescribed by your health care provider. 6. Turn on the machine or adjust the knob on the tank or device to the correct liter-flow setting. 7. When you are done, turn off and unplug the machine, or turn the knob to OFF. How to clean and care for the oxygen supplies Nasal cannula  Clean it with a warm, wet cloth daily or as needed.  Wash it with a liquid soap once a week.  Rinse it thoroughly once or twice a week.  Replace it every 2-4 weeks.  If you have an infection, such as a cold or pneumonia, change the cannula when you get better. Mask  Replace it every 2-4 weeks.  If you have an infection, such as a cold or pneumonia, change the mask when you get better. Humidifier bottle  Wash the bottle between each refill: ? Wash it with soap and warm water. ? Rinse it thoroughly. ? Disinfect it and its top. ? Air-dry it.  Make sure it is dry before you refill it. Oxygen concentrator  Clean the air filter at least twice a week according to directions from your home medical equipment and service company.  Wipe down the cabinet every day. To do this: ? Unplug the unit. ? Wipe down the cabinet with a  damp cloth. ? Dry the cabinet. Other equipment  Change any extra tubing every 1-3 months.  Follow instructions from your health care provider about taking care of any other equipment. Safety tips Fire safety tips   Keep your oxygen and oxygen supplies at least 5 ft away from sources of heat, flames, and sparks at all times.  Do not allow smoking near your oxygen. Put up "no smoking" signs in your home. Avoid smoking areas when in public.  Do not use materials that can burn (are flammable) while you use oxygen.  When you go to a restaurant with portable oxygen, ask to be seated in the nonsmoking section.  Keep a Data processing manager close by. Let your fire department know that you have oxygen in your home.  Test your home smoke detectors regularly. Traveling  Secure your oxygen tank in the vehicle so that it does not move around. Follow instructions from your medical device company about how to safely secure your tank.  Make sure you have enough oxygen for the amount of time you will be away from home.  If you are planning air travel, contact the airline to find out if they allow the use of an approved portable oxygen concentrator. You may also need documents from your health care provider and medical device company before you travel. General safety tips  If you use an oxygen cylinder, make sure it is in a stand or secured to an object that will not move (fixed object).  If you use liquid oxygen, make sure its container is kept upright.  If you use an oxygen concentrator: ? Dance movement psychotherapist company. Make sure you are given priority service in the event that your power goes out. ? Avoid using extension cords, if possible. Follow these instructions at home:  Use oxygen only as told by your health care provider.  Do not use alcohol or other drugs that make you relax (sedating drugs) unless instructed. They can slow down your breathing rate and make it hard to get in enough  oxygen.  Know how and when to order a refill of oxygen.  Always keep a spare tank of oxygen. Plan ahead for holidays when you may not be able to get a prescription filled.  Use water-based lubricants on your lips or nostrils. Do not use oil-based products like petroleum jelly.  To prevent skin irritation on your cheeks or behind your ears, tuck some gauze under the tubing. Contact a health care provider if:  You get headaches often.  You have shortness of breath.  You have a lasting cough.  You have anxiety.  You are sleepy all the time.  You develop an illness that affects your breathing.  You cannot exercise at your regular level.  You are restless.  You have difficult or irregular breathing, and it is getting worse.  You have a fever.  You have persistent redness under your nose. Get help right away if:  You are confused.  You have blue lips or fingernails.  You are struggling to breathe. Summary  Your health care provider or a representative from your [facility] will show you how to use your oxygen device. Follow her or his instructions.  If you use an oxygen concentrator, make sure it is plugged in.  Make sure the liter-flow setting on the machine is at the level prescribed by your health care provider.  Keep your oxygen and oxygen supplies at least 5 ft away from sources of heat, flames, and sparks at all times. This information is not intended to replace advice given to you by your health care provider. Make sure you discuss any questions you have with your health care provider. Document Released: 06/13/2003 Document Revised: 09/09/2017 Document Reviewed: 10/15/2015 Elsevier Interactive Patient Education  2019 [employer].

## 2018-07-27 NOTE — Clinical Social Work Note (Addendum)
Patient provided DME options. Patient chose [facility] for DME/oxygen needs. Juliann Pulse at [facility] provided referral for oxygen needs.  Patient's family is familiar with [facility] and [facility] (RN) referral was placed with Vaughan Basta at [facility]. Patient's son contacted LCSW and was provided the name and contact information to [facility].   Shannia Jacuinde, Clydene Pugh, LCSW

## 2018-07-27 NOTE — Progress Notes (Signed)
Nurse spoke to son Krisa Blattner ) and made him aware that patient was on floor.  He verbalized understanding and state he would like to be called if any changes through the night.  Patient spoke to husband before she went to bed.

## 2018-07-27 NOTE — Progress Notes (Signed)
Patient discharged home today per MD orders. Patient vital signs WDL. IV removed and site WDL. Discharge Instructions including follow up appointments, medications, and education reviewed with patient. Patient verbalizes understanding. Patient is transported out via wheelchair.

## 2018-07-27 NOTE — Progress Notes (Signed)
Patient has been up walking around in room with nurse this morning and sits on the side of the bed to eat breakfast. Patient has moments of forgetfulness but is easily reoriented.Patient states that she feels better than she did when she came in. Patient states that she does not want to eat a lot because it upsets her stomach when she does eat. Patient is left sitting in the bed as she attempts to eat her toast and drink her apple juice this morning with no complaints to offer.

## 2018-08-18 ENCOUNTER — Telehealth (HOSPITAL_COMMUNITY): Payer: Self-pay | Admitting: Vascular Surgery

## 2018-08-18 NOTE — Telephone Encounter (Signed)
Pt son called wanting a refill Combivent inhaler prescribed by [facility] per PT son. Please advise

## 2018-08-18 NOTE — Telephone Encounter (Signed)
I spoke with patient's son and he agrees to reach out to PCP to inquire about Combivent inhaler which is not currently listed on patient's medlist in [facility].

## 2018-08-24 ENCOUNTER — Ambulatory Visit (HOSPITAL_COMMUNITY)
Admission: RE | Admit: 2018-08-24 | Discharge: 2018-08-24 | Disposition: A | Discharge status: Home / Self Care | Payer: Medicare Other | Source: Ambulatory Visit | Attending: Cardiology | Admitting: Cardiology

## 2018-08-24 ENCOUNTER — Encounter (HOSPITAL_COMMUNITY): Payer: Self-pay

## 2018-08-24 ENCOUNTER — Other Ambulatory Visit: Payer: Self-pay

## 2018-08-24 VITALS — BP 148/80 | HR 71 | Wt 153.0 lb

## 2018-08-24 DIAGNOSIS — Z79899 Other long term (current) drug therapy: Secondary | ICD-10-CM | POA: Diagnosis not present

## 2018-08-24 DIAGNOSIS — Z720 Tobacco use: Secondary | ICD-10-CM | POA: Diagnosis not present

## 2018-08-24 DIAGNOSIS — F17210 Nicotine dependence, cigarettes, uncomplicated: Secondary | ICD-10-CM | POA: Diagnosis not present

## 2018-08-24 DIAGNOSIS — Z9049 Acquired absence of other specified parts of digestive tract: Secondary | ICD-10-CM | POA: Diagnosis not present

## 2018-08-24 DIAGNOSIS — I272 Pulmonary hypertension, unspecified: Secondary | ICD-10-CM | POA: Diagnosis not present

## 2018-08-24 DIAGNOSIS — I50810 Right heart failure, unspecified: Secondary | ICD-10-CM | POA: Diagnosis not present

## 2018-08-24 DIAGNOSIS — I5032 Chronic diastolic (congestive) heart failure: Secondary | ICD-10-CM | POA: Diagnosis not present

## 2018-08-24 DIAGNOSIS — I110 Hypertensive heart disease with heart failure: Secondary | ICD-10-CM | POA: Insufficient documentation

## 2018-08-24 DIAGNOSIS — J449 Chronic obstructive pulmonary disease, unspecified: Secondary | ICD-10-CM | POA: Diagnosis not present

## 2018-08-24 LAB — BASIC METABOLIC PANEL
Anion gap: 10 (ref 5–15)
BUN: 10 mg/dL (ref 8–23)
CO2: 28 mmol/L (ref 22–32)
Calcium: 9.3 mg/dL (ref 8.9–10.3)
Chloride: 95 mmol/L — ABNORMAL LOW (ref 98–111)
Creatinine, Ser: 0.74 mg/dL (ref 0.44–1.00)
GFR calc Af Amer: >60 mL/min (ref >60)
GFR calc non Af Amer: >60 mL/min (ref >60)
Glucose, Bld: 81 mg/dL (ref 70–99)
Potassium: 3.9 mmol/L (ref 3.5–5.1)
Sodium: 133 mmol/L — ABNORMAL LOW (ref 135–145)

## 2018-08-24 LAB — BRAIN NATRIURETIC PEPTIDE: B Natriuretic Peptide: 139.6 pg/mL — ABNORMAL HIGH (ref 0.0–100.0)

## 2018-08-24 NOTE — Progress Notes (Signed)
The pt performed a 6 min walk. Pt was unable to complete entire 6 mins due to back/hip pain from an injection yesterday. She was able to abulate 106.44min 3 min. Her heart rate ranged from 81-1-2bpm. Her o2 sats ranged from 94-85% ra. Pt denied shortness of breath. Pt tolerated walk fine besides the back/ hip pain.

## 2018-08-24 NOTE — Patient Instructions (Signed)
Lab work was done today. We will notify you of any abnormal lab work. No news is good news.  You have been given a prescription for ted hose. This can be filled at any medical supply store.  Your physician has requested that you have an echocardiogram. Echocardiography is a painless test that uses sound waves to create images of your heart. It provides your doctor with information about the size and shape of your heart and how well your heart's chambers and valves are working. This procedure takes approximately one hour. There are no restrictions for this procedure. This will be done at your follow up appointment in 6 weeks.  Please follow up with Dr. Aundra Dubin in 6 week with an echocardiogram.

## 2018-08-24 NOTE — Progress Notes (Signed)
PCP: Dr. Karie Kirks Cardiology: Dr. Harrington Challenger HF Cardiology: Dr. Aundra Dubin  73 yo smoker with COPD and HTN presents for evaluation of pulmonary hypertension.  Echo in 1/20 showed severe RV dilation and dysfunction.  RHC in 1/20 showed moderate pulmonary hypertension with elevated PVR and low cardiac output.  PCWP was normal.  PFTs showed moderate-severe obstruction.  V/Q scan showed no chronic PE.   Admitted 4/21-4/22/20 with COPD exacerbation with hypoxia and vomiting. COVID-19 was negative. She was given 1L IV fluid and symptoms improved. Treated with doxy and prednisone taper. She was also set up for home O2.   She presents today for PAH follow up. Last visit, Dr Aundra Dubin started her on opsumit and daily lasix. Overall doing fine. She has not noticed much of a change with the opsumit. She is able to do chores and walk on flat ground with no SOB. She now has O2 at home that she wears qHS, sometimes PRN. O2 sats 88-94% on home checks. She has some BLE edema that improves after elevating them. No orthopnea, or PND. No dizziness or CP. Weight 150-152 lbs at home. Taking all medications. Eating high salt foods. Her family is getting groceries. She lives with her husband. She is smoking 3-4 cigarettes/day. SBP 130s  6 minute walk (2/20): 298 m 6 minutes walk (5/20): 106 m in 3 minutes, desat to 85%. Stopped due to back pain.   Labs (1/20): K 4, creatinine 0.8, TSH normal, BNP 994 Labs (4/20): K 4.0, creatinine 0.63, Na 129  PMH: 1. HTN 2. COPD: Active smoker.  - PFTs (2/20): FVC 51%, FEV1 57%, ratio 72%, TLC 93%, DLCO 51% => moderate-severe obstructive disease.  3. H/o cholecystectomy 4. OA 5. H/o back surgery 6. Pulmonary hypertension:  - Echo (1/20): EF 60-65%, mild LVH, severe RV dilation with severely decreased RV systolic function.  - RHC (1/20): mean RA 10, PA 55/21 mean 36, mean PCWP 7, CI 1.6, PVR 10 WU.  - V/Q scan (2/20): Not suggestive of chronic PE.  - CTA chest (1/20): No PE, no evidence for  ILD.   Family History  Problem Relation Age of Onset  . Cancer Sister   . Cancer Brother   . Cancer Maternal Aunt   . Colon cancer Neg Hx    Social History   Socioeconomic History  . Marital status: Married    Spouse name: Not on file  . Number of children: Not on file  . Years of education: Not on file  . Highest education level: Not on file  Occupational History  . Not on file  Social Needs  . Financial resource strain: Not on file  . Food insecurity:    Worry: Not on file    Inability: Not on file  . Transportation needs:    Medical: Not on file    Non-medical: Not on file  Tobacco Use  . Smoking status: Current Every Day Smoker    Packs/day: 0.75    Years: 15.00    Pack years: 11.25    Types: Cigarettes  . Smokeless tobacco: Never Used  Substance and Sexual Activity  . Alcohol use: No  . Drug use: No  . Sexual activity: Not on file  Lifestyle  . Physical activity:    Days per week: Not on file    Minutes per session: Not on file  . Stress: Not on file  Relationships  . Social connections:    Talks on phone: Not on file    Gets together: Not  on file    Attends religious service: Not on file    Active member of club or organization: Not on file    Attends meetings of clubs or organizations: Not on file    Relationship status: Not on file  . Intimate partner violence:    Fear of current or ex partner: Not on file    Emotionally abused: Not on file    Physically abused: Not on file    Forced sexual activity: Not on file  Other Topics Concern  . Not on file  Social History Narrative  . Not on file   Review of systems complete and found to be negative unless listed in HPI.   Current Outpatient Medications  Medication Sig Dispense Refill  . albuterol (VENTOLIN HFA) 108 (90 Base) MCG/ACT inhaler Inhale 2 puffs into the lungs every 4 (four) hours as needed for wheezing or shortness of breath. 1 Inhaler 1  . clonazePAM (KLONOPIN) 1 MG tablet Take 1 mg by  mouth 4 (four) times daily as needed (For nerves).     . furosemide (LASIX) 40 MG tablet Take 0.5 tablets (20 mg total) by mouth daily. 30 tablet 3  . gabapentin (NEURONTIN) 300 MG capsule Take 300 mg by mouth 3 (three) times daily.    Marland Kitchen ibuprofen (ADVIL,MOTRIN) 200 MG tablet Take 400 mg by mouth 2 (two) times daily as needed (pain).    . macitentan (OPSUMIT) 10 MG tablet Take 1 tablet (10 mg total) by mouth daily. 30 tablet 3  . MAGNESIUM PO Take 1 tablet by mouth daily.    . pantoprazole (PROTONIX) 40 MG tablet Take 40 mg by mouth at bedtime.     Marland Kitchen spironolactone (ALDACTONE) 25 MG tablet Take 1 tablet (25 mg total) by mouth daily.    Marland Kitchen escitalopram (LEXAPRO) 10 MG tablet Take 1 tablet by mouth daily.    Marland Kitchen HYDROcodone-acetaminophen (NORCO) 10-325 MG tablet Take 1 tablet by mouth every 4 (four) hours as needed (pain).     . predniSONE (DELTASONE) 20 MG tablet Take 3 PO QAM x3days, 2 PO QAM x3days, 1 PO QAM x3days (Patient not taking: Reported on 08/24/2018) 18 tablet 0   No current facility-administered medications for this encounter.    BP (!) 148/80   Pulse 71   Wt 69.4 kg (153 lb)   SpO2 95%   BMI 24.69 kg/m    Wt Readings from Last 3 Encounters:  08/24/18 69.4 kg (153 lb)  07/26/18 63.5 kg (140 lb)  05/19/18 65.8 kg (145 lb)   General: Elderly. No resp difficulty. HEENT: Normal Neck: Supple. JVP 5-6. Carotids 2+ bilat; no bruits. No thyromegaly or nodule noted. Cor: PMI nondisplaced. RRR, No M/G/R noted Lungs: CTAB, normal effort. Abdomen: Soft, non-tender, non-distended, no HSM. No bruits or masses. +BS  Extremities: No cyanosis, clubbing, or rash. R and LLE trace ankle edema.  Neuro: Alert & orientedx3, cranial nerves grossly intact. moves all 4 extremities w/o difficulty. Affect pleasant  Assessment/Plan: 1. Pulmonary hypertension: Echo 1/20 with severe RV dilation/dysfunction.  RHC with moderate PH, high PVR, normal PCWP, low cardiac output.  No evidence for chronic PE by  V/Q scan.  PFTs show a moderate to severe obstructive defect.  She still smokes. 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  However, cannot rule out a group 1 component as elevated PVR seems somewhat out of proportion to lung disease.  - HIV, anti-centromere Ab, anti-SCL70 Ab, RF all negative. ANA was not sent.  Will send today.  - Continue Opsumit for now. She has not noticed a symptomatic improvement and 6MW is not improved (although she stopped due to back/hip pain) so will not add a second PAH medication today. Discussed with Dr Aundra Dubin.  - Repeat echo to see if any improvement in RV with opsumit.  2. Chronic diastolic CHF/RV failure:  - NYHA II-III. Volume okay on exam despite weight gain.  - Continue Lasix 20 mg daily. Check BMET today.  - Continue spiro 25 mg daily - Start wearing compression stockings.  3. COPD/smoking: She still smokes 3-4 cigs/day.  She is working on quitting. Discussed again today.  4. HTN: SBP 130s at home. Mildly elevated in clinic today.   Resend ANA. Check BMET and BNP today.  Check echo at follow up in 6 weeks with Dr Aundra Dubin.    Georgiana Shore 08/24/2018  Greater than 50% of the 25 minute visit was spent in counseling/coordination of care regarding disease state education, salt/fluid restriction, sliding scale diuretics, and medication compliance.

## 2018-08-25 LAB — ANA: Anti Nuclear Antibody (ANA): NEGATIVE

## 2018-09-02 ENCOUNTER — Encounter (HOSPITAL_COMMUNITY): Payer: Medicare Other | Admitting: Cardiology

## 2018-10-18 ENCOUNTER — Encounter (HOSPITAL_COMMUNITY): Payer: Self-pay | Admitting: Cardiology

## 2018-10-18 ENCOUNTER — Other Ambulatory Visit: Payer: Self-pay

## 2018-10-18 ENCOUNTER — Ambulatory Visit (HOSPITAL_BASED_OUTPATIENT_CLINIC_OR_DEPARTMENT_OTHER)
Admission: RE | Admit: 2018-10-18 | Discharge: 2018-10-18 | Disposition: A | Discharge status: Home / Self Care | Payer: Medicare Other | Source: Ambulatory Visit | Attending: Cardiology | Admitting: Cardiology

## 2018-10-18 ENCOUNTER — Ambulatory Visit (HOSPITAL_COMMUNITY)
Admission: RE | Admit: 2018-10-18 | Discharge: 2018-10-18 | Disposition: A | Discharge status: Home / Self Care | Payer: Medicare Other | Source: Ambulatory Visit | Attending: Cardiology | Admitting: Cardiology

## 2018-10-18 VITALS — BP 124/60 | HR 87 | Wt 155.4 lb

## 2018-10-18 DIAGNOSIS — F17210 Nicotine dependence, cigarettes, uncomplicated: Secondary | ICD-10-CM | POA: Insufficient documentation

## 2018-10-18 DIAGNOSIS — I5032 Chronic diastolic (congestive) heart failure: Secondary | ICD-10-CM

## 2018-10-18 DIAGNOSIS — Z79899 Other long term (current) drug therapy: Secondary | ICD-10-CM | POA: Insufficient documentation

## 2018-10-18 DIAGNOSIS — I272 Pulmonary hypertension, unspecified: Secondary | ICD-10-CM

## 2018-10-18 DIAGNOSIS — K219 Gastro-esophageal reflux disease without esophagitis: Secondary | ICD-10-CM | POA: Diagnosis not present

## 2018-10-18 DIAGNOSIS — E079 Disorder of thyroid, unspecified: Secondary | ICD-10-CM | POA: Diagnosis not present

## 2018-10-18 DIAGNOSIS — J449 Chronic obstructive pulmonary disease, unspecified: Secondary | ICD-10-CM | POA: Insufficient documentation

## 2018-10-18 DIAGNOSIS — I110 Hypertensive heart disease with heart failure: Secondary | ICD-10-CM | POA: Diagnosis not present

## 2018-10-18 LAB — BASIC METABOLIC PANEL
Anion gap: 12 (ref 5–15)
BUN: 20 mg/dL (ref 8–23)
CO2: 28 mmol/L (ref 22–32)
Calcium: 8.8 mg/dL — ABNORMAL LOW (ref 8.9–10.3)
Chloride: 97 mmol/L — ABNORMAL LOW (ref 98–111)
Creatinine, Ser: 1.57 mg/dL — ABNORMAL HIGH (ref 0.44–1.00)
GFR calc Af Amer: 38 mL/min — ABNORMAL LOW (ref >60)
GFR calc non Af Amer: 32 mL/min — ABNORMAL LOW (ref >60)
Glucose, Bld: 95 mg/dL (ref 70–99)
Potassium: 4.5 mmol/L (ref 3.5–5.1)
Sodium: 137 mmol/L (ref 135–145)

## 2018-10-18 LAB — BRAIN NATRIURETIC PEPTIDE: B Natriuretic Peptide: 1088.3 pg/mL — ABNORMAL HIGH (ref 0.0–100.0)

## 2018-10-18 MED ORDER — TADALAFIL (PAH) 20 MG PO TABS: 20.0000 mg | ORAL_TABLET | Freq: Every day | ORAL | 6 refills | Status: DC

## 2018-10-18 NOTE — Progress Notes (Signed)
PCP: Dr. Karie Kirks Cardiology: Dr. Harrington Challenger HF Cardiology: Dr. Aundra Dubin  73 yo smoker with COPD and HTN presents for followup of pulmonary hypertension.  Echo in 1/20 showed severe RV dilation and dysfunction.  RHC in 1/20 showed moderate pulmonary hypertension with elevated PVR and low cardiac output.  PCWP was normal.  PFTs showed moderate-severe obstruction.  V/Q scan showed no chronic PE. PFTs showed moderate to severe obstruction/COPD.    She was started on Opsumit given concern for pulmonary hypertension out of proportion to lung disease.    Echo was repeated today and reviewed.  EF 60-65% with moderate RV dilation and mildly decreased systolic function, D-shaped septum, mild-moderate TR, PASP down to 45 mmHg (76 mmHg on last echo).  She thinks Opsumit has helped some.  She is able to walk 50-75 yards on flat ground before she gets short of breath.  She is using oxygen sometimes when she sleeps but not much otherwise.  Oxygen saturation is 81% at rest today.  She still smokes 4-5 cigs/day, unable to tolerate Chantix and failed Wellbutrin.  No chest pain.  No orthopnea/PND.  Weight stable.   6 minute walk (2/20): 298 m 6 minute walk (7/20): 152 m  Labs (1/20): K 4, creatinine 0.8, TSH normal, BNP 994 Labs (2/20): HIV negative, SCL-70 negative, centromere Ab negative, RF negative Labs (5/20): BNP 140, ANA negative, K 3.9, creatinine 0.74  PMH: 1. HTN 2. COPD: Active smoker.  - PFTs (2/20): FVC 51%, FEV1 57%, ratio 72%, TLC 93%, DLCO 51% => moderate-severe obstructive disease.  3. H/o cholecystectomy 4. OA 5. H/o back surgery 6. Pulmonary hypertension:  - Echo (1/20): EF 60-65%, mild LVH, severe RV dilation with severely decreased RV systolic function, PASP 76 mmHg.  - RHC (1/20): mean RA 10, PA 55/21 mean 36, mean PCWP 7, CI 1.6, PVR 10 WU.  - V/Q scan (2/20): Not suggestive of chronic PE.  - CTA chest (1/20): No PE, no evidence for ILD.  - PFTs (2/20): Moderate to severe obstructive lung  disease. - Echo (7/20): EF 60-65%, with moderate RV dilation and mildly decreased systolic function, D-shaped septum, mild-moderate TR, PASP down to 45 mmHg (76 mmHg on last echo)  Family History  Problem Relation Age of Onset  . Cancer Sister   . Cancer Brother   . Cancer Maternal Aunt   . Colon cancer Neg Hx    Social History   Socioeconomic History  . Marital status: Married    Spouse name: Not on file  . Number of children: Not on file  . Years of education: Not on file  . Highest education level: Not on file  Occupational History  . Not on file  Social Needs  . Financial resource strain: Not on file  . Food insecurity    Worry: Not on file    Inability: Not on file  . Transportation needs    Medical: Not on file    Non-medical: Not on file  Tobacco Use  . Smoking status: Current Every Day Smoker    Packs/day: 0.75    Years: 15.00    Pack years: 11.25    Types: Cigarettes  . Smokeless tobacco: Never Used  Substance and Sexual Activity  . Alcohol use: No  . Drug use: No  . Sexual activity: Not on file  Lifestyle  . Physical activity    Days per week: Not on file    Minutes per session: Not on file  . Stress: Not on file  Relationships  . Social Herbalist on phone: Not on file    Gets together: Not on file    Attends religious service: Not on file    Active member of club or organization: Not on file    Attends meetings of clubs or organizations: Not on file    Relationship status: Not on file  . Intimate partner violence    Fear of current or ex partner: Not on file    Emotionally abused: Not on file    Physically abused: Not on file    Forced sexual activity: Not on file  Other Topics Concern  . Not on file  Social History Narrative  . Not on file   ROS: All systems reviewed and negative except as per HPI.   Current Outpatient Medications  Medication Sig Dispense Refill  . albuterol (VENTOLIN HFA) 108 (90 Base) MCG/ACT inhaler Inhale 2  puffs into the lungs every 4 (four) hours as needed for wheezing or shortness of breath. 1 Inhaler 1  . clonazePAM (KLONOPIN) 1 MG tablet Take 1 mg by mouth 4 (four) times daily as needed (For nerves).     Marland Kitchen escitalopram (LEXAPRO) 10 MG tablet Take 1 tablet by mouth daily.    . furosemide (LASIX) 40 MG tablet Take 0.5 tablets (20 mg total) by mouth daily. 30 tablet 3  . gabapentin (NEURONTIN) 300 MG capsule Take 300 mg by mouth 3 (three) times daily.    Marland Kitchen HYDROcodone-acetaminophen (NORCO) 10-325 MG tablet Take 1 tablet by mouth every 4 (four) hours as needed (pain).     Marland Kitchen ibuprofen (ADVIL,MOTRIN) 200 MG tablet Take 400 mg by mouth 2 (two) times daily as needed (pain).    . macitentan (OPSUMIT) 10 MG tablet Take 1 tablet (10 mg total) by mouth daily. 30 tablet 3  . pantoprazole (PROTONIX) 40 MG tablet Take 40 mg by mouth at bedtime.     Marland Kitchen spironolactone (ALDACTONE) 25 MG tablet Take 1 tablet (25 mg total) by mouth daily.    . tadalafil, PAH, (ADCIRCA) 20 MG tablet Take 1 tablet (20 mg total) by mouth daily. 30 tablet 6   No current facility-administered medications for this encounter.    BP 124/60   Pulse 87   Wt 70.5 kg (155 lb 6.4 oz)   SpO2 (!) 86%   BMI 25.08 kg/m  General: NAD Neck: No JVD, no thyromegaly or thyroid nodule.  Lungs: Distant breath sounds.  CV: Nondisplaced PMI.  Heart regular S1/S2, no S3/S4, no murmur.  No peripheral edema.  No carotid bruit.  Normal pedal pulses.  Abdomen: Soft, nontender, no hepatosplenomegaly, no distention.  Skin: Intact without lesions or rashes.  Neurologic: Alert and oriented x 3.  Psych: Normal affect. Extremities: No clubbing or cyanosis.  HEENT: Normal.   Assessment/Plan: 1. Pulmonary hypertension: Echo 1/20 with severe RV dilation/dysfunction.  RHC with moderate PH, high PVR, normal PCWP, low cardiac output.  No evidence for chronic PE by V/Q scan.  PFTs show a moderate to severe obstructive defect. Rheumatological serologies negative.  She still smokes.  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  However, cannot rule out a group 1 component as elevated PVR seems somewhat out of proportion to lung disease.  She says that her breathing is better on Opsumit but 6 minute walk today is worse than in the past.   - Continue Opsumit.  - I will try her on Adcirca 20 mg daily, can increase to  40 mg daily in the future if she tolerates.  2. Chronic diastolic CHF/RV failure: She does not appear volume overloaded on exam.   - Continue Lasix 20 mg daily, BMET/BNP today.   3. COPD/smoking: She still smokes 5 cigs/day.  She did not tolerate Chantix and Wellbutrin did not work.  - She is going to try nicotine patches.  - With oxygen saturation 81% at rest, she needs to use oxygen at all times.  She has oxygen at home.  We discussed this.  4. HTN: BP controlled.   Followup in 6 wks.   Loralie Champagne 10/18/2018

## 2018-10-18 NOTE — Progress Notes (Signed)
  Echocardiogram 2D Echocardiogram has been performed.  [name] 10/18/2018, 9:59 AM

## 2018-10-18 NOTE — Progress Notes (Signed)
6 minute walk, pt walked 152.4 meters.  Prior to ambulation O2 81% HR 98.  3L applied via [place].  Pt tolerated without complaints of dizziness. O2 as low as 68% while ambulating on oxygen, HR as high as 107.  At completion of walk O2 86% HR 90.  Pt instructed to wear O2 at all times while at home and out of her home.    PA submitted for Tadalafil 19m daily via [facility].

## 2018-10-18 NOTE — Progress Notes (Signed)
PA for TADALAFIL TAB 20MG is approved through 04/06/2019.

## 2018-10-18 NOTE — Patient Instructions (Addendum)
START Adcirca (tadalfil) 76m (1 tab) daily  Labs today We will only contact you if something comes back abnormal or we need to make some changes. Otherwise no news is good news!  WEAR OXYGEN ALL OF THE TIME!!   STOP smoking!  Your physician recommends that you schedule a follow-up appointment in: 6 weeks with Dr MAundra Dubin At [facility] you and your health needs are our priority. As part of our continuing mission to provide you with exceptional heart care, we have created designated Provider Care Teams. These Care Teams include your primary Cardiologist (physician) and Advanced Practice Providers (APPs- Physician Assistants and Nurse Practitioners) who all work together to provide you with the care you need, when you need it.   You may see any of the following providers on your designated Care Team at your next follow up: .Marland KitchenDr DGlori Bickers. Dr DLoralie Champagne. ADarrick Grinder NP

## 2018-10-19 ENCOUNTER — Telehealth (HOSPITAL_COMMUNITY): Payer: Self-pay

## 2018-10-19 ENCOUNTER — Telehealth (HOSPITAL_COMMUNITY): Payer: Self-pay | Admitting: Pharmacy Technician

## 2018-10-19 DIAGNOSIS — I272 Pulmonary hypertension, unspecified: Secondary | ICD-10-CM

## 2018-10-19 MED ORDER — FUROSEMIDE 40 MG PO TABS: 20.0000 mg | ORAL_TABLET | ORAL | 6 refills | Status: DC

## 2018-10-19 NOTE — Telephone Encounter (Signed)
Pt aware of results. Pt advised to take lasix every other day and repeat lab in 10days. Verbalized understanding. Card mailed.

## 2018-10-19 NOTE — Telephone Encounter (Signed)
-----  Message from Larey Dresser, MD sent at 10/18/2018  4:00 PM EDT ----- BNP high but creatinine also up to 1.5.  She does not look volume overloaded on exam today.  Would cut back Lasix to 20 mg every other day, BMET 10 daily.

## 2018-10-19 NOTE — Telephone Encounter (Signed)
Advanced Heart Failure Patient Advocate Encounter  Prior Authorization for Tadalafil (PAH) 39m has been approved.    PA# [id number] dates: 10/18/2018 through 04/06/2019  Patients co-pay is $6.42 (30 days)  Called pt and informed her of approval and co pay.  [facility] set [facility]ill and inform them of approval.  ECharlann Boxer CPhT

## 2018-10-31 ENCOUNTER — Ambulatory Visit (HOSPITAL_COMMUNITY)
Admission: RE | Admit: 2018-10-31 | Discharge: 2018-10-31 | Disposition: A | Discharge status: Home / Self Care | Payer: Medicare Other | Source: Ambulatory Visit | Attending: Cardiology | Admitting: Cardiology

## 2018-10-31 ENCOUNTER — Other Ambulatory Visit: Payer: Self-pay

## 2018-10-31 DIAGNOSIS — I272 Pulmonary hypertension, unspecified: Secondary | ICD-10-CM | POA: Diagnosis present

## 2018-10-31 LAB — BASIC METABOLIC PANEL
Anion gap: 9 (ref 5–15)
BUN: 10 mg/dL (ref 8–23)
CO2: 31 mmol/L (ref 22–32)
Calcium: 8.9 mg/dL (ref 8.9–10.3)
Chloride: 100 mmol/L (ref 98–111)
Creatinine, Ser: 0.77 mg/dL (ref 0.44–1.00)
GFR calc Af Amer: >60 mL/min (ref >60)
GFR calc non Af Amer: >60 mL/min (ref >60)
Glucose, Bld: 94 mg/dL (ref 70–99)
Potassium: 4.1 mmol/L (ref 3.5–5.1)
Sodium: 140 mmol/L (ref 135–145)

## 2018-11-01 ENCOUNTER — Other Ambulatory Visit: Payer: Self-pay

## 2018-11-01 ENCOUNTER — Emergency Department (HOSPITAL_COMMUNITY)
Admission: EM | Admit: 2018-11-01 | Discharge: 2018-11-01 | Disposition: A | Discharge status: Home / Self Care | Payer: Medicare Other | Attending: Emergency Medicine | Admitting: Emergency Medicine

## 2018-11-01 ENCOUNTER — Encounter (HOSPITAL_COMMUNITY): Payer: Self-pay | Admitting: *Deleted

## 2018-11-01 DIAGNOSIS — E039 Hypothyroidism, unspecified: Secondary | ICD-10-CM | POA: Diagnosis not present

## 2018-11-01 DIAGNOSIS — F17210 Nicotine dependence, cigarettes, uncomplicated: Secondary | ICD-10-CM | POA: Insufficient documentation

## 2018-11-01 DIAGNOSIS — R531 Weakness: Secondary | ICD-10-CM | POA: Diagnosis present

## 2018-11-01 DIAGNOSIS — M75101 Unspecified rotator cuff tear or rupture of right shoulder, not specified as traumatic: Secondary | ICD-10-CM | POA: Insufficient documentation

## 2018-11-01 DIAGNOSIS — J449 Chronic obstructive pulmonary disease, unspecified: Secondary | ICD-10-CM | POA: Insufficient documentation

## 2018-11-01 DIAGNOSIS — I10 Essential (primary) hypertension: Secondary | ICD-10-CM | POA: Insufficient documentation

## 2018-11-01 DIAGNOSIS — M67911 Unspecified disorder of synovium and tendon, right shoulder: Secondary | ICD-10-CM

## 2018-11-01 NOTE — ED Notes (Signed)
Pt on 2.5L [place] when roomed O2 97%

## 2018-11-01 NOTE — ED Notes (Signed)
Pt's oxygen 64% on room air.  Pt denies wearing oxygen chronically. Pt placed on 4L . Oxygen 93% at this time.

## 2018-11-01 NOTE — ED Notes (Signed)
Pt does not have home oxygen with her. Told pt we could call EMS to transport her home with O2. Pt declines transport with O2 and states she will be ok until she gets home.

## 2018-11-01 NOTE — ED Triage Notes (Addendum)
Pt c/o weakness to right shoulder when she lifts her arm x several weeks. Pt reports no weakness when her arm is relaxed in her chair. Denies injury. Pt has equal grips. Pt's O2 sat in triage 64% on RA. Pt denies SOB. Pt reports she wears O2 at home prn. [name]

## 2018-11-01 NOTE — ED Notes (Signed)
Pt backed down to 3 LNC

## 2018-11-03 ENCOUNTER — Telehealth (HOSPITAL_COMMUNITY): Payer: Self-pay

## 2018-11-03 NOTE — Telephone Encounter (Signed)
Son left message saying that patient experiencing possible s/e of opsumit.  When called, he states that patient started having symptoms when she started opsumit.  S/e includes  Angio edema, weakness, chills and leg swelling.  He denies any airway concerns. Spoke with Dr Aundra Dubin, pt to stop medication and f/u in 2 weeks in office. Will d/w scheduler to schedule patient for f/u.  Advised to continue monitoring patient and if she worsens to present to ED.  Verbalized understanding.

## 2018-11-03 NOTE — ED Provider Notes (Signed)
[facility] EMERGENCY DEPARTMENT Provider Note   CSN: [id number] Arrival date & time: 11/01/18  1623     History   Chief Complaint Chief Complaint  Patient presents with  . Extremity Weakness    x 2 weeks    HPI [name] is a 73 y.o. female.  HPI   73 year old female with right shoulder weakness.  She states that she is having difficulty raising her right arm above her head.  Maybe some mild discomfort but not really painful.  She denies any acute trauma or strain.  She first noticed this several weeks ago when she was trying to do her hair.  Symptoms have been persistent since then.  She is finally coming in today for evaluation because of encouragement by her husband.  Past Medical History:  Diagnosis Date  . Arthritis   . Chronic back pain   . GERD (gastroesophageal reflux disease)   . Hypertension   . Thyroid disease   . Ulcer    [place]th I    Patient Active Problem List   Diagnosis Date Noted  . COPD exacerbation ([place]) 07/26/2018  . Abdominal pain, other specified site 08/25/2012  . Loss of weight 08/25/2012    Past Surgical History:  Procedure Laterality Date  . ABDOMINAL HYSTERECTOMY    . BACK SURGERY     X4  . BILROTH I PROCEDURE    . CHOLECYSTECTOMY    . COLONOSCOPY   05/18/2003   [id number] colonoscopy/ Internal hemorrhoids.  Otherwise, normal rectum  . COLONOSCOPY  08/12/2010   Dr. Arnoldo Morale: cecum visualized and normal, colon and rectum normal. Torturous colon  . COLONOSCOPY N/A 09/07/2012   RMR: tubular adenoma, lipoma. Due for surveillance in 2021  . ESOPHAGOGASTRODUODENOSCOPY  05/18/2003   RMR:. Normal esophagus/Adenomatous-appearing mucosa at the anastomosis  . ESOPHAGOGASTRODUODENOSCOPY  08/12/2010   Dr. Arnoldo Morale: anastomosis widely patent, no ulcerations, CLO test negative  . RIGHT HEART CATH N/A 05/04/2018   Procedure: RIGHT HEART CATH;  Surgeon: Larey Dresser, MD;  Location: [place] [facility];  Service: Cardiovascular;   Laterality: N/A;  . STOMACH SURGERY     removed partial stomach  . YAG LASER APPLICATION Left 41/32/4401   Procedure: YAG LASER APPLICATION;  Surgeon: [name], MD;  Location: [facility];  Service: Ophthalmology;  Laterality: Left;  . YAG LASER APPLICATION Right 02/72/5366   Procedure: YAG LASER APPLICATION;  Surgeon: [name], MD;  Location: [facility];  Service: Ophthalmology;  Laterality: Right;     OB History   No obstetric history on file.      Home Medications    Prior to Admission medications   Medication Sig Start Date End Date Taking? Authorizing Provider  albuterol (VENTOLIN HFA) 108 (90 Base) MCG/ACT inhaler Inhale 2 puffs into the lungs every 4 (four) hours as needed for wheezing or shortness of breath. 07/27/18   Johnson, Clanford L, MD  clonazePAM (KLONOPIN) 1 MG tablet Take 1 mg by mouth 4 (four) times daily as needed (For nerves).     [provider]  escitalopram (LEXAPRO) 10 MG tablet Take 1 tablet by mouth daily. 07/15/18   [provider]  furosemide (LASIX) 40 MG tablet Take 0.5 tablets (20 mg total) by mouth every other day. 10/19/18   Larey Dresser, MD  gabapentin (NEURONTIN) 300 MG capsule Take 300 mg by mouth 3 (three) times daily.    [provider]  HYDROcodone-acetaminophen (NORCO) 10-325 MG tablet Take 1 tablet by mouth every 4 (four)  hours as needed (pain).  01/22/17   [provider]  ibuprofen (ADVIL,MOTRIN) 200 MG tablet Take 400 mg by mouth 2 (two) times daily as needed (pain).    [provider]  macitentan (OPSUMIT) 10 MG tablet Take 1 tablet (10 mg total) by mouth daily. 05/19/18   Larey Dresser, MD  pantoprazole (PROTONIX) 40 MG tablet Take 40 mg by mouth at bedtime.  08/24/12   [provider]  spironolactone (ALDACTONE) 25 MG tablet Take 1 tablet (25 mg total) by mouth daily. 07/31/18   Johnson, Clanford L, MD  tadalafil, PAH, (ADCIRCA) 20 MG tablet Take 1 tablet (20 mg total) by mouth daily.  10/18/18   Larey Dresser, MD    Family History Family History  Problem Relation Age of Onset  . Cancer Sister   . Cancer Brother   . Cancer Maternal Aunt   . Colon cancer Neg Hx     Social History Social History   Tobacco Use  . Smoking status: Current Every Day Smoker    Packs/day: 0.30    Years: 15.00    Pack years: 4.50    Types: Cigarettes  . Smokeless tobacco: Never Used  Substance Use Topics  . Alcohol use: No  . Drug use: No     Allergies   Chantix [varenicline], Codeine, and Oxycodone   Review of Systems Review of Systems  All systems reviewed and negative, other than as noted in HPI.  Physical Exam Updated Vital Signs BP (!) 158/88   Pulse 88   Temp 98.6 F (37 C)   Resp 17   Ht _0  (1.676 m)   Wt 70.8 kg   SpO2 94%   BMI 25.18 kg/m   Physical Exam Vitals signs and nursing note reviewed.  Constitutional:      General: She is not in acute distress.    Appearance: She is well-developed.  HENT:     Head: Normocephalic and atraumatic.  Eyes:     General:        Right eye: No discharge.        Left eye: No discharge.     Conjunctiva/sclera: Conjunctivae normal.  Neck:     Musculoskeletal: Neck supple.  Cardiovascular:     Rate and Rhythm: Normal rate and regular rhythm.     Heart sounds: Normal heart sounds. No murmur. No friction rub. No gallop.   Pulmonary:     Effort: Pulmonary effort is normal. No respiratory distress.     Breath sounds: Normal breath sounds.  Abdominal:     General: There is no distension.     Palpations: Abdomen is soft.     Tenderness: There is no abdominal tenderness.  Musculoskeletal:        General: No tenderness.     Comments: Right shoulder is worse than normal in appearance and symmetric as compared to the left.  She has good strength in her right upper extremity aside from having weakness when getting her arm about parallel level with the floor.  She is neurovascular intact.  Skin:    General: Skin is  warm and dry.  Neurological:     Mental Status: She is alert.  Psychiatric:        Behavior: Behavior normal.        Thought Content: Thought content normal.      ED Treatments / Results  Labs (all labs ordered are listed, but only abnormal results are displayed) Labs Reviewed - No data  to display  EKG None  Radiology No results found.  Procedures Procedures (including critical care time)  Medications Ordered in ED Medications - No data to display   Initial Impression / Assessment and Plan / ED Course  I have reviewed the triage vital signs and the nursing notes.  Pertinent labs & imaging results that were available during my care of the patient were reviewed by me and considered in my medical decision making (see chart for details).        73 year old female who seems to have dysfunction of her right rotator cuff.  No pain.  She is neurovascularly intact.  Advised to follow-up with orthopedics.  She reports that she is seeing somebody previously.  Return precautions were discussed.  Outpatient follow-up otherwise.  Final Clinical Impressions(s) / ED Diagnoses   Final diagnoses:  Rotator cuff dysfunction, right    ED Discharge Orders    None       Virgel Manifold, MD 11/03/18 1037

## 2018-11-04 NOTE — ED Notes (Signed)
Pt's son called looking for Pt's wallet, that has been misplaced.  We checked lockers and with Security but did not see one.  Son informed and wanted to be notified it something turned up.  Phone [phone].

## 2018-11-08 ENCOUNTER — Ambulatory Visit (INDEPENDENT_AMBULATORY_CARE_PROVIDER_SITE_OTHER): Payer: Medicare Other | Admitting: Orthopaedic Surgery

## 2018-11-08 ENCOUNTER — Encounter: Payer: Self-pay | Admitting: Orthopaedic Surgery

## 2018-11-08 ENCOUNTER — Other Ambulatory Visit: Payer: Self-pay

## 2018-11-08 VITALS — BP 166/85 | HR 100 | Temp 98.4°F | Ht 66.0 in | Wt 149.0 lb

## 2018-11-08 DIAGNOSIS — M25511 Pain in right shoulder: Secondary | ICD-10-CM | POA: Diagnosis not present

## 2018-11-08 DIAGNOSIS — G8929 Other chronic pain: Secondary | ICD-10-CM | POA: Diagnosis not present

## 2018-11-08 DIAGNOSIS — F17210 Nicotine dependence, cigarettes, uncomplicated: Secondary | ICD-10-CM | POA: Diagnosis not present

## 2018-11-08 NOTE — Patient Instructions (Signed)

## 2018-11-08 NOTE — Progress Notes (Signed)
Subjective:    Patient ID: [name], female    DOB: 11/30/1945, 73 y.o.   MRN: [id number]  HPI  She has had pain in the right shoulder for a few months getting worse.  She went to the ER 11-03-2018.  I have reviewed the notes.  She has pain with overhead use and now cannot raise her arm upwards. She has no numbness, no redness, no swelling. Advil, heat and Ice have not helped.  She is on chronic pain medicine also.  She has no trauma.    Review of Systems  Constitutional: Positive for activity change.  Musculoskeletal: Positive for arthralgias and back pain.  All other systems reviewed and are negative.  For Review of Systems, all other systems reviewed and are negative.  The following is a summary of the past history medically, past history surgically, known current medicines, social history and family history.  This information is gathered electronically by the computer from prior information and documentation.  I review this each visit and have found including this information at this point in the chart is beneficial and informative.   Past Medical History:  Diagnosis Date  . Arthritis   . Chronic back pain   . GERD (gastroesophageal reflux disease)   . Hypertension   . Thyroid disease   . Ulcer    [place]roth I    Past Surgical History:  Procedure Laterality Date  . ABDOMINAL HYSTERECTOMY    . BACK SURGERY     X4  . BILROTH I PROCEDURE    . CHOLECYSTECTOMY    . COLONOSCOPY   05/18/2003   [id number] colonoscopy/ Internal hemorrhoids.  Otherwise, normal rectum  . COLONOSCOPY  08/12/2010   Dr. Arnoldo Morale: cecum visualized and normal, colon and rectum normal. Torturous colon  . COLONOSCOPY N/A 09/07/2012   RMR: tubular adenoma, lipoma. Due for surveillance in 2021  . ESOPHAGOGASTRODUODENOSCOPY  05/18/2003   RMR:. Normal esophagus/Adenomatous-appearing mucosa at the anastomosis  . ESOPHAGOGASTRODUODENOSCOPY  08/12/2010   Dr. Arnoldo Morale: anastomosis widely patent,  no ulcerations, CLO test negative  . RIGHT HEART CATH N/A 05/04/2018   Procedure: RIGHT HEART CATH;  Surgeon: Larey Dresser, MD;  Location: [place] [facility];  Service: Cardiovascular;  Laterality: N/A;  . STOMACH SURGERY     removed partial stomach  . YAG LASER APPLICATION Left 01/00/7121   Procedure: YAG LASER APPLICATION;  Surgeon: [name], MD;  Location: [facility];  Service: Ophthalmology;  Laterality: Left;  . YAG LASER APPLICATION Right 97/58/8325   Procedure: YAG LASER APPLICATION;  Surgeon: [name], MD;  Location: [facility];  Service: Ophthalmology;  Laterality: Right;    Current Outpatient Medications on File Prior to Visit  Medication Sig Dispense Refill  . albuterol (VENTOLIN HFA) 108 (90 Base) MCG/ACT inhaler Inhale 2 puffs into the lungs every 4 (four) hours as needed for wheezing or shortness of breath. 1 Inhaler 1  . clonazePAM (KLONOPIN) 1 MG tablet Take 1 mg by mouth 4 (four) times daily as needed (For nerves).     Marland Kitchen escitalopram (LEXAPRO) 10 MG tablet Take 1 tablet by mouth daily.    . furosemide (LASIX) 40 MG tablet Take 0.5 tablets (20 mg total) by mouth every other day. 15 tablet 6  . gabapentin (NEURONTIN) 300 MG capsule Take 300 mg by mouth 3 (three) times daily.    Marland Kitchen HYDROcodone-acetaminophen (NORCO) 10-325 MG tablet Take 1 tablet by mouth every 4 (four) hours as needed (pain).     Marland Kitchen  ibuprofen (ADVIL,MOTRIN) 200 MG tablet Take 400 mg by mouth 2 (two) times daily as needed (pain).    . macitentan (OPSUMIT) 10 MG tablet Take 1 tablet (10 mg total) by mouth daily. (Patient not taking: Reported on 11/08/2018) 30 tablet 3  . pantoprazole (PROTONIX) 40 MG tablet Take 40 mg by mouth at bedtime.     Marland Kitchen spironolactone (ALDACTONE) 25 MG tablet Take 1 tablet (25 mg total) by mouth daily.    . tadalafil, PAH, (ADCIRCA) 20 MG tablet Take 1 tablet (20 mg total) by mouth daily. 30 tablet 6   No current facility-administered medications on file prior to visit.     Social  History   Socioeconomic History  . Marital status: Married    Spouse name: Not on file  . Number of children: Not on file  . Years of education: Not on file  . Highest education level: Not on file  Occupational History  . Not on file  Social Needs  . Financial resource strain: Not on file  . Food insecurity    Worry: Not on file    Inability: Not on file  . Transportation needs    Medical: Not on file    Non-medical: Not on file  Tobacco Use  . Smoking status: Current Every Day Smoker    Packs/day: 0.30    Years: 15.00    Pack years: 4.50    Types: Cigarettes  . Smokeless tobacco: Never Used  Substance and Sexual Activity  . Alcohol use: No  . Drug use: No  . Sexual activity: Not on file  Lifestyle  . Physical activity    Days per week: Not on file    Minutes per session: Not on file  . Stress: Not on file  Relationships  . Social Herbalist on phone: Not on file    Gets together: Not on file    Attends religious service: Not on file    Active member of club or organization: Not on file    Attends meetings of clubs or organizations: Not on file    Relationship status: Not on file  . Intimate partner violence    Fear of current or ex partner: Not on file    Emotionally abused: Not on file    Physically abused: Not on file    Forced sexual activity: Not on file  Other Topics Concern  . Not on file  Social History Narrative  . Not on file    Family History  Problem Relation Age of Onset  . Cancer Sister   . Cancer Brother   . Cancer Maternal Aunt   . Colon cancer Neg Hx     BP (!) 166/85   Pulse 100   Temp 98.4 F (36.9 C)   Ht _0  (1.676 m)   Wt 149 lb (67.6 kg)   BMI 24.05 kg/m   Body mass index is 24.05 kg/m.     Objective:   Physical Exam Vitals signs reviewed.  Constitutional:      Appearance: She is well-developed.  HENT:     Head: Normocephalic and atraumatic.  Eyes:     Conjunctiva/sclera: Conjunctivae normal.      Pupils: Pupils are equal, round, and reactive to light.  Neck:     Musculoskeletal: Normal range of motion and neck supple.  Cardiovascular:     Rate and Rhythm: Normal rate and regular rhythm.  Pulmonary:     Effort: Pulmonary effort is normal.  Abdominal:     Palpations: Abdomen is soft.  Musculoskeletal:     Right shoulder: She exhibits decreased range of motion.       Arms:  Skin:    General: Skin is warm and dry.  Neurological:     Mental Status: She is alert and oriented to person, place, and time.     Cranial Nerves: No cranial nerve deficit.     Motor: No abnormal muscle tone.     Coordination: Coordination normal.     Deep Tendon Reflexes: Reflexes are normal and symmetric. Reflexes normal.  Psychiatric:        Behavior: Behavior normal.        Thought Content: Thought content normal.        Judgment: Judgment normal.           Assessment & Plan:   Encounter Diagnoses  Name Primary?  . Chronic right shoulder pain Yes  . Nicotine dependence, cigarettes, uncomplicated    PROCEDURE NOTE:  The patient request injection, verbal consent was obtained.  The right shoulder was prepped appropriately after time out was performed.   Sterile technique was observed and injection of 1 cc of Depo-Medrol 40 mg with several cc's of plain xylocaine. Anesthesia was provided by ethyl chloride and a 20-gauge needle was used to inject the shoulder area. A posterior approach was used.  The injection was tolerated well.  A band aid dressing was applied.  The patient was advised to apply ice later today and tomorrow to the injection sight as needed.  Continue her pain medicine.  Return in two weeks.  Call if any problem.  Precautions discussed.   Electronically Signed Sanjuana Kava, MD 8/4/20208:31 AM

## 2018-11-22 ENCOUNTER — Ambulatory Visit: Payer: Medicare Other | Admitting: Orthopaedic Surgery

## 2018-11-24 ENCOUNTER — Encounter: Payer: Self-pay | Admitting: Orthopaedic Surgery

## 2018-12-09 ENCOUNTER — Ambulatory Visit (HOSPITAL_COMMUNITY)
Admission: RE | Admit: 2018-12-09 | Discharge: 2018-12-09 | Disposition: A | Discharge status: Home / Self Care | Payer: Medicare Other | Source: Ambulatory Visit | Attending: Cardiology | Admitting: Cardiology

## 2018-12-09 ENCOUNTER — Encounter (HOSPITAL_COMMUNITY): Payer: Self-pay | Admitting: Cardiology

## 2018-12-09 ENCOUNTER — Other Ambulatory Visit: Payer: Self-pay

## 2018-12-09 ENCOUNTER — Telehealth (HOSPITAL_COMMUNITY): Payer: Self-pay

## 2018-12-09 VITALS — BP 164/81 | HR 81 | Wt 142.4 lb

## 2018-12-09 DIAGNOSIS — Z9049 Acquired absence of other specified parts of digestive tract: Secondary | ICD-10-CM | POA: Insufficient documentation

## 2018-12-09 DIAGNOSIS — M1990 Unspecified osteoarthritis, unspecified site: Secondary | ICD-10-CM | POA: Insufficient documentation

## 2018-12-09 DIAGNOSIS — I272 Pulmonary hypertension, unspecified: Secondary | ICD-10-CM

## 2018-12-09 DIAGNOSIS — J449 Chronic obstructive pulmonary disease, unspecified: Secondary | ICD-10-CM | POA: Insufficient documentation

## 2018-12-09 DIAGNOSIS — F17210 Nicotine dependence, cigarettes, uncomplicated: Secondary | ICD-10-CM | POA: Diagnosis not present

## 2018-12-09 DIAGNOSIS — Z79899 Other long term (current) drug therapy: Secondary | ICD-10-CM | POA: Insufficient documentation

## 2018-12-09 DIAGNOSIS — I110 Hypertensive heart disease with heart failure: Secondary | ICD-10-CM | POA: Insufficient documentation

## 2018-12-09 DIAGNOSIS — I50810 Right heart failure, unspecified: Secondary | ICD-10-CM | POA: Diagnosis not present

## 2018-12-09 DIAGNOSIS — I5032 Chronic diastolic (congestive) heart failure: Secondary | ICD-10-CM

## 2018-12-09 LAB — BASIC METABOLIC PANEL
Anion gap: 10 (ref 5–15)
BUN: 11 mg/dL (ref 8–23)
CO2: 24 mmol/L (ref 22–32)
Calcium: 9.4 mg/dL (ref 8.9–10.3)
Chloride: 92 mmol/L — ABNORMAL LOW (ref 98–111)
Creatinine, Ser: 0.76 mg/dL (ref 0.44–1.00)
GFR calc Af Amer: >60 mL/min (ref >60)
GFR calc non Af Amer: >60 mL/min (ref >60)
Glucose, Bld: 87 mg/dL (ref 70–99)
Potassium: 5.3 mmol/L — ABNORMAL HIGH (ref 3.5–5.1)
Sodium: 126 mmol/L — ABNORMAL LOW (ref 135–145)

## 2018-12-09 MED ORDER — AMLODIPINE BESYLATE 2.5 MG PO TABS: 2.5000 mg | ORAL_TABLET | Freq: Every day | ORAL | 3 refills | Status: DC

## 2018-12-09 MED ORDER — TADALAFIL (PAH) 20 MG PO TABS: 40.0000 mg | ORAL_TABLET | Freq: Every day | ORAL | 6 refills | Status: DC

## 2018-12-09 NOTE — Patient Instructions (Signed)
INCREASE Tadalafil to 67m (2 tabs) daily  START Amlodipine 2.558m(1 tab) daily  Labs today We will only contact you if something comes back abnormal or we need to make some changes. Otherwise no news is good news!  Your physician recommends that you schedule a follow-up appointment in: 2 months with Dr McAundra Dubin .At the [facility] and your health needs are our priority. As part of our continuing mission to provide you with exceptional heart care, we have created designated Provider Care Teams. These Care Teams include your primary Cardiologist (physician) and Advanced Practice Providers (APPs- Physician Assistants and Nurse Practitioners) who all work together to provide you with the care you need, when you need it.   You may see any of the following providers on your designated Care Team at your next follow up: . Marland Kitchenr DaGlori Bickers Dr DaLoralie Champagne AmDarrick GrinderNP   Please be sure to bring in all your medications bottles to every appointment.

## 2018-12-09 NOTE — Telephone Encounter (Signed)
Pt aware of results. Verbalized understanding of instructions for dietary restrictions. Pt reports she has been eating a lot of bananas. Advised to cut back and monitor all foods for potassium. rtc in 10 days for bmet

## 2018-12-09 NOTE — Progress Notes (Signed)
6 minute walk. Pt ambulated total of 304 meters.  Tolerated well. HR range 84-85. O2 started at 95%.  Dropped to as low as 88 and recovered to 92 during walk. Denied dizziness or SOB

## 2018-12-09 NOTE — Telephone Encounter (Signed)
-----  Message from Larey Dresser, MD sent at 12/09/2018  2:17 PM EDT ----- She is on spironolactone.  Needs low K diet.  Na is low.  Cut back on po fluid intake (<2L/day). BMET 10 days.

## 2018-12-11 NOTE — Progress Notes (Signed)
PCP: Dr. Karie Kirks Cardiology: Dr. Harrington Challenger HF Cardiology: Dr. Aundra Dubin  73 yo smoker with COPD and HTN presents for followup of pulmonary hypertension.  Echo in 1/20 showed severe RV dilation and dysfunction.  RHC in 1/20 showed moderate pulmonary hypertension with elevated PVR and low cardiac output.  PCWP was normal.  PFTs showed moderate-severe obstruction.  V/Q scan showed no chronic PE. PFTs showed moderate to severe obstruction/COPD.    She was started on Opsumit given concern for pulmonary hypertension out of proportion to lung disease.    Echo in 7/20 showed EF 60-65% with moderate RV dilation and mildly decreased systolic function, D-shaped septum, mild-moderate TR, PASP down to 45 mmHg (76 mmHg on last echo).  She is no longer taking Opsumit due to side effects but is on tadalafil 20 mg daily.  She feels good, not using oxygen.  Oxygen saturation ok in office today.  No dyspnea walking in house or walking into office today.  No chest pain. +Low back pain.  No edema.  BP has been elevated recently.  She has not been using Lasix.   6 minute walk (2/20): 298 m 6 minute walk (7/20): 152 m 6 minute walk (9/20): 304 m  Labs (1/20): K 4, creatinine 0.8, TSH normal, BNP 994 Labs (2/20): HIV negative, SCL-70 negative, centromere Ab negative, RF negative Labs (5/20): BNP 140, ANA negative, K 3.9, creatinine 0.74 Labs (7/20): K 4.1, creatinine 0.77  PMH: 1. HTN 2. COPD: Active smoker.  - PFTs (2/20): FVC 51%, FEV1 57%, ratio 72%, TLC 93%, DLCO 51% => moderate-severe obstructive disease.  3. H/o cholecystectomy 4. OA 5. H/o back surgery 6. Pulmonary hypertension:  - Echo (1/20): EF 60-65%, mild LVH, severe RV dilation with severely decreased RV systolic function, PASP 76 mmHg.  - RHC (1/20): mean RA 10, PA 55/21 mean 36, mean PCWP 7, CI 1.6, PVR 10 WU.  - V/Q scan (2/20): Not suggestive of chronic PE.  - CTA chest (1/20): No PE, no evidence for ILD.  - PFTs (2/20): Moderate to severe  obstructive lung disease. - Echo (7/20): EF 60-65%, with moderate RV dilation and mildly decreased systolic function, D-shaped septum, mild-moderate TR, PASP down to 45 mmHg (76 mmHg on last echo) - Unable to tolerate Opsumit.   Family History  Problem Relation Age of Onset  . Cancer Sister   . Cancer Brother   . Cancer Maternal Aunt   . Colon cancer Neg Hx    Social History   Socioeconomic History  . Marital status: Married    Spouse name: Not on file  . Number of children: Not on file  . Years of education: Not on file  . Highest education level: Not on file  Occupational History  . Not on file  Social Needs  . Financial resource strain: Not on file  . Food insecurity    Worry: Not on file    Inability: Not on file  . Transportation needs    Medical: Not on file    Non-medical: Not on file  Tobacco Use  . Smoking status: Current Every Day Smoker    Packs/day: 0.30    Years: 15.00    Pack years: 4.50    Types: Cigarettes  . Smokeless tobacco: Never Used  Substance and Sexual Activity  . Alcohol use: No  . Drug use: No  . Sexual activity: Not on file  Lifestyle  . Physical activity    Days per week: Not on file  Minutes per session: Not on file  . Stress: Not on file  Relationships  . Social Herbalist on phone: Not on file    Gets together: Not on file    Attends religious service: Not on file    Active member of club or organization: Not on file    Attends meetings of clubs or organizations: Not on file    Relationship status: Not on file  . Intimate partner violence    Fear of current or ex partner: Not on file    Emotionally abused: Not on file    Physically abused: Not on file    Forced sexual activity: Not on file  Other Topics Concern  . Not on file  Social History Narrative  . Not on file   ROS: All systems reviewed and negative except as per HPI.   Current Outpatient Medications  Medication Sig Dispense Refill  . albuterol  (VENTOLIN HFA) 108 (90 Base) MCG/ACT inhaler Inhale 2 puffs into the lungs every 4 (four) hours as needed for wheezing or shortness of breath. 1 Inhaler 1  . clonazePAM (KLONOPIN) 1 MG tablet Take 1 mg by mouth 4 (four) times daily as needed (For nerves).     Marland Kitchen escitalopram (LEXAPRO) 10 MG tablet Take 1 tablet by mouth daily.    Marland Kitchen gabapentin (NEURONTIN) 300 MG capsule Take 300 mg by mouth 3 (three) times daily.    Marland Kitchen HYDROcodone-acetaminophen (NORCO) 10-325 MG tablet Take 1 tablet by mouth every 4 (four) hours as needed (pain).     Marland Kitchen ibuprofen (ADVIL,MOTRIN) 200 MG tablet Take 400 mg by mouth 2 (two) times daily as needed (pain).    . pantoprazole (PROTONIX) 40 MG tablet Take 40 mg by mouth at bedtime.     Marland Kitchen spironolactone (ALDACTONE) 25 MG tablet Take 1 tablet (25 mg total) by mouth daily.    . tadalafil, PAH, (ADCIRCA) 20 MG tablet Take 2 tablets (40 mg total) by mouth daily. 60 tablet 6  . amLODipine (NORVASC) 2.5 MG tablet Take 1 tablet (2.5 mg total) by mouth daily. 180 tablet 3   No current facility-administered medications for this encounter.    BP (!) 164/81   Pulse 81   Wt 64.6 kg (142 lb 6.4 oz)   SpO2 91%   BMI 22.98 kg/m  General: NAD Neck: No JVD, no thyromegaly or thyroid nodule.  Lungs: Clear to auscultation bilaterally with normal respiratory effort. CV: Nondisplaced PMI.  Heart regular S1/S2, no S3/S4, no murmur.  No peripheral edema.  No carotid bruit.  Normal pedal pulses.  Abdomen: Soft, nontender, no hepatosplenomegaly, no distention.  Skin: Intact without lesions or rashes.  Neurologic: Alert and oriented x 3.  Psych: Normal affect. Extremities: No clubbing or cyanosis.  HEENT: Normal.   Assessment/Plan: 1. Pulmonary hypertension: Echo 1/20 with severe RV dilation/dysfunction.  RHC with moderate PH, high PVR, normal PCWP, low cardiac output.  No evidence for chronic PE by V/Q scan.  PFTs show a moderate to severe obstructive defect. Rheumatological serologies  negative. She still smokes.  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  However, cannot rule out a group 1 component as elevated PVR seems out of proportion to lung disease.  She says that her breathing is better on tadalafil. Improved 6 minute walk today.  - Increase tadalafil to 40 mg daily.  - Unable to tolerate Opsumit, can try ambrisentan as next step. 2. Chronic diastolic CHF/RV failure: She does not  appear volume overloaded on exam.   - She can continue to use Lasix prn. BMET today.   3. COPD/smoking: She still smokes 5 cigs/day.  She did not tolerate Chantix and Wellbutrin did not work.  - She is still working on quitting.  4. HTN: BP has been elevated.  - Start amlodipine 2.5 mg daily.   Followup in 2 months.   Loralie Champagne 12/11/2018

## 2018-12-19 ENCOUNTER — Other Ambulatory Visit: Payer: Self-pay

## 2018-12-19 ENCOUNTER — Ambulatory Visit (HOSPITAL_COMMUNITY)
Admission: RE | Admit: 2018-12-19 | Discharge: 2018-12-19 | Disposition: A | Discharge status: Home / Self Care | Payer: Medicare Other | Source: Ambulatory Visit | Attending: Internal Medicine | Admitting: Internal Medicine

## 2018-12-19 DIAGNOSIS — I272 Pulmonary hypertension, unspecified: Secondary | ICD-10-CM | POA: Diagnosis present

## 2018-12-19 LAB — BASIC METABOLIC PANEL
Anion gap: 11 (ref 5–15)
BUN: 14 mg/dL (ref 8–23)
CO2: 26 mmol/L (ref 22–32)
Calcium: 9.2 mg/dL (ref 8.9–10.3)
Chloride: 100 mmol/L (ref 98–111)
Creatinine, Ser: 0.79 mg/dL (ref 0.44–1.00)
GFR calc Af Amer: >60 mL/min (ref >60)
GFR calc non Af Amer: >60 mL/min (ref >60)
Glucose, Bld: 107 mg/dL — ABNORMAL HIGH (ref 70–99)
Potassium: 4.1 mmol/L (ref 3.5–5.1)
Sodium: 137 mmol/L (ref 135–145)

## 2018-12-20 ENCOUNTER — Ambulatory Visit (INDEPENDENT_AMBULATORY_CARE_PROVIDER_SITE_OTHER): Payer: Medicare Other | Admitting: Orthopaedic Surgery

## 2018-12-20 ENCOUNTER — Encounter: Payer: Self-pay | Admitting: Orthopaedic Surgery

## 2018-12-20 VITALS — BP 159/76 | HR 82 | Temp 96.6°F | Ht 65.0 in | Wt 141.0 lb

## 2018-12-20 DIAGNOSIS — M542 Cervicalgia: Secondary | ICD-10-CM | POA: Diagnosis not present

## 2018-12-20 DIAGNOSIS — G8929 Other chronic pain: Secondary | ICD-10-CM

## 2018-12-20 DIAGNOSIS — M25511 Pain in right shoulder: Secondary | ICD-10-CM

## 2018-12-20 DIAGNOSIS — F17210 Nicotine dependence, cigarettes, uncomplicated: Secondary | ICD-10-CM | POA: Diagnosis not present

## 2018-12-20 NOTE — Progress Notes (Signed)
Patient [name] [name], female DOB:Jul 26, 1945, 73 y.o. EHM:[id number]  Chief Complaint  Patient presents with  . Shoulder Pain    Right chronic shoulder pain.    HPI  [name] is a 73 y.o. female who has had right shoulder pain.  Her shoulder is better but her right side of the neck is bothering her now.  She has no paresthesias,no spasm.  I have advised using Aspercreme to the area.  She is already on Neurontin and pain medicine.   Body mass index is 23.46 kg/m.  ROS  Review of Systems  Constitutional: Positive for activity change.  Musculoskeletal: Positive for arthralgias and back pain.  All other systems reviewed and are negative.   All other systems reviewed and are negative.  The following is a summary of the past history medically, past history surgically, known current medicines, social history and family history.  This information is gathered electronically by the computer from prior information and documentation.  I review this each visit and have found including this information at this point in the chart is beneficial and informative.    Past Medical History:  Diagnosis Date  . Arthritis   . Chronic back pain   . GERD (gastroesophageal reflux disease)   . Hypertension   . Thyroid disease   . Ulcer    [place] I    Past Surgical History:  Procedure Laterality Date  . ABDOMINAL HYSTERECTOMY    . BACK SURGERY     X4  . BILROTH I PROCEDURE    . CHOLECYSTECTOMY    . COLONOSCOPY   05/18/2003   [id number] colonoscopy/ Internal hemorrhoids.  Otherwise, normal rectum  . COLONOSCOPY  08/12/2010   Dr. Arnoldo Morale: cecum visualized and normal, colon and rectum normal. Torturous colon  . COLONOSCOPY N/A 09/07/2012   RMR: tubular adenoma, lipoma. Due for surveillance in 2021  . ESOPHAGOGASTRODUODENOSCOPY  05/18/2003   RMR:. Normal esophagus/Adenomatous-appearing mucosa at the anastomosis  . ESOPHAGOGASTRODUODENOSCOPY  08/12/2010   Dr.  Arnoldo Morale: anastomosis widely patent, no ulcerations, CLO test negative  . RIGHT HEART CATH N/A 05/04/2018   Procedure: RIGHT HEART CATH;  Surgeon: Larey Dresser, MD;  Location: [facility];  Service: Cardiovascular;  Laterality: N/A;  . STOMACH SURGERY     removed partial stomach  . YAG LASER APPLICATION Left 35/46/5681   Procedure: YAG LASER APPLICATION;  Surgeon: [name], MD;  Location: [facility];  Service: Ophthalmology;  Laterality: Left;  . YAG LASER APPLICATION Right 27/51/7001   Procedure: YAG LASER APPLICATION;  Surgeon: [name], MD;  Location: [facility];  Service: Ophthalmology;  Laterality: Right;    Family History  Problem Relation Age of Onset  . Cancer Sister   . Cancer Brother   . Cancer Maternal Aunt   . Colon cancer Neg Hx     Social History Social History   Tobacco Use  . Smoking status: Current Every Day Smoker    Packs/day: 0.30    Years: 15.00    Pack years: 4.50    Types: Cigarettes  . Smokeless tobacco: Never Used  Substance Use Topics  . Alcohol use: No  . Drug use: No    Allergies  Allergen Reactions  . Chantix [Varenicline] Nausea And Vomiting  . Codeine Other (See Comments)    hallucinations  . Oxycodone Palpitations    Current Outpatient Medications  Medication Sig Dispense Refill  . albuterol (VENTOLIN HFA) 108 (90 Base) MCG/ACT inhaler Inhale 2 puffs into the lungs every  4 (four) hours as needed for wheezing or shortness of breath. 1 Inhaler 1  . amLODipine (NORVASC) 2.5 MG tablet Take 1 tablet (2.5 mg total) by mouth daily. 180 tablet 3  . clonazePAM (KLONOPIN) 1 MG tablet Take 1 mg by mouth 4 (four) times daily as needed (For nerves).     Marland Kitchen escitalopram (LEXAPRO) 10 MG tablet Take 1 tablet by mouth daily.    Marland Kitchen gabapentin (NEURONTIN) 300 MG capsule Take 300 mg by mouth 3 (three) times daily.    Marland Kitchen HYDROcodone-acetaminophen (NORCO) 10-325 MG tablet Take 1 tablet by mouth every 4 (four) hours as needed (pain).     Marland Kitchen ibuprofen  (ADVIL,MOTRIN) 200 MG tablet Take 400 mg by mouth 2 (two) times daily as needed (pain).    . pantoprazole (PROTONIX) 40 MG tablet Take 40 mg by mouth at bedtime.     Marland Kitchen spironolactone (ALDACTONE) 25 MG tablet Take 1 tablet (25 mg total) by mouth daily.    . tadalafil, PAH, (ADCIRCA) 20 MG tablet Take 2 tablets (40 mg total) by mouth daily. 60 tablet 6   No current facility-administered medications for this visit.      Physical Exam  Blood pressure (!) 159/76, pulse 82, temperature (!) 96.6 F (35.9 C), height _0  (1.651 m), weight 141 lb (64 kg).  Constitutional: overall normal hygiene, normal nutrition, well developed, normal grooming, normal body habitus. Assistive device:none  Musculoskeletal: gait and station Limp none, muscle tone and strength are normal, no tremors or atrophy is present.  .  Neurological: coordination overall normal.  Deep tendon reflex/nerve stretch intact.  Sensation normal.  Cranial nerves II-XII intact.   Skin:   Normal overall no scars, lesions, ulcers or rashes. No psoriasis.  Psychiatric: Alert and oriented x 3.  Recent memory intact, remote memory unclear.  Normal mood and affect. Well groomed.  Good eye contact.  Cardiovascular: overall no swelling, no varicosities, no edema bilaterally, normal temperatures of the legs and arms, no clubbing, cyanosis and good capillary refill.  Lymphatic: palpation is normal.  Shoulder is nearly full ROM but has pain in the extremes.  ROM of neck is full. Reflexes normal.  All other systems reviewed and are negative   The patient has been educated about the nature of the problem(s) and counseled on treatment options.  The patient appeared to understand what I have discussed and is in agreement with it.  Encounter Diagnoses  Name Primary?  . Neck pain Yes  . Nicotine dependence, cigarettes, uncomplicated   . Chronic right shoulder pain     PLAN Call if any problems.  Precautions discussed.  Continue current  medications.   Return to clinic 1 month   Electronically Signed Sanjuana Kava, MD 9/15/20209:32 AM

## 2018-12-27 ENCOUNTER — Other Ambulatory Visit (HOSPITAL_COMMUNITY): Payer: Self-pay | Admitting: *Deleted

## 2018-12-27 MED ORDER — TADALAFIL (PAH) 20 MG PO TABS: 40.0000 mg | ORAL_TABLET | Freq: Every day | ORAL | 6 refills | Status: DC

## 2018-12-28 ENCOUNTER — Telehealth (HOSPITAL_COMMUNITY): Payer: Self-pay | Admitting: Pharmacy Technician

## 2018-12-28 NOTE — Telephone Encounter (Signed)
Patient Advocate Encounter   Received notification from [employer] that prior authorization for Tadalafil is required.  Ran a test claim and patient has a co-pay of $14.07, no co-pay at this time.  Charlann Boxer, CPhT

## 2019-01-10 ENCOUNTER — Telehealth (HOSPITAL_COMMUNITY): Payer: Self-pay | Admitting: Pharmacy Technician

## 2019-01-17 ENCOUNTER — Other Ambulatory Visit: Payer: Self-pay

## 2019-01-17 ENCOUNTER — Ambulatory Visit (INDEPENDENT_AMBULATORY_CARE_PROVIDER_SITE_OTHER): Payer: Medicare Other | Admitting: Orthopaedic Surgery

## 2019-01-17 ENCOUNTER — Encounter: Payer: Self-pay | Admitting: Orthopaedic Surgery

## 2019-01-17 VITALS — BP 122/70 | HR 95 | Ht 65.0 in | Wt 140.0 lb

## 2019-01-17 DIAGNOSIS — M25511 Pain in right shoulder: Secondary | ICD-10-CM | POA: Diagnosis not present

## 2019-01-17 DIAGNOSIS — G8929 Other chronic pain: Secondary | ICD-10-CM | POA: Diagnosis not present

## 2019-01-17 DIAGNOSIS — F17210 Nicotine dependence, cigarettes, uncomplicated: Secondary | ICD-10-CM

## 2019-01-17 NOTE — Progress Notes (Signed)
CC:  I am much better  Her right shoulder has full motion today and no pain.  She is doing well.  NV intact.  Encounter Diagnoses  Name Primary?  . Chronic right shoulder pain Yes  . Nicotine dependence, cigarettes, uncomplicated    Return as needed.  Call if any problem.  Precautions discussed.   Electronically Signed Sanjuana Kava, MD 10/13/20209:32 AM

## 2019-01-26 NOTE — Telephone Encounter (Signed)
Opened in error

## 2019-02-07 ENCOUNTER — Other Ambulatory Visit: Payer: Self-pay | Admitting: Nurse Practitioner

## 2019-02-07 DIAGNOSIS — M5136 Other intervertebral disc degeneration, lumbar region: Secondary | ICD-10-CM

## 2019-02-09 ENCOUNTER — Ambulatory Visit
Admission: RE | Admit: 2019-02-09 | Discharge: 2019-02-09 | Disposition: A | Discharge status: Home / Self Care | Payer: Medicare Other | Source: Ambulatory Visit | Attending: Nurse Practitioner | Admitting: Nurse Practitioner

## 2019-02-09 ENCOUNTER — Other Ambulatory Visit: Payer: Self-pay

## 2019-02-09 DIAGNOSIS — M5136 Other intervertebral disc degeneration, lumbar region: Secondary | ICD-10-CM

## 2019-02-09 MED ORDER — IOPAMIDOL (ISOVUE-M 200) INJECTION 41%
1.0000 mL | Freq: Once | INTRAMUSCULAR | Status: AC
  Administered 2019-02-09: 08:00:00 1 mL via EPIDURAL

## 2019-02-09 MED ORDER — METHYLPREDNISOLONE ACETATE 40 MG/ML INJ SUSP (RADIOLOG
120.0000 mg | Freq: Once | INTRAMUSCULAR | Status: AC
  Administered 2019-02-09: 120 mg via EPIDURAL

## 2019-02-09 NOTE — Discharge Instructions (Signed)

## 2019-02-17 ENCOUNTER — Encounter (HOSPITAL_COMMUNITY): Payer: Medicare Other | Admitting: Cardiology

## 2019-02-23 ENCOUNTER — Other Ambulatory Visit: Payer: Self-pay

## 2019-02-23 ENCOUNTER — Ambulatory Visit (HOSPITAL_COMMUNITY)
Admission: RE | Admit: 2019-02-23 | Discharge: 2019-02-23 | Disposition: A | Discharge status: Home / Self Care | Payer: Medicare Other | Source: Ambulatory Visit | Attending: Cardiology | Admitting: Cardiology

## 2019-02-23 VITALS — BP 145/68 | HR 96 | Wt 143.6 lb

## 2019-02-23 DIAGNOSIS — J449 Chronic obstructive pulmonary disease, unspecified: Secondary | ICD-10-CM | POA: Insufficient documentation

## 2019-02-23 DIAGNOSIS — I110 Hypertensive heart disease with heart failure: Secondary | ICD-10-CM | POA: Diagnosis not present

## 2019-02-23 DIAGNOSIS — I50810 Right heart failure, unspecified: Secondary | ICD-10-CM

## 2019-02-23 DIAGNOSIS — I272 Pulmonary hypertension, unspecified: Secondary | ICD-10-CM | POA: Insufficient documentation

## 2019-02-23 DIAGNOSIS — Z809 Family history of malignant neoplasm, unspecified: Secondary | ICD-10-CM | POA: Diagnosis not present

## 2019-02-23 DIAGNOSIS — M1990 Unspecified osteoarthritis, unspecified site: Secondary | ICD-10-CM | POA: Diagnosis not present

## 2019-02-23 DIAGNOSIS — F17210 Nicotine dependence, cigarettes, uncomplicated: Secondary | ICD-10-CM | POA: Diagnosis not present

## 2019-02-23 DIAGNOSIS — Z9049 Acquired absence of other specified parts of digestive tract: Secondary | ICD-10-CM | POA: Insufficient documentation

## 2019-02-23 DIAGNOSIS — J441 Chronic obstructive pulmonary disease with (acute) exacerbation: Secondary | ICD-10-CM | POA: Diagnosis not present

## 2019-02-23 DIAGNOSIS — I5032 Chronic diastolic (congestive) heart failure: Secondary | ICD-10-CM | POA: Diagnosis not present

## 2019-02-23 DIAGNOSIS — Z79899 Other long term (current) drug therapy: Secondary | ICD-10-CM | POA: Diagnosis not present

## 2019-02-23 MED ORDER — TADALAFIL (PAH) 20 MG PO TABS: 40.0000 mg | ORAL_TABLET | Freq: Every day | ORAL | 5 refills | Status: DC

## 2019-02-23 MED ORDER — AMLODIPINE BESYLATE 5 MG PO TABS: 5.0000 mg | ORAL_TABLET | Freq: Every day | ORAL | 3 refills | Status: DC

## 2019-02-23 NOTE — Progress Notes (Signed)
SATURATION QUALIFICATIONS: (This note is used to comply with regulatory documentation for home oxygen)  Patient Saturations on Room Air at Rest = 91%  Patient Saturations on Room Air while Ambulating = 82%  Patient Saturations on 2 Liters of oxygen while Ambulating = 94%  Please briefly explain why patient needs home oxygen: due to patients oxygen dropping down to 82% while ambulating patient needs to start wearing oxygen nightly and upon exertion

## 2019-02-23 NOTE — Progress Notes (Signed)
PCP: Dr. Karie Kirks Cardiology: Dr. Harrington Challenger HF Cardiology: Dr. Aundra Dubin  73 y.o. smoker with COPD and HTN presents for followup of pulmonary hypertension.  Echo in 1/20 showed severe RV dilation and dysfunction.  RHC in 1/20 showed moderate pulmonary hypertension with elevated PVR and low cardiac output.  PCWP was normal.  PFTs showed moderate-severe obstruction.  V/Q scan showed no chronic PE. PFTs showed moderate to severe obstruction/COPD.    She was started on Opsumit given concern for pulmonary hypertension out of proportion to lung disease.  She had to stop this due to side effects.  She started on tadalafil and did well on it with improved symptoms.   Echo in 7/20 showed EF 60-65% with moderate RV dilation and mildly decreased systolic function, D-shaped septum, mild-moderate TR, PASP down to 45 mmHg (76 mmHg on last echo).  She has stopped taking tadalafil, saying that when she read the insert for the medication that it said it was for erectile dysfunction, was worried she had been given the wrong medicine by her pharmacist.  Breathing is stable.  She is hypoxemic with exertion (drops to 82% on RA with exertion) but denies dyspnea walking on flat ground.  No lightheadedness/syncope.  No chest pain.  She has oxygen at home but does not use it because it is not portable. Weight is stable.  BP is elevated.  She is still smoking.   6 minute walk (2/20): 298 m 6 minute walk (7/20): 152 m 6 minute walk (9/20): 304 m  Labs (1/20): K 4, creatinine 0.8, TSH normal, BNP 994 Labs (2/20): HIV negative, SCL-70 negative, centromere Ab negative, RF negative Labs (5/20): BNP 140, ANA negative, K 3.9, creatinine 0.74 Labs (7/20): K 4.1, creatinine 0.77 Labs (9/20): K 4.1, creatinine 0.79  PMH: 1. HTN 2. COPD: Active smoker.  - PFTs (2/20): FVC 51%, FEV1 57%, ratio 72%, TLC 93%, DLCO 51% => moderate-severe obstructive disease.  3. H/o cholecystectomy 4. OA 5. H/o back surgery 6. Pulmonary hypertension:   - Echo (1/20): EF 60-65%, mild LVH, severe RV dilation with severely decreased RV systolic function, PASP 76 mmHg.  - RHC (1/20): mean RA 10, PA 55/21 mean 36, mean PCWP 7, CI 1.6, PVR 10 WU.  - V/Q scan (2/20): Not suggestive of chronic PE.  - CTA chest (1/20): No PE, no evidence for ILD.  - PFTs (2/20): Moderate to severe obstructive lung disease. - Echo (7/20): EF 60-65%, with moderate RV dilation and mildly decreased systolic function, D-shaped septum, mild-moderate TR, PASP down to 45 mmHg (76 mmHg on last echo) - Unable to tolerate Opsumit.   Family History  Problem Relation Age of Onset  . Cancer Sister   . Cancer Brother   . Cancer Maternal Aunt   . Colon cancer Neg Hx    Social History   Socioeconomic History  . Marital status: Married    Spouse name: Not on file  . Number of children: Not on file  . Years of education: Not on file  . Highest education level: Not on file  Occupational History  . Not on file  Social Needs  . Financial resource strain: Not on file  . Food insecurity    Worry: Not on file    Inability: Not on file  . Transportation needs    Medical: Not on file    Non-medical: Not on file  Tobacco Use  . Smoking status: Current Every Day Smoker    Packs/day: 0.30    Years: 15.00  Pack years: 4.50    Types: Cigarettes  . Smokeless tobacco: Never Used  Substance and Sexual Activity  . Alcohol use: No  . Drug use: No  . Sexual activity: Not on file  Lifestyle  . Physical activity    Days per week: Not on file    Minutes per session: Not on file  . Stress: Not on file  Relationships  . Social Herbalist on phone: Not on file    Gets together: Not on file    Attends religious service: Not on file    Active member of club or organization: Not on file    Attends meetings of clubs or organizations: Not on file    Relationship status: Not on file  . Intimate partner violence    Fear of current or ex partner: Not on file     Emotionally abused: Not on file    Physically abused: Not on file    Forced sexual activity: Not on file  Other Topics Concern  . Not on file  Social History Narrative  . Not on file   ROS: All systems reviewed and negative except as per HPI.   Current Outpatient Medications  Medication Sig Dispense Refill  . albuterol (VENTOLIN HFA) 108 (90 Base) MCG/ACT inhaler Inhale 2 puffs into the lungs every 4 (four) hours as needed for wheezing or shortness of breath. 1 Inhaler 1  . amLODipine (NORVASC) 5 MG tablet Take 1 tablet (5 mg total) by mouth daily. 180 tablet 3  . clonazePAM (KLONOPIN) 1 MG tablet Take 1 mg by mouth 4 (four) times daily as needed (For nerves).     Marland Kitchen escitalopram (LEXAPRO) 10 MG tablet Take 1 tablet by mouth daily.    Marland Kitchen gabapentin (NEURONTIN) 300 MG capsule Take 300 mg by mouth 3 (three) times daily.    Marland Kitchen HYDROcodone-acetaminophen (NORCO) 10-325 MG tablet Take 1 tablet by mouth every 4 (four) hours as needed (pain).     Marland Kitchen ibuprofen (ADVIL,MOTRIN) 200 MG tablet Take 400 mg by mouth 2 (two) times daily as needed (pain).    . pantoprazole (PROTONIX) 40 MG tablet Take 40 mg by mouth at bedtime.     Marland Kitchen spironolactone (ALDACTONE) 25 MG tablet Take 1 tablet (25 mg total) by mouth daily.    . tadalafil, PAH, (ADCIRCA) 20 MG tablet Take 2 tablets (40 mg total) by mouth daily. 60 tablet 5   No current facility-administered medications for this encounter.    BP (!) 145/68   Pulse 96   Wt 65.1 kg (143 lb 9.6 oz)   SpO2 94%   BMI 23.90 kg/m  General: NAD Neck: No JVD, no thyromegaly or thyroid nodule.  Lungs: Clear to auscultation bilaterally with normal respiratory effort. CV: Nondisplaced PMI.  Heart regular S1/S2, no S3/S4, no murmur.  No peripheral edema.  No carotid bruit.  Normal pedal pulses.  Abdomen: Soft, nontender, no hepatosplenomegaly, no distention.  Skin: Intact without lesions or rashes.  Neurologic: Alert and oriented x 3.  Psych: Normal affect. Extremities:  No clubbing or cyanosis.  HEENT: Normal.   Assessment/Plan: 1. Pulmonary hypertension: Echo 1/20 with severe RV dilation/dysfunction.  RHC with moderate PH, high PVR, normal PCWP, low cardiac output.  No evidence for chronic PE by V/Q scan.  PFTs show a moderate to severe obstructive defect. Rheumatological serologies negative. She still smokes.  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  However, cannot rule out a group 1  component as elevated PVR seems out of proportion to lung disease.  Her breathing was better on tadalafil, however she stopped it because she was concerned that she was taking an erectile dysfunction medication.  - We discussed tadalafil, she will restart it at 40 mg daily.  - Unable to tolerate Opsumit.  - Can think about Tyvaso as a next step, will see how she is doing on tadalafil at next appt.  - I will not do 6 minute walk today as she is not taking tadalafil.  2. Chronic diastolic CHF/RV failure: She does not appear volume overloaded on exam.   - She can continue to use Lasix prn. BMET/BNP today.   3. COPD/smoking: She still smokes around 1/2 ppd.  She did not tolerate Chantix and Wellbutrin did not work.  She desaturates with exertion.  - Work on quitting smoking.  - I will try to get her portable oxygen.  She should use oxygen with exertion and while sleeping.  4. HTN: BP has been elevated.  - Increase amlodipine to 5 mg daily.    Followup in 3 months.   Loralie Champagne 02/23/2019

## 2019-02-23 NOTE — Patient Instructions (Addendum)
No lab work done today.  RESTART Tadalafil 75m(2 tabs) by mouth daily  INCREASE Amlodipine to 551m1 tab) by mouth daily.  START using oxygen nightly and with exertion.( [facility] will contact you)   Your physician recommends that you schedule a follow-up appointment in: 3 months  At the [facility] and your health needs are our priority. As part of our continuing mission to provide you with exceptional heart care, we have created designated Provider Care Teams. These Care Teams include your primary Cardiologist (physician) and Advanced Practice Providers (APPs- Physician Assistants and Nurse Practitioners) who all work together to provide you with the care you need, when you need it.   You may see any of the following providers on your designated Care Team at your next follow up: . Marland Kitchenr DaGlori Bickers Dr DaLoralie Champagne AmDarrick GrinderNP . BrLyda JesterPA   Please be sure to bring in all your medications bottles to every appointment.

## 2019-02-24 ENCOUNTER — Telehealth (HOSPITAL_COMMUNITY): Payer: Self-pay

## 2019-02-24 NOTE — Telephone Encounter (Signed)
Community message sent to [facility] for supplemental portable O2.

## 2019-03-30 ENCOUNTER — Other Ambulatory Visit (HOSPITAL_COMMUNITY): Payer: Self-pay | Admitting: *Deleted

## 2019-03-30 ENCOUNTER — Telehealth (HOSPITAL_COMMUNITY): Payer: Self-pay | Admitting: *Deleted

## 2019-03-30 NOTE — Telephone Encounter (Signed)
Called pts son Christia Reading no answer.  Called pt directly and gave med changes. Pt verbalized understanding.

## 2019-03-30 NOTE — Telephone Encounter (Signed)
Please call. Stop tadalafil.  Jaylaa Gallion NP-C  12:02 PM

## 2019-03-30 NOTE — Telephone Encounter (Signed)
pts son called and said patient has been on and off tadalafil for 6 a while because of side effects/reactions to the medication.  Pt has been on tadalafil for 6 weeks and son stated she has swelling in eyes and lips this happened before and Dr.McLean stopped medication and then ordered it again when reaction cleared up.  Pts son Christia Reading thinks maybe she needs to stop medication.  Routed to Darrick Grinder, NP for advice

## 2019-05-15 ENCOUNTER — Emergency Department (HOSPITAL_COMMUNITY): Payer: Medicare Other

## 2019-05-15 ENCOUNTER — Encounter (HOSPITAL_COMMUNITY): Payer: Self-pay | Admitting: Emergency Medicine

## 2019-05-15 ENCOUNTER — Emergency Department (HOSPITAL_COMMUNITY)
Admission: EM | Admit: 2019-05-15 | Discharge: 2019-05-15 | Discharge status: Against Medical Advice | Payer: Medicare Other | Attending: Emergency Medicine | Admitting: Emergency Medicine

## 2019-05-15 ENCOUNTER — Other Ambulatory Visit: Payer: Self-pay

## 2019-05-15 DIAGNOSIS — R0600 Dyspnea, unspecified: Secondary | ICD-10-CM | POA: Insufficient documentation

## 2019-05-15 DIAGNOSIS — Z79899 Other long term (current) drug therapy: Secondary | ICD-10-CM | POA: Insufficient documentation

## 2019-05-15 DIAGNOSIS — R0602 Shortness of breath: Secondary | ICD-10-CM | POA: Diagnosis present

## 2019-05-15 DIAGNOSIS — I10 Essential (primary) hypertension: Secondary | ICD-10-CM | POA: Insufficient documentation

## 2019-05-15 DIAGNOSIS — Z20822 Contact with and (suspected) exposure to covid-19: Secondary | ICD-10-CM | POA: Insufficient documentation

## 2019-05-15 DIAGNOSIS — J449 Chronic obstructive pulmonary disease, unspecified: Secondary | ICD-10-CM | POA: Insufficient documentation

## 2019-05-15 DIAGNOSIS — F17210 Nicotine dependence, cigarettes, uncomplicated: Secondary | ICD-10-CM | POA: Diagnosis not present

## 2019-05-15 LAB — COMPREHENSIVE METABOLIC PANEL
ALT: 12 U/L (ref 0–44)
AST: 14 U/L — ABNORMAL LOW (ref 15–41)
Albumin: 3.7 g/dL (ref 3.5–5.0)
Alkaline Phosphatase: 61 U/L (ref 38–126)
Anion gap: 11 (ref 5–15)
BUN: 11 mg/dL (ref 8–23)
CO2: 31 mmol/L (ref 22–32)
Calcium: 9 mg/dL (ref 8.9–10.3)
Chloride: 97 mmol/L — ABNORMAL LOW (ref 98–111)
Creatinine, Ser: 0.46 mg/dL (ref 0.44–1.00)
GFR calc Af Amer: >60 mL/min (ref >60)
GFR calc non Af Amer: >60 mL/min (ref >60)
Glucose, Bld: 89 mg/dL (ref 70–99)
Potassium: 3.8 mmol/L (ref 3.5–5.1)
Sodium: 139 mmol/L (ref 135–145)
Total Bilirubin: 1.2 mg/dL (ref 0.3–1.2)
Total Protein: 6.5 g/dL (ref 6.5–8.1)

## 2019-05-15 LAB — CBC WITH DIFFERENTIAL/PLATELET
Abs Immature Granulocytes: 0.02 10*3/uL (ref 0.00–0.07)
Basophils Absolute: 0 10*3/uL (ref 0.0–0.1)
Basophils Relative: 1 %
Eosinophils Absolute: 0.1 10*3/uL (ref 0.0–0.5)
Eosinophils Relative: 1 %
HCT: 52.7 % — ABNORMAL HIGH (ref 36.0–46.0)
Hemoglobin: 13.9 g/dL (ref 12.0–15.0)
Immature Granulocytes: 0 %
Lymphocytes Relative: 12 %
Lymphs Abs: 0.7 10*3/uL (ref 0.7–4.0)
MCH: 20.9 pg — ABNORMAL LOW (ref 26.0–34.0)
MCHC: 26.4 g/dL — ABNORMAL LOW (ref 30.0–36.0)
MCV: 79.2 fL — ABNORMAL LOW (ref 80.0–100.0)
Monocytes Absolute: 0.2 10*3/uL (ref 0.1–1.0)
Monocytes Relative: 4 %
Neutro Abs: 4.8 10*3/uL (ref 1.7–7.7)
Neutrophils Relative %: 82 %
Platelets: 123 10*3/uL — ABNORMAL LOW (ref 150–400)
RBC: 6.65 MIL/uL — ABNORMAL HIGH (ref 3.87–5.11)
RDW: 22.5 % — ABNORMAL HIGH (ref 11.5–15.5)
WBC: 5.8 10*3/uL (ref 4.0–10.5)
nRBC: 0 % (ref 0.0–0.2)

## 2019-05-15 LAB — TROPONIN I (HIGH SENSITIVITY)
Troponin I (High Sensitivity): 215 ng/L (ref <18)
Troponin I (High Sensitivity): 204 ng/L (ref <18)

## 2019-05-15 LAB — BRAIN NATRIURETIC PEPTIDE: B Natriuretic Peptide: 685 pg/mL — ABNORMAL HIGH (ref 0.0–100.0)

## 2019-05-15 LAB — RESPIRATORY PANEL BY RT PCR (FLU A&B, COVID)
Influenza A by PCR: NEGATIVE
Influenza B by PCR: NEGATIVE
SARS Coronavirus 2 by RT PCR: NEGATIVE

## 2019-05-15 MED ORDER — LEVOFLOXACIN 500 MG PO TABS: 500.0000 mg | ORAL_TABLET | Freq: Every day | ORAL | 0 refills | Status: DC

## 2019-05-15 MED ORDER — ALBUTEROL SULFATE HFA 108 (90 BASE) MCG/ACT IN AERS
6.0000 | INHALATION_SPRAY | Freq: Once | RESPIRATORY_TRACT | Status: AC
  Administered 2019-05-15: 6 via RESPIRATORY_TRACT
  Filled 2019-05-15: qty 6.7

## 2019-05-15 MED ORDER — PREDNISONE 50 MG PO TABS
60.0000 mg | ORAL_TABLET | Freq: Once | ORAL | Status: AC
  Administered 2019-05-15: 60 mg via ORAL
  Filled 2019-05-15: qty 1

## 2019-05-15 MED ORDER — LEVOFLOXACIN 500 MG PO TABS
500.0000 mg | ORAL_TABLET | Freq: Once | ORAL | Status: AC
  Administered 2019-05-15: 20:00:00 500 mg via ORAL
  Filled 2019-05-15: qty 1

## 2019-05-15 NOTE — ED Notes (Signed)
Date and time results received: 05/15/19 1705   Test: Troponin  Critical Value: 214  Name of Provider Notified: Dr. Roderic Palau  Orders Received? Or Actions Taken?: No new order given.

## 2019-05-15 NOTE — ED Provider Notes (Signed)
[facility] EMERGENCY DEPARTMENT Provider Note   CSN: [id number] Arrival date & time: 05/15/19  1505     History Chief Complaint  Patient presents with  . Shortness of Breath    [name] is a 74 y.o. female.  Patient complains of some shortness of breath.  Patient uses oxygen at night but not during the day.  She has a history of COPD  The history is provided by the patient. No language interpreter was used.  Shortness of Breath Severity:  Moderate Onset quality:  Sudden Timing:  Constant Progression:  Worsening Chronicity:  New Context: activity   Associated symptoms: no abdominal pain, no chest pain, no cough, no headaches and no rash        Past Medical History:  Diagnosis Date  . Arthritis   . Chronic back pain   . GERD (gastroesophageal reflux disease)   . Hypertension   . Thyroid disease   . Ulcer    [place] I    Patient Active Problem List   Diagnosis Date Noted  . COPD exacerbation ([place]) 07/26/2018  . Abdominal pain, other specified site 08/25/2012  . Loss of weight 08/25/2012    Past Surgical History:  Procedure Laterality Date  . ABDOMINAL HYSTERECTOMY    . BACK SURGERY     X4  . BILROTH I PROCEDURE    . CHOLECYSTECTOMY    . COLONOSCOPY   05/18/2003   [id number] colonoscopy/ Internal hemorrhoids.  Otherwise, normal rectum  . COLONOSCOPY  08/12/2010   Dr. Arnoldo Morale: cecum visualized and normal, colon and rectum normal. Torturous colon  . COLONOSCOPY N/A 09/07/2012   RMR: tubular adenoma, lipoma. Due for surveillance in 2021  . ESOPHAGOGASTRODUODENOSCOPY  05/18/2003   RMR:. Normal esophagus/Adenomatous-appearing mucosa at the anastomosis  . ESOPHAGOGASTRODUODENOSCOPY  08/12/2010   Dr. Arnoldo Morale: anastomosis widely patent, no ulcerations, CLO test negative  . RIGHT HEART CATH N/A 05/04/2018   Procedure: RIGHT HEART CATH;  Surgeon: Larey Dresser, MD;  Location: [place] [facility];  Service: Cardiovascular;  Laterality: N/A;  .  STOMACH SURGERY     removed partial stomach  . YAG LASER APPLICATION Left 35/45/6256   Procedure: YAG LASER APPLICATION;  Surgeon: [name], MD;  Location: [facility];  Service: Ophthalmology;  Laterality: Left;  . YAG LASER APPLICATION Right 38/93/7342   Procedure: YAG LASER APPLICATION;  Surgeon: [name], MD;  Location: [facility];  Service: Ophthalmology;  Laterality: Right;     OB History   No obstetric history on file.     Family History  Problem Relation Age of Onset  . Cancer Sister   . Cancer Brother   . Cancer Maternal Aunt   . Colon cancer Neg Hx     Social History   Tobacco Use  . Smoking status: Current Every Day Smoker    Packs/day: 0.30    Years: 15.00    Pack years: 4.50    Types: Cigarettes  . Smokeless tobacco: Never Used  Substance Use Topics  . Alcohol use: No  . Drug use: No    Home Medications Prior to Admission medications   Medication Sig Start Date End Date Taking? Authorizing Provider  albuterol (VENTOLIN HFA) 108 (90 Base) MCG/ACT inhaler Inhale 2 puffs into the lungs every 4 (four) hours as needed for wheezing or shortness of breath. 07/27/18   Johnson, Clanford L, MD  amLODipine (NORVASC) 5 MG tablet Take 1 tablet (5 mg total) by mouth daily. 02/23/19   Larey Dresser,  MD  clonazePAM (KLONOPIN) 1 MG tablet Take 1 mg by mouth 4 (four) times daily as needed (For nerves).     [provider]  COMBIVENT RESPIMAT 20-100 MCG/ACT AERS respimat Inhale 2 puffs into the lungs 2 (two) times daily. 04/18/19   [provider]  escitalopram (LEXAPRO) 10 MG tablet Take 1 tablet by mouth daily. 07/15/18   [provider]  gabapentin (NEURONTIN) 300 MG capsule Take 300 mg by mouth 3 (three) times daily.    [provider]  HYDROcodone-acetaminophen (NORCO) 10-325 MG tablet Take 1 tablet by mouth every 4 (four) hours as needed (pain).  01/22/17   [provider]  ibuprofen (ADVIL,MOTRIN) 200 MG tablet Take 400 mg by  mouth 2 (two) times daily as needed (pain).    [provider]  levofloxacin (LEVAQUIN) 500 MG tablet Take 1 tablet (500 mg total) by mouth daily. 05/15/19   Milton Ferguson, MD  pantoprazole (PROTONIX) 40 MG tablet Take 40 mg by mouth at bedtime.  08/24/12   [provider]  spironolactone (ALDACTONE) 25 MG tablet Take 1 tablet (25 mg total) by mouth daily. 07/31/18   Johnson, Clanford L, MD    Allergies    Chantix [varenicline], Codeine, and Oxycodone  Review of Systems   Review of Systems  Constitutional: Negative for appetite change and fatigue.  HENT: Negative for congestion, ear discharge and sinus pressure.   Eyes: Negative for discharge.  Respiratory: Positive for shortness of breath. Negative for cough.   Cardiovascular: Negative for chest pain.  Gastrointestinal: Negative for abdominal pain and diarrhea.  Genitourinary: Negative for frequency and hematuria.  Musculoskeletal: Negative for back pain.  Skin: Negative for rash.  Neurological: Negative for seizures and headaches.  Psychiatric/Behavioral: Negative for hallucinations.    Physical Exam Updated Vital Signs BP (!) 160/82   Pulse 81   Resp 16   Ht _0  (1.676 m)   Wt 63.5 kg   SpO2 92%   BMI 22.60 kg/m   Physical Exam Vitals and nursing note reviewed.  Constitutional:      Appearance: Normal appearance. She is well-developed.  HENT:     Head: Normocephalic.     Nose: Nose normal.  Eyes:     General: No scleral icterus.    Conjunctiva/sclera: Conjunctivae normal.  Neck:     Thyroid: No thyromegaly.  Cardiovascular:     Rate and Rhythm: Normal rate and regular rhythm.     Heart sounds: No murmur. No friction rub. No gallop.   Pulmonary:     Breath sounds: No stridor. No wheezing or rales.  Chest:     Chest wall: No tenderness.  Abdominal:     General: There is no distension.     Tenderness: There is no abdominal tenderness. There is no rebound.  Musculoskeletal:        General:  Normal range of motion.     Cervical back: Neck supple.  Lymphadenopathy:     Cervical: No cervical adenopathy.  Skin:    Findings: No erythema or rash.  Neurological:     Mental Status: She is alert and oriented to person, place, and time.     Motor: No abnormal muscle tone.     Coordination: Coordination normal.  Psychiatric:        Behavior: Behavior normal.     ED Results / Procedures / Treatments   Labs (all labs ordered are listed, but only abnormal results are displayed) Labs Reviewed  CBC WITH DIFFERENTIAL/PLATELET -  Abnormal; Notable for the following components:      Result Value   RBC 6.65 (*)    HCT 52.7 (*)    MCV 79.2 (*)    MCH 20.9 (*)    MCHC 26.4 (*)    RDW 22.5 (*)    Platelets 123 (*)    All other components within normal limits  COMPREHENSIVE METABOLIC PANEL - Abnormal; Notable for the following components:   Chloride 97 (*)    AST 14 (*)    All other components within normal limits  BRAIN NATRIURETIC PEPTIDE - Abnormal; Notable for the following components:   B Natriuretic Peptide 685.0 (*)    All other components within normal limits  TROPONIN I (HIGH SENSITIVITY) - Abnormal; Notable for the following components:   Troponin I (High Sensitivity) 215 (*)    All other components within normal limits  TROPONIN I (HIGH SENSITIVITY) - Abnormal; Notable for the following components:   Troponin I (High Sensitivity) 204 (*)    All other components within normal limits  RESPIRATORY PANEL BY RT PCR (FLU A&B, COVID)    EKG EKG Interpretation  Date/Time:  Monday May 15 2019 15:13:02 EST Ventricular Rate:  79 PR Interval:  142 QRS Duration: 68 QT Interval:  382 QTC Calculation: 438 R Axis:   95 Text Interpretation: Normal sinus rhythm Possible Left atrial enlargement Rightward axis Low voltage QRS Cannot rule out Anterior infarct , age undetermined Abnormal ECG No significant change was found Confirmed by Ezequiel Essex [phone]) on 05/15/2019  3:17:29 PM Also confirmed by Milton Ferguson [phone])  on 05/15/2019 5:14:11 PM   Radiology DG Chest Port 1 View  Result Date: 05/15/2019 CLINICAL DATA:  Shortness of breath. Weakness. EXAM: PORTABLE CHEST 1 VIEW COMPARISON:  07/26/2018 FINDINGS: There is chronic mild cardiomegaly. Aortic atherosclerosis. Pulmonary vascularity is normal. There is a patchy infiltrate at the right lung base. Slight blunting of the left costophrenic angle medially consistent with a small effusion. No discrete bone abnormality. IMPRESSION: Patchy infiltrate at the right lung base. Small left effusion. Aortic Atherosclerosis (ICD10-I70.0). Electronically Signed   By: Lorriane Shire M.D.   On: 05/15/2019 15:41    Procedures Procedures (including critical care time)  Medications Ordered in ED Medications  levofloxacin (LEVAQUIN) tablet 500 mg (has no administration in time range)  albuterol (VENTOLIN HFA) 108 (90 Base) MCG/ACT inhaler 6 puff (6 puffs Inhalation Given 05/15/19 1611)  predniSONE (DELTASONE) tablet 60 mg (60 mg Oral Given 05/15/19 1611)    ED Course  I have reviewed the triage vital signs and the nursing notes.  Pertinent labs & imaging results that were available during my care of the patient were reviewed by me and considered in my medical decision making (see chart for details).    MDM Rules/Calculators/A&P                     Patient has infiltrate on chest x-ray that could be pneumonia or worsening heart failure.  She has been told she needs to be admitted to the hospital for this but she is going to leave AMA.  I told her to continue using her oxygen during the day and I am going to put her on some Levaquin and she should see her doctor soon as possible.  Patient knows that she should be admitted to the hospital and she could have irreversible damage from not staying  Final Clinical Impression(s) / ED Diagnoses Final diagnoses:  Dyspnea, unspecified type  Rx / DC Orders ED Discharge Orders          Ordered    levofloxacin (LEVAQUIN) 500 MG tablet  Daily     05/15/19 2018           Milton Ferguson, MD 05/15/19 2025

## 2019-05-15 NOTE — ED Triage Notes (Signed)
SOB started today.  Denies any pain.

## 2019-05-15 NOTE — Discharge Instructions (Addendum)
Follow-up with your doctor soon as possible, use your oxygen during the day also

## 2019-05-29 ENCOUNTER — Telehealth (HOSPITAL_COMMUNITY): Payer: Self-pay | Admitting: Pharmacist

## 2019-05-29 ENCOUNTER — Encounter (HOSPITAL_COMMUNITY): Payer: Self-pay | Admitting: Cardiology

## 2019-05-29 ENCOUNTER — Ambulatory Visit (HOSPITAL_COMMUNITY)
Admission: RE | Admit: 2019-05-29 | Discharge: 2019-05-29 | Disposition: A | Discharge status: Home / Self Care | Payer: Medicare Other | Source: Ambulatory Visit | Attending: Cardiology | Admitting: Cardiology

## 2019-05-29 ENCOUNTER — Other Ambulatory Visit: Payer: Self-pay

## 2019-05-29 VITALS — BP 140/62 | HR 99 | Wt 137.2 lb

## 2019-05-29 DIAGNOSIS — I50810 Right heart failure, unspecified: Secondary | ICD-10-CM | POA: Diagnosis not present

## 2019-05-29 DIAGNOSIS — F17210 Nicotine dependence, cigarettes, uncomplicated: Secondary | ICD-10-CM | POA: Insufficient documentation

## 2019-05-29 DIAGNOSIS — I110 Hypertensive heart disease with heart failure: Secondary | ICD-10-CM | POA: Diagnosis not present

## 2019-05-29 DIAGNOSIS — I272 Pulmonary hypertension, unspecified: Secondary | ICD-10-CM

## 2019-05-29 DIAGNOSIS — I5032 Chronic diastolic (congestive) heart failure: Secondary | ICD-10-CM

## 2019-05-29 DIAGNOSIS — Z7901 Long term (current) use of anticoagulants: Secondary | ICD-10-CM | POA: Diagnosis not present

## 2019-05-29 DIAGNOSIS — J449 Chronic obstructive pulmonary disease, unspecified: Secondary | ICD-10-CM | POA: Diagnosis not present

## 2019-05-29 DIAGNOSIS — Z79899 Other long term (current) drug therapy: Secondary | ICD-10-CM | POA: Diagnosis not present

## 2019-05-29 LAB — BASIC METABOLIC PANEL
Anion gap: 14 (ref 5–15)
BUN: 14 mg/dL (ref 8–23)
CO2: 27 mmol/L (ref 22–32)
Calcium: 9.2 mg/dL (ref 8.9–10.3)
Chloride: 95 mmol/L — ABNORMAL LOW (ref 98–111)
Creatinine, Ser: 0.82 mg/dL (ref 0.44–1.00)
GFR calc Af Amer: >60 mL/min (ref >60)
GFR calc non Af Amer: >60 mL/min (ref >60)
Glucose, Bld: 139 mg/dL — ABNORMAL HIGH (ref 70–99)
Potassium: 4 mmol/L (ref 3.5–5.1)
Sodium: 136 mmol/L (ref 135–145)

## 2019-05-29 MED ORDER — FUROSEMIDE 40 MG PO TABS: 40.0000 mg | ORAL_TABLET | Freq: Every day | ORAL | 5 refills | Status: DC

## 2019-05-29 MED ORDER — TREPROSTINIL 0.6 MG/ML IN SOLN: 18.0000 ug | Freq: Four times a day (QID) | RESPIRATORY_TRACT | 11 refills | Status: DC

## 2019-05-29 NOTE — Patient Instructions (Addendum)
START Tyvaso as prescribed   INCREASE Lasix to 1m (1 tab) daily   Please call office to let [place]now how much potassium you are taking at home   Office will work on getting portable O2 concentrator    [place] today and [place] 10 [place] We will only contact you if something comes back abnormal or we need to make some changes. Otherwise no news is good news!   Your physician recommends that you schedule a follow-up appointment in: 6 weeks with Dr MAundra Dubin  Please call office at [phone] 2 if you have any questions or concerns.    [facility] you and your health needs are our priority. As part of our continuing mission to provide you with exceptional heart care, we have created designated Provider Care Teams. These Care Teams include your primary Cardiologist (physician) and Advanced Practice Providers (APPs- Physician Assistants and Nurse Practitioners) who all work together to provide you with the care you need, when you need it.   You may see any of the following providers on your designated Care Team at your next follow up: .Marland KitchenDr DGlori Bickers. Dr DLoralie Champagne. ADarrick Grinder NP . BLyda Jester PA . LAudry Riles PharmD   Please be sure to bring in all your medications bottles to every appointment.    .Marland Kitchen

## 2019-05-29 NOTE — Progress Notes (Signed)
6 minute walk: Pt ambulated 249 meters. Pt started out  85% RA, added 3L portable O2 improved to 95%. HR range 97-108.

## 2019-05-29 NOTE — Telephone Encounter (Signed)
Received message that patient will be starting on Tyvaso. Working on completing patient enrollment application. Once completed, will fax into [facility].   Audry Riles, PharmD, BCPS, BCCP, CPP Heart Failure Clinic Pharmacist [phone]

## 2019-05-30 NOTE — Progress Notes (Addendum)
PCP: Dr. Karie Kirks Cardiology: Dr. Harrington Challenger HF Cardiology: Dr. Aundra Dubin  74 y.o. smoker with COPD and HTN presents for followup of pulmonary hypertension.  Echo in 1/20 showed severe RV dilation and dysfunction.  RHC in 1/20 showed moderate pulmonary hypertension with elevated PVR and low cardiac output.  PCWP was normal.  PFTs showed moderate-severe obstruction.  V/Q scan showed no chronic PE. PFTs showed moderate to severe obstruction/COPD.    She was started on Opsumit given concern for pulmonary hypertension out of proportion to lung disease.  She had to stop this due to side effects.  She started on tadalafil and did well on it with improved symptoms.   Echo in 7/20 showed EF 60-65% with moderate RV dilation and mildly decreased systolic function, D-shaped septum, mild-moderate TR, PASP down to 45 mmHg (76 mmHg on last echo).  She developed angioedema, hard to tell if it was from spironolactone or tadalafil, so she is off both medications.    In 2/21, she was seen in the ER with dyspnea and found to have possible RLL PNA.  She was treated with levofloxacin.   She has had several friends die recently and has been under a lot of stress.  She is smoking more.  She is using oxygen at night and most of the time during the day.  No dyspnea walking on flat ground, but she gets short of breath walking up a hill or incline.  Poor stamina, tires after standing or walking for 5-10 minutes.  Main complaint is low back pain (chronic). Weight is down 6 lbs.   6 minute walk (2/20): 298 m 6 minute walk (7/20): 152 m 6 minute walk (9/20): 304 m 6 minute walk (2/21): 249 m  Labs (1/20): K 4, creatinine 0.8, TSH normal, BNP 994 Labs (2/20): HIV negative, SCL-70 negative, centromere Ab negative, RF negative Labs (5/20): BNP 140, ANA negative, K 3.9, creatinine 0.74 Labs (7/20): K 4.1, creatinine 0.77 Labs (9/20): K 4.1, creatinine 0.79 Labs (2/21): BNP 685, K 3.8, creatinine 0.46  PMH: 1. HTN 2. COPD: Active  smoker.  - PFTs (2/20): FVC 51%, FEV1 57%, ratio 72%, TLC 93%, DLCO 51% => moderate-severe obstructive disease.  3. H/o cholecystectomy 4. OA 5. H/o back surgery 6. Pulmonary hypertension:  - Echo (1/20): EF 60-65%, mild LVH, severe RV dilation with severely decreased RV systolic function, PASP 76 mmHg.  - RHC (1/20): mean RA 10, PA 55/21 mean 36, mean PCWP 7, CI 1.6, PVR 10 WU.  - V/Q scan (2/20): Not suggestive of chronic PE.  - CTA chest (1/20): No PE, no evidence for ILD.  - PFTs (2/20): Moderate to severe obstructive lung disease. - Echo (7/20): EF 60-65%, with moderate RV dilation and mildly decreased systolic function, D-shaped septum, mild-moderate TR, PASP down to 45 mmHg (76 mmHg on last echo) - Unable to tolerate Opsumit.  - Possible angioedema with tadalafil.   Family History  Problem Relation Age of Onset  . Cancer Sister   . Cancer Brother   . Cancer Maternal Aunt   . Colon cancer Neg Hx    Social History   Socioeconomic History  . Marital status: Married    Spouse name: Not on file  . Number of children: Not on file  . Years of education: Not on file  . Highest education level: Not on file  Occupational History  . Not on file  Tobacco Use  . Smoking status: Current Every Day Smoker    Packs/day: 0.30  Years: 15.00    Pack years: 4.50    Types: Cigarettes  . Smokeless tobacco: Never Used  Substance and Sexual Activity  . Alcohol use: No  . Drug use: No  . Sexual activity: Not on file  Other Topics Concern  . Not on file  Social History Narrative  . Not on file   Social Determinants of Health   Financial Resource Strain:   . Difficulty of Paying Living Expenses: Not on file  Food Insecurity:   . Worried About Charity fundraiser in the Last Year: Not on file  . Ran Out of Food in the Last Year: Not on file  Transportation Needs:   . Lack of Transportation (Medical): Not on file  . Lack of Transportation (Non-Medical): Not on file  Physical  Activity:   . Days of Exercise per Week: Not on file  . Minutes of Exercise per Session: Not on file  Stress:   . Feeling of Stress : Not on file  Social Connections:   . Frequency of Communication with Friends and Family: Not on file  . Frequency of Social Gatherings with Friends and Family: Not on file  . Attends Religious Services: Not on file  . Active Member of Clubs or Organizations: Not on file  . Attends Archivist Meetings: Not on file  . Marital Status: Not on file  Intimate Partner Violence:   . Fear of Current or Ex-Partner: Not on file  . Emotionally Abused: Not on file  . Physically Abused: Not on file  . Sexually Abused: Not on file   ROS: All systems reviewed and negative except as per HPI.   Current Outpatient Medications  Medication Sig Dispense Refill  . albuterol (VENTOLIN HFA) 108 (90 Base) MCG/ACT inhaler Inhale 2 puffs into the lungs every 4 (four) hours as needed for wheezing or shortness of breath. 1 Inhaler 1  . amLODipine (NORVASC) 5 MG tablet Take 1 tablet (5 mg total) by mouth daily. 180 tablet 3  . clonazePAM (KLONOPIN) 1 MG tablet Take 1 mg by mouth 4 (four) times daily as needed (For nerves).     . COMBIVENT RESPIMAT 20-100 MCG/ACT AERS respimat Inhale 2 puffs into the lungs 2 (two) times daily.    Marland Kitchen escitalopram (LEXAPRO) 10 MG tablet Take 1 tablet by mouth daily.    . furosemide (LASIX) 40 MG tablet Take 1 tablet (40 mg total) by mouth daily. 30 tablet 5  . gabapentin (NEURONTIN) 300 MG capsule Take 300 mg by mouth 3 (three) times daily.    Marland Kitchen HYDROcodone-acetaminophen (NORCO) 10-325 MG tablet Take 1 tablet by mouth every 4 (four) hours as needed (pain).     Marland Kitchen ibuprofen (ADVIL,MOTRIN) 200 MG tablet Take 400 mg by mouth 2 (two) times daily as needed (pain).    Marland Kitchen levofloxacin (LEVAQUIN) 500 MG tablet Take 1 tablet (500 mg total) by mouth daily. 7 tablet 0  . pantoprazole (PROTONIX) 40 MG tablet Take 40 mg by mouth at bedtime.     .  Treprostinil (TYVASO) 0.6 MG/ML SOLN Inhale 18 mcg into the lungs 4 (four) times daily. 2.9 mL 11   No current facility-administered medications for this encounter.   BP 140/62   Pulse 99   Wt 62.2 kg (137 lb 3.2 oz)   SpO2 95%   BMI 22.14 kg/m  General: NAD Neck: JVP 8-9 cm, no thyromegaly or thyroid nodule.  Lungs: Occasional rhonchi CV: Nondisplaced PMI.  Heart regular S1/S2, no S3/S4,  no murmur. Trace ankle edema.  No carotid bruit.  Normal pedal pulses.  Abdomen: Soft, nontender, no hepatosplenomegaly, no distention.  Skin: Intact without lesions or rashes.  Neurologic: Alert and oriented x 3.  Psych: Normal affect. Extremities: No clubbing or cyanosis.  HEENT: Normal.   Assessment/Plan: 1. Pulmonary hypertension: Echo 1/20 with severe RV dilation/dysfunction.  RHC with moderate PH, high PVR, normal PCWP, low cardiac output.  No evidence for chronic PE by V/Q scan.  PFTs show a moderate to severe obstructive defect. Rheumatological serologies negative. She still smokes.  I suspect a significant group 1 component of pulmonary HTN as elevated PVR seems out of proportion to lung disease. Probably also a group 3 component.  Unable to tolerate Opsumit. Her breathing was better on tadalafil, but she is now off this medication due to concern that it caused angioedema.   - I am going to start her on Tyvaso.  With her underlying COPD, this may help to limit worsening of V/Q mismatching (versus po medication).  - Baseline 6 minute walk done today.  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  - She has been taking Lasix 40 mg every other day, will increase to 40 mg daily.  BMET today and in 10 days.  - She is off spironolactone with possible angioedema.   3. COPD/smoking: She continues to smoke, more recently due to increased stress.  She did not tolerate Chantix and Wellbutrin did not work.  She desaturates with exertion.  - Work on quitting  smoking => try nicotine patch.  - I will order portable oxygen.  She should use oxygen with exertion and while sleeping.  4. HTN: BP borderline.  Increasing Lasix today so will keep amlodipine at 5 mg daily.    Followup in 6 wks.   Loralie Champagne 05/30/2019

## 2019-05-31 ENCOUNTER — Other Ambulatory Visit: Payer: Self-pay

## 2019-05-31 ENCOUNTER — Telehealth (HOSPITAL_COMMUNITY): Payer: Self-pay

## 2019-05-31 ENCOUNTER — Other Ambulatory Visit (HOSPITAL_COMMUNITY)
Admission: RE | Admit: 2019-05-31 | Discharge: 2019-05-31 | Disposition: A | Discharge status: Home / Self Care | Payer: Medicare Other | Source: Ambulatory Visit | Attending: Cardiology | Admitting: Cardiology

## 2019-05-31 DIAGNOSIS — I5032 Chronic diastolic (congestive) heart failure: Secondary | ICD-10-CM | POA: Diagnosis present

## 2019-05-31 LAB — BASIC METABOLIC PANEL
Anion gap: 13 (ref 5–15)
BUN: 14 mg/dL (ref 8–23)
CO2: 32 mmol/L (ref 22–32)
Calcium: 9.2 mg/dL (ref 8.9–10.3)
Chloride: 91 mmol/L — ABNORMAL LOW (ref 98–111)
Creatinine, Ser: 0.71 mg/dL (ref 0.44–1.00)
GFR calc Af Amer: >60 mL/min (ref >60)
GFR calc non Af Amer: >60 mL/min (ref >60)
Glucose, Bld: 199 mg/dL — ABNORMAL HIGH (ref 70–99)
Potassium: 4 mmol/L (ref 3.5–5.1)
Sodium: 136 mmol/L (ref 135–145)

## 2019-05-31 MED ORDER — POTASSIUM CHLORIDE ER 10 MEQ PO TBCR: 10.0000 meq | EXTENDED_RELEASE_TABLET | Freq: Two times a day (BID) | ORAL | 3 refills | Status: DC

## 2019-05-31 NOTE — Telephone Encounter (Signed)
Spoke with patients son, office received documentation of potassium dose she takes at home. He confirmed she is on kcl 10 meq bid.  Med list updated. He also reports that she had blood work done today. Pt experiencing dementia and mixed up instructions. Pt was supposed to go next week for labs.  New order placed and he will assist patient to go to lab next week.

## 2019-06-01 ENCOUNTER — Ambulatory Visit: Payer: Medicare Other | Attending: Internal Medicine

## 2019-06-01 DIAGNOSIS — Z23 Encounter for immunization: Secondary | ICD-10-CM | POA: Insufficient documentation

## 2019-06-01 NOTE — Progress Notes (Signed)
   Covid-19 Vaccination Clinic  Name:  [name]    MRN: [id number] DOB: [birth year]  06/01/2019  [name] was observed post Covid-19 immunization for 15 minutes without incidence. She was provided with Vaccine Information Sheet and instruction to access the V-Safe system.   [name] was instructed to call 911 with any severe reactions post vaccine: Marland Kitchen Difficulty breathing  . Swelling of your face and throat  . A fast heartbeat  . A bad rash all over your body  . Dizziness and weakness    Immunizations Administered    Name Date Dose VIS Date Route   Pfizer COVID-19 Vaccine 06/01/2019 10:25 AM 0.3 mL 03/17/2019 Intramuscular   Manufacturer: [place]   Lot: [id number]   [place]: [address]-8

## 2019-06-01 NOTE — Telephone Encounter (Signed)
Sent in patient enrollment application for Tyvaso to [place].  Enrollment status is pending, will continue to follow.   Audry Riles, PharmD, BCPS, BCCP, CPP Heart Failure Clinic Pharmacist [phone]

## 2019-06-05 ENCOUNTER — Other Ambulatory Visit: Payer: Self-pay | Admitting: Nurse Practitioner

## 2019-06-05 DIAGNOSIS — M5136 Other intervertebral disc degeneration, lumbar region: Secondary | ICD-10-CM

## 2019-06-07 ENCOUNTER — Other Ambulatory Visit: Payer: Self-pay | Admitting: Nurse Practitioner

## 2019-06-07 ENCOUNTER — Ambulatory Visit
Admission: RE | Admit: 2019-06-07 | Discharge: 2019-06-07 | Disposition: A | Discharge status: Home / Self Care | Payer: Medicare Other | Source: Ambulatory Visit | Attending: Nurse Practitioner | Admitting: Nurse Practitioner

## 2019-06-07 DIAGNOSIS — M5136 Other intervertebral disc degeneration, lumbar region: Secondary | ICD-10-CM

## 2019-06-07 MED ORDER — IOPAMIDOL (ISOVUE-M 200) INJECTION 41%
1.0000 mL | Freq: Once | INTRAMUSCULAR | Status: AC
  Administered 2019-06-07: 1 mL via EPIDURAL

## 2019-06-07 MED ORDER — METHYLPREDNISOLONE ACETATE 40 MG/ML INJ SUSP (RADIOLOG
120.0000 mg | Freq: Once | INTRAMUSCULAR | Status: AC
  Administered 2019-06-07: 120 mg via EPIDURAL

## 2019-06-07 NOTE — Discharge Instructions (Signed)

## 2019-06-11 NOTE — Addendum Note (Signed)
Encounter addended by: Larey Dresser, MD on: 06/11/2019 10:23 PM  Actions taken: Clinical Note Signed

## 2019-06-12 ENCOUNTER — Other Ambulatory Visit (HOSPITAL_COMMUNITY)
Admission: RE | Admit: 2019-06-12 | Discharge: 2019-06-12 | Disposition: A | Discharge status: Home / Self Care | Payer: Medicare Other | Source: Ambulatory Visit | Attending: Cardiology | Admitting: Cardiology

## 2019-06-12 DIAGNOSIS — I5032 Chronic diastolic (congestive) heart failure: Secondary | ICD-10-CM | POA: Diagnosis present

## 2019-06-12 LAB — BASIC METABOLIC PANEL
Anion gap: 10 (ref 5–15)
BUN: 15 mg/dL (ref 8–23)
CO2: 30 mmol/L (ref 22–32)
Calcium: 9.2 mg/dL (ref 8.9–10.3)
Chloride: 88 mmol/L — ABNORMAL LOW (ref 98–111)
Creatinine, Ser: 0.56 mg/dL (ref 0.44–1.00)
GFR calc Af Amer: >60 mL/min (ref >60)
GFR calc non Af Amer: >60 mL/min (ref >60)
Glucose, Bld: 102 mg/dL — ABNORMAL HIGH (ref 70–99)
Potassium: 4.4 mmol/L (ref 3.5–5.1)
Sodium: 128 mmol/L — ABNORMAL LOW (ref 135–145)

## 2019-06-12 NOTE — Telephone Encounter (Signed)
Faxed in Statement of Advanced Lung Disease and Progress note confirming diagnosis of Group 1 PAH to [facility].  Patient enrollment for Tyvaso in progress.   Audry Riles, PharmD, BCPS, BCCP, CPP Heart Failure Clinic Pharmacist [phone]

## 2019-06-13 ENCOUNTER — Telehealth (HOSPITAL_COMMUNITY): Payer: Self-pay

## 2019-06-13 NOTE — Telephone Encounter (Signed)
Spoke with patients son, advised of results and recommendations for patient to decrease fluid intake. Verbalized understanding.

## 2019-06-13 NOTE — Telephone Encounter (Signed)
-----  Message from Larey Dresser, MD sent at 06/12/2019 10:21 PM EST ----- Cut back on po fluid intake with low sodium.

## 2019-06-20 MED ORDER — TREPROSTINIL 0.6 MG/ML IN SOLN: 18.0000 ug | Freq: Four times a day (QID) | RESPIRATORY_TRACT | 11 refills | Status: DC

## 2019-06-20 NOTE — Telephone Encounter (Signed)
Patient has been approved to receive Tyvaso. Spoke with [facility] representative. Patient is scheduled to be counseled this week and her medication will be shipped after the initial session.   Audry Riles, PharmD, BCPS, BCCP, CPP Heart Failure Clinic Pharmacist [phone]

## 2019-06-27 ENCOUNTER — Ambulatory Visit: Payer: Medicare Other | Attending: Internal Medicine

## 2019-06-27 DIAGNOSIS — Z23 Encounter for immunization: Secondary | ICD-10-CM

## 2019-06-27 NOTE — Progress Notes (Signed)
   Covid-19 Vaccination Clinic  Name:  [name]    MRN: [id number] DOB: [birth year]  06/27/2019  [name] was observed post Covid-19 immunization for 15 minutes without incident. She was provided with Vaccine Information Sheet and instruction to access the V-Safe system.   [name] was instructed to call 911 with any severe reactions post vaccine: Marland Kitchen Difficulty breathing  . Swelling of face and throat  . A fast heartbeat  . A bad rash all over body  . Dizziness and weakness   Immunizations Administered    Name Date Dose VIS Date Route   Pfizer COVID-19 Vaccine 06/27/2019 10:04 AM 0.3 mL 03/17/2019 Intramuscular   Manufacturer: [place]   Lot: [id number]   [place]: [id number]

## 2019-06-28 ENCOUNTER — Other Ambulatory Visit (HOSPITAL_COMMUNITY): Payer: Self-pay | Admitting: *Deleted

## 2019-06-28 ENCOUNTER — Other Ambulatory Visit (HOSPITAL_COMMUNITY): Payer: Self-pay | Admitting: Cardiology

## 2019-06-28 MED ORDER — POTASSIUM CHLORIDE ER 10 MEQ PO TBCR: 10.0000 meq | EXTENDED_RELEASE_TABLET | Freq: Two times a day (BID) | ORAL | 3 refills | Status: DC

## 2019-07-03 ENCOUNTER — Telehealth (HOSPITAL_COMMUNITY): Payer: Self-pay | Admitting: *Deleted

## 2019-07-03 NOTE — Telephone Encounter (Signed)
Son left VM stating patient started Victoza less than 10 days ago and now she has swelling around her mouth and eyes. I called back to get more information. No answer/left vm requesting return call

## 2019-07-04 NOTE — Telephone Encounter (Signed)
Per Dr. Aundra Dubin If the symptoms started with Tyvaso and stopped when it was stopped, would try it one more time and if symptoms recur will have to stop the medication.

## 2019-07-04 NOTE — Telephone Encounter (Signed)
Called pts son Octavia Bruckner again and spoke with him. Pt did not start Victoza pts son was referring to her starting Tyvaso. Per [facility] nurses patient is to hold Tyvaso until Dr.McLean determines if patient should change dose or change therapy all together.  Swelling around eyes and mouth as well as bad headache started within less than 10 days of starting Tyvaso.   Routed to [place] for advice

## 2019-07-04 NOTE — Telephone Encounter (Signed)
If the symptoms started with Tyvaso and stopped when it was stopped, would try it one more time and if symptoms recur will have to stop the medication.

## 2019-07-11 ENCOUNTER — Other Ambulatory Visit: Payer: Self-pay

## 2019-07-11 ENCOUNTER — Ambulatory Visit (HOSPITAL_COMMUNITY)
Admission: RE | Admit: 2019-07-11 | Discharge: 2019-07-11 | Disposition: A | Discharge status: Home / Self Care | Payer: Medicare Other | Source: Ambulatory Visit | Attending: Cardiology | Admitting: Cardiology

## 2019-07-11 ENCOUNTER — Encounter (HOSPITAL_COMMUNITY): Payer: Self-pay | Admitting: Cardiology

## 2019-07-11 VITALS — BP 96/60 | HR 85 | Wt 138.2 lb

## 2019-07-11 DIAGNOSIS — Z9049 Acquired absence of other specified parts of digestive tract: Secondary | ICD-10-CM | POA: Insufficient documentation

## 2019-07-11 DIAGNOSIS — I110 Hypertensive heart disease with heart failure: Secondary | ICD-10-CM | POA: Diagnosis not present

## 2019-07-11 DIAGNOSIS — I50810 Right heart failure, unspecified: Secondary | ICD-10-CM | POA: Diagnosis not present

## 2019-07-11 DIAGNOSIS — Z79899 Other long term (current) drug therapy: Secondary | ICD-10-CM | POA: Diagnosis not present

## 2019-07-11 DIAGNOSIS — I5032 Chronic diastolic (congestive) heart failure: Secondary | ICD-10-CM | POA: Insufficient documentation

## 2019-07-11 DIAGNOSIS — F17210 Nicotine dependence, cigarettes, uncomplicated: Secondary | ICD-10-CM | POA: Diagnosis not present

## 2019-07-11 DIAGNOSIS — I272 Pulmonary hypertension, unspecified: Secondary | ICD-10-CM | POA: Insufficient documentation

## 2019-07-11 DIAGNOSIS — J449 Chronic obstructive pulmonary disease, unspecified: Secondary | ICD-10-CM | POA: Diagnosis not present

## 2019-07-11 LAB — BASIC METABOLIC PANEL
Anion gap: 9 (ref 5–15)
BUN: 7 mg/dL — ABNORMAL LOW (ref 8–23)
CO2: 31 mmol/L (ref 22–32)
Calcium: 9.4 mg/dL (ref 8.9–10.3)
Chloride: 90 mmol/L — ABNORMAL LOW (ref 98–111)
Creatinine, Ser: 0.52 mg/dL (ref 0.44–1.00)
GFR calc Af Amer: >60 mL/min (ref >60)
GFR calc non Af Amer: >60 mL/min (ref >60)
Glucose, Bld: 105 mg/dL — ABNORMAL HIGH (ref 70–99)
Potassium: 4.4 mmol/L (ref 3.5–5.1)
Sodium: 130 mmol/L — ABNORMAL LOW (ref 135–145)

## 2019-07-11 MED ORDER — COMBIVENT RESPIMAT 20-100 MCG/ACT IN AERS: 2.0000 | INHALATION_SPRAY | Freq: Two times a day (BID) | RESPIRATORY_TRACT | 2 refills | Status: DC

## 2019-07-11 NOTE — Patient Instructions (Addendum)
A refill have been sent for Combivent  Labs today We will only contact you if something comes back abnormal or we need to make some changes. Otherwise no news is good news!  Your physician recommends that you schedule a follow-up appointment in: 3 months with Dr Aundra Dubin  Next appointment: Friday, July 9th, 2021 at 11:00AM  July parking code: [id number]   Please call office at [phone] option 2 if you have any questions or concerns.    At the [facility], you and your health needs are our priority. As part of our continuing mission to provide you with exceptional heart care, we have created designated Provider Care Teams. These Care Teams include your primary Cardiologist (physician) and Advanced Practice Providers (APPs- Physician Assistants and Nurse Practitioners) who all work together to provide you with the care you need, when you need it.   You may see any of the following providers on your designated Care Team at your next follow up: Marland Kitchen Dr Glori Bickers . Dr Loralie Champagne . Darrick Grinder, NP . Lyda Jester, PA . Audry Riles, PharmD   Please be sure to bring in all your medications bottles to every appointment.

## 2019-07-12 NOTE — Progress Notes (Signed)
PCP: Dr. Karie Kirks Cardiology: Dr. Harrington Challenger HF Cardiology: Dr. Aundra Dubin  74 y.o. smoker with COPD and HTN presents for followup of pulmonary hypertension.  Echo in 1/20 showed severe RV dilation and dysfunction.  RHC in 1/20 showed moderate pulmonary hypertension with elevated PVR and low cardiac output.  PCWP was normal.  PFTs showed moderate-severe obstruction.  V/Q scan showed no chronic PE. PFTs showed moderate to severe obstruction/COPD.    She was started on Opsumit given concern for pulmonary hypertension out of proportion to lung disease.  She had to stop this due to side effects.  She started on tadalafil and did well on it with improved symptoms.   Echo in 7/20 showed EF 60-65% with moderate RV dilation and mildly decreased systolic function, D-shaped septum, mild-moderate TR, PASP down to 45 mmHg (76 mmHg on last echo).  She developed angioedema, hard to tell if it was from spironolactone or tadalafil, so she is off both medications.    In 2/21, she was seen in the ER with dyspnea and found to have possible RLL PNA.  She was treated with levofloxacin.   She has started on Tyvaso.  She feels like it has helped her breathing.  Gets short of breath with heavy exertion.  Still smoking about 5 cigarettes/day.  She has portable oxygen that she uses prn.  No chest pain.  No lightheadedness.    6 minute walk (2/20): 298 m 6 minute walk (7/20): 152 m 6 minute walk (9/20): 304 m 6 minute walk (2/21): 249 m  Labs (1/20): K 4, creatinine 0.8, TSH normal, BNP 994 Labs (2/20): HIV negative, SCL-70 negative, centromere Ab negative, RF negative Labs (5/20): BNP 140, ANA negative, K 3.9, creatinine 0.74 Labs (7/20): K 4.1, creatinine 0.77 Labs (9/20): K 4.1, creatinine 0.79 Labs (2/21): BNP 685, K 3.8, creatinine 0.46  PMH: 1. HTN 2. COPD: Active smoker.  - PFTs (2/20): FVC 51%, FEV1 57%, ratio 72%, TLC 93%, DLCO 51% => moderate-severe obstructive disease.  3. H/o cholecystectomy 4. OA 5. H/o  back surgery 6. Pulmonary hypertension:  - Echo (1/20): EF 60-65%, mild LVH, severe RV dilation with severely decreased RV systolic function, PASP 76 mmHg.  - RHC (1/20): mean RA 10, PA 55/21 mean 36, mean PCWP 7, CI 1.6, PVR 10 WU.  - V/Q scan (2/20): Not suggestive of chronic PE.  - CTA chest (1/20): No PE, no evidence for ILD.  - PFTs (2/20): Moderate to severe obstructive lung disease. - Echo (7/20): EF 60-65%, with moderate RV dilation and mildly decreased systolic function, D-shaped septum, mild-moderate TR, PASP down to 45 mmHg (76 mmHg on last echo) - Unable to tolerate Opsumit.  - Possible angioedema with tadalafil.   Family History  Problem Relation Age of Onset  . Cancer Sister   . Cancer Brother   . Cancer Maternal Aunt   . Colon cancer Neg Hx    Social History   Socioeconomic History  . Marital status: Married    Spouse name: Not on file  . Number of children: Not on file  . Years of education: Not on file  . Highest education level: Not on file  Occupational History  . Not on file  Tobacco Use  . Smoking status: Current Every Day Smoker    Packs/day: 0.30    Years: 15.00    Pack years: 4.50    Types: Cigarettes  . Smokeless tobacco: Never Used  Substance and Sexual Activity  . Alcohol use: No  .  Drug use: No  . Sexual activity: Not on file  Other Topics Concern  . Not on file  Social History Narrative  . Not on file   Social Determinants of Health   Financial Resource Strain:   . Difficulty of Paying Living Expenses:   Food Insecurity:   . Worried About Charity fundraiser in the Last Year:   . Arboriculturist in the Last Year:   Transportation Needs:   . Film/video editor (Medical):   Marland Kitchen Lack of Transportation (Non-Medical):   Physical Activity:   . Days of Exercise per Week:   . Minutes of Exercise per Session:   Stress:   . Feeling of Stress :   Social Connections:   . Frequency of Communication with Friends and Family:   . Frequency  of Social Gatherings with Friends and Family:   . Attends Religious Services:   . Active Member of Clubs or Organizations:   . Attends Archivist Meetings:   Marland Kitchen Marital Status:   Intimate Partner Violence:   . Fear of Current or Ex-Partner:   . Emotionally Abused:   Marland Kitchen Physically Abused:   . Sexually Abused:    ROS: All systems reviewed and negative except as per HPI.   Current Outpatient Medications  Medication Sig Dispense Refill  . amLODipine (NORVASC) 5 MG tablet Take 1 tablet (5 mg total) by mouth daily. 180 tablet 3  . clonazePAM (KLONOPIN) 1 MG tablet Take 1 mg by mouth 4 (four) times daily as needed (For nerves).     . COMBIVENT RESPIMAT 20-100 MCG/ACT AERS respimat Inhale 2 puffs into the lungs 2 (two) times daily. 4 g 2  . escitalopram (LEXAPRO) 10 MG tablet Take 1 tablet by mouth daily.    . furosemide (LASIX) 40 MG tablet Take 1 tablet (40 mg total) by mouth daily. 30 tablet 5  . gabapentin (NEURONTIN) 300 MG capsule Take 300 mg by mouth 3 (three) times daily.    Marland Kitchen HYDROcodone-acetaminophen (NORCO) 10-325 MG tablet Take 1 tablet by mouth every 4 (four) hours as needed (pain).     Marland Kitchen ibuprofen (ADVIL,MOTRIN) 200 MG tablet Take 400 mg by mouth 2 (two) times daily as needed (pain).    . pantoprazole (PROTONIX) 40 MG tablet Take 40 mg by mouth at bedtime.     . potassium chloride (KLOR-CON) 10 MEQ tablet TAKE 1 TABLET BY MOUTH TWICE DAILY. 60 tablet 5  . Treprostinil (TYVASO) 0.6 MG/ML SOLN Inhale 18 mcg into the lungs 4 (four) times daily. 87 mL 11   No current facility-administered medications for this encounter.   BP 96/60   Pulse 85   Wt 62.7 kg (138 lb 3.2 oz)   SpO2 94%   BMI 22.31 kg/m  General: NAD Neck: No JVD, no thyromegaly or thyroid nodule.  Lungs: Clear to auscultation bilaterally with normal respiratory effort. CV: Nondisplaced PMI.  Heart regular S1/S2, no S3/S4, no murmur.  No peripheral edema.  No carotid bruit.  Normal pedal pulses.  Abdomen:  Soft, nontender, no hepatosplenomegaly, no distention.  Skin: Intact without lesions or rashes.  Neurologic: Alert and oriented x 3.  Psych: Normal affect. Extremities: No clubbing or cyanosis.  HEENT: Normal.   Assessment/Plan: 1. Pulmonary hypertension: Echo 1/20 with severe RV dilation/dysfunction.  RHC with moderate PH, high PVR, normal PCWP, low cardiac output.  No evidence for chronic PE by V/Q scan.  PFTs show a moderate to severe obstructive defect. Rheumatological serologies negative.  She still smokes.  I suspect a significant group 1 component of pulmonary HTN as elevated PVR seems out of proportion to lung disease. Probably also a group 3 component.  Unable to tolerate Opsumit. Her breathing was better on tadalafil, but she is now off this medication due to concern that it caused angioedema.  She is now on Tyvaso and feels like her breathing is subjectively better.  - She will continue Tyvaso.  With her underlying COPD, this may help to limit worsening of V/Q mismatching (versus po medication).  - She has only been on Tyvaso for about a week, will have her do 6 minute walk at next appt.  2. RV failure: NYHA class II-III, she does not look volume overloaded on exam.   - Continue Lasix 40 mg daily, BMET today.   - She is off spironolactone with possible angioedema.   3. COPD/smoking: She continues to smoke, more recently due to increased stress.  She did not tolerate Chantix and Wellbutrin did not work.  She desaturates with exertion.  - Work on quitting smoking => try nicotine patch.  - She has portable oxygen to use with exertion. 4. HTN: BP is not elevated on current regimen.   Followup in 3 months.   Loralie Champagne 07/12/2019

## 2019-08-23 ENCOUNTER — Telehealth (HOSPITAL_COMMUNITY): Payer: Self-pay | Admitting: *Deleted

## 2019-08-23 NOTE — Telephone Encounter (Signed)
Order for portable O2 concentrator faxed to [facility]

## 2019-08-28 ENCOUNTER — Encounter: Payer: Self-pay | Admitting: Internal Medicine

## 2019-09-01 ENCOUNTER — Other Ambulatory Visit: Payer: Self-pay | Admitting: Nurse Practitioner

## 2019-09-01 DIAGNOSIS — G8929 Other chronic pain: Secondary | ICD-10-CM

## 2019-09-05 ENCOUNTER — Telehealth (HOSPITAL_COMMUNITY): Payer: Self-pay

## 2019-09-05 NOTE — Telephone Encounter (Signed)
Received an email from [employer] [facility] stating that they have not been able to reach patient to ship Tyvaso.  Called patients son, he reports that patient had a reaction to medication at the end of March.  They stopped the medication at that time but restarted it.  He endorses that she "felt bad" after she restarted it experiencing extreme fatigue and not able to carry out normal activity.  She has since stopped the medication for over a month. Please advise. She patient remain off med?

## 2019-09-05 NOTE — Telephone Encounter (Signed)
If she felt bad twice after taking it, she can stay off it.

## 2019-09-06 NOTE — Telephone Encounter (Signed)
Message sent to [facility] reps stating that patient will remain off Tyvaso.

## 2019-09-13 ENCOUNTER — Ambulatory Visit
Admission: RE | Admit: 2019-09-13 | Discharge: 2019-09-13 | Disposition: A | Discharge status: Home / Self Care | Payer: Medicare Other | Source: Ambulatory Visit | Attending: Nurse Practitioner | Admitting: Nurse Practitioner

## 2019-09-13 ENCOUNTER — Other Ambulatory Visit: Payer: Self-pay | Admitting: Nurse Practitioner

## 2019-09-13 DIAGNOSIS — G8929 Other chronic pain: Secondary | ICD-10-CM

## 2019-09-13 MED ORDER — METHYLPREDNISOLONE ACETATE 40 MG/ML INJ SUSP (RADIOLOG
120.0000 mg | Freq: Once | INTRAMUSCULAR | Status: AC
  Administered 2019-09-13: 120 mg via EPIDURAL

## 2019-09-13 MED ORDER — IOPAMIDOL (ISOVUE-M 200) INJECTION 41%
1.0000 mL | Freq: Once | INTRAMUSCULAR | Status: AC
  Administered 2019-09-13: 1 mL via EPIDURAL

## 2019-09-13 NOTE — Discharge Instructions (Signed)

## 2019-10-13 ENCOUNTER — Other Ambulatory Visit: Payer: Self-pay

## 2019-10-13 ENCOUNTER — Encounter (HOSPITAL_COMMUNITY): Payer: Self-pay | Admitting: Cardiology

## 2019-10-13 ENCOUNTER — Ambulatory Visit (HOSPITAL_COMMUNITY)
Admission: RE | Admit: 2019-10-13 | Discharge: 2019-10-13 | Disposition: A | Discharge status: Home / Self Care | Payer: Medicare Other | Source: Ambulatory Visit | Attending: Cardiology | Admitting: Cardiology

## 2019-10-13 VITALS — BP 118/58 | HR 94 | Wt 153.0 lb

## 2019-10-13 DIAGNOSIS — F17210 Nicotine dependence, cigarettes, uncomplicated: Secondary | ICD-10-CM | POA: Insufficient documentation

## 2019-10-13 DIAGNOSIS — J449 Chronic obstructive pulmonary disease, unspecified: Secondary | ICD-10-CM | POA: Insufficient documentation

## 2019-10-13 DIAGNOSIS — Z9049 Acquired absence of other specified parts of digestive tract: Secondary | ICD-10-CM | POA: Insufficient documentation

## 2019-10-13 DIAGNOSIS — Z79899 Other long term (current) drug therapy: Secondary | ICD-10-CM | POA: Insufficient documentation

## 2019-10-13 DIAGNOSIS — I272 Pulmonary hypertension, unspecified: Secondary | ICD-10-CM

## 2019-10-13 DIAGNOSIS — Z791 Long term (current) use of non-steroidal anti-inflammatories (NSAID): Secondary | ICD-10-CM | POA: Insufficient documentation

## 2019-10-13 DIAGNOSIS — I10 Essential (primary) hypertension: Secondary | ICD-10-CM | POA: Diagnosis not present

## 2019-10-13 LAB — BASIC METABOLIC PANEL
Anion gap: 11 (ref 5–15)
BUN: 19 mg/dL (ref 8–23)
CO2: 27 mmol/L (ref 22–32)
Calcium: 8.8 mg/dL — ABNORMAL LOW (ref 8.9–10.3)
Chloride: 101 mmol/L (ref 98–111)
Creatinine, Ser: 0.9 mg/dL (ref 0.44–1.00)
GFR calc Af Amer: >60 mL/min (ref >60)
GFR calc non Af Amer: >60 mL/min (ref >60)
Glucose, Bld: 104 mg/dL — ABNORMAL HIGH (ref 70–99)
Potassium: 4.3 mmol/L (ref 3.5–5.1)
Sodium: 139 mmol/L (ref 135–145)

## 2019-10-13 NOTE — Patient Instructions (Signed)
Labs today We will only contact you if something comes back abnormal or we need to make some changes. Otherwise no news is good news!  Your physician has requested that you have an echocardiogram. Echocardiography is a painless test that uses sound waves to create images of your heart. It provides your doctor with information about the size and shape of your heart and how well your heart's chambers and valves are working. This procedure takes approximately one hour. There are no restrictions for this procedure.  Your physician recommends that you schedule a follow-up appointment in: 3 months with Dr Aundra Dubin and echo  At the [facility], you and your health needs are our priority. As part of our continuing mission to provide you with exceptional heart care, we have created designated Provider Care Teams. These Care Teams include your primary Cardiologist (physician) and Advanced Practice Providers (APPs- Physician Assistants and Nurse Practitioners) who all work together to provide you with the care you need, when you need it.   You may see any of the following providers on your designated Care Team at your next follow up: Marland Kitchen Dr Glori Bickers . Dr Loralie Champagne . Darrick Grinder, NP . Lyda Jester, PA . Audry Riles, PharmD   Please be sure to bring in all your medications bottles to every appointment.

## 2019-10-15 NOTE — Progress Notes (Signed)
PCP: Dr. Karie Kirks Cardiology: Dr. Harrington Challenger HF Cardiology: Dr. Aundra Dubin  74 y.o. smoker with COPD and HTN presents for followup of pulmonary hypertension.  Echo in 1/20 showed severe RV dilation and dysfunction.  RHC in 1/20 showed moderate pulmonary hypertension with elevated PVR and low cardiac output.  PCWP was normal.  PFTs showed moderate-severe obstruction.  V/Q scan showed no chronic PE. PFTs showed moderate to severe obstruction/COPD.    She was started on Opsumit given concern for pulmonary hypertension out of proportion to lung disease.  She had to stop this due to side effects.  She started on tadalafil and did well on it with improved symptoms.   Echo in 7/20 showed EF 60-65% with moderate RV dilation and mildly decreased systolic function, D-shaped septum, mild-moderate TR, PASP down to 45 mmHg (76 mmHg on last echo).  She developed angioedema, hard to tell if it was from spironolactone or tadalafil, so she is off both medications.    In 2/21, she was seen in the ER with dyspnea and found to have possible RLL PNA.  She was treated with levofloxacin.   She has started on Tyvaso.  She is still smoking.  Weight is up about 15 lbs.  She says that she has been hungry and is eating a lot of peanut butter and jelly. Mild dyspnea walking up hills, does ok on flat ground.  Using oxygen only prn.  No chest pain, lightheadedness, or syncope.    6 minute walk (2/20): 298 m 6 minute walk (7/20): 152 m 6 minute walk (9/20): 304 m 6 minute walk (2/21): 249 m  Labs (1/20): K 4, creatinine 0.8, TSH normal, BNP 994 Labs (2/20): HIV negative, SCL-70 negative, centromere Ab negative, RF negative Labs (5/20): BNP 140, ANA negative, K 3.9, creatinine 0.74 Labs (7/20): K 4.1, creatinine 0.77 Labs (9/20): K 4.1, creatinine 0.79 Labs (2/21): BNP 685, K 3.8, creatinine 0.46 Labs (4/21): K 4.4, creatinine 0.52  PMH: 1. HTN 2. COPD: Active smoker.  - PFTs (2/20): FVC 51%, FEV1 57%, ratio 72%, TLC 93%, DLCO  51% => moderate-severe obstructive disease.  3. H/o cholecystectomy 4. OA 5. H/o back surgery 6. Pulmonary hypertension:  - Echo (1/20): EF 60-65%, mild LVH, severe RV dilation with severely decreased RV systolic function, PASP 76 mmHg.  - RHC (1/20): mean RA 10, PA 55/21 mean 36, mean PCWP 7, CI 1.6, PVR 10 WU.  - V/Q scan (2/20): Not suggestive of chronic PE.  - CTA chest (1/20): No PE, no evidence for ILD.  - PFTs (2/20): Moderate to severe obstructive lung disease. - Echo (7/20): EF 60-65%, with moderate RV dilation and mildly decreased systolic function, D-shaped septum, mild-moderate TR, PASP down to 45 mmHg (76 mmHg on last echo) - Unable to tolerate Opsumit.  - Possible angioedema with tadalafil.   Family History  Problem Relation Age of Onset  . Cancer Sister   . Cancer Brother   . Cancer Maternal Aunt   . Colon cancer Neg Hx    Social History   Socioeconomic History  . Marital status: Married    Spouse name: Not on file  . Number of children: Not on file  . Years of education: Not on file  . Highest education level: Not on file  Occupational History  . Not on file  Tobacco Use  . Smoking status: Current Every Day Smoker    Packs/day: 0.30    Years: 15.00    Pack years: 4.50    Types:  Cigarettes  . Smokeless tobacco: Never Used  Vaping Use  . Vaping Use: Never used  Substance and Sexual Activity  . Alcohol use: No  . Drug use: No  . Sexual activity: Not on file  Other Topics Concern  . Not on file  Social History Narrative  . Not on file   Social Determinants of Health   Financial Resource Strain:   . Difficulty of Paying Living Expenses:   Food Insecurity:   . Worried About Charity fundraiser in the Last Year:   . Arboriculturist in the Last Year:   Transportation Needs:   . Film/video editor (Medical):   Marland Kitchen Lack of Transportation (Non-Medical):   Physical Activity:   . Days of Exercise per Week:   . Minutes of Exercise per Session:    Stress:   . Feeling of Stress :   Social Connections:   . Frequency of Communication with Friends and Family:   . Frequency of Social Gatherings with Friends and Family:   . Attends Religious Services:   . Active Member of Clubs or Organizations:   . Attends Archivist Meetings:   Marland Kitchen Marital Status:   Intimate Partner Violence:   . Fear of Current or Ex-Partner:   . Emotionally Abused:   Marland Kitchen Physically Abused:   . Sexually Abused:    ROS: All systems reviewed and negative except as per HPI.   Current Outpatient Medications  Medication Sig Dispense Refill  . amLODipine (NORVASC) 5 MG tablet Take 1 tablet (5 mg total) by mouth daily. 180 tablet 3  . clonazePAM (KLONOPIN) 1 MG tablet Take 1 mg by mouth 4 (four) times daily as needed (For nerves).     . COMBIVENT RESPIMAT 20-100 MCG/ACT AERS respimat Inhale 2 puffs into the lungs 2 (two) times daily. 4 g 2  . escitalopram (LEXAPRO) 10 MG tablet Take 1 tablet by mouth daily.    . furosemide (LASIX) 40 MG tablet Take 1 tablet (40 mg total) by mouth daily. 30 tablet 5  . gabapentin (NEURONTIN) 300 MG capsule Take 300 mg by mouth 3 (three) times daily.    Marland Kitchen HYDROcodone-acetaminophen (NORCO) 10-325 MG tablet Take 1 tablet by mouth every 4 (four) hours as needed (pain).     Marland Kitchen ibuprofen (ADVIL,MOTRIN) 200 MG tablet Take 400 mg by mouth 2 (two) times daily as needed (pain).    . pantoprazole (PROTONIX) 40 MG tablet Take 40 mg by mouth at bedtime.     . potassium chloride (KLOR-CON) 10 MEQ tablet TAKE 1 TABLET BY MOUTH TWICE DAILY. 60 tablet 5  . Treprostinil (TYVASO) 0.6 MG/ML SOLN Inhale 18 mcg into the lungs 4 (four) times daily. 87 mL 11   No current facility-administered medications for this encounter.   BP (!) 118/58   Pulse 94   Wt 69.4 kg (153 lb)   SpO2 95%   BMI 24.69 kg/m  General: NAD Neck: No JVD, no thyromegaly or thyroid nodule.  Lungs: Clear to auscultation bilaterally with normal respiratory effort. CV:  Nondisplaced PMI.  Heart regular S1/S2, no S3/S4, no murmur.  No peripheral edema.  No carotid bruit.  Normal pedal pulses.  Abdomen: Soft, nontender, no hepatosplenomegaly, no distention.  Skin: Intact without lesions or rashes.  Neurologic: Alert and oriented x 3.  Psych: Normal affect. Extremities: No clubbing or cyanosis.  HEENT: Normal.   Assessment/Plan: 1. Pulmonary hypertension: Echo 1/20 with severe RV dilation/dysfunction.  RHC with moderate PH, high  PVR, normal PCWP, low cardiac output.  No evidence for chronic PE by V/Q scan.  PFTs show a moderate to severe obstructive defect. Rheumatological serologies negative. She still smokes.  I suspect a significant group 1 component of pulmonary HTN as elevated PVR seems out of proportion to lung disease. Probably also a group 3 component.  Unable to tolerate Opsumit. Her breathing was better on tadalafil, but she is now off this medication due to concern that it caused angioedema.  She is now on Tyvaso and feels like her breathing is subjectively better.  - She will continue Tyvaso.  With her underlying COPD, this may help to limit worsening of V/Q mismatching (versus po medication).  - 6 minute walk today.  - Echo at followup appt to follow RV.  2. RV failure: NYHA class II, she does not look volume overloaded on exam.   - Continue Lasix 40 mg daily, BMET today.   - She is off spironolactone with possible angioedema.   3. COPD/smoking: She continues to smoke.  She did not tolerate Chantix and Wellbutrin did not work.  She uses oxygen prn.  - Work on quitting smoking => try nicotine patch.  - She has portable oxygen to use with exertion. 4. HTN: BP is not elevated on current regimen.   Followup in 3 months with echo.   Loralie Champagne 10/15/2019

## 2019-10-18 ENCOUNTER — Other Ambulatory Visit (HOSPITAL_COMMUNITY): Payer: Self-pay | Admitting: Cardiology

## 2019-10-18 MED ORDER — FUROSEMIDE 40 MG PO TABS: 40.0000 mg | ORAL_TABLET | Freq: Every day | ORAL | 5 refills | Status: DC

## 2019-10-20 ENCOUNTER — Encounter: Payer: Self-pay | Admitting: Gastroenterology

## 2019-10-20 ENCOUNTER — Other Ambulatory Visit: Payer: Self-pay

## 2019-10-20 ENCOUNTER — Ambulatory Visit (INDEPENDENT_AMBULATORY_CARE_PROVIDER_SITE_OTHER): Payer: Medicare Other | Admitting: Gastroenterology

## 2019-10-20 DIAGNOSIS — R1310 Dysphagia, unspecified: Secondary | ICD-10-CM

## 2019-10-20 DIAGNOSIS — Z860101 Personal history of adenomatous and serrated colon polyps: Secondary | ICD-10-CM | POA: Insufficient documentation

## 2019-10-20 DIAGNOSIS — Z86010 Personal history of colonic polyps: Secondary | ICD-10-CM | POA: Diagnosis not present

## 2019-10-20 DIAGNOSIS — R1319 Other dysphagia: Secondary | ICD-10-CM

## 2019-10-20 NOTE — Patient Instructions (Signed)
Colonoscopy and upper endoscopy to be scheduled.   Call with any questions or concerns.

## 2019-10-20 NOTE — Progress Notes (Signed)
Primary Care Physician:  Lemmie Evens, MD  Primary Gastroenterologist:  Garfield Cornea, MD   Chief Complaint  Patient presents with  . Consult    TCS recall last done 2014    HPI:  [name] is a 74 y.o. 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  Last colonoscopy in 2014, she had a tubular adenoma removed.  Also with complaints of dysphagia.   Complains of dysphagia to solids, liquids, pills occurring for months. Everything sticks in the chest. No vomiting. No abdominal pain. Heartburn controlled on pantoprazole. BM regular. No melena, brbpr. Stopped her oxygen few weeks ago. Used to wear oxygen at night. States her pulse ox has been good, she checks it frequently at home. Continues with her nebulizers. States her breathing has been improved since her diagnosis of pulmonary HTN and medications were changed.   Takes advil once per day. No ASA powders. 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    she is requesting EGD at time of her colonoscopy. Worried about her dysphagia. Brother died from esophageal cancer in his 40s.   Current Outpatient Medications  Medication Sig Dispense Refill  . amLODipine (NORVASC) 5 MG tablet Take 1 tablet (5 mg total) by mouth daily. 180 tablet 3  . clonazePAM (KLONOPIN) 1 MG tablet Take 1 mg by mouth 4 (four) times daily as needed (For nerves).     . COMBIVENT RESPIMAT 20-100 MCG/ACT AERS respimat Inhale 2 puffs into the lungs 2 (two) times daily. 4 g 2  . escitalopram (LEXAPRO) 10 MG tablet Take 1 tablet by mouth daily.    . furosemide (LASIX) 40 MG tablet Take 1 tablet (40 mg total) by mouth daily. 30 tablet 5  . gabapentin (NEURONTIN) 300 MG capsule Take 300 mg by mouth 3 (three) times daily.    Marland Kitchen HYDROcodone-acetaminophen (NORCO) 10-325 MG tablet Take 1 tablet by mouth every 4 (four) hours as needed (pain).     Marland Kitchen ibuprofen (ADVIL,MOTRIN) 200 MG tablet Take 400 mg by  mouth 2 (two) times daily as needed (pain).    . pantoprazole (PROTONIX) 40 MG tablet Take 40 mg by mouth at bedtime.     . potassium chloride (KLOR-CON) 10 MEQ tablet TAKE 1 TABLET BY MOUTH TWICE DAILY. 60 tablet 5  . Treprostinil (TYVASO) 0.6 MG/ML SOLN Inhale 18 mcg into the lungs 4 (four) times daily. 87 mL 11   No current facility-administered medications for this visit.    Allergies as of 10/20/2019 - Review Complete 10/20/2019  Allergen Reaction Noted  . Chantix [varenicline] Nausea And Vomiting 12/01/2016  . Codeine Other (See Comments) 12/01/2016  . Oxycodone Palpitations 01/11/2017    Past Medical History:  Diagnosis Date  . Arthritis   . Chronic back pain   . COPD (chronic obstructive pulmonary disease) ([place])   . Depression   . GERD (gastroesophageal reflux disease)   . 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 Hypertension   . Pulmonary HTN ([place])   . Thyroid disease   . Ulcer    Billroth I    Past Surgical History:  Procedure Laterality Date  . ABDOMINAL HYSTERECTOMY    . BACK SURGERY     X4  . BILROTH I PROCEDURE    . CHOLECYSTECTOMY    . COLONOSCOPY   05/18/2003   RTM:YTRZNBVAPO colonoscopy/ Internal hemorrhoids.  Otherwise, normal rectum  . COLONOSCOPY  08/12/2010   Dr. Arnoldo Morale: cecum visualized and  normal, colon and rectum normal. Torturous colon  . COLONOSCOPY N/A 09/07/2012   RMR: tubular adenoma, lipoma. Due for surveillance in 2021  . ESOPHAGOGASTRODUODENOSCOPY  05/18/2003   RMR:. Normal esophagus/Adenomatous-appearing mucosa at the anastomosis  . ESOPHAGOGASTRODUODENOSCOPY  08/12/2010   Dr. Arnoldo Morale: anastomosis widely patent, no ulcerations, CLO test negative  . RIGHT HEART CATH N/A 05/04/2018   Procedure: RIGHT HEART CATH;  Surgeon: Larey Dresser, MD;  Location: [facility];  Service: Cardiovascular;  Laterality: N/A;  . STOMACH SURGERY     removed partial stomach  . YAG LASER APPLICATION Left 19/41/7408   Procedure: YAG LASER APPLICATION;   Surgeon: [name], MD;  Location: [facility];  Service: Ophthalmology;  Laterality: Left;  . YAG LASER APPLICATION Right 14/48/1856   Procedure: YAG LASER APPLICATION;  Surgeon: [name], MD;  Location: [facility];  Service: Ophthalmology;  Laterality: Right;    Family History  Problem Relation Age of Onset  . Cancer Sister   . Cancer Brother   . Cancer Maternal Aunt   . Colon cancer Neg Hx     Social History   Socioeconomic History  . Marital status: Married    Spouse name: Not on file  . Number of children: Not on file  . Years of education: Not on file  . Highest education level: Not on file  Occupational History  . Not on file  Tobacco Use  . Smoking status: Current Every Day Smoker    Packs/day: 0.30    Years: 15.00    Pack years: 4.50    Types: Cigarettes  . Smokeless tobacco: Never Used  Vaping Use  . Vaping Use: Never used  Substance and Sexual Activity  . Alcohol use: No  . Drug use: No  . Sexual activity: Not on file  Other Topics Concern  . Not on file  Social History Narrative  . Not on file   Social Determinants of Health   Financial Resource Strain:   . Difficulty of Paying Living Expenses:   Food Insecurity:   . Worried About Charity fundraiser in the Last Year:   . Arboriculturist in the Last Year:   Transportation Needs:   . Film/video editor (Medical):   Marland Kitchen Lack of Transportation (Non-Medical):   Physical Activity:   . Days of Exercise per Week:   . Minutes of Exercise per Session:   Stress:   . Feeling of Stress :   Social Connections:   . Frequency of Communication with Friends and Family:   . Frequency of Social Gatherings with Friends and Family:   . Attends Religious Services:   . Active Member of Clubs or Organizations:   . Attends Archivist Meetings:   Marland Kitchen Marital Status:   Intimate Partner Violence:   . Fear of Current or Ex-Partner:   . Emotionally Abused:   Marland Kitchen Physically Abused:   . Sexually Abused:        ROS:  General: Negative for anorexia, weight loss, fever, chills, fatigue, weakness. Eyes: Negative for vision changes.  ENT: Negative for hoarseness,  nasal congestion. See hpi CV: Negative for chest pain, angina, palpitations, dyspnea on exertion, peripheral edema.  Respiratory: Negative for dyspnea at rest, dyspnea on exertion, cough, sputum, wheezing.  GI: See history of present illness. GU:  Negative for dysuria, hematuria, urinary incontinence, urinary frequency, nocturnal urination.  MS: Negative for joint pain. Chronic low back pain.  Derm: Negative for rash or itching.  Neuro: Negative for weakness, abnormal sensation, seizure, frequent headaches, memory loss, confusion.  Psych: Negative for suicidal ideation, hallucinations. +anxiety Endo: Negative for unusual weight change.  Heme: Negative for bruising or bleeding. Allergy: Negative for rash or hives.    Physical Examination:  BP 128/69   Pulse 91   Temp (!) 97.4 F (36.3 C) (Oral)   Ht _0  (1.651 m)   Wt 151 lb (68.5 kg)   BMI 25.13 kg/m    General: Well-nourished, well-developed in no acute distress.  Head: Normocephalic, atraumatic.   Eyes: Conjunctiva pink, no icterus. Mouth:masked Neck: Supple without thyromegaly, masses, or lymphadenopathy.  Lungs: Clear to auscultation bilaterally.  Heart: Regular rate and rhythm, no murmurs rubs or gallops.  Abdomen: Bowel sounds are normal, nontender, nondistended, no hepatosplenomegaly or masses, no abdominal bruits or    hernia , no rebound or guarding.   Rectal: not performed Extremities: No lower extremity edema. No clubbing or deformities.  Neuro: Alert and oriented x 4 , grossly normal neurologically.  Skin: Warm and dry, no rash or jaundice.   Psych: Alert and cooperative, normal mood and affect.  Labs: Lab Results  Component Value Date   CREATININE 0.90 10/13/2019   BUN 19 10/13/2019   NA 139 10/13/2019   K 4.3 10/13/2019   CL 101 10/13/2019   CO2 27  10/13/2019   Lab Results  Component Value Date   ALT 12 05/15/2019   AST 14 (L) 05/15/2019   ALKPHOS 61 05/15/2019   BILITOT 1.2 05/15/2019   Lab Results  Component Value Date   WBC 5.8 05/15/2019   HGB 13.9 05/15/2019   HCT 52.7 (H) 05/15/2019   MCV 79.2 (L) 05/15/2019   PLT 123 (L) 05/15/2019    Lab Results  Component Value Date   TSH 0.556 04/07/2018     Imaging Studies: No results found.  Impression/Plan:  Pleasant 74 year old female with history of COPD, pulmonary hypertension with right ventricular failure GERD, adenomatous colon polyps presenting for follow-up.  Complains of esophageal dysphagia to solids, liquids, pills.  Family history significant for esophageal cancer, brother in his 50s.  Esophageal dysphagia: Typical reflux well controlled on pantoprazole.  She has a history of Billroth I configuration, last EGD in 2012 with patent anastomosis.  Recommend EGD with esophageal dilation in the near future.  Due to polypharmacy and requirement for high-dose conscious sedation medications previously, plan for deep sedation with assistance by anesthesiology. ASA III.  I have discussed the risks, alternatives, benefits with regards to but not limited to the risk of reaction to medication, bleeding, infection, perforation and the patient is agreeable to proceed. Written consent to be obtained.  History of adenomatous colon polyps: Due for surveillance colonoscopy at this time.  Plan for deep sedation.  ASA III.  I have discussed the risks, alternatives, benefits with regards to but not limited to the risk of reaction to medication, bleeding, infection, perforation and the patient is agreeable to proceed. Written consent to be obtained.

## 2019-11-10 ENCOUNTER — Telehealth: Payer: Self-pay | Admitting: *Deleted

## 2019-11-10 ENCOUNTER — Encounter: Payer: Self-pay | Admitting: *Deleted

## 2019-11-10 NOTE — Telephone Encounter (Signed)
PA approved via [facility] website. Auth# [id number] Dates 12/18/2019-03/17/2020

## 2019-11-10 NOTE — Telephone Encounter (Signed)
Patient needs TCS/EGD/ED with propofol with Dr. Gala Romney. Called pt and she has been scheduled for 9/13 at 12:30pm. Aware she will need pre-op/covid test appt prior. Advised will mail this with her prep instructions. Confirmed mailing address correct.

## 2019-11-29 ENCOUNTER — Other Ambulatory Visit (HOSPITAL_COMMUNITY): Payer: Self-pay | Admitting: Family Medicine

## 2019-11-29 DIAGNOSIS — Z1231 Encounter for screening mammogram for malignant neoplasm of breast: Secondary | ICD-10-CM

## 2019-12-06 ENCOUNTER — Other Ambulatory Visit: Payer: Self-pay

## 2019-12-06 ENCOUNTER — Ambulatory Visit (HOSPITAL_COMMUNITY)
Admission: RE | Admit: 2019-12-06 | Discharge: 2019-12-06 | Disposition: A | Discharge status: Home / Self Care | Payer: Medicare Other | Source: Ambulatory Visit | Attending: Family Medicine | Admitting: Family Medicine

## 2019-12-06 DIAGNOSIS — Z1231 Encounter for screening mammogram for malignant neoplasm of breast: Secondary | ICD-10-CM

## 2019-12-12 NOTE — Patient Instructions (Signed)
BLAKELYN DINGES  12/12/2019     _0 @   Your procedure is scheduled on  12/18/2019.  Report to [facility] at  1030  A.M.  Call this number if you have problems the morning of surgery:  [phone]   Remember:  Follow the diet and prep instructions given to you by the office.                      Take these medicines the morning of surgery with A SIP OF WATER  Amlodipine, clonazepam, lexapro, gabapentin, hydrocodone(if needed), protonix. Use your inhalers before you come.    Do not wear jewelry, make-up or nail polish.  Do not wear lotions, powders, or perfumes. Please wear deodorant and brush your teeth.  Do not shave 48 hours prior to surgery.  Men may shave face and neck.  Do not bring valuables to the hospital.  [facility] is not responsible for any belongings or valuables.  Contacts, dentures or bridgework may not be worn into surgery.  Leave your suitcase in the car.  After surgery it may be brought to your room.  For patients admitted to the hospital, discharge time will be determined by your treatment team.  Patients discharged the day of surgery will not be allowed to drive home.   Name and phone number of your driver:   family Special instructions:   DO NOT smoke the morning of your procedure.  Please read over the following fact sheets that you were given. Anesthesia Post-op Instructions and Care and Recovery After Surgery       Upper Endoscopy, Adult, Care After This sheet gives you information about how to care for yourself after your procedure. Your health care provider may also give you more specific instructions. If you have problems or questions, contact your health care provider. What can I expect after the procedure? After the procedure, it is common to have:  A sore throat.  Mild stomach pain or discomfort.  Bloating.  Nausea. Follow these instructions at home:   Follow instructions from your health care  provider about what to eat or drink after your procedure.  Return to your normal activities as told by your health care provider. Ask your health care provider what activities are safe for you.  Take over-the-counter and prescription medicines only as told by your health care provider.  Do not drive for 24 hours if you were given a sedative during your procedure.  Keep all follow-up visits as told by your health care provider. This is important. Contact a health care provider if you have:  A sore throat that lasts longer than one day.  Trouble swallowing. Get help right away if:  You vomit blood or your vomit looks like coffee grounds.  You have: ? A fever. ? Bloody, black, or tarry stools. ? A severe sore throat or you cannot swallow. ? Difficulty breathing. ? Severe pain in your chest or abdomen. Summary  After the procedure, it is common to have a sore throat, mild stomach discomfort, bloating, and nausea.  Do not drive for 24 hours if you were given a sedative during the procedure.  Follow instructions from your health care provider about what to eat or drink after your procedure.  Return to your normal activities as told by your health care provider. This information is not intended to replace advice given to you by your health care provider. Make  sure you discuss any questions you have with your health care provider. Document Revised: 09/14/2017 Document Reviewed: 08/23/2017 Elsevier Patient Education  [place].  Esophageal Dilatation Esophageal dilatation, also called esophageal dilation, is a procedure to widen or open (dilate) a blocked or narrowed part of the esophagus. The esophagus is the part of the body that moves food and liquid from the mouth to the stomach. You may need this procedure if:  You have a buildup of scar tissue in your esophagus that makes it difficult, painful, or impossible to swallow. This can be caused by gastroesophageal reflux  disease (GERD).  You have cancer of the esophagus.  There is a problem with how food moves through your esophagus. In some cases, you may need this procedure repeated at a later time to dilate the esophagus gradually. Tell a health care provider about:  Any allergies you have.  All medicines you are taking, including vitamins, herbs, eye drops, creams, and over-the-counter medicines.  Any problems you or family members have had with anesthetic medicines.  Any blood disorders you have.  Any surgeries you have had.  Any medical conditions you have.  Any antibiotic medicines you are required to take before dental procedures.  Whether you are pregnant or may be pregnant. What are the risks? Generally, this is a safe procedure. However, problems may occur, including:  Bleeding due to a tear in the lining of the esophagus.  A hole (perforation) in the esophagus. What happens before the procedure?  Follow instructions from your health care provider about eating or drinking restrictions.  Ask your health care provider about changing or stopping your regular medicines. This is especially important if you are taking diabetes medicines or blood thinners.  Plan to have someone take you home from the hospital or clinic.  Plan to have a responsible adult care for you for at least 24 hours after you leave the hospital or clinic. This is important. What happens during the procedure?  You may be given a medicine to help you relax (sedative).  A numbing medicine may be sprayed into the back of your throat, or you may gargle the medicine.  Your health care provider may perform the dilatation using various surgical instruments, such as: ? Simple dilators. This instrument is carefully placed in the esophagus to stretch it. ? Guided wire bougies. This involves using an endoscope to insert a wire into the esophagus. A dilator is passed over this wire to enlarge the esophagus. Then the wire is  removed. ? Balloon dilators. An endoscope with a small balloon at the end is inserted into the esophagus. The balloon is inflated to stretch the esophagus and open it up. The procedure may vary among health care providers and hospitals. What happens after the procedure?  Your blood pressure, heart rate, breathing rate, and blood oxygen level will be monitored until the medicines you were given have worn off.  Your throat may feel slightly sore and numb. This will improve slowly over time.  You will not be allowed to eat or drink until your throat is no longer numb.  When you are able to drink, urinate, and sit on the edge of the bed without nausea or dizziness, you may be able to return home. Follow these instructions at home:  Take over-the-counter and prescription medicines only as told by your health care provider.  Do not drive for 24 hours if you were given a sedative during your procedure.  You should have a  responsible adult with you for 24 hours after the procedure.  Follow instructions from your health care provider about any eating or drinking restrictions.  Do not use any products that contain nicotine or tobacco, such as cigarettes and e-cigarettes. If you need help quitting, ask your health care provider.  Keep all follow-up visits as told by your health care provider. This is important. Get help right away if you:  Have a fever.  Have chest pain.  Have pain that is not relieved by medication.  Have trouble breathing.  Have trouble swallowing.  Vomit blood. Summary  Esophageal dilatation, also called esophageal dilation, is a procedure to widen or open (dilate) a blocked or narrowed part of the esophagus.  Plan to have someone take you home from the hospital or clinic.  For this procedure, a numbing medicine may be sprayed into the back of your throat, or you may gargle the medicine.  Do not drive for 24 hours if you were given a sedative during your  procedure. This information is not intended to replace advice given to you by your health care provider. Make sure you discuss any questions you have with your health care provider. Document Revised: 01/18/2019 Document Reviewed: 01/26/2017 Elsevier Patient Education  [place].  Colonoscopy, Adult, Care After This sheet gives you information about how to care for yourself after your procedure. Your health care provider may also give you more specific instructions. If you have problems or questions, contact your health care provider. What can I expect after the procedure? After the procedure, it is common to have:  A small amount of blood in your stool for 24 hours after the procedure.  Some gas.  Mild cramping or bloating of your abdomen. Follow these instructions at home: Eating and drinking   Drink enough fluid to keep your urine pale yellow.  Follow instructions from your health care provider about eating or drinking restrictions.  Resume your normal diet as instructed by your health care provider. Avoid heavy or fried foods that are hard to digest. Activity  Rest as told by your health care provider.  Avoid sitting for a long time without moving. Get up to take short walks every 1-2 hours. This is important to improve blood flow and breathing. Ask for help if you feel weak or unsteady.  Return to your normal activities as told by your health care provider. Ask your health care provider what activities are safe for you. Managing cramping and bloating   Try walking around when you have cramps or feel bloated.  Apply heat to your abdomen as told by your health care provider. Use the heat source that your health care provider recommends, such as a moist heat pack or a heating pad. ? Place a towel between your skin and the heat source. ? Leave the heat on for 20-30 minutes. ? Remove the heat if your skin turns bright red. This is especially important if you are unable  to feel pain, heat, or cold. You may have a greater risk of getting burned. General instructions  For the first 24 hours after the procedure: ? Do not drive or use machinery. ? Do not sign important documents. ? Do not drink alcohol. ? Do your regular daily activities at a slower pace than normal. ? Eat soft foods that are easy to digest.  Take over-the-counter and prescription medicines only as told by your health care provider.  Keep all follow-up visits as told by your health care  provider. This is important. Contact a health care provider if:  You have blood in your stool 2-3 days after the procedure. Get help right away if you have:  More than a small spotting of blood in your stool.  Large blood clots in your stool.  Swelling of your abdomen.  Nausea or vomiting.  A fever.  Increasing pain in your abdomen that is not relieved with medicine. Summary  After the procedure, it is common to have a small amount of blood in your stool. You may also have mild cramping and bloating of your abdomen.  For the first 24 hours after the procedure, do not drive or use machinery, sign important documents, or drink alcohol.  Get help right away if you have a lot of blood in your stool, nausea or vomiting, a fever, or increased pain in your abdomen. This information is not intended to replace advice given to you by your health care provider. Make sure you discuss any questions you have with your health care provider. Document Revised: 10/17/2018 Document Reviewed: 10/17/2018 Elsevier Patient Education  [place] After These instructions provide you with information about caring for yourself after your procedure. Your health care provider may also give you more specific instructions. Your treatment has been planned according to current medical practices, but problems sometimes occur. Call your health care provider if you have any problems or  questions after your procedure. What can I expect after the procedure? After your procedure, you may:  Feel sleepy for several hours.  Feel clumsy and have poor balance for several hours.  Feel forgetful about what happened after the procedure.  Have poor judgment for several hours.  Feel nauseous or vomit.  Have a sore throat if you had a breathing tube during the procedure. Follow these instructions at home: For at least 24 hours after the procedure:      Have a responsible adult stay with you. It is important to have someone help care for you until you are awake and alert.  Rest as needed.  Do not: ? Participate in activities in which you could fall or become injured. ? Drive. ? Use heavy machinery. ? Drink alcohol. ? Take sleeping pills or medicines that cause drowsiness. ? Make important decisions or sign legal documents. ? Take care of children on your own. Eating and drinking  Follow the diet that is recommended by your health care provider.  If you vomit, drink water, juice, or soup when you can drink without vomiting.  Make sure you have little or no nausea before eating solid foods. General instructions  Take over-the-counter and prescription medicines only as told by your health care provider.  If you have sleep apnea, surgery and certain medicines can increase your risk for breathing problems. Follow instructions from your health care provider about wearing your sleep device: ? Anytime you are sleeping, including during daytime naps. ? While taking prescription pain medicines, sleeping medicines, or medicines that make you drowsy.  If you smoke, do not smoke without supervision.  Keep all follow-up visits as told by your health care provider. This is important. Contact a health care provider if:  You keep feeling nauseous or you keep vomiting.  You feel light-headed.  You develop a rash.  You have a fever. Get help right away if:  You have  trouble breathing. Summary  For several hours after your procedure, you may feel sleepy and have poor judgment.  Have a responsible  adult stay with you for at least 24 hours or until you are awake and alert. This information is not intended to replace advice given to you by your health care provider. Make sure you discuss any questions you have with your health care provider. Document Revised: 06/21/2017 Document Reviewed: 07/14/2015 Elsevier Patient Education  [place].

## 2019-12-14 ENCOUNTER — Other Ambulatory Visit: Payer: Self-pay

## 2019-12-14 ENCOUNTER — Encounter (HOSPITAL_COMMUNITY)
Admission: RE | Admit: 2019-12-14 | Discharge: 2019-12-14 | Disposition: A | Discharge status: Home / Self Care | Payer: Medicare Other | Source: Ambulatory Visit | Attending: Internal Medicine | Admitting: Internal Medicine

## 2019-12-14 ENCOUNTER — Encounter (HOSPITAL_COMMUNITY): Payer: Self-pay

## 2019-12-14 ENCOUNTER — Other Ambulatory Visit (HOSPITAL_COMMUNITY)
Admission: RE | Admit: 2019-12-14 | Discharge: 2019-12-14 | Disposition: A | Discharge status: Home / Self Care | Payer: Medicare Other | Source: Ambulatory Visit | Attending: Internal Medicine | Admitting: Internal Medicine

## 2019-12-14 DIAGNOSIS — Z01818 Encounter for other preprocedural examination: Secondary | ICD-10-CM | POA: Diagnosis not present

## 2019-12-14 DIAGNOSIS — Z20822 Contact with and (suspected) exposure to covid-19: Secondary | ICD-10-CM | POA: Insufficient documentation

## 2019-12-14 LAB — BASIC METABOLIC PANEL
Anion gap: 10 (ref 5–15)
BUN: 14 mg/dL (ref 8–23)
CO2: 28 mmol/L (ref 22–32)
Calcium: 9.1 mg/dL (ref 8.9–10.3)
Chloride: 97 mmol/L — ABNORMAL LOW (ref 98–111)
Creatinine, Ser: 0.66 mg/dL (ref 0.44–1.00)
GFR calc Af Amer: >60 mL/min (ref >60)
GFR calc non Af Amer: >60 mL/min (ref >60)
Glucose, Bld: 78 mg/dL (ref 70–99)
Potassium: 4.1 mmol/L (ref 3.5–5.1)
Sodium: 135 mmol/L (ref 135–145)

## 2019-12-14 LAB — SARS CORONAVIRUS 2 (TAT 6-24 HRS): SARS Coronavirus 2: NEGATIVE

## 2019-12-18 ENCOUNTER — Encounter (HOSPITAL_COMMUNITY)
Admission: RE | Disposition: A | Discharge status: Home / Self Care | Payer: Self-pay | Source: Home / Self Care | Attending: Internal Medicine

## 2019-12-18 ENCOUNTER — Ambulatory Visit (HOSPITAL_COMMUNITY): Payer: Medicare Other | Admitting: Anesthesiology

## 2019-12-18 ENCOUNTER — Ambulatory Visit (HOSPITAL_COMMUNITY)
Admission: RE | Admit: 2019-12-18 | Discharge: 2019-12-18 | Disposition: A | Discharge status: Home / Self Care | Payer: Medicare Other | Attending: Internal Medicine | Admitting: Internal Medicine

## 2019-12-18 ENCOUNTER — Encounter (HOSPITAL_COMMUNITY): Payer: Self-pay | Admitting: Internal Medicine

## 2019-12-18 DIAGNOSIS — K5730 Diverticulosis of large intestine without perforation or abscess without bleeding: Secondary | ICD-10-CM | POA: Diagnosis not present

## 2019-12-18 DIAGNOSIS — K635 Polyp of colon: Secondary | ICD-10-CM | POA: Diagnosis not present

## 2019-12-18 DIAGNOSIS — Z885 Allergy status to narcotic agent status: Secondary | ICD-10-CM | POA: Diagnosis not present

## 2019-12-18 DIAGNOSIS — Z86010 Personal history of colonic polyps: Secondary | ICD-10-CM | POA: Insufficient documentation

## 2019-12-18 DIAGNOSIS — I110 Hypertensive heart disease with heart failure: Secondary | ICD-10-CM | POA: Insufficient documentation

## 2019-12-18 DIAGNOSIS — Z903 Acquired absence of stomach [part of]: Secondary | ICD-10-CM | POA: Diagnosis not present

## 2019-12-18 DIAGNOSIS — F329 Major depressive disorder, single episode, unspecified: Secondary | ICD-10-CM | POA: Diagnosis not present

## 2019-12-18 DIAGNOSIS — D124 Benign neoplasm of descending colon: Secondary | ICD-10-CM | POA: Insufficient documentation

## 2019-12-18 DIAGNOSIS — Z888 Allergy status to other drugs, medicaments and biological substances status: Secondary | ICD-10-CM | POA: Insufficient documentation

## 2019-12-18 DIAGNOSIS — I272 Pulmonary hypertension, unspecified: Secondary | ICD-10-CM | POA: Diagnosis not present

## 2019-12-18 DIAGNOSIS — R1314 Dysphagia, pharyngoesophageal phase: Secondary | ICD-10-CM | POA: Diagnosis not present

## 2019-12-18 DIAGNOSIS — K6389 Other specified diseases of intestine: Secondary | ICD-10-CM | POA: Insufficient documentation

## 2019-12-18 DIAGNOSIS — I509 Heart failure, unspecified: Secondary | ICD-10-CM | POA: Insufficient documentation

## 2019-12-18 DIAGNOSIS — M1990 Unspecified osteoarthritis, unspecified site: Secondary | ICD-10-CM | POA: Insufficient documentation

## 2019-12-18 DIAGNOSIS — J449 Chronic obstructive pulmonary disease, unspecified: Secondary | ICD-10-CM | POA: Insufficient documentation

## 2019-12-18 DIAGNOSIS — K219 Gastro-esophageal reflux disease without esophagitis: Secondary | ICD-10-CM | POA: Insufficient documentation

## 2019-12-18 DIAGNOSIS — F419 Anxiety disorder, unspecified: Secondary | ICD-10-CM | POA: Diagnosis not present

## 2019-12-18 DIAGNOSIS — E079 Disorder of thyroid, unspecified: Secondary | ICD-10-CM | POA: Diagnosis not present

## 2019-12-18 DIAGNOSIS — Z1211 Encounter for screening for malignant neoplasm of colon: Secondary | ICD-10-CM | POA: Insufficient documentation

## 2019-12-18 DIAGNOSIS — Z9981 Dependence on supplemental oxygen: Secondary | ICD-10-CM | POA: Diagnosis not present

## 2019-12-18 DIAGNOSIS — Z79899 Other long term (current) drug therapy: Secondary | ICD-10-CM | POA: Diagnosis not present

## 2019-12-18 DIAGNOSIS — R1310 Dysphagia, unspecified: Secondary | ICD-10-CM | POA: Diagnosis not present

## 2019-12-18 DIAGNOSIS — F17210 Nicotine dependence, cigarettes, uncomplicated: Secondary | ICD-10-CM | POA: Diagnosis not present

## 2019-12-18 HISTORY — PX: ESOPHAGOGASTRODUODENOSCOPY (EGD) WITH PROPOFOL: SHX5813

## 2019-12-18 HISTORY — PX: BIOPSY: SHX5522

## 2019-12-18 HISTORY — PX: MALONEY DILATION: SHX5535

## 2019-12-18 HISTORY — PX: POLYPECTOMY: SHX5525

## 2019-12-18 HISTORY — PX: COLONOSCOPY WITH PROPOFOL: SHX5780

## 2019-12-18 SURGERY — COLONOSCOPY WITH PROPOFOL
Anesthesia: General

## 2019-12-18 MED ORDER — PROPOFOL 10 MG/ML IV BOLUS
INTRAVENOUS | Status: DC | PRN
  Administered 2019-12-18 (×3): 20 mg via INTRAVENOUS

## 2019-12-18 MED ORDER — LIDOCAINE VISCOUS HCL 2 % MT SOLN
15.0000 mL | Freq: Once | OROMUCOSAL | Status: AC
  Administered 2019-12-18: 15 mL via OROMUCOSAL

## 2019-12-18 MED ORDER — GLYCOPYRROLATE 0.2 MG/ML IJ SOLN
0.2000 mg | Freq: Once | INTRAMUSCULAR | Status: AC
  Administered 2019-12-18: 0.2 mg via INTRAVENOUS

## 2019-12-18 MED ORDER — GLYCOPYRROLATE 0.2 MG/ML IJ SOLN
INTRAMUSCULAR | Status: AC
  Filled 2019-12-18: qty 1

## 2019-12-18 MED ORDER — STERILE WATER FOR IRRIGATION IR SOLN
Status: DC | PRN
  Administered 2019-12-18: 1.5 mL

## 2019-12-18 MED ORDER — LIDOCAINE VISCOUS HCL 2 % MT SOLN
OROMUCOSAL | Status: AC
  Filled 2019-12-18: qty 15

## 2019-12-18 MED ORDER — PROPOFOL 500 MG/50ML IV EMUL
INTRAVENOUS | Status: DC | PRN
  Administered 2019-12-18: 150 ug/kg/min via INTRAVENOUS

## 2019-12-18 MED ORDER — LACTATED RINGERS IV SOLN
Freq: Once | INTRAVENOUS | Status: AC
  Administered 2019-12-18: 1000 mL via INTRAVENOUS

## 2019-12-18 MED ORDER — CHLORHEXIDINE GLUCONATE CLOTH 2 % EX PADS: 6.0000 | MEDICATED_PAD | Freq: Once | CUTANEOUS | Status: DC

## 2019-12-18 MED ORDER — LACTATED RINGERS IV SOLN: INTRAVENOUS | Status: DC | PRN

## 2019-12-18 NOTE — Transfer of Care (Signed)
Immediate Anesthesia Transfer of Care Note  Patient: [name]  Procedure(s) Performed: COLONOSCOPY WITH PROPOFOL (N/A ) ESOPHAGOGASTRODUODENOSCOPY (EGD) WITH PROPOFOL (N/A ) MALONEY DILATION (N/A ) BIOPSY POLYPECTOMY  Patient Location: PACU  Anesthesia Type:MAC  Level of Consciousness: drowsy and patient cooperative  Airway & Oxygen Therapy: Patient Spontanous Breathing and Patient connected to face mask oxygen  Post-op Assessment: Report given to RN and Post -op Vital signs reviewed and stable  Post vital signs: Reviewed and stable  Last Vitals:  Vitals Value Taken Time  BP 119/64 12/18/19 1320  Temp    Pulse 92   Resp 19 12/18/19 1321  SpO2 99%   Vitals shown include unvalidated device data.  Last Pain:  Vitals:   12/18/19 1221  TempSrc:   PainSc: 0-No pain      Patients Stated Pain Goal: 8 (81/19/14 7829)  Complications: No complications documented.

## 2019-12-18 NOTE — Anesthesia Preprocedure Evaluation (Signed)
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 on medications Normal cardiovascular exam Rhythm:Regular Rate:Normal  1. The left ventricle has normal systolic function with an ejection  fraction of 60-65%. The cavity size was normal. There is mildly increased  left ventricular wall thickness. Left ventricular diastolic Doppler  parameters are consistent with impaired  relaxation. No evidence of left ventricular regional wall motion  abnormalities.  2. The right ventricle has mildly reduced systolic function. The cavity  was moderately enlarged. There is no increase in right ventricular wall  thickness. D-shaped interventricular septum suggestive of RV  pressure/volume overload.  3. Left atrial size was mildly dilated.  4. Right atrial size was mildly dilated.  5. Trivial pericardial effusion is present.  6. Tricuspid valve regurgitation is mild-moderate.  7. The aortic valve is tricuspid. Mild calcification of the aortic valve.  No stenosis of the aortic valve.  8. The aortic root is normal in size and structure.  9. Normal IVC size. PA systolic pressure 45 mmHg.    Neuro/Psych PSYCHIATRIC DISORDERS Depression    GI/Hepatic Neg liver ROS, GERD  Medicated and Poorly Controlled,  Endo/Other  negative endocrine ROS  Renal/GU negative Renal ROS      Musculoskeletal  (+) Arthritis ,   Abdominal   Peds  Hematology negative hematology ROS (+)   Anesthesia Other Findings   Reproductive/Obstetrics                            Anesthesia Physical Anesthesia Plan  ASA: III  Anesthesia Plan: General   Post-op Pain Management:    Induction:   PONV Risk Score and Plan: TIVA  Airway Management Planned: Nasal Cannula and Natural Airway  Additional Equipment:   Intra-op Plan:   Post-operative Plan:   Informed Consent: I have reviewed the patients History and Physical, chart, labs and discussed the procedure including the risks, benefits and alternatives for the proposed anesthesia with the patient or authorized representative who has indicated his/her understanding and acceptance.       Plan Discussed with: CRNA and Surgeon  Anesthesia Plan Comments:         Anesthesia Quick Evaluation

## 2019-12-18 NOTE — Anesthesia Postprocedure Evaluation (Signed)
Anesthesia Post Note  Patient: RANAE CASEBIER  Procedure(s) Performed: COLONOSCOPY WITH PROPOFOL (N/A ) ESOPHAGOGASTRODUODENOSCOPY (EGD) WITH PROPOFOL (N/A ) MALONEY DILATION (N/A ) BIOPSY POLYPECTOMY  Patient location during evaluation: PACU Anesthesia Type: General Level of consciousness: awake and alert and oriented Pain management: pain level controlled Vital Signs Assessment: post-procedure vital signs reviewed and stable Respiratory status: spontaneous breathing and respiratory function stable Cardiovascular status: blood pressure returned to baseline Postop Assessment: no apparent nausea or vomiting Anesthetic complications: no   No complications documented.   Last Vitals:  Vitals:   12/18/19 1345 12/18/19 1357  BP: (!) 152/75 (!) 155/73  Pulse:  72  Resp: 16 18  Temp:  36.6 C  SpO2: 95% 96%    Last Pain:  Vitals:   12/18/19 1357  TempSrc: Oral  PainSc: 7                  Candice Tobey C Marissa Weaver

## 2019-12-18 NOTE — H&P (Signed)
_0 @   Primary Care Physician:  Lemmie Evens, MD Primary Gastroenterologist:  Dr. Gala Romney  Pre-Procedure History & Physical: HPI:  [name] is a 74 y.o. female here for surveillance colonoscopy.  History colonic adenoma.  History of esophageal dysphagia for EGD with esophageal dilation.  GERD well-controlled on pantoprazole. Past Medical History:  Diagnosis Date  . Arthritis   . Chronic back pain   . COPD (chronic obstructive pulmonary disease) ([place])    patient is on oxygen at night when needed  . Depression   . GERD (gastroesophageal reflux disease)   . 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 Hypertension   . Pulmonary HTN ([place])   . Thyroid disease   . Ulcer    [place]    Past Surgical History:  Procedure Laterality Date  . ABDOMINAL HYSTERECTOMY    . BACK SURGERY     X4  . BILROTH I PROCEDURE    . CHOLECYSTECTOMY    . COLONOSCOPY   05/18/2003   EXH:[id number] colonoscopy/ Internal hemorrhoids.  Otherwise, normal rectum  . COLONOSCOPY  08/12/2010   Dr. Arnoldo Morale: cecum visualized and normal, colon and rectum normal. Torturous colon  . COLONOSCOPY N/A 09/07/2012   RMR: tubular adenoma, lipoma. Due for surveillance in 2021  . ESOPHAGOGASTRODUODENOSCOPY  05/18/2003   RMR:. Normal esophagus/Adenomatous-appearing mucosa at the anastomosis  . ESOPHAGOGASTRODUODENOSCOPY  08/12/2010   Dr. Arnoldo Morale: anastomosis widely patent, no ulcerations, CLO test negative  . RIGHT HEART CATH N/A 05/04/2018   Procedure: RIGHT HEART CATH;  Surgeon: Larey Dresser, MD;  Location: [facility];  Service: Cardiovascular;  Laterality: N/A;  . STOMACH SURGERY     removed partial stomach  . YAG LASER APPLICATION Left 81/04/7508   Procedure: YAG LASER APPLICATION;  Surgeon: [name], MD;  Location: [facility];  Service: Ophthalmology;  Laterality: Left;  . YAG LASER APPLICATION Right 25/85/2778   Procedure: YAG LASER APPLICATION;  Surgeon: [name], MD;  Location: [facility];  Service:  Ophthalmology;  Laterality: Right;    Prior to Admission medications   Medication Sig Start Date End Date Taking? Authorizing Provider  amLODipine (NORVASC) 5 MG tablet Take 1 tablet (5 mg total) by mouth daily. 02/23/19  Yes Larey Dresser, MD  clonazePAM (KLONOPIN) 1 MG tablet Take 1 mg by mouth 3 (three) times daily as needed for anxiety.    Yes [provider]  COMBIVENT RESPIMAT 20-100 MCG/ACT AERS respimat Inhale 2 puffs into the lungs 2 (two) times daily. 07/11/19  Yes Larey Dresser, MD  escitalopram (LEXAPRO) 10 MG tablet Take 10 mg by mouth daily.  07/15/18  Yes [provider]  furosemide (LASIX) 40 MG tablet Take 1 tablet (40 mg total) by mouth daily. 10/18/19  Yes Larey Dresser, MD  gabapentin (NEURONTIN) 300 MG capsule Take 300 mg by mouth 3 (three) times daily.   Yes [provider]  HYDROcodone-acetaminophen (NORCO) 10-325 MG tablet Take 1 tablet by mouth every 4 (four) hours as needed for moderate pain.  01/22/17  Yes [provider]  OXYGEN Inhale 2 L into the lungs at bedtime.   Yes [provider]  pantoprazole (PROTONIX) 40 MG tablet Take 40 mg by mouth at bedtime.  08/24/12  Yes [provider]  potassium chloride (KLOR-CON) 10 MEQ tablet TAKE 1 TABLET BY MOUTH TWICE DAILY. Patient taking differently: Take 10 mEq by mouth 2 (two) times daily.  06/28/19  Yes Larey Dresser, MD  [facility]  HFA 108 (90 Base) MCG/ACT inhaler Inhale 2 puffs into the lungs every 4 (four) hours as needed for wheezing or shortness of breath.  11/10/19  Yes [provider]  Treprostinil (TYVASO) 0.6 MG/ML SOLN Inhale 18 mcg into the lungs 4 (four) times daily. Patient taking differently: Inhale 18 mcg into the lungs 4 (four) times daily as needed (shortness of breath).  06/20/19  Yes Larey Dresser, MD  ibuprofen (ADVIL,MOTRIN) 200 MG tablet Take 400 mg by mouth 2 (two) times daily as needed (pain). Patient not taking: Reported on 12/06/2019     [provider]    Allergies as of 11/10/2019 - Review Complete 10/20/2019  Allergen Reaction Noted  . Chantix [varenicline] Nausea And Vomiting 12/01/2016  . Codeine Other (See Comments) 12/01/2016  . Oxycodone Palpitations 01/11/2017    Family History  Problem Relation Age of Onset  . Cancer Sister        breast cancer  . Cancer Brother 26       esophageal cancer  . Cancer Maternal Aunt        pancreas?  . Cancer Paternal Aunt        multiple aunts had cancer  . Colon cancer Neg Hx     Social History   Socioeconomic History  . Marital status: Married    Spouse name: Not on file  . Number of children: Not on file  . Years of education: Not on file  . Highest education level: Not on file  Occupational History  . Not on file  Tobacco Use  . Smoking status: Current Every Day Smoker    Packs/day: 0.30    Years: 15.00    Pack years: 4.50    Types: Cigarettes  . Smokeless tobacco: Never Used  Vaping Use  . Vaping Use: Never used  Substance and Sexual Activity  . Alcohol use: No  . Drug use: No  . Sexual activity: Not on file  Other Topics Concern  . Not on file  Social History Narrative  . Not on file   Social Determinants of Health   Financial Resource Strain:   . Difficulty of Paying Living Expenses: Not on file  Food Insecurity:   . Worried About Charity fundraiser in the Last Year: Not on file  . Ran Out of Food in the Last Year: Not on file  Transportation Needs:   . Lack of Transportation (Medical): Not on file  . Lack of Transportation (Non-Medical): Not on file  Physical Activity:   . Days of Exercise per Week: Not on file  . Minutes of Exercise per Session: Not on file  Stress:   . Feeling of Stress : Not on file  Social Connections:   . Frequency of Communication with Friends and Family: Not on file  . Frequency of Social Gatherings with Friends and Family: Not on file  . Attends Religious Services: Not on file  . Active Member of  Clubs or Organizations: Not on file  . Attends Archivist Meetings: Not on file  . Marital Status: Not on file  Intimate Partner Violence:   . Fear of Current or Ex-Partner: Not on file  . Emotionally Abused: Not on file  . Physically Abused: Not on file  . Sexually Abused: Not on file    Review of Systems: See HPI, otherwise negative ROS  Physical Exam: BP 113/61   Temp 98.6 F (37 C) (Oral)   Resp 20   SpO2 94%  General:   Alert,  Well-developed, well-nourished, pleasant and cooperative in NAD Neck:  Supple; no masses or thyromegaly. No significant cervical adenopathy. Lungs:  Clear throughout to auscultation.   No wheezes, crackles, or rhonchi. No acute distress. Heart:  Regular rate and rhythm; no murmurs, clicks, rubs,  or gallops. Abdomen: Non-distended, normal bowel sounds.  Soft and nontender without appreciable mass or hepatosplenomegaly.  Pulses:  Normal pulses noted. Extremities:  Without clubbing or edema.  Impression/Plan: Esophageal dysphagia.  History of a prior gastric surgery.  History of colonic polyp.  Here for EGD with possible esophageal dilation and colonoscopy per plan. The risks, benefits, limitations, imponderables and alternatives regarding both EGD and colonoscopy have been reviewed with the patient. Questions have been answered. All parties agreeable.      Notice: This dictation was prepared with Dragon dictation along with smaller phrase technology. Any transcriptional errors that result from this process are unintentional and may not be corrected upon review.

## 2019-12-18 NOTE — Progress Notes (Signed)
Called and left message on sons phone that pt had just went into procedure and was doing well.

## 2019-12-18 NOTE — Discharge Instructions (Signed)
Colonoscopy Discharge Instructions  Read the instructions outlined below and refer to this sheet in the next few weeks. These discharge instructions provide you with general information on caring for yourself after you leave the hospital. Your doctor may also give you specific instructions. While your treatment has been planned according to the most current medical practices available, unavoidable complications occasionally occur. If you have any problems or questions after discharge, call Dr. Gala Romney at [phone]. ACTIVITY  You may resume your regular activity, but move at a slower pace for the next 24 hours.   Take frequent rest periods for the next 24 hours.   Walking will help get rid of the air and reduce the bloated feeling in your belly (abdomen).   No driving for 24 hours (because of the medicine (anesthesia) used during the test).    Do not sign any important legal documents or operate any machinery for 24 hours (because of the anesthesia used during the test).  NUTRITION  Drink plenty of fluids.   You may resume your normal diet as instructed by your doctor.   Begin with a light meal and progress to your normal diet. Heavy or fried foods are harder to digest and may make you feel sick to your stomach (nauseated).   Avoid alcoholic beverages for 24 hours or as instructed.  MEDICATIONS  You may resume your normal medications unless your doctor tells you otherwise.  WHAT YOU CAN EXPECT TODAY  Some feelings of bloating in the abdomen.   Passage of more gas than usual.   Spotting of blood in your stool or on the toilet paper.  IF YOU HAD POLYPS REMOVED DURING THE COLONOSCOPY:  No aspirin products for 7 days or as instructed.   No alcohol for 7 days or as instructed.   Eat a soft diet for the next 24 hours.  FINDING OUT THE RESULTS OF YOUR TEST Not all test results are available during your visit. If your test results are not back during the visit, make an appointment  with your caregiver to find out the results. Do not assume everything is normal if you have not heard from your caregiver or the medical facility. It is important for you to follow up on all of your test results.  SEEK IMMEDIATE MEDICAL ATTENTION IF:  You have more than a spotting of blood in your stool.   Your belly is swollen (abdominal distention).   You are nauseated or vomiting.   You have a temperature over 101.   You have abdominal pain or discomfort that is severe or gets worse throughout the day.  EGD Discharge instructions Please read the instructions outlined below and refer to this sheet in the next few weeks. These discharge instructions provide you with general information on caring for yourself after you leave the hospital. Your doctor may also give you specific instructions. While your treatment has been planned according to the most current medical practices available, unavoidable complications occasionally occur. If you have any problems or questions after discharge, please call your doctor. ACTIVITY  You may resume your regular activity but move at a slower pace for the next 24 hours.   Take frequent rest periods for the next 24 hours.   Walking will help expel (get rid of) the air and reduce the bloated feeling in your abdomen.   No driving for 24 hours (because of the anesthesia (medicine) used during the test).   You may shower.   Do not sign any important  legal documents or operate any machinery for 24 hours (because of the anesthesia used during the test).  NUTRITION  Drink plenty of fluids.   You may resume your normal diet.   Begin with a light meal and progress to your normal diet.   Avoid alcoholic beverages for 24 hours or as instructed by your caregiver.  MEDICATIONS  You may resume your normal medications unless your caregiver tells you otherwise.  WHAT YOU CAN EXPECT TODAY  You may experience abdominal discomfort such as a feeling of fullness  or "gas" pains.  FOLLOW-UP  Your doctor will discuss the results of your test with you.  SEEK IMMEDIATE MEDICAL ATTENTION IF ANY OF THE FOLLOWING OCCUR:  Excessive nausea (feeling sick to your stomach) and/or vomiting.   Severe abdominal pain and distention (swelling).   Trouble swallowing.   Temperature over 101 F (37.8 C).   Rectal bleeding or vomiting of blood.   3 polyps removed from your colon today  Polyp and diverticulosis information provided  Your esophagus was stretched today.  Stomach was biopsied.  Further recommendations to follow pending review of pathology report  Office visit with [place] in 6 weeks  At patient request, I called Liberta Gimpel at [phone]     Diverticulosis  Diverticulosis is a condition that develops when small pouches (diverticula) form in the wall of the large intestine (colon). The colon is where water is absorbed and stool (feces) is formed. The pouches form when the inside layer of the colon pushes through weak spots in the outer layers of the colon. You may have a few pouches or many of them. The pouches usually do not cause problems unless they become inflamed or infected. When this happens, the condition is called diverticulitis. What are the causes? The cause of this condition is not known. What increases the risk? The following factors may make you more likely to develop this condition:  Being older than age 90. Your risk for this condition increases with age. Diverticulosis is rare among people younger than age 74. By age 75, many people have it.  Eating a low-fiber diet.  Having frequent constipation.  Being overweight.  Not getting enough exercise.  Smoking.  Taking over-the-counter pain medicines, like aspirin and ibuprofen.  Having a family history of diverticulosis. What are the signs or symptoms? In most people, there are no symptoms of this condition. If you do have symptoms, they may  include:  Bloating.  Cramps in the abdomen.  Constipation or diarrhea.  Pain in the lower left side of the abdomen. How is this diagnosed? Because diverticulosis usually has no symptoms, it is most often diagnosed during an exam for other colon problems. The condition may be diagnosed by:  Using a flexible scope to examine the colon (colonoscopy).  Taking an X-ray of the colon after dye has been put into the colon (barium enema).  Having a CT scan. How is this treated? You may not need treatment for this condition. Your health care provider may recommend treatment to prevent problems. You may need treatment if you have symptoms or if you previously had diverticulitis. Treatment may include:  Eating a high-fiber diet.  Taking a fiber supplement.  Taking a live bacteria supplement (probiotic).  Taking medicine to relax your colon. Follow these instructions at home: Medicines  Take over-the-counter and prescription medicines only as told by your health care provider.  If told by your health care provider, take a fiber supplement or probiotic. Constipation prevention Your  condition may cause constipation. To prevent or treat constipation, you may need to:  Drink enough fluid to keep your urine pale yellow.  Take over-the-counter or prescription medicines.  Eat foods that are high in fiber, such as beans, whole grains, and fresh fruits and vegetables.  Limit foods that are high in fat and processed sugars, such as fried or sweet foods.  General instructions  Try not to strain when you have a bowel movement.  Keep all follow-up visits as told by your health care provider. This is important. Contact a health care provider if you:  Have pain in your abdomen.  Have bloating.  Have cramps.  Have not had a bowel movement in 3 days. Get help right away if:  Your pain gets worse.  Your bloating becomes very bad.  You have a fever or chills, and your symptoms  suddenly get worse.  You vomit.  You have bowel movements that are bloody or black.  You have bleeding from your rectum. Summary  Diverticulosis is a condition that develops when small pouches (diverticula) form in the wall of the large intestine (colon).  You may have a few pouches or many of them.  This condition is most often diagnosed during an exam for other colon problems.  Treatment may include increasing the fiber in your diet, taking supplements, or taking medicines. This information is not intended to replace advice given to you by your health care provider. Make sure you discuss any questions you have with your health care provider. Document Revised: 10/20/2018 Document Reviewed: 10/20/2018 Elsevier Patient Education  [place].  Colon Polyps  Polyps are tissue growths inside the body. Polyps can grow in many places, including the large intestine (colon). A polyp may be a round bump or a mushroom-shaped growth. You could have one polyp or several. Most colon polyps are noncancerous (benign). However, some colon polyps can become cancerous over time. Finding and removing the polyps early can help prevent this. What are the causes? The exact cause of colon polyps is not known. What increases the risk? You are more likely to develop this condition if you:  Have a family history of colon cancer or colon polyps.  Are older than 97 or older than 45 if you are African American.  Have inflammatory bowel disease, such as ulcerative colitis or Crohn's disease.  Have certain hereditary conditions, such as: ? Familial adenomatous polyposis. ? Lynch syndrome. ? Turcot syndrome. ? Peutz-Jeghers syndrome.  Are overweight.  Smoke cigarettes.  Do not get enough exercise.  Drink too much alcohol.  Eat a diet that is high in fat and red meat and low in fiber.  Had childhood cancer that was treated with abdominal radiation. What are the signs or symptoms? Most  polyps do not cause symptoms. If you have symptoms, they may include:  Blood coming from your rectum when having a bowel movement.  Blood in your stool. The stool may look dark red or black.  Abdominal pain.  A change in bowel habits, such as constipation or diarrhea. How is this diagnosed? This condition is diagnosed with a colonoscopy. This is a procedure in which a lighted, flexible scope is inserted into the anus and then passed into the colon to examine the area. Polyps are sometimes found when a colonoscopy is done as part of routine cancer screening tests. How is this treated? Treatment for this condition involves removing any polyps that are found. Most polyps can be removed during a colonoscopy.  Those polyps will then be tested for cancer. Additional treatment may be needed depending on the results of testing. Follow these instructions at home: Lifestyle  Maintain a healthy weight, or lose weight if recommended by your health care provider.  Exercise every day or as told by your health care provider.  Do not use any products that contain nicotine or tobacco, such as cigarettes and e-cigarettes. If you need help quitting, ask your health care provider.  If you drink alcohol, limit how much you have: ? 0-1 drink a day for women. ? 0-2 drinks a day for men.  Be aware of how much alcohol is in your drink. In the [place] one drink equals one 12 oz bottle of beer (355 mL), one 5 oz glass of wine (148 mL), or one 1 oz shot of hard liquor (44 mL). Eating and drinking   Eat foods that are high in fiber, such as fruits, vegetables, and whole grains.  Eat foods that are high in calcium and vitamin D, such as milk, cheese, yogurt, eggs, liver, fish, and broccoli.  Limit foods that are high in fat, such as fried foods and desserts.  Limit the amount of red meat and processed meat you eat, such as hot dogs, sausage, bacon, and lunch meats. General instructions  Keep all follow-up  visits as told by your health care provider. This is important. ? This includes having regularly scheduled colonoscopies. ? Talk to your health care provider about when you need a colonoscopy. Contact a health care provider if:  You have new or worsening bleeding during a bowel movement.  You have new or increased blood in your stool.  You have a change in bowel habits.  You lose weight for no known reason. Summary  Polyps are tissue growths inside the body. Polyps can grow in many places, including the colon.  Most colon polyps are noncancerous (benign), but some can become cancerous over time.  This condition is diagnosed with a colonoscopy.  Treatment for this condition involves removing any polyps that are found. Most polyps can be removed during a colonoscopy. This information is not intended to replace advice given to you by your health care provider. Make sure you discuss any questions you have with your health care provider. Document Revised: 07/08/2017 Document Reviewed: 07/08/2017 Elsevier Patient Education  [place].   Esophageal Dilatation Esophageal dilatation, also called esophageal dilation, is a procedure to widen or open (dilate) a blocked or narrowed part of the esophagus. The esophagus is the part of the body that moves food and liquid from the mouth to the stomach. You may need this procedure if:  You have a buildup of scar tissue in your esophagus that makes it difficult, painful, or impossible to swallow. This can be caused by gastroesophageal reflux disease (GERD).  You have cancer of the esophagus.  There is a problem with how food moves through your esophagus. In some cases, you may need this procedure repeated at a later time to dilate the esophagus gradually. Tell a health care provider about:  Any allergies you have.  All medicines you are taking, including vitamins, herbs, eye drops, creams, and over-the-counter medicines.  Any problems  you or family members have had with anesthetic medicines.  Any blood disorders you have.  Any surgeries you have had.  Any medical conditions you have.  Any antibiotic medicines you are required to take before dental procedures.  Whether you are pregnant or may be pregnant. What are  the risks? Generally, this is a safe procedure. However, problems may occur, including:  Bleeding due to a tear in the lining of the esophagus.  A hole (perforation) in the esophagus. What happens before the procedure?  Follow instructions from your health care provider about eating or drinking restrictions.  Ask your health care provider about changing or stopping your regular medicines. This is especially important if you are taking diabetes medicines or blood thinners.  Plan to have someone take you home from the hospital or clinic.  Plan to have a responsible adult care for you for at least 24 hours after you leave the hospital or clinic. This is important. What happens during the procedure?  You may be given a medicine to help you relax (sedative).  A numbing medicine may be sprayed into the back of your throat, or you may gargle the medicine.  Your health care provider may perform the dilatation using various surgical instruments, such as: ? Simple dilators. This instrument is carefully placed in the esophagus to stretch it. ? Guided wire bougies. This involves using an endoscope to insert a wire into the esophagus. A dilator is passed over this wire to enlarge the esophagus. Then the wire is removed. ? Balloon dilators. An endoscope with a small balloon at the end is inserted into the esophagus. The balloon is inflated to stretch the esophagus and open it up. The procedure may vary among health care providers and hospitals. What happens after the procedure?  Your blood pressure, heart rate, breathing rate, and blood oxygen level will be monitored until the medicines you were given have worn  off.  Your throat may feel slightly sore and numb. This will improve slowly over time.  You will not be allowed to eat or drink until your throat is no longer numb.  When you are able to drink, urinate, and sit on the edge of the bed without nausea or dizziness, you may be able to return home. Follow these instructions at home:  Take over-the-counter and prescription medicines only as told by your health care provider.  Do not drive for 24 hours if you were given a sedative during your procedure.  You should have a responsible adult with you for 24 hours after the procedure.  Follow instructions from your health care provider about any eating or drinking restrictions.  Do not use any products that contain nicotine or tobacco, such as cigarettes and e-cigarettes. If you need help quitting, ask your health care provider.  Keep all follow-up visits as told by your health care provider. This is important. Get help right away if you:  Have a fever.  Have chest pain.  Have pain that is not relieved by medication.  Have trouble breathing.  Have trouble swallowing.  Vomit blood. Summary  Esophageal dilatation, also called esophageal dilation, is a procedure to widen or open (dilate) a blocked or narrowed part of the esophagus.  Plan to have someone take you home from the hospital or clinic.  For this procedure, a numbing medicine may be sprayed into the back of your throat, or you may gargle the medicine.  Do not drive for 24 hours if you were given a sedative during your procedure. This information is not intended to replace advice given to you by your health care provider. Make sure you discuss any questions you have with your health care provider. Document Revised: 01/18/2019 Document Reviewed: 01/26/2017 Elsevier Patient Education  2020 [employer].

## 2019-12-18 NOTE — Op Note (Signed)
[facility] Patient Name: [name] Procedure Date: 12/18/2019 12:40 PM MRN: [id number] Date of Birth: 04-25-1945 Attending MD: Norvel Richards , MD CSN: [id number] Age: 74 Admit Type: Ambulatory Procedure:                Colonoscopy Indications:              High risk colon cancer surveillance: Personal                            history of colonic polyps Providers:                Norvel Richards, MD, Selena Lesser RN, RN,                            Lurline Del, RN Referring MD:              Medicines:                Propofol per Anesthesia Complications:            No immediate complications. Estimated Blood Loss:     Estimated blood loss was minimal. Procedure:                Pre-Anesthesia Assessment:                           - Prior to the procedure, a History and Physical                            was performed, and patient medications and                            allergies were reviewed. The patient's tolerance of                            previous anesthesia was also reviewed. The risks                            and benefits of the procedure and the sedation                            options and risks were discussed with the patient.                            All questions were answered, and informed consent                            was obtained. Prior Anticoagulants: The patient has                            taken no previous anticoagulant or antiplatelet                            agents. ASA Grade Assessment: III - A patient with  severe systemic disease. After reviewing the risks                            and benefits, the patient was deemed in                            satisfactory condition to undergo the procedure.                           After obtaining informed consent, the colonoscope                            was passed under direct vision. Throughout the                            procedure, the  patient's blood pressure, pulse, and                            oxygen saturations were monitored continuously. The                            [id number]-[id number] ([id number]) scope was introduced through                            the anus and advanced to the the cecum, identified                            by appendiceal orifice and ileocecal valve. Scope In: 12:43:53 PM Scope Out: 1:14:59 PM Scope Withdrawal Time: 0 hours 13 minutes 26 seconds  Total Procedure Duration: 0 hours 31 minutes 6 seconds  Findings:      The perianal and digital rectal examinations were normal.      Three semi-pedunculated polyps were found in the descending colon. The       polyps were 2 to 8 mm in size. These polyps were removed with a cold       snare. Resection and retrieval were complete. Estimated blood loss was       minimal.      A few medium-mouthed diverticula were found in the entire colon. Left       colon initially noncompliant. Would not easily admit the diagnostic       adult colonoscope. I withdrew it. Obtained a pediatric colonoscope and       was able to reach the cecum without any difficulty.      The exam was otherwise without abnormality on direct and retroflexion       views. Impression:               - Three 2 to 8 mm polyps in the descending colon,                            removed with a cold snare. Resected and retrieved.                           - Diverticulosis in the entire examined colon.  Noncompliant left colon.                           - The examination was otherwise normal on direct                            and retroflexion views. Moderate Sedation:      Moderate (conscious) sedation was personally administered by an       anesthesia professional. The following parameters were monitored: oxygen       saturation, heart rate, blood pressure, respiratory rate, EKG, adequacy       of pulmonary ventilation, and response to care. Recommendation:           -  Patient has a contact number available for                            emergencies. The signs and symptoms of potential                            delayed complications were discussed with the                            patient. Return to normal activities tomorrow.                            Written discharge instructions were provided to the                            patient.                           - Resume previous diet.                           - Continue present medications.                           - Repeat colonoscopy date to be determined after                            pending pathology results are reviewed for                            surveillance.                           - Return to GI office in 6 weeks. See EGD report. Procedure Code(s):        --- Professional ---                           [phone], Colonoscopy, flexible; with removal of                            tumor(s), polyp(s), or other lesion(s) by snare  technique Diagnosis Code(s):        --- Professional ---                           Z86.010, Personal history of colonic polyps                           K63.5, Polyp of colon                           K57.30, Diverticulosis of large intestine without                            perforation or abscess without bleeding CPT copyright 2019 American Medical Association. All rights reserved. The codes documented in this report are preliminary and upon coder review may  be revised to meet current compliance requirements. [name]. Molly Bayon, MD Norvel Richards, MD 12/18/2019 1:21:01 PM This report has been signed electronically. Number of Addenda: 0

## 2019-12-18 NOTE — Op Note (Signed)
[facility] Patient Name: [name] Procedure Date: 12/18/2019 11:26 AM MRN: [id number] Date of Birth: 07-13-1945 Attending MD: Norvel Richards , MD CSN: [id number] Age: 75 Admit Type: Ambulatory Procedure:                Upper GI endoscopy Indications:              Dysphagia Providers:                Norvel Richards, MD, Charlsie Quest. Theda Sers RN, RN,                            Lurline Del, RN Referring MD:              Medicines:                Propofol per Anesthesia Complications:            No immediate complications. Estimated Blood Loss:     Estimated blood loss was minimal. Procedure:                After obtaining informed consent, the endoscope was                            passed under direct vision. Throughout the                            procedure, the patient's blood pressure, pulse, and                            oxygen saturations were monitored continuously. The                            [id number]) was introduced through the and                            advanced to the efferent jejunal loop. Scope In: 12:26:46 PM Scope Out: 12:37:04 PM Total Procedure Duration: 0 hours 10 minutes 18 seconds  Findings:      The examined esophagus was normal.      Surgically altered stomach. [name] 1 configuration. Normal-appearing       residual gastric mucosa. Patent anastomosis. Single 1 cm somewhat       adenomatous appearing nodule just on the small bowel side of the       anastomosis. Please see photos. Otherwise, the efferenrt limb appeared       normal.The scope was withdrawn. Dilation was performed with a Maloney       dilator with mild resistance at 71 Fr. The dilation site was examined       following endoscope reinsertion and showed no change. Estimated blood       loss: none. Finally, the effernt limb nodule was biopsied. Impression:               - Normal esophagus. Dilated.                           -Status post prior  hemigastrectomy. Small bowel  nodule of uncertain significance?"biopsied Moderate Sedation:      Moderate (conscious) sedation was personally administered by an       anesthesia professional. The following parameters were monitored: oxygen       saturation, heart rate, blood pressure, respiratory rate, EKG, adequacy       of pulmonary ventilation, and response to care. Recommendation:           - Patient has a contact number available for                            emergencies. The signs and symptoms of potential                            delayed complications were discussed with the                            patient. Return to normal activities tomorrow.                            Written discharge instructions were provided to the                            patient.                           - Advance diet as tolerated.                           - Continue present medications.                           - Return to GI office in 6 weeks. Follow-up on                            pathology. See colonoscopy report. Procedure Code(s):        --- Professional ---                           [phone], Esophagogastroduodenoscopy, flexible,                            transoral; diagnostic, including collection of                            specimen(s) by brushing or washing, when performed                            (separate procedure)                           43450, Dilation of esophagus, by unguided sound or                            bougie, single or multiple passes Diagnosis Code(s):        --- Professional ---  R13.10, Dysphagia, unspecified CPT copyright 2019 American Medical Association. All rights reserved. The codes documented in this report are preliminary and upon coder review may  be revised to meet current compliance requirements. [name]. Sherena Machorro, MD Norvel Richards, MD 12/18/2019 1:17:46 PM This report has been signed  electronically. Number of Addenda: 0

## 2019-12-19 ENCOUNTER — Encounter (HOSPITAL_COMMUNITY): Payer: Self-pay | Admitting: Internal Medicine

## 2019-12-19 LAB — SURGICAL PATHOLOGY

## 2019-12-21 ENCOUNTER — Encounter: Payer: Self-pay | Admitting: Internal Medicine

## 2020-01-17 ENCOUNTER — Ambulatory Visit (HOSPITAL_BASED_OUTPATIENT_CLINIC_OR_DEPARTMENT_OTHER)
Admission: RE | Admit: 2020-01-17 | Discharge: 2020-01-17 | Disposition: A | Discharge status: Home / Self Care | Payer: Medicare Other | Source: Ambulatory Visit

## 2020-01-17 ENCOUNTER — Other Ambulatory Visit: Payer: Self-pay

## 2020-01-17 ENCOUNTER — Encounter (HOSPITAL_COMMUNITY): Payer: Self-pay | Admitting: Cardiology

## 2020-01-17 ENCOUNTER — Other Ambulatory Visit (HOSPITAL_COMMUNITY): Payer: Medicare Other

## 2020-01-17 ENCOUNTER — Ambulatory Visit (HOSPITAL_COMMUNITY)
Admission: RE | Admit: 2020-01-17 | Discharge: 2020-01-17 | Disposition: A | Discharge status: Home / Self Care | Payer: Medicare Other | Source: Ambulatory Visit | Attending: Cardiology | Admitting: Cardiology

## 2020-01-17 VITALS — BP 160/70 | HR 66 | Ht 64.0 in | Wt 157.4 lb

## 2020-01-17 DIAGNOSIS — Z79899 Other long term (current) drug therapy: Secondary | ICD-10-CM | POA: Insufficient documentation

## 2020-01-17 DIAGNOSIS — F17210 Nicotine dependence, cigarettes, uncomplicated: Secondary | ICD-10-CM | POA: Insufficient documentation

## 2020-01-17 DIAGNOSIS — K219 Gastro-esophageal reflux disease without esophagitis: Secondary | ICD-10-CM | POA: Insufficient documentation

## 2020-01-17 DIAGNOSIS — I50810 Right heart failure, unspecified: Secondary | ICD-10-CM | POA: Diagnosis not present

## 2020-01-17 DIAGNOSIS — I272 Pulmonary hypertension, unspecified: Secondary | ICD-10-CM | POA: Diagnosis not present

## 2020-01-17 DIAGNOSIS — J449 Chronic obstructive pulmonary disease, unspecified: Secondary | ICD-10-CM | POA: Diagnosis not present

## 2020-01-17 DIAGNOSIS — I110 Hypertensive heart disease with heart failure: Secondary | ICD-10-CM | POA: Diagnosis present

## 2020-01-17 DIAGNOSIS — I509 Heart failure, unspecified: Secondary | ICD-10-CM | POA: Insufficient documentation

## 2020-01-17 DIAGNOSIS — I5032 Chronic diastolic (congestive) heart failure: Secondary | ICD-10-CM | POA: Diagnosis not present

## 2020-01-17 LAB — ECHOCARDIOGRAM COMPLETE
Area-P 1/2: 2.76 cm2
Calc EF: 62 %
S' Lateral: 3.1 cm
Single Plane A2C EF: 59.7 %
Single Plane A4C EF: 65.2 %

## 2020-01-17 MED ORDER — AMLODIPINE BESYLATE 10 MG PO TABS
10.0000 mg | ORAL_TABLET | Freq: Every day | ORAL | 6 refills | Status: DC
Start: 2020-01-17 — End: 2020-08-08

## 2020-01-17 NOTE — Patient Instructions (Signed)
Increase Amlodipine to 10 mg Daily  Your physician recommends that you schedule a follow-up appointment in: 4 months  If you have any questions or concerns before your next appointment please send [place] a message through [place] or call our office at [phone].    TO LEAVE A MESSAGE FOR THE NURSE SELECT OPTION 2, PLEASE LEAVE A MESSAGE INCLUDING: . YOUR NAME . DATE OF BIRTH . CALL BACK NUMBER . REASON FOR CALL**this is important as we prioritize the call backs  [place] AS LONG AS YOU CALL BEFORE 4:00 PM  At the [facility], you and your health needs are our priority. As part of our continuing mission to provide you with exceptional heart care, we have created designated Provider Care Teams. These Care Teams include your primary Cardiologist (physician) and Advanced Practice Providers (APPs- Physician Assistants and Nurse Practitioners) who all work together to provide you with the care you need, when you need it.   You may see any of the following providers on your designated Care Team at your next follow up: Marland Kitchen Dr Glori Bickers . Dr Loralie Champagne . Darrick Grinder, NP . Lyda Jester, PA . Audry Riles, PharmD   Please be sure to bring in all your medications bottles to every appointment.

## 2020-01-17 NOTE — Progress Notes (Signed)
  Echocardiogram 2D Echocardiogram has been performed.  Geoffery Lyons Swaim 01/17/2020, 1:39 PM

## 2020-01-17 NOTE — Progress Notes (Signed)
PCP: Dr. Karie Kirks Cardiology: Dr. Harrington Challenger HF Cardiology: Dr. Aundra Dubin  74 y.o. smoker with COPD and HTN presents for followup of pulmonary hypertension.  Echo in 1/20 showed severe RV dilation and dysfunction.  RHC in 1/20 showed moderate pulmonary hypertension with elevated PVR and low cardiac output.  PCWP was normal.  PFTs showed moderate-severe obstruction.  V/Q scan showed no chronic PE. PFTs showed moderate to severe obstruction/COPD.    She was started on Opsumit given concern for pulmonary hypertension out of proportion to lung disease.  She had to stop this due to side effects.  She started on tadalafil and did well on it with improved symptoms.   Echo in 7/20 showed EF 60-65% with moderate RV dilation and mildly decreased systolic function, D-shaped septum, mild-moderate TR, PASP down to 45 mmHg (76 mmHg on last echo).  She developed angioedema, hard to tell if it was from spironolactone or tadalafil, so she is off both medications.    In 2/21, she was seen in the ER with dyspnea and found to have possible RLL PNA.  She was treated with levofloxacin.   She did not tolerate Tyvaso and has stopped it.   Echo was done today and reviewed, EF 60-65%, normal RV, PASP 54 mmHg, normal IVC.   She returns today for followup of CHF/pulmonary hypertension.  She is still smoking about 5 cigarettes/day, gradually cutting back.  BP is elevated today. Breathing is stable, denies dyspnea walking on flat ground.  No chest pain.  No orthopnea/PND. She has not been using oxygen.   6 minute walk (2/20): 298 m 6 minute walk (7/20): 152 m 6 minute walk (9/20): 304 m 6 minute walk (2/21): 249 m  Labs (1/20): K 4, creatinine 0.8, TSH normal, BNP 994 Labs (2/20): HIV negative, SCL-70 negative, centromere Ab negative, RF negative Labs (5/20): BNP 140, ANA negative, K 3.9, creatinine 0.74 Labs (7/20): K 4.1, creatinine 0.77 Labs (9/20): K 4.1, creatinine 0.79 Labs (2/21): BNP 685, K 3.8, creatinine 0.46 Labs  (4/21): K 4.4, creatinine 0.52 Labs (9/21): K 4.1, creatinine 0.66  PMH: 1. HTN 2. COPD: Active smoker.  - PFTs (2/20): FVC 51%, FEV1 57%, ratio 72%, TLC 93%, DLCO 51% => moderate-severe obstructive disease.  3. H/o cholecystectomy 4. OA 5. H/o back surgery 6. Pulmonary hypertension:  - Echo (1/20): EF 60-65%, mild LVH, severe RV dilation with severely decreased RV systolic function, PASP 76 mmHg.  - RHC (1/20): mean RA 10, PA 55/21 mean 36, mean PCWP 7, CI 1.6, PVR 10 WU.  - V/Q scan (2/20): Not suggestive of chronic PE.  - CTA chest (1/20): No PE, no evidence for ILD.  - PFTs (2/20): Moderate to severe obstructive lung disease. - Echo (7/20): EF 60-65%, with moderate RV dilation and mildly decreased systolic function, D-shaped septum, mild-moderate TR, PASP down to 45 mmHg (76 mmHg on last echo) - Unable to tolerate Opsumit.  - Possible angioedema with tadalafil.  - Unable to tolerate Tyvaso - Echo (10/21): EF 60-65%, normal RV, PASP 54 mmHg, normal IVC.   Family History  Problem Relation Age of Onset  . Cancer Sister        breast cancer  . Cancer Brother 1       esophageal cancer  . Cancer Maternal Aunt        pancreas?  . Cancer Paternal Aunt        multiple aunts had cancer  . Colon cancer Neg Hx    Social History  Socioeconomic History  . Marital status: Married    Spouse name: Not on file  . Number of children: Not on file  . Years of education: Not on file  . Highest education level: Not on file  Occupational History  . Not on file  Tobacco Use  . Smoking status: Current Every Day Smoker    Packs/day: 0.30    Years: 15.00    Pack years: 4.50    Types: Cigarettes  . Smokeless tobacco: Never Used  Vaping Use  . Vaping Use: Never used  Substance and Sexual Activity  . Alcohol use: No  . Drug use: No  . Sexual activity: Not on file  Other Topics Concern  . Not on file  Social History Narrative  . Not on file   Social Determinants of Health    Financial Resource Strain:   . Difficulty of Paying Living Expenses: Not on file  Food Insecurity:   . Worried About Charity fundraiser in the Last Year: Not on file  . Ran Out of Food in the Last Year: Not on file  Transportation Needs:   . Lack of Transportation (Medical): Not on file  . Lack of Transportation (Non-Medical): Not on file  Physical Activity:   . Days of Exercise per Week: Not on file  . Minutes of Exercise per Session: Not on file  Stress:   . Feeling of Stress : Not on file  Social Connections:   . Frequency of Communication with Friends and Family: Not on file  . Frequency of Social Gatherings with Friends and Family: Not on file  . Attends Religious Services: Not on file  . Active Member of Clubs or Organizations: Not on file  . Attends Archivist Meetings: Not on file  . Marital Status: Not on file  Intimate Partner Violence:   . Fear of Current or Ex-Partner: Not on file  . Emotionally Abused: Not on file  . Physically Abused: Not on file  . Sexually Abused: Not on file   ROS: All systems reviewed and negative except as per HPI.   Current Outpatient Medications  Medication Sig Dispense Refill  . amLODipine (NORVASC) 10 MG tablet Take 1 tablet (10 mg total) by mouth daily. 30 tablet 6  . clonazePAM (KLONOPIN) 1 MG tablet Take 1 mg by mouth 3 (three) times daily as needed for anxiety.     Marland Kitchen escitalopram (LEXAPRO) 10 MG tablet Take 10 mg by mouth daily.     . furosemide (LASIX) 40 MG tablet Take 1 tablet (40 mg total) by mouth daily. 30 tablet 5  . gabapentin (NEURONTIN) 300 MG capsule Take 300 mg by mouth 3 (three) times daily.    Marland Kitchen HYDROcodone-acetaminophen (NORCO) 10-325 MG tablet Take 1 tablet by mouth every 4 (four) hours as needed for moderate pain.     . pantoprazole (PROTONIX) 40 MG tablet Take 40 mg by mouth at bedtime.     . potassium chloride (KLOR-CON) 10 MEQ tablet TAKE 1 TABLET BY MOUTH TWICE DAILY. (Patient taking differently: Take  10 mEq by mouth 2 (two) times daily. ) 60 tablet 5  . PROAIR HFA 108 (90 Base) MCG/ACT inhaler Inhale 2 puffs into the lungs every 4 (four) hours as needed for wheezing or shortness of breath.     . COMBIVENT RESPIMAT 20-100 MCG/ACT AERS respimat Inhale 2 puffs into the lungs 2 (two) times daily. (Patient not taking: Reported on 01/17/2020) 4 g 2  . OXYGEN Inhale 2  L into the lungs at bedtime. (Patient not taking: Reported on 01/17/2020)    . Treprostinil (TYVASO) 0.6 MG/ML SOLN Inhale 18 mcg into the lungs 4 (four) times daily. (Patient not taking: Reported on 01/17/2020) 87 mL 11   No current facility-administered medications for this encounter.   BP (!) 160/70 (BP Location: Left Arm, Patient Position: Sitting, Cuff Size: Normal)   Pulse 66   Ht _0  (1.626 m)   Wt 71.4 kg (157 lb 6.4 oz)   SpO2 94%   BMI 27.02 kg/m  General: NAD Neck: No JVD, no thyromegaly or thyroid nodule.  Lungs: Mildly distant BS CV: Nondisplaced PMI.  Heart regular S1/S2, no S3/S4, no murmur.  No peripheral edema.  No carotid bruit.  Normal pedal pulses.  Abdomen: Soft, nontender, no hepatosplenomegaly, no distention.  Skin: Intact without lesions or rashes.  Neurologic: Alert and oriented x 3.  Psych: Normal affect. Extremities: No clubbing or cyanosis.  HEENT: Normal.   Assessment/Plan: 1. Pulmonary hypertension: Echo 1/20 with severe RV dilation/dysfunction.  RHC with moderate PH, high PVR, normal PCWP, low cardiac output.  No evidence for chronic PE by V/Q scan.  PFTs show a moderate to severe obstructive defect. Rheumatological serologies negative. She still smokes.  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  Unable to tolerate Opsumit. Her breathing was better on tadalafil, but she is now off this medication due to concern that it caused angioedema.  She did not tolerate Tyvaso. Echo today  showed normal RV function, PASP 54 mmHg.  -  She has not tolerated multiple PH medications, will keep her off for the time being as she is doing reasonably well.  2. RV failure: NYHA class II, she does not look volume overloaded on exam.   - Continue Lasix 40 mg daily, BMET today.   - She is off spironolactone with possible angioedema.   3. COPD/smoking: She continues to smoke.  She did not tolerate Chantix and Wellbutrin did not work.  She is not using oxygen.   - Encouraged her to keep work on quitting.  4. HTN: BP elevated today.  - Increase amlodipine to 10 mg daily.   Followup in 4 months.   Loralie Champagne 01/17/2020

## 2020-02-05 ENCOUNTER — Encounter: Payer: Self-pay | Admitting: Gastroenterology

## 2020-02-05 ENCOUNTER — Encounter: Payer: Self-pay | Admitting: Internal Medicine

## 2020-02-05 ENCOUNTER — Ambulatory Visit: Payer: Medicare Other | Admitting: Gastroenterology

## 2020-02-05 NOTE — Progress Notes (Deleted)
na         Primary Care Physician: Lemmie Evens, MD  Primary Gastroenterologist:  Garfield Cornea, MD   No chief complaint on file.   HPI: [name] is a 74 y.o. female here for follow-up.  EGD and colonoscopy in September 2021.  She had a normal pattern esophagus status post dilation. Evidence of prior hemigastrectomy.  Small bowel nodule biopsied, benign (mild reactive changes and focal intestinal metaplasia) but recommended 1 year follow-up EGD.  She had 3 polyps removed from the colon which were tubular adenomas.  Noncompliant left colon.  Diverticulosis.  Current Outpatient Medications  Medication Sig Dispense Refill  . amLODipine (NORVASC) 10 MG tablet Take 1 tablet (10 mg total) by mouth daily. 30 tablet 6  . clonazePAM (KLONOPIN) 1 MG tablet Take 1 mg by mouth 3 (three) times daily as needed for anxiety.     . COMBIVENT RESPIMAT 20-100 MCG/ACT AERS respimat Inhale 2 puffs into the lungs 2 (two) times daily. (Patient not taking: Reported on 01/17/2020) 4 g 2  . escitalopram (LEXAPRO) 10 MG tablet Take 10 mg by mouth daily.     . furosemide (LASIX) 40 MG tablet Take 1 tablet (40 mg total) by mouth daily. 30 tablet 5  . gabapentin (NEURONTIN) 300 MG capsule Take 300 mg by mouth 3 (three) times daily.    Marland Kitchen HYDROcodone-acetaminophen (NORCO) 10-325 MG tablet Take 1 tablet by mouth every 4 (four) hours as needed for moderate pain.     . OXYGEN Inhale 2 L into the lungs at bedtime. (Patient not taking: Reported on 01/17/2020)    . pantoprazole (PROTONIX) 40 MG tablet Take 40 mg by mouth at bedtime.     . potassium chloride (KLOR-CON) 10 MEQ tablet TAKE 1 TABLET BY MOUTH TWICE DAILY. (Patient taking differently: Take 10 mEq by mouth 2 (two) times daily. ) 60 tablet 5  . PROAIR HFA 108 (90 Base) MCG/ACT inhaler Inhale 2 puffs into the lungs every 4 (four) hours as needed for wheezing or shortness of breath.     . Treprostinil (TYVASO) 0.6 MG/ML SOLN Inhale 18 mcg into the  lungs 4 (four) times daily. (Patient not taking: Reported on 01/17/2020) 87 mL 11   No current facility-administered medications for this visit.    Allergies as of 02/05/2020 - Review Complete 12/18/2019  Allergen Reaction Noted  . Chantix [varenicline] Nausea And Vomiting 12/01/2016  . Codeine Other (See Comments) 12/01/2016  . Oxycodone Palpitations 01/11/2017    ROS:  General: Negative for anorexia, weight loss, fever, chills, fatigue, weakness. ENT: Negative for hoarseness, difficulty swallowing , nasal congestion. CV: Negative for chest pain, angina, palpitations, dyspnea on exertion, peripheral edema.  Respiratory: Negative for dyspnea at rest, dyspnea on exertion, cough, sputum, wheezing.  GI: See history of present illness. GU:  Negative for dysuria, hematuria, urinary incontinence, urinary frequency, nocturnal urination.  Endo: Negative for unusual weight change.    Physical Examination:   There were no vitals taken for this visit.  General: Well-nourished, well-developed in no acute distress.  Eyes: No icterus. Mouth: Oropharyngeal mucosa moist and pink , no lesions erythema or exudate. Lungs: Clear to auscultation bilaterally.  Heart: Regular rate and rhythm, no murmurs rubs or gallops.  Abdomen: Bowel sounds are normal, nontender, nondistended, no hepatosplenomegaly or masses, no abdominal bruits or hernia , no rebound or guarding.   Extremities: No lower extremity edema. No clubbing or deformities. Neuro: Alert and oriented x 4   Skin: Warm and dry,  no jaundice.   Psych: Alert and cooperative, normal mood and affect.  Labs:  ***  Imaging Studies: ECHOCARDIOGRAM COMPLETE  Result Date: 01/17/2020    ECHOCARDIOGRAM REPORT   Patient Name:   [name] Date of Exam: 01/17/2020 Medical Rec #:  [id number]              Height:       66.0 in Accession #:    [id number]             Weight:       140.0 lb Date of Birth:  [birth year]              BSA:           1.719 m Patient Age:    89 years               BP:           155/73 mmHg Patient Gender: F                      HR:           67 bpm. Exam Location:  Outpatient Procedure: 2D Echo, Cardiac Doppler and Color Doppler Indications:    Congestive Heart Failure 428.0 / I50.9  History:        Patient has prior history of Echocardiogram examinations, most                 recent 10/18/2018. COPD and Pulmonary HTN; Risk                 Factors:Hypertension and Current Smoker. GERD.  Sonographer:    Vickie Epley RDCS Referring Phys: [place]  1. Left ventricular ejection fraction, by estimation, is 60 to 65%. The left ventricle has normal function. The left ventricle has no regional wall motion abnormalities. Left ventricular diastolic parameters are consistent with Grade I diastolic dysfunction (impaired relaxation).  2. Right ventricular systolic function is normal. The right ventricular size is normal. There is moderately elevated pulmonary artery systolic pressure. The estimated right ventricular systolic pressure is 18.5 mmHg.  3. The mitral valve is normal in structure. Trivial mitral valve regurgitation. No evidence of mitral stenosis.  4. The aortic valve is tricuspid. Aortic valve regurgitation is not visualized. No aortic stenosis is present.  5. The inferior vena cava is normal in size with greater than 50% respiratory variability, suggesting right atrial pressure of 3 mmHg. FINDINGS  Left Ventricle: Left ventricular ejection fraction, by estimation, is 60 to 65%. The left ventricle has normal function. The left ventricle has no regional wall motion abnormalities. The left ventricular internal cavity size was normal in size. There is  no left ventricular hypertrophy. Left ventricular diastolic parameters are consistent with Grade I diastolic dysfunction (impaired relaxation). Right Ventricle: The right ventricular size is normal. No increase in right ventricular wall thickness. Right  ventricular systolic function is normal. There is moderately elevated pulmonary artery systolic pressure. The tricuspid regurgitant velocity is 3.56 m/s, and with an assumed right atrial pressure of 3 mmHg, the estimated right ventricular systolic pressure is 63.1 mmHg. Left Atrium: Left atrial size was normal in size. Right Atrium: Right atrial size was normal in size. Pericardium: There is no evidence of pericardial effusion. Mitral Valve: The mitral valve is normal in structure. Trivial mitral valve regurgitation. No evidence of mitral valve stenosis. Tricuspid Valve: The tricuspid valve is normal in structure. Tricuspid valve regurgitation is  not demonstrated. Aortic Valve: The aortic valve is tricuspid. Aortic valve regurgitation is not visualized. No aortic stenosis is present. Pulmonic Valve: The pulmonic valve was normal in structure. Pulmonic valve regurgitation is not visualized. Aorta: The aortic root is normal in size and structure. Venous: The inferior vena cava is normal in size with greater than 50% respiratory variability, suggesting right atrial pressure of 3 mmHg. IAS/Shunts: No atrial level shunt detected by color flow Doppler.  LEFT VENTRICLE PLAX 2D LVIDd:         4.30 cm     Diastology LVIDs:         3.10 cm     LV e' medial:    6.20 cm/s LV PW:         0.80 cm     LV E/e' medial:  11.0 LV IVS:        0.80 cm     LV e' lateral:   7.18 cm/s LVOT diam:     2.00 cm     LV E/e' lateral: 9.5 LV SV:         78 LV SV Index:   46 LVOT Area:     3.14 cm  LV Volumes (MOD) LV vol d, MOD A2C: 64.8 ml LV vol d, MOD A4C: 61.8 ml LV vol s, MOD A2C: 26.1 ml LV vol s, MOD A4C: 21.5 ml LV SV MOD A2C:     38.7 ml LV SV MOD A4C:     61.8 ml LV SV MOD BP:      39.2 ml RIGHT VENTRICLE RV S prime:     10.30 cm/s TAPSE (M-mode): 1.9 cm LEFT ATRIUM             Index       RIGHT ATRIUM           Index LA diam:        3.40 cm 1.98 cm/m  RA Area:     11.00 cm LA Vol (A2C):   38.7 ml 22.52 ml/m RA Volume:   21.40 ml   12.45 ml/m LA Vol (A4C):   24.9 ml 14.49 ml/m LA Biplane Vol: 34.0 ml 19.78 ml/m  AORTIC VALVE LVOT Vmax:   117.00 cm/s LVOT Vmean:  73.800 cm/s LVOT VTI:    0.249 m  AORTA Ao Root diam: 2.80 cm MITRAL VALVE               TRICUSPID VALVE MV Area (PHT): 2.76 cm    TR Peak grad:   50.7 mmHg MV Decel Time: 275 msec    TR Vmax:        356.00 cm/s MV E velocity: 68.10 cm/s MV A velocity: 72.40 cm/s  SHUNTS MV E/A ratio:  0.94        Systemic VTI:  0.25 m                            Systemic Diam: 2.00 cm Loralie Champagne MD Electronically signed by Loralie Champagne MD Signature Date/Time: 01/17/2020/2:38:06 PM    Final

## 2020-03-18 ENCOUNTER — Other Ambulatory Visit: Payer: Self-pay

## 2020-03-18 ENCOUNTER — Ambulatory Visit (INDEPENDENT_AMBULATORY_CARE_PROVIDER_SITE_OTHER): Payer: Medicare Other | Admitting: Gastroenterology

## 2020-03-18 ENCOUNTER — Encounter: Payer: Self-pay | Admitting: Gastroenterology

## 2020-03-18 VITALS — BP 122/66 | HR 86 | Temp 96.9°F | Ht 65.0 in | Wt 165.6 lb

## 2020-03-18 DIAGNOSIS — R1319 Other dysphagia: Secondary | ICD-10-CM | POA: Diagnosis not present

## 2020-03-18 DIAGNOSIS — K3189 Other diseases of stomach and duodenum: Secondary | ICD-10-CM | POA: Diagnosis not present

## 2020-03-18 NOTE — Patient Instructions (Signed)
1. Continue pantoprazole 40 mg daily. 2. Return to the office in July/August of next year.  We will schedule for upper endoscopy at that time to follow-up on small bowel nodule. 3. Call with any questions or concerns.

## 2020-03-18 NOTE — Progress Notes (Signed)
Primary Care Physician: Lemmie Evens, MD  Primary Gastroenterologist:  Garfield Cornea, MD   Chief Complaint  Patient presents with  . pp f/u    Swallowing ok    HPI: [name] is a 74 y.o. female here for follow-up.  She completed an EGD and colonoscopy in March 2021.  She had a normal esophagus, dilated due to history of dysphagia.  Evidence of prior hemigastrectomy.  Small bowel nodule at the anastomosis, biopsied with mild reactive changes and focal intestinal metaplasia.  Repeat EGD recommended in 1 year.  On colonoscopy she had couple of tubular adenomas removed, noncompliant left colon, diverticulosis.  Clinically doing well.  Dysphagia improved.  Denies heartburn or abdominal pain.  Bowel movements are regular.  No blood in the stool or melena.  No nausea or vomiting.  Current Outpatient Medications  Medication Sig Dispense Refill  . amLODipine (NORVASC) 10 MG tablet Take 1 tablet (10 mg total) by mouth daily. 30 tablet 6  . clonazePAM (KLONOPIN) 1 MG tablet Take 1 mg by mouth 3 (three) times daily as needed for anxiety.     Marland Kitchen escitalopram (LEXAPRO) 10 MG tablet Take 10 mg by mouth daily.     . furosemide (LASIX) 40 MG tablet Take 1 tablet (40 mg total) by mouth daily. 30 tablet 5  . gabapentin (NEURONTIN) 300 MG capsule Take 300 mg by mouth 3 (three) times daily.    Marland Kitchen HYDROcodone-acetaminophen (NORCO) 10-325 MG tablet Take 1 tablet by mouth every 4 (four) hours as needed for moderate pain.     . pantoprazole (PROTONIX) 40 MG tablet Take 40 mg by mouth at bedtime.    . potassium chloride (KLOR-CON) 10 MEQ tablet TAKE 1 TABLET BY MOUTH TWICE DAILY. (Patient taking differently: Take 10 mEq by mouth 2 (two) times daily.) 60 tablet 5  . PROAIR HFA 108 (90 Base) MCG/ACT inhaler Inhale 2 puffs into the lungs every 4 (four) hours as needed for wheezing or shortness of breath.     . COMBIVENT RESPIMAT 20-100 MCG/ACT AERS respimat Inhale 2 puffs into the lungs 2  (two) times daily. (Patient not taking: No sig reported) 4 g 2  . OXYGEN Inhale 2 L into the lungs at bedtime. (Patient not taking: No sig reported)    . Treprostinil (TYVASO) 0.6 MG/ML SOLN Inhale 18 mcg into the lungs 4 (four) times daily. (Patient not taking: No sig reported) 87 mL 11   No current facility-administered medications for this visit.    Allergies as of 03/18/2020 - Review Complete 03/18/2020  Allergen Reaction Noted  . Chantix [varenicline] Nausea And Vomiting 12/01/2016  . Codeine Other (See Comments) 12/01/2016  . Oxycodone Palpitations 01/11/2017    ROS:  General: Negative for anorexia, weight loss, fever, chills, fatigue, weakness. ENT: Negative for hoarseness, difficulty swallowing , nasal congestion. CV: Negative for chest pain, angina, palpitations, dyspnea on exertion, peripheral edema.  Respiratory: Negative for dyspnea at rest, dyspnea on exertion, cough, sputum, wheezing.  GI: See history of present illness. GU:  Negative for dysuria, hematuria, urinary incontinence, urinary frequency, nocturnal urination.  Endo: Negative for unusual weight change.    Physical Examination:   BP 122/66   Pulse 86   Temp (!) 96.9 F (36.1 C) (Temporal)   Ht _0  (1.651 m)   Wt 165 lb 9.6 oz (75.1 kg)   BMI 27.56 kg/m   General: Well-nourished, well-developed in no acute distress.  Eyes: No icterus. Mouth: masked.  Extremities: No lower extremity edema. No clubbing or deformities. Neuro: Alert and oriented x 4   Skin: Warm and dry, no jaundice.   Psych: Alert and cooperative, normal mood and affect.  Impression:  74 year old female with history of chronic GERD/dysphagia. Typical reflux symptoms well controlled on pantoprazole.  She has a history of Billroth I configuration.  Recent EGD, esophagus dilated empirically.  She reports swallowing issues have resolved.  Biopsies were taken of an area of small bowel at the anastomosis which showed focal intestinal  metaplasia.  Plans for repeat EGD in 1 year.  Plan:  1. Continue pantoprazole 40 mg daily. 2. Return to the office July/August of next year and schedule surveillance EGD at that time. 3. Call with any questions or concerns.

## 2020-04-10 ENCOUNTER — Other Ambulatory Visit (HOSPITAL_COMMUNITY): Payer: Self-pay | Admitting: Cardiology

## 2020-05-07 ENCOUNTER — Other Ambulatory Visit: Payer: Self-pay | Admitting: Nurse Practitioner

## 2020-05-07 DIAGNOSIS — M5441 Lumbago with sciatica, right side: Secondary | ICD-10-CM

## 2020-05-07 DIAGNOSIS — G8929 Other chronic pain: Secondary | ICD-10-CM

## 2020-05-10 ENCOUNTER — Ambulatory Visit
Admission: RE | Admit: 2020-05-10 | Discharge: 2020-05-10 | Disposition: A | Discharge status: Home / Self Care | Payer: Medicare Other | Source: Ambulatory Visit | Attending: Nurse Practitioner | Admitting: Nurse Practitioner

## 2020-05-10 DIAGNOSIS — G8929 Other chronic pain: Secondary | ICD-10-CM

## 2020-05-10 DIAGNOSIS — M5441 Lumbago with sciatica, right side: Secondary | ICD-10-CM

## 2020-05-10 MED ORDER — IOPAMIDOL (ISOVUE-M 200) INJECTION 41%
1.0000 mL | Freq: Once | INTRAMUSCULAR | Status: AC
  Administered 2020-05-10: 1 mL via EPIDURAL

## 2020-05-10 MED ORDER — METHYLPREDNISOLONE ACETATE 40 MG/ML INJ SUSP (RADIOLOG
120.0000 mg | Freq: Once | INTRAMUSCULAR | Status: AC
  Administered 2020-05-10: 120 mg via EPIDURAL

## 2020-05-10 NOTE — Discharge Instructions (Signed)

## 2020-05-11 ENCOUNTER — Other Ambulatory Visit (HOSPITAL_COMMUNITY): Payer: Self-pay | Admitting: Cardiology

## 2020-05-21 ENCOUNTER — Ambulatory Visit (HOSPITAL_COMMUNITY)
Admission: RE | Admit: 2020-05-21 | Discharge: 2020-05-21 | Disposition: A | Discharge status: Home / Self Care | Payer: Medicare Other | Source: Ambulatory Visit | Attending: Cardiology | Admitting: Cardiology

## 2020-05-21 ENCOUNTER — Encounter (HOSPITAL_COMMUNITY): Payer: Self-pay | Admitting: Cardiology

## 2020-05-21 ENCOUNTER — Other Ambulatory Visit: Payer: Self-pay

## 2020-05-21 ENCOUNTER — Telehealth (HOSPITAL_COMMUNITY): Payer: Self-pay | Admitting: Pharmacy Technician

## 2020-05-21 VITALS — BP 118/60 | HR 83 | Wt 171.4 lb

## 2020-05-21 DIAGNOSIS — Z79899 Other long term (current) drug therapy: Secondary | ICD-10-CM | POA: Insufficient documentation

## 2020-05-21 DIAGNOSIS — Z7984 Long term (current) use of oral hypoglycemic drugs: Secondary | ICD-10-CM | POA: Diagnosis not present

## 2020-05-21 DIAGNOSIS — I272 Pulmonary hypertension, unspecified: Secondary | ICD-10-CM

## 2020-05-21 DIAGNOSIS — I50810 Right heart failure, unspecified: Secondary | ICD-10-CM | POA: Diagnosis not present

## 2020-05-21 DIAGNOSIS — I2723 Pulmonary hypertension due to lung diseases and hypoxia: Secondary | ICD-10-CM | POA: Insufficient documentation

## 2020-05-21 DIAGNOSIS — I5022 Chronic systolic (congestive) heart failure: Secondary | ICD-10-CM | POA: Diagnosis not present

## 2020-05-21 DIAGNOSIS — F17210 Nicotine dependence, cigarettes, uncomplicated: Secondary | ICD-10-CM | POA: Insufficient documentation

## 2020-05-21 DIAGNOSIS — I110 Hypertensive heart disease with heart failure: Secondary | ICD-10-CM | POA: Diagnosis not present

## 2020-05-21 DIAGNOSIS — J449 Chronic obstructive pulmonary disease, unspecified: Secondary | ICD-10-CM | POA: Insufficient documentation

## 2020-05-21 LAB — BASIC METABOLIC PANEL
Anion gap: 9 (ref 5–15)
BUN: 7 mg/dL — ABNORMAL LOW (ref 8–23)
CO2: 29 mmol/L (ref 22–32)
Calcium: 8.7 mg/dL — ABNORMAL LOW (ref 8.9–10.3)
Chloride: 98 mmol/L (ref 98–111)
Creatinine, Ser: 0.63 mg/dL (ref 0.44–1.00)
GFR, Estimated: >60 mL/min (ref >60)
Glucose, Bld: 94 mg/dL (ref 70–99)
Potassium: 3.4 mmol/L — ABNORMAL LOW (ref 3.5–5.1)
Sodium: 136 mmol/L (ref 135–145)

## 2020-05-21 LAB — BRAIN NATRIURETIC PEPTIDE: B Natriuretic Peptide: 119.2 pg/mL — ABNORMAL HIGH (ref 0.0–100.0)

## 2020-05-21 MED ORDER — DAPAGLIFLOZIN PROPANEDIOL 10 MG PO TABS: 10.0000 mg | ORAL_TABLET | Freq: Every day | ORAL | 11 refills | Status: DC

## 2020-05-21 NOTE — Telephone Encounter (Signed)
Patient was seen in clinic today and started on Farxiga. Her current 30 day co-pay is $53.10 and 90 day supply is $147. Patient was given a 30 day free card.   Started an application for [employer] assistance. Will fax once all signatures are obtained.

## 2020-05-21 NOTE — Patient Instructions (Addendum)
Labs done today. We will contact you only if your labs are abnormal.  START Farxiga 56m (1 tablet) by mouth daily.  No other medication changes were made. Please continue all current medications as prescribed.  Your physician recommends that you schedule a follow-up appointment in: 10 days for a lab only appointment and in 3-4 weeks with our APP Clinic  If you have any questions or concerns before your next appointment please send uKoreaa message through mDudleyor call our office at [phone]    TO LEAVE A MESSAGE FOR THE NURSE SELECT OPTION 2, PLEASE LEAVE A MESSAGE INCLUDING: . YOUR NAME . DATE OF BIRTH . CALL BACK NUMBER . REASON FOR CALL**this is important as we prioritize the call backs  YOU WILL RECEIVE A CALL BACK THE SAME DAY AS LONG AS YOU CALL BEFORE 4:00 PM   Do the following things EVERYDAY: 1) Weigh yourself in the morning before breakfast. Write it down and keep it in a log. 2) Take your medicines as prescribed 3) Eat low salt foods--Limit salt (sodium) to 2000 mg per day.  4) Stay as active as you can everyday 5) Limit all fluids for the day to less than 2 liters   At [facility] you and your health needs are our priority. As part of our continuing mission to provide you with exceptional heart care, we have created designated Provider Care Teams. These Care Teams include your primary Cardiologist (physician) and Advanced Practice Providers (APPs- Physician Assistants and Nurse Practitioners) who all work together to provide you with the care you need, when you need it.   You may see any of the following providers on your designated Care Team at your next follow up: .Marland KitchenDr DGlori Bickers. Dr DLoralie Champagne. ADarrick Grinder NP . BLyda Jester PA . LAudry Riles PharmD   Please be sure to bring in all your medications bottles to every appointment.

## 2020-05-21 NOTE — Progress Notes (Signed)
PCP: Dr. Karie Kirks Cardiology: Dr. Harrington Challenger HF Cardiology: Dr. Aundra Dubin  75 y.o. smoker with COPD and HTN presents for followup of pulmonary hypertension.  Echo in 1/20 showed severe RV dilation and dysfunction.  RHC in 1/20 showed moderate pulmonary hypertension with elevated PVR and low cardiac output.  PCWP was normal.  PFTs showed moderate-severe obstruction.  V/Q scan showed no chronic PE. PFTs showed moderate to severe obstruction/COPD.    She was started on Opsumit given concern for pulmonary hypertension out of proportion to lung disease.  She had to stop this due to side effects.  She started on tadalafil and did well on it with improved symptoms.   Echo in 7/20 showed EF 60-65% with moderate RV dilation and mildly decreased systolic function, D-shaped septum, mild-moderate TR, PASP down to 45 mmHg (76 mmHg on last echo).  She developed angioedema, hard to tell if it was from spironolactone or tadalafil, so she is off both medications.    In 2/21, she was seen in the ER with dyspnea and found to have possible RLL PNA.  She was treated with levofloxacin.   She did not tolerate Tyvaso and has stopped it.   Echo in 10/21 showed EF 60-65%, normal RV, PASP 54 mmHg, normal IVC.   She returns today for followup of CHF/pulmonary hypertension.  She is still smoking about 5 cigarettes/day. BP is controlled. She is not using oxygen.  She has been under stress, her husband is going for vascular surgery soon.  No chest pain.  No dyspnea on flat ground, she does get winded with stairs/hills. Weight is up 14 lbs.   6 minute walk (2/20): 298 m 6 minute walk (7/20): 152 m 6 minute walk (9/20): 304 m 6 minute walk (2/21): 249 m  Labs (1/20): K 4, creatinine 0.8, TSH normal, BNP 994 Labs (2/20): HIV negative, SCL-70 negative, centromere Ab negative, RF negative Labs (5/20): BNP 140, ANA negative, K 3.9, creatinine 0.74 Labs (7/20): K 4.1, creatinine 0.77 Labs (9/20): K 4.1, creatinine 0.79 Labs (2/21):  BNP 685, K 3.8, creatinine 0.46 Labs (4/21): K 4.4, creatinine 0.52 Labs (9/21): K 4.1, creatinine 0.66  PMH: 1. HTN 2. COPD: Active smoker.  - PFTs (2/20): FVC 51%, FEV1 57%, ratio 72%, TLC 93%, DLCO 51% => moderate-severe obstructive disease.  3. H/o cholecystectomy 4. OA 5. H/o back surgery 6. Pulmonary hypertension:  - Echo (1/20): EF 60-65%, mild LVH, severe RV dilation with severely decreased RV systolic function, PASP 76 mmHg.  - RHC (1/20): mean RA 10, PA 55/21 mean 36, mean PCWP 7, CI 1.6, PVR 10 WU.  - V/Q scan (2/20): Not suggestive of chronic PE.  - CTA chest (1/20): No PE, no evidence for ILD.  - PFTs (2/20): Moderate to severe obstructive lung disease. - Echo (7/20): EF 60-65%, with moderate RV dilation and mildly decreased systolic function, D-shaped septum, mild-moderate TR, PASP down to 45 mmHg (76 mmHg on last echo) - Unable to tolerate Opsumit.  - Possible angioedema with tadalafil.  - Unable to tolerate Tyvaso - Echo (10/21): EF 60-65%, normal RV, PASP 54 mmHg, normal IVC.   Family History  Problem Relation Age of Onset  . Cancer Sister        breast cancer  . Cancer Brother 61       esophageal cancer  . Cancer Maternal Aunt        pancreas?  . Cancer Paternal Aunt        multiple aunts had cancer  .  Colon cancer Neg Hx    Social History   Socioeconomic History  . Marital status: Married    Spouse name: Not on file  . Number of children: Not on file  . Years of education: Not on file  . Highest education level: Not on file  Occupational History  . Not on file  Tobacco Use  . Smoking status: Current Every Day Smoker    Packs/day: 0.30    Years: 15.00    Pack years: 4.50    Types: Cigarettes  . Smokeless tobacco: Never Used  Vaping Use  . Vaping Use: Never used  Substance and Sexual Activity  . Alcohol use: No  . Drug use: No  . Sexual activity: Not on file  Other Topics Concern  . Not on file  Social History Narrative  . Not on file    Social Determinants of Health   Financial Resource Strain: Not on file  Food Insecurity: Not on file  Transportation Needs: Not on file  Physical Activity: Not on file  Stress: Not on file  Social Connections: Not on file  Intimate Partner Violence: Not on file   ROS: All systems reviewed and negative except as per HPI.   Current Outpatient Medications  Medication Sig Dispense Refill  . amLODipine (NORVASC) 10 MG tablet Take 1 tablet (10 mg total) by mouth daily. 30 tablet 6  . clonazePAM (KLONOPIN) 1 MG tablet Take 1 mg by mouth 3 (three) times daily as needed for anxiety.     . dapagliflozin propanediol (FARXIGA) 10 MG TABS tablet Take 1 tablet (10 mg total) by mouth daily before breakfast. 30 tablet 11  . escitalopram (LEXAPRO) 10 MG tablet Take 10 mg by mouth daily.     . furosemide (LASIX) 40 MG tablet TAKE 1 TABLET BY MOUTH DAILY 30 tablet 0  . gabapentin (NEURONTIN) 300 MG capsule Take 300 mg by mouth 3 (three) times daily.    Marland Kitchen HYDROcodone-acetaminophen (NORCO) 10-325 MG tablet Take 1 tablet by mouth every 4 (four) hours as needed for moderate pain.     . pantoprazole (PROTONIX) 40 MG tablet Take 40 mg by mouth at bedtime.    . potassium chloride (KLOR-CON) 10 MEQ tablet TAKE 1 TABLET BY MOUTH TWICE DAILY. 60 tablet 5  . PROAIR HFA 108 (90 Base) MCG/ACT inhaler Inhale 2 puffs into the lungs every 4 (four) hours as needed for wheezing or shortness of breath.      No current facility-administered medications for this encounter.   BP 118/60   Pulse 83   Wt 77.7 kg (171 lb 6.4 oz)   SpO2 91%   BMI 28.52 kg/m  General: NAD Neck: JVP 8-9 cm, no thyromegaly or thyroid nodule.  Lungs: Clear to auscultation bilaterally with normal respiratory effort. CV: Nondisplaced PMI.  Heart regular S1/S2, no S3/S4, no murmur.  1+ ankle edema.  No carotid bruit.  Normal pedal pulses.  Abdomen: Soft, nontender, no hepatosplenomegaly, no distention.  Skin: Intact without lesions or rashes.   Neurologic: Alert and oriented x 3.  Psych: Normal affect. Extremities: No clubbing or cyanosis.  HEENT: Normal.   Assessment/Plan: 1. Pulmonary hypertension: Echo 1/20 with severe RV dilation/dysfunction.  RHC with moderate PH, high PVR, normal PCWP, low cardiac output.  No evidence for chronic PE by V/Q scan.  PFTs show a moderate to severe obstructive defect. Rheumatological serologies negative. She still smokes.  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  Unable to tolerate Opsumit. Her breathing was better on tadalafil, but she is now off this medication due to concern that it caused angioedema.  She did not tolerate Tyvaso. Echo in 10/21 showed normal RV function, PASP 54 mmHg.  -  She has not tolerated multiple PH medications, will keep her off for the time being as she is doing reasonably well.  2. RV failure: NYHA class II but she looks volume overloaded today with weight gain.  - Continue Lasix 40 mg daily, BMET today.   - She is off spironolactone with possible angioedema.  - Add Jardiance 10 mg daily as this should help with mild volume overload.  BMET/BNP today, BMET in 10 days.  - If volume overload persists despite addition of Jardiance, will need to increase Lasix.    3. COPD/smoking: She continues to smoke.  She did not tolerate Chantix and Wellbutrin did not work.  She is not using oxygen.   - Encouraged her to keep work on quitting.  4. HTN: BP controlled.   Followup 3-4 weeks with APP to reassess volume, ?need to increase Lasix.   Loralie Champagne 05/21/2020

## 2020-06-03 ENCOUNTER — Telehealth (HOSPITAL_COMMUNITY): Payer: Self-pay | Admitting: Family Medicine

## 2020-06-03 ENCOUNTER — Other Ambulatory Visit (HOSPITAL_COMMUNITY): Payer: Self-pay | Admitting: *Deleted

## 2020-06-03 DIAGNOSIS — I5022 Chronic systolic (congestive) heart failure: Secondary | ICD-10-CM

## 2020-06-03 NOTE — Telephone Encounter (Signed)
Pt lost lab orders, please fax to [facility], pt can be reached _[phone]. Thanks

## 2020-06-03 NOTE — Telephone Encounter (Signed)
Called [employer] to check the status of the patient's application. Representative stated that they do not have a determination at this time. She requested the patient's Medicare A/B ID. The patient has an advantage plan, so it is all rolled into one. Given that information, the representative stated that they will finish processing the application today, along with a determination.

## 2020-06-04 ENCOUNTER — Other Ambulatory Visit: Payer: Self-pay

## 2020-06-04 ENCOUNTER — Other Ambulatory Visit (HOSPITAL_COMMUNITY)
Admission: RE | Admit: 2020-06-04 | Discharge: 2020-06-04 | Disposition: A | Discharge status: Home / Self Care | Payer: Medicare Other | Source: Ambulatory Visit | Attending: Cardiology | Admitting: Cardiology

## 2020-06-04 DIAGNOSIS — I5022 Chronic systolic (congestive) heart failure: Secondary | ICD-10-CM | POA: Insufficient documentation

## 2020-06-04 LAB — BASIC METABOLIC PANEL
Anion gap: 10 (ref 5–15)
BUN: 7 mg/dL — ABNORMAL LOW (ref 8–23)
CO2: 26 mmol/L (ref 22–32)
Calcium: 8.9 mg/dL (ref 8.9–10.3)
Chloride: 98 mmol/L (ref 98–111)
Creatinine, Ser: 0.75 mg/dL (ref 0.44–1.00)
GFR, Estimated: >60 mL/min (ref >60)
Glucose, Bld: 94 mg/dL (ref 70–99)
Potassium: 4.3 mmol/L (ref 3.5–5.1)
Sodium: 134 mmol/L — ABNORMAL LOW (ref 135–145)

## 2020-06-10 ENCOUNTER — Telehealth (HOSPITAL_COMMUNITY): Payer: Self-pay | Admitting: Pharmacy Technician

## 2020-06-10 NOTE — Telephone Encounter (Addendum)
Called [employer] to check the status of the patient's application. Representative stated that they would need the patient's social security number in place of her Medicare A/B number (since she has an advantage plan) to move forward with processing the application.   Advanced Heart Failure Patient Advocate Encounter   Patient was approved to receive Farxiga from [employer]  Patient ID: [id number] Effective dates: 06/10/20 through 04/05/21  They will reach out to the patient to schedule the first shipment and then it will auto ship thereafter. Called and informed her of the approval.  Charlann Boxer, CPhT

## 2020-06-11 ENCOUNTER — Other Ambulatory Visit (HOSPITAL_COMMUNITY): Payer: Self-pay | Admitting: Cardiology

## 2020-06-17 NOTE — Progress Notes (Signed)
PCP: Dr. Karie Kirks Cardiology: Dr. Harrington Challenger HF Cardiology: Dr. 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 smoker with COPD and HTN presents for followup of pulmonary hypertension.  Echo in 1/20 showed severe RV dilation and dysfunction.  RHC in 1/20 showed moderate pulmonary hypertension with elevated PVR and low cardiac output.  PCWP was normal.  PFTs showed moderate-severe obstruction.  V/Q scan showed no chronic PE. PFTs showed moderate to severe obstruction/COPD.    She was started on Opsumit given concern for pulmonary hypertension out of proportion to lung disease.  She had to stop this due to side effects.  She started on tadalafil and did well on it with improved symptoms.   Echo in 7/20 showed EF 60-65% with moderate RV dilation and mildly decreased systolic function, D-shaped septum, mild-moderate TR, PASP down to 45 mmHg (76 mmHg on last echo).  She developed angioedema, hard to tell if it was from spironolactone or tadalafil, so she is off both medications.    In 2/21, she was seen in the ER with dyspnea and found to have possible RLL PNA.  She was treated with levofloxacin.   She did not tolerate Tyvaso and has stopped it.   Echo in 10/21 showed EF 60-65%, normal RV, PASP 54 mmHg, normal IVC.   Recently seen in clinic 2/22 and was fluid overloaded. Wt was up 14 lb. Farxiga 10 mg was added to regimen w/ recommendations to further increase Lasix, as next step, if fluid overload were to persist.   She returns back to clinic today for f/u. Her wt is down 11 lb from 171>>160 lb. Overall feels better. No LEE. No significant dyspnea walking on flat surfaces. BP mildly elevated but has not yet taken AM meds yet. No GU symptoms since starting Farxiga. She reports some mild occasional dizziness but no syncope/ near syncope. She checks her BP regularly at home and no hypotension.   6 minute walk (2/20): 298 m 6 minute walk (7/20): 152 m 6 minute  walk (9/20): 304 m 6 minute walk (2/21): 249 m  Labs (1/20): K 4, creatinine 0.8, TSH normal, BNP 994 Labs (2/20): HIV negative, SCL-70 negative, centromere Ab negative, RF negative Labs (5/20): BNP 140, ANA negative, K 3.9, creatinine 0.74 Labs (7/20): K 4.1, creatinine 0.77 Labs (9/20): K 4.1, creatinine 0.79 Labs (2/21): BNP 685, K 3.8, creatinine 0.46 Labs (4/21): K 4.4, creatinine 0.52 Labs (9/21): K 4.1, creatinine 0.66 Labs (2/22): K 3.4, creatinine 0.63 Labs (3/22): K 4.3, creatinine 0.75   PMH: 1. HTN 2. COPD: Active smoker.  - PFTs (2/20): FVC 51%, FEV1 57%, ratio 72%, TLC 93%, DLCO 51% => moderate-severe obstructive disease.  3. H/o cholecystectomy 4. OA 5. H/o back surgery 6. Pulmonary hypertension:  - Echo (1/20): EF 60-65%, mild LVH, severe RV dilation with severely decreased RV systolic function, PASP 76 mmHg.  - RHC (1/20): mean RA 10, PA 55/21 mean 36, mean PCWP 7, CI 1.6, PVR 10 WU.  - V/Q scan (2/20): Not suggestive of chronic PE.  - CTA chest (1/20): No PE, no evidence for ILD.  - PFTs (2/20): Moderate to severe obstructive lung disease. - Echo (7/20): EF 60-65%, with moderate RV dilation and mildly decreased systolic function, D-shaped septum, mild-moderate TR, PASP down to 45 mmHg (76 mmHg on last echo) - Unable to tolerate Opsumit.  - Possible angioedema with tadalafil.  - Unable to tolerate Tyvaso - Echo (10/21): EF 60-65%, normal  RV, PASP 54 mmHg, normal IVC.   Family History  Problem Relation Age of Onset  . Cancer Sister        breast cancer  . Cancer Brother 54       esophageal cancer  . Cancer Maternal Aunt        pancreas?  . Cancer Paternal Aunt        multiple aunts had cancer  . Colon cancer Neg Hx    Social History   Socioeconomic History  . Marital status: Married    Spouse name: Not on file  . Number of children: Not on file  . Years of education: Not on file  . Highest education level: Not on file  Occupational History  . Not  on file  Tobacco Use  . Smoking status: Current Every Day Smoker    Packs/day: 0.30    Years: 15.00    Pack years: 4.50    Types: Cigarettes  . Smokeless tobacco: Never Used  Vaping Use  . Vaping Use: Never used  Substance and Sexual Activity  . Alcohol use: No  . Drug use: No  . Sexual activity: Not on file  Other Topics Concern  . Not on file  Social History Narrative  . Not on file   Social Determinants of Health   Financial Resource Strain: Not on file  Food Insecurity: Not on file  Transportation Needs: Not on file  Physical Activity: Not on file  Stress: Not on file  Social Connections: Not on file  Intimate Partner Violence: Not on file   ROS: All systems reviewed and negative except as per HPI.   Current Outpatient Medications  Medication Sig Dispense Refill  . amLODipine (NORVASC) 10 MG tablet Take 1 tablet (10 mg total) by mouth daily. 30 tablet 6  . clonazePAM (KLONOPIN) 1 MG tablet Take 1 mg by mouth 3 (three) times daily as needed for anxiety.     . dapagliflozin propanediol (FARXIGA) 10 MG TABS tablet Take 1 tablet (10 mg total) by mouth daily before breakfast. 30 tablet 11  . escitalopram (LEXAPRO) 10 MG tablet Take 10 mg by mouth daily.     . furosemide (LASIX) 40 MG tablet TAKE 1 TABLET BY MOUTH DAILY 30 tablet 3  . gabapentin (NEURONTIN) 300 MG capsule Take 300 mg by mouth 3 (three) times daily.    Marland Kitchen HYDROcodone-acetaminophen (NORCO) 10-325 MG tablet Take 1 tablet by mouth every 4 (four) hours as needed for moderate pain.     . pantoprazole (PROTONIX) 40 MG tablet Take 40 mg by mouth at bedtime.    . potassium chloride (KLOR-CON) 10 MEQ tablet TAKE 1 TABLET BY MOUTH TWICE DAILY. 60 tablet 5  . PROAIR HFA 108 (90 Base) MCG/ACT inhaler Inhale 2 puffs into the lungs every 4 (four) hours as needed for wheezing or shortness of breath.      No current facility-administered medications for this encounter.   BP (!) 144/80   Pulse 95   Wt 72.9 kg (160 lb 12.8  oz)   SpO2 90% Comment: low initially 80 then up to 90%  BMI 26.76 kg/m  PHYSICAL EXAM: General:  Well appearing. No respiratory difficulty HEENT: normal Neck: supple. no JVD. Carotids 2+ bilat; no bruits. No lymphadenopathy or thyromegaly appreciated. Cor: PMI nondisplaced. Regular rate & rhythm. No rubs, gallops or murmurs. Lungs: clear Abdomen: soft, nontender, nondistended. No hepatosplenomegaly. No bruits or masses. Good bowel sounds. Extremities: no cyanosis, clubbing, rash, edema Neuro: alert &  oriented x 3, cranial nerves grossly intact. moves all 4 extremities w/o difficulty. Affect pleasant.   Assessment/Plan: 1. Pulmonary hypertension: Echo 1/20 with severe RV dilation/dysfunction.  RHC with moderate PH, high PVR, normal PCWP, low cardiac output.  No evidence for chronic PE by V/Q scan.  PFTs show a moderate to severe obstructive defect. Rheumatological serologies negative. She still smokes.  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  Unable to tolerate Opsumit. Her breathing was better on tadalafil, but she is now off this medication due to concern that it caused angioedema.  She did not tolerate Tyvaso. Echo in 10/21 showed normal RV function, PASP 54 mmHg.  -  She has not tolerated multiple PH medications, will keep her off for the time being as she is doing reasonably well.  2. RV failure: NYHA Class II-III, Volume status improved, Wt down 11 lb w/ [place]ade. Euvolemic on exam. - Continue Lasix 40 mg daily - She is off spironolactone with possible angioedema.  - Continue  Farxiga 10 mg daily - Advised to start checking wt daily and to notify office if > 3 lb in 24 hr or > 5 lb in a week.  - if development of recurrent fluid overload, next step would be to further increase lasix.  - Check BMP and BNP today  3. COPD/smoking: She continues to smoke.  She did not  tolerate Chantix and Wellbutrin did not work.  She is not using oxygen.   - smoking cessation advised  4. HTN: controlled on current regimen  F/u in w/ Dr. Aundra Dubin in 2-3 months.   Lyda Jester, PA-C  06/18/2020

## 2020-06-18 ENCOUNTER — Encounter (HOSPITAL_COMMUNITY): Payer: Self-pay

## 2020-06-18 ENCOUNTER — Ambulatory Visit (HOSPITAL_COMMUNITY)
Admission: RE | Admit: 2020-06-18 | Discharge: 2020-06-18 | Disposition: A | Discharge status: Home / Self Care | Payer: Medicare Other | Source: Ambulatory Visit | Attending: Cardiology | Admitting: Cardiology

## 2020-06-18 ENCOUNTER — Other Ambulatory Visit: Payer: Self-pay

## 2020-06-18 VITALS — BP 144/80 | HR 95 | Wt 160.8 lb

## 2020-06-18 DIAGNOSIS — T50905D Adverse effect of unspecified drugs, medicaments and biological substances, subsequent encounter: Secondary | ICD-10-CM | POA: Diagnosis not present

## 2020-06-18 DIAGNOSIS — I50810 Right heart failure, unspecified: Secondary | ICD-10-CM

## 2020-06-18 DIAGNOSIS — I5032 Chronic diastolic (congestive) heart failure: Secondary | ICD-10-CM

## 2020-06-18 DIAGNOSIS — T783XXD Angioneurotic edema, subsequent encounter: Secondary | ICD-10-CM | POA: Diagnosis not present

## 2020-06-18 DIAGNOSIS — I110 Hypertensive heart disease with heart failure: Secondary | ICD-10-CM | POA: Diagnosis not present

## 2020-06-18 DIAGNOSIS — Z7984 Long term (current) use of oral hypoglycemic drugs: Secondary | ICD-10-CM | POA: Insufficient documentation

## 2020-06-18 DIAGNOSIS — I2723 Pulmonary hypertension due to lung diseases and hypoxia: Secondary | ICD-10-CM | POA: Insufficient documentation

## 2020-06-18 DIAGNOSIS — I272 Pulmonary hypertension, unspecified: Secondary | ICD-10-CM | POA: Diagnosis not present

## 2020-06-18 DIAGNOSIS — F17210 Nicotine dependence, cigarettes, uncomplicated: Secondary | ICD-10-CM | POA: Diagnosis not present

## 2020-06-18 DIAGNOSIS — Z79899 Other long term (current) drug therapy: Secondary | ICD-10-CM | POA: Insufficient documentation

## 2020-06-18 DIAGNOSIS — J449 Chronic obstructive pulmonary disease, unspecified: Secondary | ICD-10-CM | POA: Insufficient documentation

## 2020-06-18 LAB — BASIC METABOLIC PANEL
Anion gap: 7 (ref 5–15)
BUN: 9 mg/dL (ref 8–23)
CO2: 29 mmol/L (ref 22–32)
Calcium: 9.1 mg/dL (ref 8.9–10.3)
Chloride: 98 mmol/L (ref 98–111)
Creatinine, Ser: 0.71 mg/dL (ref 0.44–1.00)
GFR, Estimated: >60 mL/min (ref >60)
Glucose, Bld: 94 mg/dL (ref 70–99)
Potassium: 4.1 mmol/L (ref 3.5–5.1)
Sodium: 134 mmol/L — ABNORMAL LOW (ref 135–145)

## 2020-06-18 LAB — BRAIN NATRIURETIC PEPTIDE: B Natriuretic Peptide: 41.1 pg/mL (ref 0.0–100.0)

## 2020-06-18 NOTE — Patient Instructions (Signed)
Labs done today. We will contact you only if your labs are abnormal.  No medication changes were made. Please continue all current medications as prescribed.  Your physician recommends that you schedule a follow-up appointment in: 2-3 months with Dr. Aundra Dubin   If you have any questions or concerns before your next appointment please send [place] a message through [facility] or call our office at [phone].    TO LEAVE A MESSAGE FOR THE NURSE SELECT OPTION 2, PLEASE LEAVE A MESSAGE INCLUDING: . YOUR NAME . DATE OF BIRTH . CALL BACK NUMBER . REASON FOR CALL**this is important as we prioritize the call backs  YOU WILL RECEIVE A CALL BACK THE SAME DAY AS LONG AS YOU CALL BEFORE 4:00 PM   Do the following things EVERYDAY: 1) Weigh yourself in the morning before breakfast. Write it down and keep it in a log. 2) Take your medicines as prescribed 3) Eat low salt foods-Limit salt (sodium) to 2000 mg per day.  4) Stay as active as you can everyday 5) Limit all fluids for the day to less than 2 liters   At the [facility], you and your health needs are our priority. As part of our continuing mission to provide you with exceptional heart care, we have created designated Provider Care Teams. These Care Teams include your primary Cardiologist (physician) and Advanced Practice Providers (APPs- Physician Assistants and Nurse Practitioners) who all work together to provide you with the care you need, when you need it.   You may see any of the following providers on your designated Care Team at your next follow up: Marland Kitchen Dr Glori Bickers . Dr Loralie Champagne . Darrick Grinder, NP . Lyda Jester, PA . Audry Riles, PharmD   Please be sure to bring in all your medications bottles to every appointment.

## 2020-07-15 ENCOUNTER — Other Ambulatory Visit (HOSPITAL_COMMUNITY): Payer: Self-pay | Admitting: Cardiology

## 2020-08-08 ENCOUNTER — Encounter (HOSPITAL_COMMUNITY): Payer: Self-pay | Admitting: Cardiology

## 2020-08-08 ENCOUNTER — Other Ambulatory Visit: Payer: Self-pay

## 2020-08-08 ENCOUNTER — Ambulatory Visit (HOSPITAL_COMMUNITY)
Admission: RE | Admit: 2020-08-08 | Discharge: 2020-08-08 | Disposition: A | Discharge status: Home / Self Care | Payer: Medicare Other | Source: Ambulatory Visit | Attending: Cardiology | Admitting: Cardiology

## 2020-08-08 VITALS — BP 140/70 | HR 74 | Wt 160.2 lb

## 2020-08-08 DIAGNOSIS — I272 Pulmonary hypertension, unspecified: Secondary | ICD-10-CM

## 2020-08-08 DIAGNOSIS — I110 Hypertensive heart disease with heart failure: Secondary | ICD-10-CM | POA: Insufficient documentation

## 2020-08-08 DIAGNOSIS — Z7984 Long term (current) use of oral hypoglycemic drugs: Secondary | ICD-10-CM | POA: Insufficient documentation

## 2020-08-08 DIAGNOSIS — F17210 Nicotine dependence, cigarettes, uncomplicated: Secondary | ICD-10-CM | POA: Diagnosis not present

## 2020-08-08 DIAGNOSIS — I509 Heart failure, unspecified: Secondary | ICD-10-CM | POA: Diagnosis not present

## 2020-08-08 DIAGNOSIS — I2723 Pulmonary hypertension due to lung diseases and hypoxia: Secondary | ICD-10-CM | POA: Insufficient documentation

## 2020-08-08 DIAGNOSIS — I5032 Chronic diastolic (congestive) heart failure: Secondary | ICD-10-CM

## 2020-08-08 DIAGNOSIS — Z79899 Other long term (current) drug therapy: Secondary | ICD-10-CM | POA: Insufficient documentation

## 2020-08-08 DIAGNOSIS — J449 Chronic obstructive pulmonary disease, unspecified: Secondary | ICD-10-CM | POA: Insufficient documentation

## 2020-08-08 HISTORY — DX: Heart failure, unspecified: I50.9

## 2020-08-08 LAB — BASIC METABOLIC PANEL
Anion gap: 6 (ref 5–15)
BUN: 12 mg/dL (ref 8–23)
CO2: 33 mmol/L — ABNORMAL HIGH (ref 22–32)
Calcium: 8.8 mg/dL — ABNORMAL LOW (ref 8.9–10.3)
Chloride: 96 mmol/L — ABNORMAL LOW (ref 98–111)
Creatinine, Ser: 0.73 mg/dL (ref 0.44–1.00)
GFR, Estimated: >60 mL/min (ref >60)
Glucose, Bld: 94 mg/dL (ref 70–99)
Potassium: 4.5 mmol/L (ref 3.5–5.1)
Sodium: 135 mmol/L (ref 135–145)

## 2020-08-08 MED ORDER — DAPAGLIFLOZIN PROPANEDIOL 10 MG PO TABS: 10.0000 mg | ORAL_TABLET | Freq: Every day | ORAL | 11 refills | Status: DC

## 2020-08-08 MED ORDER — FUROSEMIDE 40 MG PO TABS
40.0000 mg | ORAL_TABLET | Freq: Every day | ORAL | 11 refills | Status: DC
Start: 2020-08-08 — End: 2021-06-07

## 2020-08-08 MED ORDER — POTASSIUM CHLORIDE ER 10 MEQ PO TBCR: EXTENDED_RELEASE_TABLET | ORAL | 11 refills | Status: DC

## 2020-08-08 MED ORDER — AMLODIPINE BESYLATE 10 MG PO TABS
10.0000 mg | ORAL_TABLET | Freq: Every day | ORAL | 11 refills | Status: DC
Start: 2020-08-08 — End: 2021-10-13

## 2020-08-08 NOTE — Progress Notes (Signed)
PCP: Dr. Karie Kirks Cardiology: Dr. Harrington Challenger HF Cardiology: Dr. Aundra Dubin  75 y.o. smoker with COPD and HTN presents for followup of pulmonary hypertension.  Echo in 1/20 showed severe RV dilation and dysfunction.  RHC in 1/20 showed moderate pulmonary hypertension with elevated PVR and low cardiac output.  PCWP was normal.  PFTs showed moderate-severe obstruction.  V/Q scan showed no chronic PE. PFTs showed moderate to severe obstruction/COPD.    She was started on Opsumit given concern for pulmonary hypertension out of proportion to lung disease.  She had to stop this due to side effects.  She started on tadalafil and did well on it with improved symptoms.   Echo in 7/20 showed EF 60-65% with moderate RV dilation and mildly decreased systolic function, D-shaped septum, mild-moderate TR, PASP down to 45 mmHg (76 mmHg on last echo).  She developed angioedema, hard to tell if it was from spironolactone or tadalafil, so she is off both medications.    In 2/21, she was seen in the ER with dyspnea and found to have possible RLL PNA.  She was treated with levofloxacin.   She did not tolerate Tyvaso and has stopped it.   Echo in 10/21 showed EF 60-65%, normal RV, PASP 54 mmHg, normal IVC.   She returns today for followup of CHF/pulmonary hypertension.  She is still smoking about 5 cigarettes/day. BP is generally controlled. She is not using oxygen.  Main complaint is arthritis pain in her hip.  No claudication.  No dyspnea with her usual ADLs.  No chest pain.  No lightheadedness/syncope.   6 minute walk (2/20): 298 m 6 minute walk (7/20): 152 m 6 minute walk (9/20): 304 m 6 minute walk (2/21): 249 m  Labs (1/20): K 4, creatinine 0.8, TSH normal, BNP 994 Labs (2/20): HIV negative, SCL-70 negative, centromere Ab negative, RF negative Labs (5/20): BNP 140, ANA negative, K 3.9, creatinine 0.74 Labs (7/20): K 4.1, creatinine 0.77 Labs (9/20): K 4.1, creatinine 0.79 Labs (2/21): BNP 685, K 3.8, creatinine  0.46 Labs (4/21): K 4.4, creatinine 0.52 Labs (9/21): K 4.1, creatinine 0.66 Labs (3/22): BNP 41, K 4.1, creatinine 0.71  PMH: 1. HTN 2. COPD: Active smoker.  - PFTs (2/20): FVC 51%, FEV1 57%, ratio 72%, TLC 93%, DLCO 51% => moderate-severe obstructive disease.  3. H/o cholecystectomy 4. OA 5. H/o back surgery 6. Pulmonary hypertension:  - Echo (1/20): EF 60-65%, mild LVH, severe RV dilation with severely decreased RV systolic function, PASP 76 mmHg.  - RHC (1/20): mean RA 10, PA 55/21 mean 36, mean PCWP 7, CI 1.6, PVR 10 WU.  - V/Q scan (2/20): Not suggestive of chronic PE.  - CTA chest (1/20): No PE, no evidence for ILD.  - PFTs (2/20): Moderate to severe obstructive lung disease. - Echo (7/20): EF 60-65%, with moderate RV dilation and mildly decreased systolic function, D-shaped septum, mild-moderate TR, PASP down to 45 mmHg (76 mmHg on last echo) - Unable to tolerate Opsumit.  - Possible angioedema with tadalafil.  - Unable to tolerate Tyvaso - Echo (10/21): EF 60-65%, normal RV, PASP 54 mmHg, normal IVC.   Family History  Problem Relation Age of Onset  . Cancer Sister        breast cancer  . Cancer Brother 81       esophageal cancer  . Cancer Maternal Aunt        pancreas?  . Cancer Paternal Aunt        multiple aunts had cancer  .  Colon cancer Neg Hx    Social History   Socioeconomic History  . Marital status: Married    Spouse name: Not on file  . Number of children: Not on file  . Years of education: Not on file  . Highest education level: Not on file  Occupational History  . Not on file  Tobacco Use  . Smoking status: Current Every Day Smoker    Packs/day: 0.30    Years: 15.00    Pack years: 4.50    Types: Cigarettes  . Smokeless tobacco: Never Used  Vaping Use  . Vaping Use: Never used  Substance and Sexual Activity  . Alcohol use: No  . Drug use: No  . Sexual activity: Not on file  Other Topics Concern  . Not on file  Social History Narrative   . Not on file   Social Determinants of Health   Financial Resource Strain: Not on file  Food Insecurity: Not on file  Transportation Needs: Not on file  Physical Activity: Not on file  Stress: Not on file  Social Connections: Not on file  Intimate Partner Violence: Not on file   ROS: All systems reviewed and negative except as per HPI.   Current Outpatient Medications  Medication Sig Dispense Refill  . clonazePAM (KLONOPIN) 1 MG tablet Take 1 mg by mouth 3 (three) times daily as needed for anxiety.     Marland Kitchen escitalopram (LEXAPRO) 10 MG tablet Take 10 mg by mouth daily.     Marland Kitchen gabapentin (NEURONTIN) 300 MG capsule Take 300 mg by mouth 3 (three) times daily.    Marland Kitchen HYDROcodone-acetaminophen (NORCO) 10-325 MG tablet Take 1 tablet by mouth every 4 (four) hours as needed for moderate pain.     . pantoprazole (PROTONIX) 40 MG tablet Take 40 mg by mouth at bedtime.    Marland Kitchen PROAIR HFA 108 (90 Base) MCG/ACT inhaler Inhale 2 puffs into the lungs every 4 (four) hours as needed for wheezing or shortness of breath.     Marland Kitchen amLODipine (NORVASC) 10 MG tablet Take 1 tablet (10 mg total) by mouth daily. 30 tablet 11  . dapagliflozin propanediol (FARXIGA) 10 MG TABS tablet Take 1 tablet (10 mg total) by mouth daily before breakfast. 30 tablet 11  . furosemide (LASIX) 40 MG tablet Take 1 tablet (40 mg total) by mouth daily. 30 tablet 11  . potassium chloride (KLOR-CON) 10 MEQ tablet TAKE (1) TABLET BY MOUTH TWICE DAILY. 60 tablet 11   No current facility-administered medications for this encounter.   BP 140/70   Pulse 74   Wt 72.7 kg (160 lb 3.2 oz)   SpO2 90%   BMI 26.66 kg/m  General: NAD Neck: No JVD, no thyromegaly or thyroid nodule.  Lungs: Clear to auscultation bilaterally with normal respiratory effort. CV: Nondisplaced PMI.  Heart regular S1/S2, no S3/S4, no murmur.  No peripheral edema.  No carotid bruit.  Normal pedal pulses.  Abdomen: Soft, nontender, no hepatosplenomegaly, no distention.   Skin: Intact without lesions or rashes.  Neurologic: Alert and oriented x 3.  Psych: Normal affect. Extremities: No clubbing or cyanosis.  HEENT: Normal.   Assessment/Plan: 1. Pulmonary hypertension: Echo 1/20 with severe RV dilation/dysfunction.  RHC with moderate PH, high PVR, normal PCWP, low cardiac output.  No evidence for chronic PE by V/Q scan.  PFTs show a moderate to severe obstructive defect. Rheumatological serologies negative. She still smokes.  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  Unable to tolerate Opsumit. Her breathing was better on tadalafil, but she is now off this medication due to concern that it caused angioedema.  She did not tolerate Tyvaso. Echo in 10/21 showed normal RV function, PASP 54 mmHg.  -  She has not tolerated multiple PH medications, will keep her off for the time being as she is doing reasonably well.  2. RV failure: NYHA class II, she does not look volume overloaded.  - Continue Lasix 40 mg daily, BMET today.   - She is off spironolactone with possible angioedema.  - Continue dapagliflozin.  3. COPD/smoking: She continues to smoke.  She did not tolerate Chantix and Wellbutrin did not work.  She is not using oxygen.   - Encouraged her to keep work on quitting.  4. HTN: BP controlled.   Followup in 4 months with [facility].   Loralie Champagne 08/08/2020

## 2020-08-08 NOTE — Patient Instructions (Signed)
Labs done today. We will contact you only if your labs are abnormal.  All your medications have been refilled.   No medication changes were made. Please continue all current medications as prescribed.  Your physician recommends that you schedule a follow-up appointment in: 4 months with our [facility] here in our office.  If you have any questions or concerns before your next appointment please send [place] a message through [facility] or call our office at [phone].    TO LEAVE A MESSAGE FOR THE NURSE SELECT OPTION 2, PLEASE LEAVE A MESSAGE INCLUDING: . YOUR NAME . DATE OF BIRTH . CALL BACK NUMBER . REASON FOR CALL**this is important as we prioritize the call backs  YOU WILL RECEIVE A CALL BACK THE SAME DAY AS LONG AS YOU CALL BEFORE 4:00 PM   Do the following things EVERYDAY: 1) Weigh yourself in the morning before breakfast. Write it down and keep it in a log. 2) Take your medicines as prescribed 3) Eat low salt foods--Limit salt (sodium) to 2000 mg per day.  4) Stay as active as you can everyday 5) Limit all fluids for the day to less than 2 liters   At the [facility], you and your health needs are our priority. As part of our continuing mission to provide you with exceptional heart care, we have created designated Provider Care Teams. These Care Teams include your primary Cardiologist (physician) and Advanced Practice Providers (APPs- Physician Assistants and Nurse Practitioners) who all work together to provide you with the care you need, when you need it.   You may see any of the following providers on your designated Care Team at your next follow up: Marland Kitchen Dr Glori Bickers . Dr Loralie Champagne . Darrick Grinder, NP . Lyda Jester, PA . Audry Riles, PharmD   Please be sure to bring in all your medications bottles to every appointment.

## 2020-08-16 ENCOUNTER — Other Ambulatory Visit: Payer: Self-pay | Admitting: Nurse Practitioner

## 2020-08-16 DIAGNOSIS — M545 Low back pain, unspecified: Secondary | ICD-10-CM

## 2020-08-27 ENCOUNTER — Other Ambulatory Visit: Payer: Self-pay | Admitting: Nurse Practitioner

## 2020-08-27 ENCOUNTER — Ambulatory Visit
Admission: RE | Admit: 2020-08-27 | Discharge: 2020-08-27 | Disposition: A | Discharge status: Home / Self Care | Payer: Medicare Other | Source: Ambulatory Visit | Attending: Nurse Practitioner | Admitting: Nurse Practitioner

## 2020-08-27 ENCOUNTER — Other Ambulatory Visit: Payer: Self-pay

## 2020-08-27 DIAGNOSIS — M545 Low back pain, unspecified: Secondary | ICD-10-CM

## 2020-08-27 MED ORDER — METHYLPREDNISOLONE ACETATE 40 MG/ML INJ SUSP (RADIOLOG
120.0000 mg | Freq: Once | INTRAMUSCULAR | Status: AC
  Administered 2020-08-27: 80 mg via EPIDURAL

## 2020-08-27 MED ORDER — IOPAMIDOL (ISOVUE-M 200) INJECTION 41%
1.0000 mL | Freq: Once | INTRAMUSCULAR | Status: AC
  Administered 2020-08-27: 1 mL via EPIDURAL

## 2020-08-27 NOTE — Discharge Instructions (Signed)
Post Procedure Spinal Discharge Instruction Sheet  1. You may resume a regular diet and any medications that you routinely take (including pain medications).  2. No driving day of procedure.  3. Light activity throughout the rest of the day.  Do not do any strenuous work, exercise, bending or lifting.  The day following the procedure, you can resume normal physical activity but you should refrain from exercising or physical therapy for at least three days thereafter.   Common Side Effects:   Headaches- take your usual medications as directed by your physician.  Increase your fluid intake.  Caffeinated beverages may be helpful.  Lie flat in bed until your headache resolves.   Restlessness or inability to sleep- you may have trouble sleeping for the next few days.  Ask your referring physician if you need any medication for sleep.   Facial flushing or redness- should subside within a few days.   Increased pain- a temporary increase in pain a day or two following your procedure is not unusual.  Take your pain medication as prescribed by your referring physician.   Leg cramps  Please contact our office at [phone] for the following symptoms:  Fever greater than 100 degrees.  Headaches unresolved with medication after 2-3 days.  Increased swelling, pain, or redness at injection site.   Thank you for visiting [facility] today.

## 2020-10-16 ENCOUNTER — Other Ambulatory Visit: Payer: Self-pay

## 2020-10-16 ENCOUNTER — Telehealth: Payer: Self-pay

## 2020-10-16 ENCOUNTER — Ambulatory Visit: Payer: Medicare Other | Admitting: Gastroenterology

## 2020-10-16 ENCOUNTER — Encounter: Payer: Self-pay | Admitting: Gastroenterology

## 2020-10-16 VITALS — BP 117/62 | HR 107 | Temp 97.8°F | Ht 66.0 in | Wt 154.4 lb

## 2020-10-16 DIAGNOSIS — K3189 Other diseases of stomach and duodenum: Secondary | ICD-10-CM | POA: Diagnosis not present

## 2020-10-16 DIAGNOSIS — R1319 Other dysphagia: Secondary | ICD-10-CM

## 2020-10-16 DIAGNOSIS — K219 Gastro-esophageal reflux disease without esophagitis: Secondary | ICD-10-CM | POA: Insufficient documentation

## 2020-10-16 NOTE — Telephone Encounter (Signed)
PA for EGD/DIL submitted via [facility] website. PA# [id number], valid 11/21/20-02/19/21.

## 2020-10-16 NOTE — Progress Notes (Signed)
Primary Care Physician: Lemmie Evens, MD  Primary Gastroenterologist:  Garfield Cornea, MD   Chief Complaint  Patient presents with   Dysphagia    Food and medications getting stuck in esophagus    HPI: [name] is a 75 y.o. female here for follow-up.  Patient last seen in December 2021.  Completed EGD and colonoscopy in September 2021. She had a normal esophagus, dilated due to history of dysphagia.  Evidence of prior hemigastrectomy.  Small bowel nodule at the anastomosis, biopsied with mild reactive changes and focal intestinal metaplasia.  Repeat EGD recommended in 1 year.  On colonoscopy she had couple of tubular adenomas removed, noncompliant left colon, diverticulosis.  Patient states she has recurrent dysphagia to pills and solid foods.  Prior esophageal dilation seem to help.  Finely chops her food, particularly meats.  Has to use plenty of fluid to wash her foods down.  Pills want to get stuck but eventually to go down.  Denies odynophagia. No heartburn. No vomiting. No problems swallowing liquids.  Bowel movements regular.  No blood in the stool or melena.  Biggest complaint is that of back pain which is chronic.  Continues to follow at the CHF clinic.  States she is on oxygen as needed.  She does not use on a regular basis.  No blood thinners or asa.     Current Outpatient Medications  Medication Sig Dispense Refill   amLODipine (NORVASC) 10 MG tablet Take 1 tablet (10 mg total) by mouth daily. 30 tablet 11   clonazePAM (KLONOPIN) 1 MG tablet Take 1 mg by mouth 3 (three) times daily as needed for anxiety.      dapagliflozin propanediol (FARXIGA) 10 MG TABS tablet Take 1 tablet (10 mg total) by mouth daily before breakfast. 30 tablet 11   escitalopram (LEXAPRO) 10 MG tablet Take 10 mg by mouth daily.      furosemide (LASIX) 40 MG tablet Take 1 tablet (40 mg total) by mouth daily. 30 tablet 11   gabapentin (NEURONTIN) 300 MG capsule Take 300 mg by mouth  3 (three) times daily.     HYDROcodone-acetaminophen (NORCO) 10-325 MG tablet Take 1 tablet by mouth every 4 (four) hours as needed for moderate pain.      pantoprazole (PROTONIX) 40 MG tablet Take 40 mg by mouth at bedtime.     potassium chloride (KLOR-CON) 10 MEQ tablet TAKE (1) TABLET BY MOUTH TWICE DAILY. 60 tablet 11   PROAIR HFA 108 (90 Base) MCG/ACT inhaler Inhale 2 puffs into the lungs every 4 (four) hours as needed for wheezing or shortness of breath.      No current facility-administered medications for this visit.    Allergies as of 10/16/2020 - Review Complete 10/16/2020  Allergen Reaction Noted   Chantix [varenicline] Nausea And Vomiting 12/01/2016   Codeine Other (See Comments) 12/01/2016   Oxycodone Palpitations 01/11/2017   Past Medical History:  Diagnosis Date   Arthritis    CHF (congestive heart failure) (HCC)    Chronic back pain    COPD (chronic obstructive pulmonary disease) (HCC)    patient is on oxygen at night when needed   Depression    GERD (gastroesophageal reflux disease)    Heart failure (HCC)    RV   Hypertension    Pulmonary HTN (HCC)    Thyroid disease    Ulcer    Billroth I   Past Surgical History:  Procedure Laterality Date   ABDOMINAL HYSTERECTOMY  BACK SURGERY     X4   BILROTH I PROCEDURE     BIOPSY  12/18/2019   Procedure: BIOPSY;  Surgeon: Daneil Dolin, MD;  [facility];  Service: Endoscopy;;   CHOLECYSTECTOMY     COLONOSCOPY   05/18/2003   JSH:[id number] colonoscopy/ Internal hemorrhoids.  Otherwise, normal rectum   COLONOSCOPY  08/12/2010   Dr. Arnoldo Morale: cecum visualized and normal, colon and rectum normal. Torturous colon   COLONOSCOPY N/A 09/07/2012   RMR: tubular adenoma, lipoma. Due for surveillance in 2021   COLONOSCOPY WITH PROPOFOL N/A 12/18/2019   Rourk: Three 2-16m polyps removed from the descending colon, tubular adenomas.  Diverticulosis.  Noncompliant left colon.   ESOPHAGOGASTRODUODENOSCOPY  05/18/2003    RMR:. Normal esophagus/Adenomatous-appearing mucosa at the anastomosis   ESOPHAGOGASTRODUODENOSCOPY  08/12/2010   Dr. JArnoldo Morale anastomosis widely patent, no ulcerations, CLO test negative   ESOPHAGOGASTRODUODENOSCOPY (EGD) WITH PROPOFOL N/A 12/18/2019   Rourk: Normal esophagus status post dilation.  Prior hemigastrectomy.  Small bowel nodule of uncertain significance.  Biopsy with mild reactive changes and focal intestinal metaplasia.  Recommend 1 year follow-up EGD.   MALONEY DILATION N/A 12/18/2019   Procedure: MVenia MinksDILATION;  Surgeon: RDaneil Dolin MD;  [facility];  Service: Endoscopy;  Laterality: N/A;   POLYPECTOMY  12/18/2019   Procedure: POLYPECTOMY;  Surgeon: RDaneil Dolin MD;  [facility];  Service: Endoscopy;;   RIGHT HEART CATH N/A 05/04/2018   Procedure: RIGHT HEART CATH;  Surgeon: MLarey Dresser MD;  [facility];  Service: Cardiovascular;  Laterality: N/A;   STOMACH SURGERY     removed partial stomach   YAG LASER APPLICATION Left 150/27/7412  Procedure: YAG LASER APPLICATION;  Surgeon: MRutherford Guys MD;  Location: [facility];  Service: Ophthalmology;  Laterality: Left;   YAG LASER APPLICATION Right 187/86/7672  Procedure: YAG LASER APPLICATION;  Surgeon: MRutherford Guys MD;  Location: [facility];  Service: Ophthalmology;  Laterality: Right;   Family History  Problem Relation Age of Onset   Cancer Sister        breast cancer   Cancer Brother 547      esophageal cancer   Cancer Maternal Aunt        pancreas?   Cancer Paternal Aunt        multiple aunts had cancer   Colon cancer Neg Hx    Social History   Tobacco Use   Smoking status: Every Day    Packs/day: 0.30    Years: 15.00    Pack years: 4.50    Types: Cigarettes   Smokeless tobacco: Never  Vaping Use   Vaping Use: Never used  Substance Use Topics   Alcohol use: No   Drug use: No    ROS:  General: Negative for anorexia, weight loss, fever, chills, fatigue,  weakness. ENT: Negative for hoarseness,  nasal congestion.  See HPI CV: Negative for chest pain, angina, palpitations, dyspnea on exertion, peripheral edema.  Respiratory: Negative for dyspnea at rest, dyspnea on exertion, cough, sputum, wheezing.  GI: See history of present illness. GU:  Negative for dysuria, hematuria, urinary incontinence, urinary frequency, nocturnal urination.  Endo: Negative for unusual weight change.    Physical Examination:   BP 117/62   Pulse (!) 107   Temp 97.8 F (36.6 C) (Temporal)   Ht _0  (1.676 m)   Wt 154 lb 6.4 oz (70 kg)   BMI 24.92 kg/m   General:  Well-nourished, well-developed in no acute distress.  Eyes: No icterus. Mouth: masked. Lungs: Clear to auscultation bilaterally.  Heart: Regular rate and rhythm, no murmurs rubs or gallops.  Abdomen: Bowel sounds are normal, nontender, nondistended, no hepatosplenomegaly or masses, no abdominal bruits or hernia , no rebound or guarding.   Extremities: No lower extremity edema. No clubbing or deformities. Neuro: Alert and oriented x 4   Skin: Warm and dry, no jaundice.   Psych: Alert and cooperative, normal mood and affect.  Labs:  Lab Results  Component Value Date   CREATININE 0.73 08/08/2020   BUN 12 08/08/2020   NA 135 08/08/2020   K 4.5 08/08/2020   CL 96 (L) 08/08/2020   CO2 33 (H) 08/08/2020       Imaging Studies: No results found.   Assessment:  Pleasant 75 year old female with history of Billroth I configuration, GERD/dysphagia presenting for follow-up.  She is due for 1 year repeat EGD for small bowel nodule at the anastomosis, biopsy showed mild reactive changes and focal intestinal metaplasia.  Her reflux is well controlled but she complains of recurrent pill/solid food dysphagia.  Esophagus was dilated back in September 2021, she found this to be helpful up until recently.  She would like to have her esophagus stretched again if possible.   Plan: Continue pantoprazole  40 mg daily before breakfast. Upper endoscopy with possible esophageal dilation with Dr. Gala Romney with propofol. ASA III.  I have discussed the risks, alternatives, benefits with regards to but not limited to the risk of reaction to medication, bleeding, infection, perforation and the patient is agreeable to proceed. Written consent to be obtained.

## 2020-10-16 NOTE — Patient Instructions (Addendum)
Continue pantoprazole 40 mg daily before breakfast. Continue to chew food thoroughly, finely chopped meats, use plenty of liquids with meals and taking medications. Upper endoscopy in the near future.  See separate instructions.

## 2020-11-07 ENCOUNTER — Telehealth (INDEPENDENT_AMBULATORY_CARE_PROVIDER_SITE_OTHER): Payer: Self-pay | Admitting: *Deleted

## 2020-11-07 ENCOUNTER — Telehealth: Payer: Self-pay | Admitting: *Deleted

## 2020-11-07 NOTE — Telephone Encounter (Signed)
done

## 2020-11-07 NOTE — Telephone Encounter (Signed)
Patient had left message on manager voice mail  She is confused on her instructions and has lost her information.  Can we call her and send her new instructions please.  [phone]  Thanks

## 2020-11-07 NOTE — Telephone Encounter (Signed)
Spoke to pt.  She misplaced prep instructions.  Reviewed all prep information with pt.  Routing to [facility] to mail out instructions.

## 2020-11-08 NOTE — Telephone Encounter (Signed)
See prior message. Already done

## 2020-11-12 NOTE — Patient Instructions (Addendum)
[name]  11/12/2020     _0 @   Your procedure is scheduled on 11/21/2020.   Report to [facility] at  1215  P.M.   Call this number if you have problems the morning of surgery:  [phone]   Remember:  Follow the diet instructions given to you by the office.    Take these medicines the morning of surgery with A SIP OF WATER         amlodipine, clonazepam, lexapro, gabapentin, hydrocodone (if needed)     Do not wear jewelry, make-up or nail polish.  Do not wear lotions, powders, or perfumes, or deodorant.  Do not shave 48 hours prior to surgery.  Men may shave face and neck.  Do not bring valuables to the hospital.  [facility] is not responsible for any belongings or valuables.  Contacts, dentures or bridgework may not be worn into surgery.  Leave your suitcase in the car.  After surgery it may be brought to your room.  For patients admitted to the hospital, discharge time will be determined by your treatment team.  Patients discharged the day of surgery will not be allowed to drive home and must have someone with them for 24 hours.    Special instructions:   DO NOT smoke tobacco or vape for 24 hours before your procedure.  Please read over the following fact sheets that you were given. Anesthesia Post-op Instructions and Care and Recovery After Surgery        Upper Endoscopy, Adult, Care After This sheet gives you information about how to care for yourself after your procedure. Your health care provider may also give you more specific instructions. If you have problems or questions, contact your health careprovider. What can I expect after the procedure? After the procedure, it is common to have: A sore throat. Mild stomach pain or discomfort. Bloating. Nausea. Follow these instructions at home:  Follow instructions from your health care provider about what to eat or drink after your procedure. Return to your normal  activities as told by your health care provider. Ask your health care provider what activities are safe for you. Take over-the-counter and prescription medicines only as told by your health care provider. If you were given a sedative during the procedure, it can affect you for several hours. Do not drive or operate machinery until your health care provider says that it is safe. Keep all follow-up visits as told by your health care provider. This is important. Contact a health care provider if you have: A sore throat that lasts longer than one day. Trouble swallowing. Get help right away if: You vomit blood or your vomit looks like coffee grounds. You have: A fever. Bloody, black, or tarry stools. A severe sore throat or you cannot swallow. Difficulty breathing. Severe pain in your chest or abdomen. Summary After the procedure, it is common to have a sore throat, mild stomach discomfort, bloating, and nausea. If you were given a sedative during the procedure, it can affect you for several hours. Do not drive or operate machinery until your health care provider says that it is safe. Follow instructions from your health care provider about what to eat or drink after your procedure. Return to your normal activities as told by your health care provider. This information is not intended to replace advice given to you by your health care provider. Make sure you discuss any questions you have with your healthcare provider. Document  Revised: 03/21/2019 Document Reviewed: 08/23/2017 Elsevier Patient Education  2022 [place]. https://www.asge.org/home/for-patients/patient-information/understanding-eso-dilation-updated">  Esophageal Dilatation Esophageal dilatation, also called esophageal dilation, is a procedure to widen or open a blocked or narrowed part of the esophagus. The esophagus is the part of the body that moves food and liquid from the mouth to the stomach. You may need this procedure  if: You have a buildup of scar tissue in your esophagus that makes it difficult, painful, or impossible to swallow. This can be caused by gastroesophageal reflux disease (GERD). You have cancer of the esophagus. There is a problem with how food moves through your esophagus. In some cases, you may need this procedure repeated at a later time to dilatethe esophagus gradually. Tell a health care provider about: Any allergies you have. All medicines you are taking, including vitamins, herbs, eye drops, creams, and over-the-counter medicines. Any problems you or family members have had with anesthetic medicines. Any blood disorders you have. Any surgeries you have had. Any medical conditions you have. Any antibiotic medicines you are required to take before dental procedures. Whether you are pregnant or may be pregnant. What are the risks? Generally, this is a safe procedure. However, problems may occur, including: Bleeding due to a tear in the lining of the esophagus. A hole, or perforation, in the esophagus. What happens before the procedure? Ask your health care provider about: Changing or stopping your regular medicines. This is especially important if you are taking diabetes medicines or blood thinners. Taking medicines such as aspirin and ibuprofen. These medicines can thin your blood. Do not take these medicines unless your health care provider tells you to take them. Taking over-the-counter medicines, vitamins, herbs, and supplements. Follow instructions from your health care provider about eating or drinking restrictions. Plan to have a responsible adult take you home from the hospital or clinic. Plan to have a responsible adult care for you for the time you are told after you leave the hospital or clinic. This is important. What happens during the procedure? You may be given a medicine to help you relax (sedative). A numbing medicine may be sprayed into the back of your throat, or  you may gargle the medicine. Your health care provider may perform the dilatation using various surgical instruments, such as: Simple dilators. This instrument is carefully placed in the esophagus to stretch it. Guided wire bougies. This involves using an endoscope to insert a wire into the esophagus. A dilator is passed over this wire to enlarge the esophagus. Then the wire is removed. Balloon dilators. An endoscope with a small balloon is inserted into the esophagus. The balloon is inflated to stretch the esophagus and open it up. The procedure may vary among health care providers and hospitals. What can I expect after the procedure? Your blood pressure, heart rate, breathing rate, and blood oxygen level will be monitored until you leave the hospital or clinic. Your throat may feel slightly sore and numb. This will get better over time. You will not be allowed to eat or drink until your throat is no longer numb. When you are able to drink, urinate, and sit on the edge of the bed without nausea or dizziness, you may be able to return home. Follow these instructions at home: Take over-the-counter and prescription medicines only as told by your health care provider. If you were given a sedative during the procedure, it can affect you for several hours. Do not drive or operate machinery until your health care provider  says that it is safe. Plan to have a responsible adult care for you for the time you are told. This is important. Follow instructions from your health care provider about any eating or drinking restrictions. Do not use any products that contain nicotine or tobacco, such as cigarettes, e-cigarettes, and chewing tobacco. If you need help quitting, ask your health care provider. Keep all follow-up visits. This is important. Contact a health care provider if: You have a fever. You have pain that is not relieved by medicine. Get help right away if: You have chest pain. You have trouble  breathing. You have trouble swallowing. You vomit blood. You have black, tarry, or bloody stools. These symptoms may represent a serious problem that is an emergency. Do not wait to see if the symptoms will go away. Get medical help right away. Call your local emergency services (911 in the [place] Do not drive yourself to the hospital. Summary Esophageal dilatation, also called esophageal dilation, is a procedure to widen or open a blocked or narrowed part of the esophagus. Plan to have a responsible adult take you home from the hospital or clinic. For this procedure, a numbing medicine may be sprayed into the back of your throat, or you may gargle the medicine. Do not drive or operate machinery until your health care provider says that it is safe. This information is not intended to replace advice given to you by your health care provider. Make sure you discuss any questions you have with your healthcare provider. Document Revised: 08/09/2019 Document Reviewed: 08/09/2019 Elsevier Patient Education  [place] After This sheet gives you information about how to care for yourself after your procedure. Your health care provider may also give you more specific instructions. If you have problems or questions, contact your health careprovider. What can I expect after the procedure? After the procedure, it is common to have: Tiredness. Forgetfulness about what happened after the procedure. Impaired judgment for important decisions. Nausea or vomiting. Some difficulty with balance. Follow these instructions at home: For the time period you were told by your health care provider:     Rest as needed. Do not participate in activities where you could fall or become injured. Do not drive or use machinery. Do not drink alcohol. Do not take sleeping pills or medicines that cause drowsiness. Do not make important decisions or sign legal documents. Do not  take care of children on your own. Eating and drinking Follow the diet that is recommended by your health care provider. Drink enough fluid to keep your urine pale yellow. If you vomit: Drink water, juice, or soup when you can drink without vomiting. Make sure you have little or no nausea before eating solid foods. General instructions Have a responsible adult stay with you for the time you are told. It is important to have someone help care for you until you are awake and alert. Take over-the-counter and prescription medicines only as told by your health care provider. If you have sleep apnea, surgery and certain medicines can increase your risk for breathing problems. Follow instructions from your health care provider about wearing your sleep device: Anytime you are sleeping, including during daytime naps. While taking prescription pain medicines, sleeping medicines, or medicines that make you drowsy. Avoid smoking. Keep all follow-up visits as told by your health care provider. This is important. Contact a health care provider if: You keep feeling nauseous or you keep vomiting. You feel light-headed. You  are still sleepy or having trouble with balance after 24 hours. You develop a rash. You have a fever. You have redness or swelling around the IV site. Get help right away if: You have trouble breathing. You have new-onset confusion at home. Summary For several hours after your procedure, you may feel tired. You may also be forgetful and have poor judgment. Have a responsible adult stay with you for the time you are told. It is important to have someone help care for you until you are awake and alert. Rest as told. Do not drive or operate machinery. Do not drink alcohol or take sleeping pills. Get help right away if you have trouble breathing, or if you suddenly become confused. This information is not intended to replace advice given to you by your health care provider. Make sure you  discuss any questions you have with your healthcare provider. Document Revised: 12/07/2019 Document Reviewed: 02/23/2019 Elsevier Patient Education  2022 [employer].

## 2020-11-18 ENCOUNTER — Other Ambulatory Visit: Payer: Self-pay

## 2020-11-18 ENCOUNTER — Encounter (HOSPITAL_COMMUNITY): Payer: Self-pay

## 2020-11-18 ENCOUNTER — Encounter (HOSPITAL_COMMUNITY)
Admission: RE | Admit: 2020-11-18 | Discharge: 2020-11-18 | Disposition: A | Discharge status: Home / Self Care | Payer: Medicare Other | Source: Ambulatory Visit | Attending: Internal Medicine | Admitting: Internal Medicine

## 2020-11-18 DIAGNOSIS — Z01812 Encounter for preprocedural laboratory examination: Secondary | ICD-10-CM | POA: Insufficient documentation

## 2020-11-18 HISTORY — DX: Polyneuropathy, unspecified: G62.9

## 2020-11-18 LAB — BASIC METABOLIC PANEL
Anion gap: 9 (ref 5–15)
BUN: 10 mg/dL (ref 8–23)
CO2: 30 mmol/L (ref 22–32)
Calcium: 8.5 mg/dL — ABNORMAL LOW (ref 8.9–10.3)
Chloride: 94 mmol/L — ABNORMAL LOW (ref 98–111)
Creatinine, Ser: 0.65 mg/dL (ref 0.44–1.00)
GFR, Estimated: >60 mL/min (ref >60)
Glucose, Bld: 98 mg/dL (ref 70–99)
Potassium: 4.2 mmol/L (ref 3.5–5.1)
Sodium: 133 mmol/L — ABNORMAL LOW (ref 135–145)

## 2020-11-20 ENCOUNTER — Ambulatory Visit (HOSPITAL_COMMUNITY): Payer: Medicare Other | Admitting: Anesthesiology

## 2020-11-21 ENCOUNTER — Telehealth: Payer: Self-pay | Admitting: *Deleted

## 2020-11-21 ENCOUNTER — Other Ambulatory Visit: Payer: Self-pay

## 2020-11-21 ENCOUNTER — Ambulatory Visit (HOSPITAL_COMMUNITY)
Admission: RE | Admit: 2020-11-21 | Discharge: 2020-11-21 | Disposition: A | Discharge status: Home / Self Care | Payer: Medicare Other | Attending: Internal Medicine | Admitting: Internal Medicine

## 2020-11-21 ENCOUNTER — Encounter (HOSPITAL_COMMUNITY): Payer: Self-pay | Admitting: Internal Medicine

## 2020-11-21 ENCOUNTER — Encounter (HOSPITAL_COMMUNITY)
Admission: RE | Disposition: A | Discharge status: Home / Self Care | Payer: Self-pay | Source: Home / Self Care | Attending: Internal Medicine

## 2020-11-21 ENCOUNTER — Encounter: Payer: Self-pay | Admitting: *Deleted

## 2020-11-21 DIAGNOSIS — J449 Chronic obstructive pulmonary disease, unspecified: Secondary | ICD-10-CM | POA: Insufficient documentation

## 2020-11-21 DIAGNOSIS — I110 Hypertensive heart disease with heart failure: Secondary | ICD-10-CM | POA: Insufficient documentation

## 2020-11-21 DIAGNOSIS — I509 Heart failure, unspecified: Secondary | ICD-10-CM | POA: Insufficient documentation

## 2020-11-21 DIAGNOSIS — K6389 Other specified diseases of intestine: Secondary | ICD-10-CM | POA: Diagnosis not present

## 2020-11-21 DIAGNOSIS — R0902 Hypoxemia: Secondary | ICD-10-CM | POA: Insufficient documentation

## 2020-11-21 DIAGNOSIS — Z5309 Procedure and treatment not carried out because of other contraindication: Secondary | ICD-10-CM | POA: Insufficient documentation

## 2020-11-21 DIAGNOSIS — R1310 Dysphagia, unspecified: Secondary | ICD-10-CM | POA: Diagnosis present

## 2020-11-21 LAB — GLUCOSE, CAPILLARY: Glucose-Capillary: 109 mg/dL — ABNORMAL HIGH (ref 70–99)

## 2020-11-21 SURGERY — ESOPHAGOGASTRODUODENOSCOPY (EGD) WITH PROPOFOL
Anesthesia: Monitor Anesthesia Care

## 2020-11-21 MED ORDER — LACTATED RINGERS IV SOLN: INTRAVENOUS | Status: DC

## 2020-11-21 MED ORDER — IPRATROPIUM-ALBUTEROL 0.5-2.5 (3) MG/3ML IN SOLN
RESPIRATORY_TRACT | Status: AC
  Administered 2020-11-21: 3 mL
  Filled 2020-11-21: qty 3

## 2020-11-21 MED ORDER — IPRATROPIUM-ALBUTEROL 0.5-2.5 (3) MG/3ML IN SOLN: 3.0000 mL | RESPIRATORY_TRACT | Status: DC

## 2020-11-21 NOTE — Telephone Encounter (Signed)
Spoke with pt. She has been rescheduled to 9/23 at 11:45am. Aware will mail prep instrucitons with new pre-op appt. Informed of RMR recommendations below. She voiced understanding.   Hassan Rowan, LPN  Note PA for EGD/DIL submitted via [facility] website. PA# [id number], valid 11/21/20-02/19/21.

## 2020-11-21 NOTE — Progress Notes (Signed)
Pt's procedure was cancelled b/c of low oxygen saturations. Pt was put on 2.5 -3 L of oxygen and given a Duoneb per Dr. Roselle Locus order. Pt still had the inability to maintain SATs. Dr. Charna Elizabeth cancelled case for safety. Pt was discharged to home on 3 L of oxygen. Pt had no complaints of SOB. Pt's SATs were in the high 80's - low 90's on 3 L. Pt was advised to f/u with primary care doctor to re-assess respiratory status. She stated she would but refused to go to emergency room.

## 2020-11-21 NOTE — Telephone Encounter (Signed)
-----  Message from Daneil Dolin, MD sent at 11/21/2020  8:52 AM EDT ----- Procedure canceled by anesthesia.  Patient came to the hospital and was found to have O2 sats in the 50s which rapidly came up.  Did not take her inhaler this morning.  Was not using her oxygen tent overnight (because her husband was using it).  She needs to be rescheduled and she should use her oxygen tent night before and morning of the procedure.  She should bring her tank with her.  She should use her inhalers first thing prior to coming to the hospital.  She needs to be rescheduled.  Recommendations per anesthesia.  Lets go with them.

## 2020-12-10 ENCOUNTER — Encounter (HOSPITAL_COMMUNITY): Payer: Medicare Other

## 2020-12-18 ENCOUNTER — Other Ambulatory Visit: Payer: Self-pay

## 2020-12-18 ENCOUNTER — Encounter (HOSPITAL_COMMUNITY): Payer: Self-pay

## 2020-12-18 ENCOUNTER — Ambulatory Visit (HOSPITAL_COMMUNITY)
Admission: RE | Admit: 2020-12-18 | Discharge: 2020-12-18 | Disposition: A | Discharge status: Home / Self Care | Payer: Medicare Other | Source: Ambulatory Visit | Attending: Family Medicine | Admitting: Family Medicine

## 2020-12-18 VITALS — BP 128/76 | HR 97 | Wt 151.0 lb

## 2020-12-18 DIAGNOSIS — Z7984 Long term (current) use of oral hypoglycemic drugs: Secondary | ICD-10-CM | POA: Insufficient documentation

## 2020-12-18 DIAGNOSIS — F17210 Nicotine dependence, cigarettes, uncomplicated: Secondary | ICD-10-CM | POA: Diagnosis not present

## 2020-12-18 DIAGNOSIS — I110 Hypertensive heart disease with heart failure: Secondary | ICD-10-CM | POA: Insufficient documentation

## 2020-12-18 DIAGNOSIS — I5022 Chronic systolic (congestive) heart failure: Secondary | ICD-10-CM | POA: Diagnosis not present

## 2020-12-18 DIAGNOSIS — I272 Pulmonary hypertension, unspecified: Secondary | ICD-10-CM | POA: Diagnosis not present

## 2020-12-18 DIAGNOSIS — J449 Chronic obstructive pulmonary disease, unspecified: Secondary | ICD-10-CM | POA: Insufficient documentation

## 2020-12-18 DIAGNOSIS — I10 Essential (primary) hypertension: Secondary | ICD-10-CM

## 2020-12-18 DIAGNOSIS — I50810 Right heart failure, unspecified: Secondary | ICD-10-CM | POA: Diagnosis not present

## 2020-12-18 DIAGNOSIS — J441 Chronic obstructive pulmonary disease with (acute) exacerbation: Secondary | ICD-10-CM

## 2020-12-18 DIAGNOSIS — I2723 Pulmonary hypertension due to lung diseases and hypoxia: Secondary | ICD-10-CM | POA: Diagnosis not present

## 2020-12-18 DIAGNOSIS — Z09 Encounter for follow-up examination after completed treatment for conditions other than malignant neoplasm: Secondary | ICD-10-CM | POA: Diagnosis not present

## 2020-12-18 DIAGNOSIS — Z79899 Other long term (current) drug therapy: Secondary | ICD-10-CM | POA: Insufficient documentation

## 2020-12-18 DIAGNOSIS — Z7901 Long term (current) use of anticoagulants: Secondary | ICD-10-CM | POA: Insufficient documentation

## 2020-12-18 LAB — BASIC METABOLIC PANEL
Anion gap: 9 (ref 5–15)
BUN: 10 mg/dL (ref 8–23)
CO2: 28 mmol/L (ref 22–32)
Calcium: 9.3 mg/dL (ref 8.9–10.3)
Chloride: 98 mmol/L (ref 98–111)
Creatinine, Ser: 0.73 mg/dL (ref 0.44–1.00)
GFR, Estimated: >60 mL/min (ref >60)
Glucose, Bld: 90 mg/dL (ref 70–99)
Potassium: 4.2 mmol/L (ref 3.5–5.1)
Sodium: 135 mmol/L (ref 135–145)

## 2020-12-18 NOTE — Patient Instructions (Signed)
Labs done today. We will contact you only if your labs are abnormal.  No medication changes were made. Please continue all current medications as prescribed.  Your physician recommends that you schedule a follow-up appointment in: 4 months. Please contact our office in December to schedule a January appointment.   If you have any questions or concerns before your next appointment please send [place] a message through [place] or call our office at [phone].    TO LEAVE A MESSAGE FOR THE NURSE SELECT OPTION 2, PLEASE LEAVE A MESSAGE INCLUDING: YOUR NAME DATE OF BIRTH CALL BACK NUMBER REASON FOR CALL**this is important as we prioritize the call backs  YOU WILL RECEIVE A CALL BACK THE SAME DAY AS LONG AS YOU CALL BEFORE 4:00 PM   Do the following things EVERYDAY: Weigh yourself in the morning before breakfast. Write it down and keep it in a log. Take your medicines as prescribed Eat low salt foods--Limit salt (sodium) to 2000 mg per day.  Stay as active as you can everyday Limit all fluids for the day to less than 2 liters   At the [facility], you and your health needs are our priority. As part of our continuing mission to provide you with exceptional heart care, we have created designated Provider Care Teams. These Care Teams include your primary Cardiologist (physician) and Advanced Practice Providers (APPs- Physician Assistants and Nurse Practitioners) who all work together to provide you with the care you need, when you need it.   You may see any of the following providers on your designated Care Team at your next follow up: Dr Glori Bickers Dr Haynes Kerns, NP Lyda Jester, [place] Audry Riles, PharmD   Please be sure to bring in all your medications bottles to every appointment.

## 2020-12-18 NOTE — Progress Notes (Signed)
PCP: Dr. Karie Kirks Cardiology: Dr. Harrington Challenger HF Cardiology: Dr. Aundra Dubin  75 y.o. smoker with COPD and HTN presents for followup of pulmonary hypertension.  Echo in 1/20 showed severe RV dilation and dysfunction.  RHC in 1/20 showed moderate pulmonary hypertension with elevated PVR and low cardiac output.  PCWP was normal.  PFTs showed moderate-severe obstruction.  V/Q scan showed no chronic PE. PFTs showed moderate to severe obstruction/COPD.    She was started on Opsumit given concern for pulmonary hypertension out of proportion to lung disease.  She had to stop this due to side effects.  She started on tadalafil and did well on it with improved symptoms.   Echo in 7/20 showed EF 60-65% with moderate RV dilation and mildly decreased systolic function, D-shaped septum, mild-moderate TR, PASP down to 45 mmHg (76 mmHg on last echo).  She developed angioedema, hard to tell if it was from spironolactone or tadalafil, so she is off both medications.    In 2/21, she was seen in the ER with dyspnea and found to have possible RLL PNA.  She was treated with levofloxacin.   She did not tolerate Tyvaso and has stopped it.   Echo in 10/21 showed EF 60-65%, normal RV, PASP 54 mmHg, normal IVC.   Today she returns for HF follow up. She is the caregiver for her husband. Says she is feeling great. No significant exertional dyspnea.  Denies CP, dizziness, edema, or PND/Orthopnea. Appetite ok. Weight at home 150 pounds. Taking all medications. Smoking 3 cigarettes/day, although she says she does not inhale. She is not wearing oxygen, sometimes uses it at night.  6 minute walk (2/20): 298 m 6 minute walk (7/20): 152 m 6 minute walk (9/20): 304 m 6 minute walk (2/21): 249 m  Labs (1/20): K 4, creatinine 0.8, TSH normal, BNP 994 Labs (2/20): HIV negative, SCL-70 negative, centromere Ab negative, RF negative Labs (5/20): BNP 140, ANA negative, K 3.9, creatinine 0.74 Labs (7/20): K 4.1, creatinine 0.77 Labs (9/20): K  4.1, creatinine 0.79 Labs (2/21): BNP 685, K 3.8, creatinine 0.46 Labs (4/21): K 4.4, creatinine 0.52 Labs (9/21): K 4.1, creatinine 0.66 Labs (3/22): BNP 41, K 4.1, creatinine 0.71 Labs (8/22): K 4.2, creatinine 0.65  PMH: 1. HTN 2. COPD: Active smoker.  - PFTs (2/20): FVC 51%, FEV1 57%, ratio 72%, TLC 93%, DLCO 51% => moderate-severe obstructive disease.  3. H/o cholecystectomy 4. OA 5. H/o back surgery 6. Pulmonary hypertension:  - Echo (1/20): EF 60-65%, mild LVH, severe RV dilation with severely decreased RV systolic function, PASP 76 mmHg.  - RHC (1/20): mean RA 10, PA 55/21 mean 36, mean PCWP 7, CI 1.6, PVR 10 WU.  - V/Q scan (2/20): Not suggestive of chronic PE.  - CTA chest (1/20): No PE, no evidence for ILD.  - PFTs (2/20): Moderate to severe obstructive lung disease. - Echo (7/20): EF 60-65%, with moderate RV dilation and mildly decreased systolic function, D-shaped septum, mild-moderate TR, PASP down to 45 mmHg (76 mmHg on last echo) - Unable to tolerate Opsumit.  - Possible angioedema with tadalafil.  - Unable to tolerate Tyvaso - Echo (10/21): EF 60-65%, normal RV, PASP 54 mmHg, normal IVC.   Family History  Problem Relation Age of Onset   Cancer Sister        breast cancer   Cancer Brother 14       esophageal cancer   Cancer Maternal Aunt        pancreas?   Cancer  Paternal Aunt        multiple aunts had cancer   Colon cancer Neg Hx    Social History   Socioeconomic History   Marital status: Married    Spouse name: Not on file   Number of children: Not on file   Years of education: Not on file   Highest education level: Not on file  Occupational History   Not on file  Tobacco Use   Smoking status: Every Day    Packs/day: 0.30    Years: 15.00    Pack years: 4.50    Types: Cigarettes   Smokeless tobacco: Never  Vaping Use   Vaping Use: Never used  Substance and Sexual Activity   Alcohol use: No   Drug use: No   Sexual activity: Not Currently   Other Topics Concern   Not on file  Social History Narrative   Not on file   Social Determinants of Health   Financial Resource Strain: Not on file  Food Insecurity: Not on file  Transportation Needs: Not on file  Physical Activity: Not on file  Stress: Not on file  Social Connections: Not on file  Intimate Partner Violence: Not on file   ROS: All systems reviewed and negative except as per HPI.   Current Outpatient Medications  Medication Sig Dispense Refill   albuterol (PROVENTIL) (2.5 MG/3ML) 0.083% nebulizer solution Take 2.5 mg by nebulization in the morning and at bedtime.     amLODipine (NORVASC) 10 MG tablet Take 1 tablet (10 mg total) by mouth daily. 30 tablet 11   clonazePAM (KLONOPIN) 1 MG tablet Take 0.5-1 mg by mouth 4 (four) times daily as needed for anxiety.     COMBIVENT RESPIMAT 20-100 MCG/ACT AERS respimat Inhale 1 puff into the lungs in the morning and at bedtime.     dapagliflozin propanediol (FARXIGA) 10 MG TABS tablet Take 1 tablet (10 mg total) by mouth daily before breakfast. 30 tablet 11   escitalopram (LEXAPRO) 10 MG tablet Take 10 mg by mouth at bedtime.     furosemide (LASIX) 40 MG tablet Take 1 tablet (40 mg total) by mouth daily. 30 tablet 11   gabapentin (NEURONTIN) 300 MG capsule Take 300 mg by mouth 3 (three) times daily.     HYDROcodone-acetaminophen (NORCO) 10-325 MG tablet Take 1 tablet by mouth every 4 (four) hours as needed (PAIN).     pantoprazole (PROTONIX) 40 MG tablet Take 40 mg by mouth at bedtime.     potassium chloride (KLOR-CON) 10 MEQ tablet TAKE (1) TABLET BY MOUTH TWICE DAILY. 60 tablet 11   No current facility-administered medications for this encounter.   Wt Readings from Last 3 Encounters:  12/18/20 68.5 kg (151 lb)  11/18/20 70.3 kg (155 lb)  10/16/20 70 kg (154 lb 6.4 oz)   BP 128/76   Pulse 97   Wt 68.5 kg (151 lb)   SpO2 95%   BMI 24.37 kg/m   General:  NAD. No resp difficulty, thin HEENT: Normal Neck: Supple. No  JVD. Carotids 2+ bilat; no bruits. No lymphadenopathy or thryomegaly appreciated. Cor: PMI nondisplaced. Regular rate & rhythm. No rubs, gallops or murmurs. Lungs: Clear Abdomen: Soft, nontender, nondistended. No hepatosplenomegaly. No bruits or masses. Good bowel sounds. Extremities: No cyanosis, clubbing, rash, edema Neuro: Alert & oriented x 3, cranial nerves grossly intact. Moves all 4 extremities w/o difficulty. Affect pleasant.  Assessment/Plan: 1. Pulmonary hypertension: Echo 1/20 with severe RV dilation/dysfunction.  RHC with moderate PH, high  PVR, normal PCWP, low cardiac output.  No evidence for chronic PE by V/Q scan.  PFTs show a moderate to severe obstructive defect. Rheumatological serologies negative. She still smokes.  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  Unable to tolerate Opsumit. Her breathing was better on tadalafil, but she is now off this medication due to concern that it caused angioedema.  She did not tolerate Tyvaso. Echo in 10/21 showed normal RV function, PASP 54 mmHg.  -  She has not tolerated multiple PH medications, will keep her off for the time being as she is doing reasonably well.  2. RV failure: NYHA class II, she does not look volume overloaded.  - Continue Lasix 40 mg daily, BMET today.   - She is off spironolactone with possible angioedema.  - Continue dapagliflozin.  3. COPD/smoking: She continues to smoke.  She did not tolerate Chantix and Wellbutrin did not work.  She is not using oxygen.   - Encouraged her to keep work on quitting.  4. HTN: BP controlled.   Followup in 4 months with Dr. Aundra Dubin.  [place], [place] 12/18/2020

## 2020-12-19 ENCOUNTER — Encounter (HOSPITAL_COMMUNITY): Payer: Self-pay

## 2020-12-23 NOTE — Patient Instructions (Signed)
[name]  12/23/2020     _0 @   Your procedure is scheduled on  12/27/2020.   Report to [facility] at  1000 A.M.   Call this number if you have problems the morning of surgery:  [phone]   Remember:  Follow the diet and prep instructions given to you by the office.  Use your nebulizer and your inhalers before you come and bring your rescue inhaler with you.    Take these medicines the morning of surgery with A SIP OF WATER           amlodipine, clonazepam, lexapro, gabapentin, hydrocodone (if needed).     Do not wear jewelry, make-up or nail polish.  Do not wear lotions, powders, or perfumes, or deodorant.  Do not shave 48 hours prior to surgery.  Men may shave face and neck.  Do not bring valuables to the hospital.  [facility] is not responsible for any belongings or valuables.  Contacts, dentures or bridgework may not be worn into surgery.  Leave your suitcase in the car.  After surgery it may be brought to your room.  For patients admitted to the hospital, discharge time will be determined by your treatment team.  Patients discharged the day of surgery will not be allowed to drive home and must have someone with them for 24 hours.    Special instructions:   DO NOT smoke tobacco or vape for 24 hours before your procedure.  Please read over the following fact sheets that you were given. Anesthesia Post-op Instructions and Care and Recovery After Surgery      Upper Endoscopy, Adult, Care After This sheet gives you information about how to care for yourself after your procedure. Your health care provider may also give you more specific instructions. If you have problems or questions, contact your health care provider. What can I expect after the procedure? After the procedure, it is common to have: A sore throat. Mild stomach pain or discomfort. Bloating. Nausea. Follow these instructions at home:  Follow  instructions from your health care provider about what to eat or drink after your procedure. Return to your normal activities as told by your health care provider. Ask your health care provider what activities are safe for you. Take over-the-counter and prescription medicines only as told by your health care provider. If you were given a sedative during the procedure, it can affect you for several hours. Do not drive or operate machinery until your health care provider says that it is safe. Keep all follow-up visits as told by your health care provider. This is important. Contact a health care provider if you have: A sore throat that lasts longer than one day. Trouble swallowing. Get help right away if: You vomit blood or your vomit looks like coffee grounds. You have: A fever. Bloody, black, or tarry stools. A severe sore throat or you cannot swallow. Difficulty breathing. Severe pain in your chest or abdomen. Summary After the procedure, it is common to have a sore throat, mild stomach discomfort, bloating, and nausea. If you were given a sedative during the procedure, it can affect you for several hours. Do not drive or operate machinery until your health care provider says that it is safe. Follow instructions from your health care provider about what to eat or drink after your procedure. Return to your normal activities as told by your health care provider. This information is not intended  to replace advice given to you by your health care provider. Make sure you discuss any questions you have with your health care provider. Document Revised: 03/21/2019 Document Reviewed: 08/23/2017 Elsevier Patient Education  2022 [place]. Esophageal Dilatation Esophageal dilatation, also called esophageal dilation, is a procedure to widen or open a blocked or narrowed part of the esophagus. The esophagus is the part of the body that moves food and liquid from the mouth to the stomach. You may  need this procedure if: You have a buildup of scar tissue in your esophagus that makes it difficult, painful, or impossible to swallow. This can be caused by gastroesophageal reflux disease (GERD). You have cancer of the esophagus. There is a problem with how food moves through your esophagus. In some cases, you may need this procedure repeated at a later time to dilate the esophagus gradually. Tell a health care provider about: Any allergies you have. All medicines you are taking, including vitamins, herbs, eye drops, creams, and over-the-counter medicines. Any problems you or family members have had with anesthetic medicines. Any blood disorders you have. Any surgeries you have had. Any medical conditions you have. Any antibiotic medicines you are required to take before dental procedures. Whether you are pregnant or may be pregnant. What are the risks? Generally, this is a safe procedure. However, problems may occur, including: Bleeding due to a tear in the lining of the esophagus. A hole, or perforation, in the esophagus. What happens before the procedure? Ask your health care provider about: Changing or stopping your regular medicines. This is especially important if you are taking diabetes medicines or blood thinners. Taking medicines such as aspirin and ibuprofen. These medicines can thin your blood. Do not take these medicines unless your health care provider tells you to take them. Taking over-the-counter medicines, vitamins, herbs, and supplements. Follow instructions from your health care provider about eating or drinking restrictions. Plan to have a responsible adult take you home from the hospital or clinic. Plan to have a responsible adult care for you for the time you are told after you leave the hospital or clinic. This is important. What happens during the procedure? You may be given a medicine to help you relax (sedative). A numbing medicine may be sprayed into the back  of your throat, or you may gargle the medicine. Your health care provider may perform the dilatation using various surgical instruments, such as: Simple dilators. This instrument is carefully placed in the esophagus to stretch it. Guided wire bougies. This involves using an endoscope to insert a wire into the esophagus. A dilator is passed over this wire to enlarge the esophagus. Then the wire is removed. Balloon dilators. An endoscope with a small balloon is inserted into the esophagus. The balloon is inflated to stretch the esophagus and open it up. The procedure may vary among health care providers and hospitals. What can I expect after the procedure? Your blood pressure, heart rate, breathing rate, and blood oxygen level will be monitored until you leave the hospital or clinic. Your throat may feel slightly sore and numb. This will get better over time. You will not be allowed to eat or drink until your throat is no longer numb. When you are able to drink, urinate, and sit on the edge of the bed without nausea or dizziness, you may be able to return home. Follow these instructions at home: Take over-the-counter and prescription medicines only as told by your health care provider. If you were  given a sedative during the procedure, it can affect you for several hours. Do not drive or operate machinery until your health care provider says that it is safe. Plan to have a responsible adult care for you for the time you are told. This is important. Follow instructions from your health care provider about any eating or drinking restrictions. Do not use any products that contain nicotine or tobacco, such as cigarettes, e-cigarettes, and chewing tobacco. If you need help quitting, ask your health care provider. Keep all follow-up visits. This is important. Contact a health care provider if: You have a fever. You have pain that is not relieved by medicine. Get help right away if: You have chest  pain. You have trouble breathing. You have trouble swallowing. You vomit blood. You have black, tarry, or bloody stools. These symptoms may represent a serious problem that is an emergency. Do not wait to see if the symptoms will go away. Get medical help right away. Call your local emergency services (911 in the [place] Do not drive yourself to the hospital. Summary Esophageal dilatation, also called esophageal dilation, is a procedure to widen or open a blocked or narrowed part of the esophagus. Plan to have a responsible adult take you home from the hospital or clinic. For this procedure, a numbing medicine may be sprayed into the back of your throat, or you may gargle the medicine. Do not drive or operate machinery until your health care provider says that it is safe. This information is not intended to replace advice given to you by your health care provider. Make sure you discuss any questions you have with your health care provider. Document Revised: 08/09/2019 Document Reviewed: 08/09/2019 Elsevier Patient Education  [place] After This sheet gives you information about how to care for yourself after your procedure. Your health care provider may also give you more specific instructions. If you have problems or questions, contact your health care provider. What can I expect after the procedure? After the procedure, it is common to have: Tiredness. Forgetfulness about what happened after the procedure. Impaired judgment for important decisions. Nausea or vomiting. Some difficulty with balance. Follow these instructions at home: For the time period you were told by your health care provider:   Rest as needed. Do not participate in activities where you could fall or become injured. Do not drive or use machinery. Do not drink alcohol. Do not take sleeping pills or medicines that cause drowsiness. Do not make important decisions or sign legal  documents. Do not take care of children on your own. Eating and drinking Follow the diet that is recommended by your health care provider. Drink enough fluid to keep your urine pale yellow. If you vomit: Drink water, juice, or soup when you can drink without vomiting. Make sure you have little or no nausea before eating solid foods. General instructions Have a responsible adult stay with you for the time you are told. It is important to have someone help care for you until you are awake and alert. Take over-the-counter and prescription medicines only as told by your health care provider. If you have sleep apnea, surgery and certain medicines can increase your risk for breathing problems. Follow instructions from your health care provider about wearing your sleep device: Anytime you are sleeping, including during daytime naps. While taking prescription pain medicines, sleeping medicines, or medicines that make you drowsy. Avoid smoking. Keep all follow-up visits as told by your  health care provider. This is important. Contact a health care provider if: You keep feeling nauseous or you keep vomiting. You feel light-headed. You are still sleepy or having trouble with balance after 24 hours. You develop a rash. You have a fever. You have redness or swelling around the IV site. Get help right away if: You have trouble breathing. You have new-onset confusion at home. Summary For several hours after your procedure, you may feel tired. You may also be forgetful and have poor judgment. Have a responsible adult stay with you for the time you are told. It is important to have someone help care for you until you are awake and alert. Rest as told. Do not drive or operate machinery. Do not drink alcohol or take sleeping pills. Get help right away if you have trouble breathing, or if you suddenly become confused. This information is not intended to replace advice given to you by your health care  provider. Make sure you discuss any questions you have with your health care provider. Document Revised: 12/07/2019 Document Reviewed: 02/23/2019 Elsevier Patient Education  2022 [employer].

## 2020-12-25 ENCOUNTER — Other Ambulatory Visit: Payer: Self-pay

## 2020-12-25 ENCOUNTER — Encounter (HOSPITAL_COMMUNITY)
Admission: RE | Admit: 2020-12-25 | Discharge: 2020-12-25 | Disposition: A | Discharge status: Home / Self Care | Payer: Medicare Other | Source: Ambulatory Visit | Attending: Internal Medicine | Admitting: Internal Medicine

## 2020-12-27 ENCOUNTER — Ambulatory Visit (HOSPITAL_COMMUNITY)
Admission: RE | Admit: 2020-12-27 | Discharge: 2020-12-27 | Disposition: A | Discharge status: Home / Self Care | Payer: Medicare Other | Attending: Internal Medicine | Admitting: Internal Medicine

## 2020-12-27 ENCOUNTER — Ambulatory Visit (HOSPITAL_COMMUNITY): Payer: Medicare Other | Admitting: Anesthesiology

## 2020-12-27 ENCOUNTER — Encounter (HOSPITAL_COMMUNITY)
Admission: RE | Disposition: A | Discharge status: Home / Self Care | Payer: Self-pay | Source: Home / Self Care | Attending: Internal Medicine

## 2020-12-27 ENCOUNTER — Encounter (HOSPITAL_COMMUNITY): Payer: Self-pay | Admitting: Internal Medicine

## 2020-12-27 DIAGNOSIS — I110 Hypertensive heart disease with heart failure: Secondary | ICD-10-CM | POA: Insufficient documentation

## 2020-12-27 DIAGNOSIS — E079 Disorder of thyroid, unspecified: Secondary | ICD-10-CM | POA: Insufficient documentation

## 2020-12-27 DIAGNOSIS — I509 Heart failure, unspecified: Secondary | ICD-10-CM | POA: Insufficient documentation

## 2020-12-27 DIAGNOSIS — I272 Pulmonary hypertension, unspecified: Secondary | ICD-10-CM | POA: Diagnosis not present

## 2020-12-27 DIAGNOSIS — Z90710 Acquired absence of both cervix and uterus: Secondary | ICD-10-CM | POA: Diagnosis not present

## 2020-12-27 DIAGNOSIS — Z803 Family history of malignant neoplasm of breast: Secondary | ICD-10-CM | POA: Insufficient documentation

## 2020-12-27 DIAGNOSIS — J449 Chronic obstructive pulmonary disease, unspecified: Secondary | ICD-10-CM | POA: Diagnosis not present

## 2020-12-27 DIAGNOSIS — Z888 Allergy status to other drugs, medicaments and biological substances status: Secondary | ICD-10-CM | POA: Insufficient documentation

## 2020-12-27 DIAGNOSIS — K219 Gastro-esophageal reflux disease without esophagitis: Secondary | ICD-10-CM | POA: Insufficient documentation

## 2020-12-27 DIAGNOSIS — R1310 Dysphagia, unspecified: Secondary | ICD-10-CM | POA: Diagnosis not present

## 2020-12-27 DIAGNOSIS — Z800 Family history of malignant neoplasm of digestive organs: Secondary | ICD-10-CM | POA: Diagnosis not present

## 2020-12-27 DIAGNOSIS — Z79899 Other long term (current) drug therapy: Secondary | ICD-10-CM | POA: Insufficient documentation

## 2020-12-27 DIAGNOSIS — Z809 Family history of malignant neoplasm, unspecified: Secondary | ICD-10-CM | POA: Insufficient documentation

## 2020-12-27 DIAGNOSIS — G629 Polyneuropathy, unspecified: Secondary | ICD-10-CM | POA: Diagnosis not present

## 2020-12-27 DIAGNOSIS — Z808 Family history of malignant neoplasm of other organs or systems: Secondary | ICD-10-CM | POA: Insufficient documentation

## 2020-12-27 DIAGNOSIS — R1314 Dysphagia, pharyngoesophageal phase: Secondary | ICD-10-CM | POA: Insufficient documentation

## 2020-12-27 DIAGNOSIS — Z885 Allergy status to narcotic agent status: Secondary | ICD-10-CM | POA: Insufficient documentation

## 2020-12-27 DIAGNOSIS — K31A19 Gastric intestinal metaplasia without dysplasia, unspecified site: Secondary | ICD-10-CM | POA: Diagnosis not present

## 2020-12-27 DIAGNOSIS — F17210 Nicotine dependence, cigarettes, uncomplicated: Secondary | ICD-10-CM | POA: Diagnosis not present

## 2020-12-27 DIAGNOSIS — Z9049 Acquired absence of other specified parts of digestive tract: Secondary | ICD-10-CM | POA: Diagnosis not present

## 2020-12-27 HISTORY — PX: BIOPSY: SHX5522

## 2020-12-27 HISTORY — PX: MALONEY DILATION: SHX5535

## 2020-12-27 HISTORY — PX: ESOPHAGOGASTRODUODENOSCOPY (EGD) WITH PROPOFOL: SHX5813

## 2020-12-27 SURGERY — ESOPHAGOGASTRODUODENOSCOPY (EGD) WITH PROPOFOL
Anesthesia: General

## 2020-12-27 MED ORDER — LIDOCAINE HCL 1 % IJ SOLN
INTRAMUSCULAR | Status: DC | PRN
  Administered 2020-12-27: 50 mg via INTRADERMAL

## 2020-12-27 MED ORDER — LACTATED RINGERS IV SOLN: INTRAVENOUS | Status: DC

## 2020-12-27 MED ORDER — MIDAZOLAM HCL 5 MG/5ML IJ SOLN: INTRAMUSCULAR | Status: DC | PRN

## 2020-12-27 MED ORDER — PROPOFOL 10 MG/ML IV BOLUS
INTRAVENOUS | Status: AC
  Filled 2020-12-27: qty 60

## 2020-12-27 MED ORDER — PROPOFOL 10 MG/ML IV BOLUS
INTRAVENOUS | Status: DC | PRN
  Administered 2020-12-27: 50 mg via INTRAVENOUS
  Administered 2020-12-27: 100 mg via INTRAVENOUS

## 2020-12-27 MED ORDER — STERILE WATER FOR IRRIGATION IR SOLN
Status: DC | PRN
  Administered 2020-12-27: 100 mL

## 2020-12-27 NOTE — Op Note (Signed)
[facility] Patient Name: [name] Procedure Date: 12/27/2020 11:11 AM MRN: [id number] Date of Birth: February 16, 1946 Attending MD: Norvel Richards , MD CSN: [id number] Age: 75 Admit Type: Outpatient Procedure:                Upper GI endoscopy Indications:              Dysphagia Providers:                Norvel Richards, MD, Crystal Page, Aram Candela Referring MD:              Medicines:                Propofol per Anesthesia Complications:            No immediate complications. Estimated Blood Loss:     Estimated blood loss was minimal. Procedure:                Pre-Anesthesia Assessment:                           - Prior to the procedure, a History and Physical                            was performed, and patient medications and                            allergies were reviewed. The patient's tolerance of                            previous anesthesia was also reviewed. The risks                            and benefits of the procedure and the sedation                            options and risks were discussed with the patient.                            All questions were answered, and informed consent                            was obtained. Prior Anticoagulants: The patient has                            taken no previous anticoagulant or antiplatelet                            agents. ASA Grade Assessment: II - A patient with                            mild systemic disease. After reviewing the risks                            and benefits, the patient was deemed in  satisfactory condition to undergo the procedure.                           After obtaining informed consent, the endoscope was                            passed under direct vision. Throughout the                            procedure, the patient's blood pressure, pulse, and                            oxygen saturations were monitored continuously. The                             GIF-H190 ([id number]) scope was introduced through the                            mouth, and advanced to the second part of duodenum.                            The upper GI endoscopy was accomplished without                            difficulty. The patient tolerated the procedure                            well. Scope In: 11:42:06 AM Scope Out: 11:49:22 AM Total Procedure Duration: 0 hours 7 minutes 16 seconds  Findings:      The examined esophagus was normal. 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 [name] 1 configuration. Minimally       erythematous residual gastric mucosa. Patent anastomosis. Approximately       1.2 sonometer nodule just on the small bowel side of the anastomosi As       seen similarly the E variant small bowel otherwise appeared normal. The       scope was withdrawn. Dilation was performed with a Maloney dilator with       mild resistance at 53 Fr. The scope was withdrawn. Dilation was       performed with a Maloney dilator with mild resistance at 56 Fr. The       dilation site was examined following endoscope reinsertion and showed no       change. Estimated blood loss was minimal.      Finally, the nodule was biopsied. Impression:               - Normal esophagus. Dilated.                           - Prior hemigastrectomy with Billroth I                            configuration. Anastomotic nodule biopsied Moderate Sedation:      Moderate (conscious) sedation was personally administered by an       anesthesia professional. The following  parameters were monitored: oxygen       saturation, heart rate, blood pressure, and response to care. Recommendation:           - Patient has a contact number available for                            emergencies. The signs and symptoms of potential                            delayed complications were discussed with the                            patient. Return to normal  activities tomorrow.                            Written discharge instructions were provided to the                            patient.                           - Advance diet as tolerated. follow-up on                            pathology. Continue Protonix 40 mg daily. Office                            visit with [place] in 3 months Procedure Code(s):        --- Professional ---                           [id number], Esophagogastroduodenoscopy, flexible,                            transoral; diagnostic, including collection of                            specimen(s) by brushing or washing, when performed                            (separate procedure)                           43450, Dilation of esophagus, by unguided sound or                            bougie, single or multiple passes Diagnosis Code(s):        --- Professional ---                           R13.10, Dysphagia, unspecified CPT copyright 2019 American Medical Association. All rights reserved. The codes documented in this report are preliminary and upon coder review may  be revised to meet current compliance requirements. [name]. Lilliana Turner, MD Norvel Richards, MD 12/27/2020 12:03:52 PM This report has been signed electronically. Number of Addenda: 0

## 2020-12-27 NOTE — Anesthesia Postprocedure Evaluation (Signed)
Anesthesia Post Note  Patient: [name]  Procedure(s) Performed: ESOPHAGOGASTRODUODENOSCOPY (EGD) WITH PROPOFOL [place]  Patient location during evaluation: Short Stay Anesthesia Type: General Level of consciousness: awake Pain management: pain level controlled Vital Signs Assessment: post-procedure vital signs reviewed and stable Respiratory status: spontaneous breathing Cardiovascular status: blood pressure returned to baseline Postop Assessment: no apparent nausea or vomiting Anesthetic complications: no   No notable events documented.   Last Vitals:  Vitals:   12/27/20 1015 12/27/20 1019  BP: 93/72   Pulse: 89 82  Resp: 15 16  Temp:    SpO2: (!) 87% 96%    Last Pain:  Vitals:   12/27/20 1138  PainSc: 0-No pain                 Case Vassell

## 2020-12-27 NOTE — Transfer of Care (Signed)
Immediate Anesthesia Transfer of Care Note  Patient: [name]  Procedure(s) Performed: ESOPHAGOGASTRODUODENOSCOPY (EGD) WITH PROPOFOL MALONEY DILATION BIOPSY  Patient Location: Short Stay  Anesthesia Type:General  Level of Consciousness: awake  Airway & Oxygen Therapy: Patient Spontanous Breathing  Post-op Assessment: Report given to RN  Post vital signs: Reviewed  Last Vitals:  Vitals Value Taken Time  BP    Temp    Pulse    Resp    SpO2      Last Pain:  Vitals:   12/27/20 1138  PainSc: 0-No pain         Complications: No notable events documented.

## 2020-12-27 NOTE — Discharge Instructions (Addendum)
EGD Discharge instructions Please read the instructions outlined below and refer to this sheet in the next few weeks. These discharge instructions provide you with general information on caring for yourself after you leave the hospital. Your doctor may also give you specific instructions. While your treatment has been planned according to the most current medical practices available, unavoidable complications occasionally occur. If you have any problems or questions after discharge, please call your doctor. ACTIVITY You may resume your regular activity but move at a slower pace for the next 24 hours.  Take frequent rest periods for the next 24 hours.  Walking will help expel (get rid of) the air and reduce the bloated feeling in your abdomen.  No driving for 24 hours (because of the anesthesia (medicine) used during the test).  You may shower.  Do not sign any important legal documents or operate any machinery for 24 hours (because of the anesthesia used during the test).  NUTRITION Drink plenty of fluids.  You may resume your normal diet.  Begin with a light meal and progress to your normal diet.  Avoid alcoholic beverages for 24 hours or as instructed by your caregiver.  MEDICATIONS You may resume your normal medications unless your caregiver tells you otherwise.  WHAT YOU CAN EXPECT TODAY You may experience abdominal discomfort such as a feeling of fullness or "gas" pains.  FOLLOW-UP Your doctor will discuss the results of your test with you.  SEEK IMMEDIATE MEDICAL ATTENTION IF ANY OF THE FOLLOWING OCCUR: Excessive nausea (feeling sick to your stomach) and/or vomiting.  Severe abdominal pain and distention (swelling).  Trouble swallowing.  Temperature over 101 F (37.8 C).  Rectal bleeding or vomiting of blood.      Your esophagus was stretched today   the nodule in your stomach was biopsied   continue Protonix 40 mg daily   office visit with [place] in 3 months   further  recommendations to follow pending review of pathology report   at patient request I called [facility] at [phone] -  reviewed information

## 2020-12-27 NOTE — H&P (Signed)
_0 @   Primary Care Physician:  Lemmie Evens, MD Primary Gastroenterologist:  Dr. Gala Romney  Pre-Procedure History & Physical: HPI:  [name] is a 75 y.o. female here for Further evaluation esophageal dysphagia.  History of intestinal metaplasia on biopsy of mucosa at anastomosis (Billroth I) previously.  Past Medical History:  Diagnosis Date   Arthritis    CHF (congestive heart failure) (HCC)    Chronic back pain    COPD (chronic obstructive pulmonary disease) (HCC)    patient is on oxygen at night when needed   Depression    GERD (gastroesophageal reflux disease)    Heart failure (HCC)    RV   Hypertension    Neuropathy    Pulmonary HTN (HCC)    Thyroid disease    Ulcer    Billroth I    Past Surgical History:  Procedure Laterality Date   ABDOMINAL HYSTERECTOMY     BACK SURGERY     X4   BILROTH I PROCEDURE     BIOPSY  12/18/2019   Procedure: BIOPSY;  Surgeon: Daneil Dolin, MD;  [facility]: [facility];  Service: Endoscopy;;   CHOLECYSTECTOMY     COLONOSCOPY   05/18/2003   TKW:[id number] colonoscopy/ Internal hemorrhoids.  Otherwise, normal rectum   COLONOSCOPY  08/12/2010   Dr. Arnoldo Morale: cecum visualized and normal, colon and rectum normal. Torturous colon   COLONOSCOPY N/A 09/07/2012   RMR: tubular adenoma, lipoma. Due for surveillance in 2021   COLONOSCOPY WITH PROPOFOL N/A 12/18/2019   Mirjana Tarleton: Three 2-60m polyps removed from the descending colon, tubular adenomas.  Diverticulosis.  Noncompliant left colon.   ESOPHAGOGASTRODUODENOSCOPY  05/18/2003   RMR:. Normal esophagus/Adenomatous-appearing mucosa at the anastomosis   ESOPHAGOGASTRODUODENOSCOPY  08/12/2010   Dr. JArnoldo Morale anastomosis widely patent, no ulcerations, CLO test negative   ESOPHAGOGASTRODUODENOSCOPY (EGD) WITH PROPOFOL N/A 12/18/2019   Raphael Fitzpatrick: Normal esophagus status post dilation.  Prior hemigastrectomy.  Small bowel nodule of uncertain significance.  Biopsy with mild reactive changes and  focal intestinal metaplasia.  Recommend 1 year follow-up EGD.   MALONEY DILATION N/A 12/18/2019   Procedure: MVenia MinksDILATION;  Surgeon: RDaneil Dolin MD;  Location: [facility];  Service: Endoscopy;  Laterality: N/A;   POLYPECTOMY  12/18/2019   Procedure: POLYPECTOMY;  Surgeon: RDaneil Dolin MD;  [facility];  Service: Endoscopy;;   RIGHT HEART CATH N/A 05/04/2018   Procedure: RIGHT HEART CATH;  Surgeon: MLarey Dresser MD;  [facility];  Service: Cardiovascular;  Laterality: N/A;   STOMACH SURGERY     removed partial stomach   YAG LASER APPLICATION Left 124/26/8341  Procedure: YAG LASER APPLICATION;  Surgeon: MRutherford Guys MD;  Location: [facility];  Service: Ophthalmology;  Laterality: Left;   YAG LASER APPLICATION Right 196/22/2979  Procedure: YAG LASER APPLICATION;  Surgeon: MRutherford Guys MD;  Location: [facility];  Service: Ophthalmology;  Laterality: Right;    Prior to Admission medications   Medication Sig Start Date End Date Taking? Authorizing Provider  albuterol (PROVENTIL) (2.5 MG/3ML) 0.083% nebulizer solution Take 2.5 mg by nebulization in the morning and at bedtime. 08/16/20  Yes [provider]  amLODipine (NORVASC) 10 MG tablet Take 1 tablet (10 mg total) by mouth daily. 08/08/20  Yes MLarey Dresser MD  clonazePAM (KLONOPIN) 1 MG tablet Take 0.5-1 mg by mouth 4 (four) times daily as needed for anxiety.   Yes [provider]  COMBIVENT RESPIMAT 20-100 MCG/ACT AERS respimat Inhale 1  puff into the lungs in the morning and at bedtime. 08/16/20  Yes [provider]  dapagliflozin propanediol (FARXIGA) 10 MG TABS tablet Take 1 tablet (10 mg total) by mouth daily before breakfast. 08/08/20  Yes Larey Dresser, MD  escitalopram (LEXAPRO) 10 MG tablet Take 10 mg by mouth at bedtime. 07/15/18  Yes [provider]  furosemide (LASIX) 40 MG tablet Take 1 tablet (40 mg total) by mouth daily. 08/08/20  Yes Larey Dresser, MD   gabapentin (NEURONTIN) 300 MG capsule Take 300 mg by mouth 3 (three) times daily.   Yes [provider]  HYDROcodone-acetaminophen (NORCO) 10-325 MG tablet Take 1 tablet by mouth every 4 (four) hours as needed (severe back pain.). 01/22/17  Yes [provider]  pantoprazole (PROTONIX) 40 MG tablet Take 40 mg by mouth at bedtime. 08/24/12  Yes [provider]  potassium chloride (KLOR-CON) 10 MEQ tablet TAKE (1) TABLET BY MOUTH TWICE DAILY. 08/08/20  Yes Larey Dresser, MD    Allergies as of 11/21/2020 - Review Complete 11/21/2020  Allergen Reaction Noted   Chantix [varenicline] Nausea And Vomiting 12/01/2016   Codeine Other (See Comments) 12/01/2016   Oxycodone Palpitations 01/11/2017    Family History  Problem Relation Age of Onset   Cancer Sister        breast cancer   Cancer Brother 53       esophageal cancer   Cancer Maternal Aunt        pancreas?   Cancer Paternal Aunt        multiple aunts had cancer   Colon cancer Neg Hx     Social History   Socioeconomic History   Marital status: Married    Spouse name: Not on file   Number of children: Not on file   Years of education: Not on file   Highest education level: Not on file  Occupational History   Not on file  Tobacco Use   Smoking status: Every Day    Packs/day: 0.30    Years: 15.00    Pack years: 4.50    Types: Cigarettes   Smokeless tobacco: Never  Vaping Use   Vaping Use: Never used  Substance and Sexual Activity   Alcohol use: No   Drug use: No   Sexual activity: Not Currently  Other Topics Concern   Not on file  Social History Narrative   Not on file   Social Determinants of Health   Financial Resource Strain: Not on file  Food Insecurity: Not on file  Transportation Needs: Not on file  Physical Activity: Not on file  Stress: Not on file  Social Connections: Not on file  Intimate Partner Violence: Not on file    Review of Systems: See HPI, otherwise negative  ROS  Physical Exam: BP 93/72 (BP Location: Right Arm)   Pulse 82   Temp 98.1 F (36.7 C)   Resp 16   SpO2 96%  General:   Alert,  Well-developed, well-nourished, pleasant and cooperative in NAD Neck:  Supple; no masses or thyromegaly. No significant cervical adenopathy. Lungs:  Clear throughout to auscultation.   No wheezes, crackles, or rhonchi. No acute distress. Heart:  Regular rate and rhythm; no murmurs, clicks, rubs,  or gallops. Abdomen: Non-distended, normal bowel sounds.  Soft and nontender without appreciable mass or hepatosplenomegaly.  Pulses:  Normal pulses noted. Extremities:  Without clubbing or edema.  Impression/Plan:  75 year old  lady with recurrent esophageal dysphagia.  Limited intestinal metaplasia and  nodule at Billroth I anastomosis previously.  She is here for repeat EGD and dilation, etc. per plan.  The risks, benefits, limitations, alternatives and imponderables have been reviewed with the patient. Potential for esophageal dilation, biopsy, etc. have also been reviewed.  Questions have been answered. All parties agreeable.      Notice: This dictation was prepared with Dragon dictation along with smaller phrase technology. Any transcriptional errors that result from this process are unintentional and may not be corrected upon review.

## 2020-12-27 NOTE — Anesthesia Preprocedure Evaluation (Signed)
Anesthesia Evaluation  Patient identified by MRN, date of birth, ID band Patient awake    Reviewed: Allergy & Precautions, H&P , NPO status , Patient's Chart, lab work & pertinent test results, reviewed documented beta blocker date and time   Airway Mallampati: II  TM Distance: >3 FB Neck ROM: full    Dental no notable dental hx.    Pulmonary COPD,  oxygen dependent, Current Smoker and Patient abstained from smoking.,    Pulmonary exam normal breath sounds clear to auscultation       Cardiovascular Exercise Tolerance: Good hypertension, negative cardio ROS   Rhythm:regular Rate:Normal     Neuro/Psych PSYCHIATRIC DISORDERS Depression negative neurological ROS     GI/Hepatic Neg liver ROS, GERD  Medicated,  Endo/Other  negative endocrine ROS  Renal/GU negative Renal ROS  negative genitourinary   Musculoskeletal   Abdominal   Peds  Hematology negative hematology ROS (+)   Anesthesia Other Findings 1. Left ventricular ejection fraction, by estimation, is 60 to 65%. The  left ventricle has normal function. The left ventricle has no regional  wall motion abnormalities. Left ventricular diastolic parameters are  consistent with Grade I diastolic  dysfunction (impaired relaxation).  2. Right ventricular systolic function is normal. The right ventricular  size is normal. There is moderately elevated pulmonary artery systolic  pressure. The estimated right ventricular systolic pressure is 63.4 mmHg.  3. The mitral valve is normal in structure. Trivial mitral valve  regurgitation. No evidence of mitral stenosis.  4. The aortic valve is tricuspid. Aortic valve regurgitation is not  visualized. No aortic stenosis is present.  5. The inferior vena cava is normal in size with greater than 50%  respiratory variability, suggesting right atrial pressure of 3 mmHg.   Reproductive/Obstetrics negative OB ROS                              Anesthesia Physical Anesthesia Plan  ASA: 3  Anesthesia Plan: General   Post-op Pain Management:    Induction:   PONV Risk Score and Plan: Propofol infusion  Airway Management Planned:   Additional Equipment:   Intra-op Plan:   Post-operative Plan:   Informed Consent: I have reviewed the patients History and Physical, chart, labs and discussed the procedure including the risks, benefits and alternatives for the proposed anesthesia with the patient or authorized representative who has indicated his/her understanding and acceptance.     Dental Advisory Given  Plan Discussed with: CRNA  Anesthesia Plan Comments:         Anesthesia Quick Evaluation

## 2020-12-30 ENCOUNTER — Encounter: Payer: Self-pay | Admitting: Internal Medicine

## 2020-12-31 LAB — SURGICAL PATHOLOGY

## 2021-01-01 ENCOUNTER — Encounter: Payer: Self-pay | Admitting: Internal Medicine

## 2021-01-02 ENCOUNTER — Encounter (HOSPITAL_COMMUNITY): Payer: Self-pay | Admitting: Internal Medicine

## 2021-01-07 ENCOUNTER — Other Ambulatory Visit: Payer: Self-pay | Admitting: Nurse Practitioner

## 2021-01-07 DIAGNOSIS — G8929 Other chronic pain: Secondary | ICD-10-CM

## 2021-01-07 DIAGNOSIS — M545 Low back pain, unspecified: Secondary | ICD-10-CM

## 2021-01-16 ENCOUNTER — Other Ambulatory Visit: Payer: Self-pay

## 2021-01-16 ENCOUNTER — Other Ambulatory Visit: Payer: Self-pay | Admitting: Nurse Practitioner

## 2021-01-16 ENCOUNTER — Ambulatory Visit
Admission: RE | Admit: 2021-01-16 | Discharge: 2021-01-16 | Disposition: A | Discharge status: Home / Self Care | Payer: Medicare Other | Source: Ambulatory Visit | Attending: Nurse Practitioner | Admitting: Nurse Practitioner

## 2021-01-16 DIAGNOSIS — G8929 Other chronic pain: Secondary | ICD-10-CM

## 2021-01-16 DIAGNOSIS — M545 Low back pain, unspecified: Secondary | ICD-10-CM

## 2021-01-16 MED ORDER — METHYLPREDNISOLONE ACETATE 40 MG/ML INJ SUSP (RADIOLOG
80.0000 mg | Freq: Once | INTRAMUSCULAR | Status: AC
  Administered 2021-01-16: 80 mg via EPIDURAL

## 2021-01-16 MED ORDER — IOPAMIDOL (ISOVUE-M 200) INJECTION 41%
1.0000 mL | Freq: Once | INTRAMUSCULAR | Status: AC
  Administered 2021-01-16: 1 mL via EPIDURAL

## 2021-01-16 NOTE — Discharge Instructions (Signed)

## 2021-04-15 ENCOUNTER — Other Ambulatory Visit: Payer: Self-pay | Admitting: Family Medicine

## 2021-04-15 DIAGNOSIS — M545 Low back pain, unspecified: Secondary | ICD-10-CM

## 2021-04-15 DIAGNOSIS — G8929 Other chronic pain: Secondary | ICD-10-CM

## 2021-04-16 ENCOUNTER — Ambulatory Visit
Admission: RE | Admit: 2021-04-16 | Discharge: 2021-04-16 | Disposition: A | Discharge status: Home / Self Care | Payer: Medicare Other | Source: Ambulatory Visit | Attending: Family Medicine | Admitting: Family Medicine

## 2021-04-16 DIAGNOSIS — G8929 Other chronic pain: Secondary | ICD-10-CM

## 2021-04-16 DIAGNOSIS — M545 Low back pain, unspecified: Secondary | ICD-10-CM

## 2021-04-16 MED ORDER — METHYLPREDNISOLONE ACETATE 40 MG/ML INJ SUSP (RADIOLOG
80.0000 mg | Freq: Once | INTRAMUSCULAR | Status: AC
  Administered 2021-04-16: 80 mg via EPIDURAL

## 2021-04-16 MED ORDER — IOPAMIDOL (ISOVUE-M 200) INJECTION 41%
1.0000 mL | Freq: Once | INTRAMUSCULAR | Status: AC
  Administered 2021-04-16: 1 mL via EPIDURAL

## 2021-04-16 NOTE — Discharge Instructions (Signed)

## 2021-04-28 ENCOUNTER — Other Ambulatory Visit (HOSPITAL_COMMUNITY): Payer: Self-pay | Admitting: Family Medicine

## 2021-04-28 DIAGNOSIS — Z1231 Encounter for screening mammogram for malignant neoplasm of breast: Secondary | ICD-10-CM

## 2021-05-08 ENCOUNTER — Other Ambulatory Visit (HOSPITAL_COMMUNITY): Payer: Self-pay | Admitting: Family Medicine

## 2021-05-08 DIAGNOSIS — F17200 Nicotine dependence, unspecified, uncomplicated: Secondary | ICD-10-CM

## 2021-05-09 ENCOUNTER — Other Ambulatory Visit: Payer: Self-pay

## 2021-05-09 ENCOUNTER — Ambulatory Visit (HOSPITAL_COMMUNITY)
Admission: RE | Admit: 2021-05-09 | Discharge: 2021-05-09 | Disposition: A | Discharge status: Home / Self Care | Payer: Medicare Other | Source: Ambulatory Visit | Attending: Family Medicine | Admitting: Family Medicine

## 2021-05-09 DIAGNOSIS — Z1231 Encounter for screening mammogram for malignant neoplasm of breast: Secondary | ICD-10-CM | POA: Insufficient documentation

## 2021-05-13 ENCOUNTER — Encounter: Payer: Self-pay | Admitting: Gastroenterology

## 2021-05-13 NOTE — Progress Notes (Deleted)
Primary Care Physician: Lemmie Evens, MD  Primary Gastroenterologist:  Garfield Cornea, MD   No chief complaint on file.   HPI: [name] is a 76 y.o. female here for follow up. Last seen in 10/2020.   Completed EGD and colonoscopy in September 2021. She had a normal esophagus, dilated due to history of dysphagia.  Evidence of prior hemigastrectomy.  Small bowel nodule at the anastomosis, biopsied with mild reactive changes and focal intestinal metaplasia.  Repeat EGD recommended in 1 year.  On colonoscopy she had couple of tubular adenomas removed, noncompliant left colon, diverticulosis.  EGD 12/2020:  Normal esophagus, s/p dilation S/p hemigastrectomy, Billroth I configuration Anastomotic nodule biopsied, polypoid duodenal mucosa with surface gastric foveolar metaplasia, s/p peptic injury, no malignancy  Current Outpatient Medications  Medication Sig Dispense Refill   albuterol (PROVENTIL) (2.5 MG/3ML) 0.083% nebulizer solution Take 2.5 mg by nebulization in the morning and at bedtime.     amLODipine (NORVASC) 10 MG tablet Take 1 tablet (10 mg total) by mouth daily. 30 tablet 11   clonazePAM (KLONOPIN) 1 MG tablet Take 0.5-1 mg by mouth 4 (four) times daily as needed for anxiety.     COMBIVENT RESPIMAT 20-100 MCG/ACT AERS respimat Inhale 1 puff into the lungs in the morning and at bedtime.     dapagliflozin propanediol (FARXIGA) 10 MG TABS tablet Take 1 tablet (10 mg total) by mouth daily before breakfast. 30 tablet 11   escitalopram (LEXAPRO) 10 MG tablet Take 10 mg by mouth at bedtime.     furosemide (LASIX) 40 MG tablet Take 1 tablet (40 mg total) by mouth daily. 30 tablet 11   gabapentin (NEURONTIN) 300 MG capsule Take 300 mg by mouth 3 (three) times daily.     HYDROcodone-acetaminophen (NORCO) 10-325 MG tablet Take 1 tablet by mouth every 4 (four) hours as needed (severe back pain.).     pantoprazole (PROTONIX) 40 MG tablet Take 40 mg by mouth at bedtime.      potassium chloride (KLOR-CON) 10 MEQ tablet TAKE (1) TABLET BY MOUTH TWICE DAILY. 60 tablet 11   No current facility-administered medications for this visit.    Allergies as of 05/14/2021 - Review Complete 12/27/2020  Allergen Reaction Noted   Chantix [varenicline] Nausea And Vomiting 12/01/2016   Codeine Other (See Comments) 12/01/2016   Oxycodone Palpitations 01/11/2017    ROS:  General: Negative for anorexia, weight loss, fever, chills, fatigue, weakness. ENT: Negative for hoarseness, difficulty swallowing , nasal congestion. CV: Negative for chest pain, angina, palpitations, dyspnea on exertion, peripheral edema.  Respiratory: Negative for dyspnea at rest, dyspnea on exertion, cough, sputum, wheezing.  GI: See history of present illness. GU:  Negative for dysuria, hematuria, urinary incontinence, urinary frequency, nocturnal urination.  Endo: Negative for unusual weight change.    Physical Examination:   There were no vitals taken for this visit.  General: Well-nourished, well-developed in no acute distress.  Eyes: No icterus. Mouth: Oropharyngeal mucosa moist and pink , no lesions erythema or exudate. Lungs: Clear to auscultation bilaterally.  Heart: Regular rate and rhythm, no murmurs rubs or gallops.  Abdomen: Bowel sounds are normal, nontender, nondistended, no hepatosplenomegaly or masses, no abdominal bruits or hernia , no rebound or guarding.   Extremities: No lower extremity edema. No clubbing or deformities. Neuro: Alert and oriented x 4   Skin: Warm and dry, no jaundice.   Psych: Alert and cooperative, normal mood and affect.  Labs:  Lab Results  Component  Value Date   CREATININE 0.73 12/18/2020   BUN 10 12/18/2020   NA 135 12/18/2020   K 4.2 12/18/2020   CL 98 12/18/2020   CO2 28 12/18/2020     Imaging Studies: MM 3D SCREEN BREAST BILATERAL  Result Date: 05/09/2021 CLINICAL DATA:  Screening. EXAM: DIGITAL SCREENING BILATERAL MAMMOGRAM WITH  TOMOSYNTHESIS AND CAD TECHNIQUE: Bilateral screening digital craniocaudal and mediolateral oblique mammograms were obtained. Bilateral screening digital breast tomosynthesis was performed. The images were evaluated with computer-aided detection. COMPARISON:  Previous exam(s). ACR Breast Density Category b: There are scattered areas of fibroglandular density. FINDINGS: There are no findings suspicious for malignancy. IMPRESSION: No mammographic evidence of malignancy. A result letter of this screening mammogram will be mailed directly to the patient. RECOMMENDATION: Screening mammogram in one year. (Code:SM-B-01Y) BI-RADS CATEGORY  1: Negative. Electronically Signed   By: Dorise Bullion III M.D.   On: 05/09/2021 13:32   DG INJECT DIAG/THERA/INC NEEDLE/CATH/PLC EPI/LUMB/SAC W/IMG  Result Date: 04/16/2021 CLINICAL DATA:  Right lower extremity radiculopathy. Chronic low back pain. Patient is well with previous injection. Pain has recurred. EXAM: EXAM Right L3 NERVE ROOT BLOCK WITH TRANSFORAMINAL EPIDURAL STEROID INJECTION TECHNIQUE: Overlying skin prepped with Betadine, draped in the usual sterile fashion. Curved 22 gauge spinal needle directed into the right L3/4 neuroforamen. Diagnostic injection with 58m Isovue-M 200 contrast demonstrates good epidural spread partially outlining the right L3 nerve root. No intravascular uptake. 80 mg Depo-Medrol and 1 ml 1% lidocaine were subsequently administered. No immediate complication. The patient tolerated the procedure without difficulty. They were transferred to recovery in excellent condition. FLUOROSCOPY TIME:  Radiation Exposure Index (as provided by the fluoroscopic device): Dose area product 29.96 uGy*m2 IMPRESSION: Technically successful right L3 noneselective nerve root block with transforaminal epidural steroid injection. #2 Electronically Signed   By: CSan MorelleM.D.   On: 04/16/2021 09:22     Assessment:     Plan:

## 2021-05-14 ENCOUNTER — Encounter: Payer: Self-pay | Admitting: Internal Medicine

## 2021-05-14 ENCOUNTER — Ambulatory Visit: Payer: Medicare Other | Admitting: Gastroenterology

## 2021-05-14 ENCOUNTER — Other Ambulatory Visit: Payer: Self-pay

## 2021-05-14 ENCOUNTER — Ambulatory Visit (HOSPITAL_COMMUNITY)
Admission: RE | Admit: 2021-05-14 | Discharge: 2021-05-14 | Disposition: A | Discharge status: Home / Self Care | Payer: Medicare Other | Source: Ambulatory Visit | Attending: Family Medicine | Admitting: Family Medicine

## 2021-05-14 DIAGNOSIS — I361 Nonrheumatic tricuspid (valve) insufficiency: Secondary | ICD-10-CM | POA: Diagnosis not present

## 2021-05-14 DIAGNOSIS — F17200 Nicotine dependence, unspecified, uncomplicated: Secondary | ICD-10-CM | POA: Diagnosis not present

## 2021-05-14 DIAGNOSIS — K219 Gastro-esophageal reflux disease without esophagitis: Secondary | ICD-10-CM | POA: Insufficient documentation

## 2021-05-14 DIAGNOSIS — I10 Essential (primary) hypertension: Secondary | ICD-10-CM | POA: Diagnosis not present

## 2021-05-14 DIAGNOSIS — J449 Chronic obstructive pulmonary disease, unspecified: Secondary | ICD-10-CM | POA: Diagnosis not present

## 2021-05-14 DIAGNOSIS — I3139 Other pericardial effusion (noninflammatory): Secondary | ICD-10-CM | POA: Diagnosis not present

## 2021-05-14 DIAGNOSIS — I272 Pulmonary hypertension, unspecified: Secondary | ICD-10-CM | POA: Insufficient documentation

## 2021-05-14 LAB — ECHOCARDIOGRAM COMPLETE
Area-P 1/2: 2.29 cm2
S' Lateral: 2.3 cm

## 2021-05-14 NOTE — Progress Notes (Signed)
*  PRELIMINARY RESULTS* Echocardiogram 2D Echocardiogram has been performed.  [name] 05/14/2021, 1:49 PM

## 2021-06-05 ENCOUNTER — Other Ambulatory Visit: Payer: Self-pay

## 2021-06-05 ENCOUNTER — Inpatient Hospital Stay (HOSPITAL_COMMUNITY)
Admission: EM | Admit: 2021-06-05 | Discharge: 2021-06-07 | DRG: 281 | Disposition: A | Discharge status: Home / Self Care | Payer: Medicare Other | Attending: Cardiovascular Disease | Admitting: Cardiovascular Disease

## 2021-06-05 ENCOUNTER — Encounter (HOSPITAL_COMMUNITY): Payer: Self-pay

## 2021-06-05 ENCOUNTER — Emergency Department (HOSPITAL_COMMUNITY): Payer: Medicare Other

## 2021-06-05 DIAGNOSIS — Z885 Allergy status to narcotic agent status: Secondary | ICD-10-CM | POA: Diagnosis not present

## 2021-06-05 DIAGNOSIS — G629 Polyneuropathy, unspecified: Secondary | ICD-10-CM | POA: Diagnosis present

## 2021-06-05 DIAGNOSIS — Z888 Allergy status to other drugs, medicaments and biological substances status: Secondary | ICD-10-CM

## 2021-06-05 DIAGNOSIS — M549 Dorsalgia, unspecified: Secondary | ICD-10-CM | POA: Diagnosis present

## 2021-06-05 DIAGNOSIS — J449 Chronic obstructive pulmonary disease, unspecified: Secondary | ICD-10-CM

## 2021-06-05 DIAGNOSIS — D179 Benign lipomatous neoplasm, unspecified: Secondary | ICD-10-CM | POA: Diagnosis present

## 2021-06-05 DIAGNOSIS — I272 Pulmonary hypertension, unspecified: Secondary | ICD-10-CM

## 2021-06-05 DIAGNOSIS — M1990 Unspecified osteoarthritis, unspecified site: Secondary | ICD-10-CM | POA: Diagnosis present

## 2021-06-05 DIAGNOSIS — R079 Chest pain, unspecified: Secondary | ICD-10-CM | POA: Diagnosis not present

## 2021-06-05 DIAGNOSIS — Z79899 Other long term (current) drug therapy: Secondary | ICD-10-CM | POA: Diagnosis not present

## 2021-06-05 DIAGNOSIS — G8929 Other chronic pain: Secondary | ICD-10-CM | POA: Diagnosis present

## 2021-06-05 DIAGNOSIS — I2510 Atherosclerotic heart disease of native coronary artery without angina pectoris: Secondary | ICD-10-CM | POA: Diagnosis present

## 2021-06-05 DIAGNOSIS — K219 Gastro-esophageal reflux disease without esophagitis: Secondary | ICD-10-CM | POA: Diagnosis present

## 2021-06-05 DIAGNOSIS — R0789 Other chest pain: Secondary | ICD-10-CM | POA: Diagnosis present

## 2021-06-05 DIAGNOSIS — J441 Chronic obstructive pulmonary disease with (acute) exacerbation: Secondary | ICD-10-CM | POA: Diagnosis present

## 2021-06-05 DIAGNOSIS — R0902 Hypoxemia: Secondary | ICD-10-CM | POA: Diagnosis present

## 2021-06-05 DIAGNOSIS — I2729 Other secondary pulmonary hypertension: Secondary | ICD-10-CM | POA: Diagnosis present

## 2021-06-05 DIAGNOSIS — I50810 Right heart failure, unspecified: Secondary | ICD-10-CM | POA: Diagnosis present

## 2021-06-05 DIAGNOSIS — F17210 Nicotine dependence, cigarettes, uncomplicated: Secondary | ICD-10-CM | POA: Diagnosis present

## 2021-06-05 DIAGNOSIS — Z90710 Acquired absence of both cervix and uterus: Secondary | ICD-10-CM

## 2021-06-05 DIAGNOSIS — I110 Hypertensive heart disease with heart failure: Secondary | ICD-10-CM | POA: Diagnosis present

## 2021-06-05 DIAGNOSIS — F32A Depression, unspecified: Secondary | ICD-10-CM | POA: Diagnosis present

## 2021-06-05 DIAGNOSIS — Z20822 Contact with and (suspected) exposure to covid-19: Secondary | ICD-10-CM | POA: Diagnosis present

## 2021-06-05 DIAGNOSIS — I214 Non-ST elevation (NSTEMI) myocardial infarction: Principal | ICD-10-CM | POA: Diagnosis present

## 2021-06-05 DIAGNOSIS — Z720 Tobacco use: Secondary | ICD-10-CM | POA: Diagnosis not present

## 2021-06-05 DIAGNOSIS — R778 Other specified abnormalities of plasma proteins: Secondary | ICD-10-CM | POA: Diagnosis not present

## 2021-06-05 LAB — CBC WITH DIFFERENTIAL/PLATELET
Abs Immature Granulocytes: 0.09 10*3/uL — ABNORMAL HIGH (ref 0.00–0.07)
Basophils Absolute: 0 10*3/uL (ref 0.0–0.1)
Basophils Relative: 0 %
Eosinophils Absolute: 0 10*3/uL (ref 0.0–0.5)
Eosinophils Relative: 0 %
HCT: 44.7 % (ref 36.0–46.0)
Hemoglobin: 13.4 g/dL (ref 12.0–15.0)
Immature Granulocytes: 1 %
Lymphocytes Relative: 7 %
Lymphs Abs: 0.9 10*3/uL (ref 0.7–4.0)
MCH: 25 pg — ABNORMAL LOW (ref 26.0–34.0)
MCHC: 30 g/dL (ref 30.0–36.0)
MCV: 83.2 fL (ref 80.0–100.0)
Monocytes Absolute: 1.1 10*3/uL — ABNORMAL HIGH (ref 0.1–1.0)
Monocytes Relative: 8 %
Neutro Abs: 11.4 10*3/uL — ABNORMAL HIGH (ref 1.7–7.7)
Neutrophils Relative %: 84 %
Platelets: 202 10*3/uL (ref 150–400)
RBC: 5.37 MIL/uL — ABNORMAL HIGH (ref 3.87–5.11)
RDW: 20.1 % — ABNORMAL HIGH (ref 11.5–15.5)
WBC: 13.6 10*3/uL — ABNORMAL HIGH (ref 4.0–10.5)
nRBC: 0 % (ref 0.0–0.2)

## 2021-06-05 LAB — COMPREHENSIVE METABOLIC PANEL
ALT: 10 U/L (ref 0–44)
AST: 17 U/L (ref 15–41)
Albumin: 3.1 g/dL — ABNORMAL LOW (ref 3.5–5.0)
Alkaline Phosphatase: 71 U/L (ref 38–126)
Anion gap: 9 (ref 5–15)
BUN: 28 mg/dL — ABNORMAL HIGH (ref 8–23)
CO2: 29 mmol/L (ref 22–32)
Calcium: 8.4 mg/dL — ABNORMAL LOW (ref 8.9–10.3)
Chloride: 95 mmol/L — ABNORMAL LOW (ref 98–111)
Creatinine, Ser: 0.9 mg/dL (ref 0.44–1.00)
GFR, Estimated: >60 mL/min (ref >60)
Glucose, Bld: 104 mg/dL — ABNORMAL HIGH (ref 70–99)
Potassium: 4.4 mmol/L (ref 3.5–5.1)
Sodium: 133 mmol/L — ABNORMAL LOW (ref 135–145)
Total Bilirubin: 0.8 mg/dL (ref 0.3–1.2)
Total Protein: 7 g/dL (ref 6.5–8.1)

## 2021-06-05 LAB — RESP PANEL BY RT-PCR (FLU A&B, COVID) ARPGX2
Influenza A by PCR: NEGATIVE
Influenza B by PCR: NEGATIVE
SARS Coronavirus 2 by RT PCR: NEGATIVE

## 2021-06-05 LAB — TROPONIN I (HIGH SENSITIVITY)
Troponin I (High Sensitivity): 1331 ng/L (ref <18)
Troponin I (High Sensitivity): 1633 ng/L (ref <18)

## 2021-06-05 LAB — BRAIN NATRIURETIC PEPTIDE: B Natriuretic Peptide: 869 pg/mL — ABNORMAL HIGH (ref 0.0–100.0)

## 2021-06-05 MED ORDER — ATORVASTATIN CALCIUM 40 MG PO TABS
40.0000 mg | ORAL_TABLET | Freq: Every day | ORAL | Status: DC
  Administered 2021-06-05 – 2021-06-07 (×3): 40 mg via ORAL
  Filled 2021-06-05 (×3): qty 1

## 2021-06-05 MED ORDER — DOXYLAMINE SUCCINATE (SLEEP) 25 MG PO TABS
25.0000 mg | ORAL_TABLET | Freq: Every evening | ORAL | Status: DC | PRN
  Filled 2021-06-05: qty 1

## 2021-06-05 MED ORDER — NITROGLYCERIN 0.4 MG SL SUBL: 0.4000 mg | SUBLINGUAL_TABLET | SUBLINGUAL | Status: DC | PRN

## 2021-06-05 MED ORDER — NITROGLYCERIN IN D5W 200-5 MCG/ML-% IV SOLN
5.0000 ug/min | INTRAVENOUS | Status: DC
  Administered 2021-06-05: 5 ug/min via INTRAVENOUS
  Filled 2021-06-05: qty 250

## 2021-06-05 MED ORDER — CLONAZEPAM 0.25 MG PO TBDP
0.2500 mg | ORAL_TABLET | Freq: Three times a day (TID) | ORAL | Status: DC | PRN
  Administered 2021-06-05: 0.25 mg via ORAL
  Filled 2021-06-05: qty 1

## 2021-06-05 MED ORDER — ACETAMINOPHEN 325 MG PO TABS: 650.0000 mg | ORAL_TABLET | ORAL | Status: DC | PRN

## 2021-06-05 MED ORDER — MORPHINE SULFATE (PF) 2 MG/ML IV SOLN
2.0000 mg | Freq: Once | INTRAVENOUS | Status: AC
  Administered 2021-06-05: 2 mg via INTRAVENOUS
  Filled 2021-06-05: qty 1

## 2021-06-05 MED ORDER — HEPARIN BOLUS VIA INFUSION
4000.0000 [IU] | Freq: Once | INTRAVENOUS | Status: AC
  Administered 2021-06-05: 4000 [IU] via INTRAVENOUS

## 2021-06-05 MED ORDER — ALBUTEROL SULFATE (2.5 MG/3ML) 0.083% IN NEBU: 2.5000 mg | INHALATION_SOLUTION | RESPIRATORY_TRACT | Status: DC | PRN

## 2021-06-05 MED ORDER — PANTOPRAZOLE SODIUM 40 MG PO TBEC
40.0000 mg | DELAYED_RELEASE_TABLET | Freq: Every day | ORAL | Status: DC
  Administered 2021-06-05 – 2021-06-06 (×2): 40 mg via ORAL
  Filled 2021-06-05 (×2): qty 1

## 2021-06-05 MED ORDER — HEPARIN SODIUM (PORCINE) 5000 UNIT/ML IJ SOLN
INTRAMUSCULAR | Status: AC
  Filled 2021-06-05: qty 1

## 2021-06-05 MED ORDER — METOPROLOL TARTRATE 12.5 MG HALF TABLET
12.5000 mg | ORAL_TABLET | Freq: Two times a day (BID) | ORAL | Status: DC
  Administered 2021-06-05 – 2021-06-07 (×4): 12.5 mg via ORAL
  Filled 2021-06-05 (×4): qty 1

## 2021-06-05 MED ORDER — ASPIRIN EC 81 MG PO TBEC
81.0000 mg | DELAYED_RELEASE_TABLET | Freq: Every day | ORAL | Status: DC
  Administered 2021-06-06 – 2021-06-07 (×2): 81 mg via ORAL
  Filled 2021-06-05 (×2): qty 1

## 2021-06-05 MED ORDER — POTASSIUM CHLORIDE ER 10 MEQ PO TBCR
10.0000 meq | EXTENDED_RELEASE_TABLET | Freq: Every day | ORAL | Status: DC
  Administered 2021-06-05 – 2021-06-07 (×3): 10 meq via ORAL
  Filled 2021-06-05 (×8): qty 1

## 2021-06-05 MED ORDER — HEPARIN (PORCINE) 25000 UT/250ML-% IV SOLN
1250.0000 [IU]/h | INTRAVENOUS | Status: DC
  Administered 2021-06-05: 16:00:00 800 [IU]/h via INTRAVENOUS
  Filled 2021-06-05 (×2): qty 250

## 2021-06-05 MED ORDER — ONDANSETRON HCL 4 MG/2ML IJ SOLN: 4.0000 mg | Freq: Four times a day (QID) | INTRAMUSCULAR | Status: DC | PRN

## 2021-06-05 NOTE — Progress Notes (Signed)
ANTICOAGULATION CONSULT NOTE - Initial Consult ? ?Pharmacy Consult for heparin gtt  ?Indication: chest pain/ACS ? ?Allergies  ?Allergen Reactions  ? Chantix [Varenicline] Nausea And Vomiting  ? Codeine Other (See Comments)  ?  hallucinations  ? Oxycodone Palpitations  ? ? ?Patient Measurements: ?Height: _0  (165.1 cm) ?Weight: 70 kg (154 lb 5.2 oz) ?IBW/kg (Calculated) : 57 ?Heparin Dosing Weight: HEPARIN DW (KG): 70 ? ? ?Vital Signs: ?Temp: 98.8 ?F (37.1 ?C) (03/02 1510) ?Temp Source: Oral (03/02 1510) ?BP: 129/68 (03/02 1500) ?Pulse Rate: 74 (03/02 1510) ? ?Labs: ?Recent Labs  ?  06/05/21 ?1525  ?HGB 13.4  ?HCT 44.7  ?PLT 202  ? ? ?CrCl cannot be calculated (Patient's most recent lab result is older than the maximum 21 days allowed.). ? ? ?Medical History: ?Past Medical History:  ?Diagnosis Date  ? Arthritis   ? CHF (congestive heart failure) ([place])   ? Chronic back pain   ? COPD (chronic obstructive pulmonary disease) ([place])   ? patient is on oxygen at night when needed  ? Depression   ? GERD (gastroesophageal reflux disease)   ? Heart failure ([place])   ? RV  ? Hypertension   ? Neuropathy   ? Pulmonary HTN ([place])   ? Thyroid disease   ? Ulcer   ? Billroth I  ? ? ?Medications:  ?(Not in a hospital admission)  ?Scheduled:  ? heparin  4,000 Units Intravenous Once  ? ?Infusions:  ? heparin    ? nitroGLYCERIN    ? ?PRN:  ?Anti-infectives (From admission, onward)  ? ? None  ? ?  ? ? ?Assessment: ?[name] a 76 y.o. female requires anticoagulation with a heparin iv infusion for the indication of  chest pain/ACS. Heparin gtt will be started following pharmacy protocol per pharmacy consult. Patient is not on previous oral anticoagulant that will require aPTT/HL correlation before transitioning to only HL monitoring.  ? ?Goal of Therapy:  ?Heparin level 0.3-0.7 units/ml ?Monitor platelets by anticoagulation protocol: Yes ?  ?Plan:  ?Give 4000 units bolus x 1 ?Start heparin infusion at 800 units/hr ?Check anti-Xa  level in 8 hours and daily while on heparin ?Continue to monitor H&H and platelets ? ? ?Donna Christen Bonnie Roig ?06/05/2021,3:52 PM ? ? ?

## 2021-06-05 NOTE — ED Provider Notes (Signed)
[facility] Provider Note   CSN: [id number] Arrival date & time: 06/05/21  1451     History  Chief Complaint  Patient presents with   Chest Pain    [name] is a 76 y.o. female.  Patient has a history of congestive heart failure.  She had chest discomfort yesterday pretty significant for couple hours.  Then she had more pain today.  She has minimal discomfort now  The history is provided by the patient and medical records. No language interpreter was used.  Chest Pain Pain location:  Substernal area Pain quality: aching   Pain radiates to:  Does not radiate Pain severity:  Moderate Onset quality:  Sudden Timing:  Constant Progression:  Worsening Chronicity:  New Relieved by:  Nothing Associated symptoms: no abdominal pain, no back pain, no cough, no fatigue and no headache       Home Medications Prior to Admission medications   Medication Sig Start Date End Date Taking? Authorizing Provider  albuterol (PROVENTIL) (2.5 MG/3ML) 0.083% nebulizer solution Take 2.5 mg by nebulization in the morning and at bedtime. 08/16/20   [provider]  amLODipine (NORVASC) 10 MG tablet Take 1 tablet (10 mg total) by mouth daily. 08/08/20   Larey Dresser, MD  clonazePAM (KLONOPIN) 1 MG tablet Take 0.5-1 mg by mouth 4 (four) times daily as needed for anxiety.    [provider]  COMBIVENT RESPIMAT 20-100 MCG/ACT AERS respimat Inhale 1 puff into the lungs in the morning and at bedtime. 08/16/20   [provider]  dapagliflozin propanediol (FARXIGA) 10 MG TABS tablet Take 1 tablet (10 mg total) by mouth daily before breakfast. 08/08/20   Larey Dresser, MD  escitalopram (LEXAPRO) 10 MG tablet Take 10 mg by mouth at bedtime. 07/15/18   [provider]  furosemide (LASIX) 40 MG tablet Take 1 tablet (40 mg total) by mouth daily. 08/08/20   Larey Dresser, MD  gabapentin (NEURONTIN) 300 MG capsule Take 300 mg by mouth 3 (three)  times daily.    [provider]  HYDROcodone-acetaminophen (NORCO) 10-325 MG tablet Take 1 tablet by mouth every 4 (four) hours as needed (severe back pain.). 01/22/17   [provider]  pantoprazole (PROTONIX) 40 MG tablet Take 40 mg by mouth at bedtime. 08/24/12   [provider]  potassium chloride (KLOR-CON) 10 MEQ tablet TAKE (1) TABLET BY MOUTH TWICE DAILY. 08/08/20   Larey Dresser, MD      Allergies    Chantix [varenicline], Codeine, and Oxycodone    Review of Systems   Review of Systems  Constitutional:  Negative for appetite change and fatigue.  HENT:  Negative for congestion, ear discharge and sinus pressure.   Eyes:  Negative for discharge.  Respiratory:  Negative for cough.   Cardiovascular:  Positive for chest pain.  Gastrointestinal:  Negative for abdominal pain and diarrhea.  Genitourinary:  Negative for frequency and hematuria.  Musculoskeletal:  Negative for back pain.  Skin:  Negative for rash.  Neurological:  Negative for seizures and headaches.  Psychiatric/Behavioral:  Negative for hallucinations.    Physical Exam Updated Vital Signs BP 115/69    Pulse 76    Temp 98.8 F (37.1 C) (Oral)    Resp 18    Ht _0  (1.651 m)    Wt 70 kg    SpO2 98%    BMI 25.68 kg/m  Physical Exam Vitals and nursing note reviewed.  Constitutional:  Appearance: She is well-developed.  HENT:     Head: Normocephalic.     Nose: Nose normal.  Eyes:     General: No scleral icterus.    Conjunctiva/sclera: Conjunctivae normal.  Neck:     Thyroid: No thyromegaly.  Cardiovascular:     Rate and Rhythm: Normal rate and regular rhythm.     Heart sounds: No murmur heard.   No friction rub. No gallop.  Pulmonary:     Breath sounds: No stridor. No wheezing or rales.  Chest:     Chest wall: No tenderness.  Abdominal:     General: There is no distension.     Tenderness: There is no abdominal tenderness. There is no rebound.  Musculoskeletal:         General: Normal range of motion.     Cervical back: Neck supple.  Lymphadenopathy:     Cervical: No cervical adenopathy.  Skin:    Findings: No erythema or rash.  Neurological:     Mental Status: She is alert and oriented to person, place, and time.     Motor: No abnormal muscle tone.     Coordination: Coordination normal.  Psychiatric:        Behavior: Behavior normal.    ED Results / Procedures / Treatments   Labs (all labs ordered are listed, but only abnormal results are displayed) Labs Reviewed  CBC WITH DIFFERENTIAL/PLATELET - Abnormal; Notable for the following components:      Result Value   WBC 13.6 (*)    RBC 5.37 (*)    MCH 25.0 (*)    RDW 20.1 (*)    Neutro Abs 11.4 (*)    Monocytes Absolute 1.1 (*)    Abs Immature Granulocytes 0.09 (*)    All other components within normal limits  COMPREHENSIVE METABOLIC PANEL - Abnormal; Notable for the following components:   Sodium 133 (*)    Chloride 95 (*)    Glucose, Bld 104 (*)    BUN 28 (*)    Calcium 8.4 (*)    Albumin 3.1 (*)    All other components within normal limits  TROPONIN I (HIGH SENSITIVITY) - Abnormal; Notable for the following components:   Troponin I (High Sensitivity) 1,331 (*)    All other components within normal limits  RESP PANEL BY RT-PCR (FLU A&B, COVID) ARPGX2  HEPARIN LEVEL (UNFRACTIONATED)  BRAIN NATRIURETIC PEPTIDE  HEPARIN LEVEL (UNFRACTIONATED)  CBC  TROPONIN I (HIGH SENSITIVITY)    EKG None  Radiology DG Chest Port 1 View  Result Date: 06/05/2021 CLINICAL DATA:  Chest pain since last night EXAM: PORTABLE CHEST 1 VIEW COMPARISON:  05/15/2019 FINDINGS: Gross cardiomegaly. Pulmonary vascular prominence and mild, diffuse interstitial opacity. No focal airspace opacity. The visualized skeletal structures are unremarkable. IMPRESSION: 1.  Gross cardiomegaly. 2. Pulmonary vascular prominence and mild, diffuse interstitial opacity, likely edema. No focal airspace opacity. Electronically  Signed   By: Delanna Ahmadi M.D.   On: 06/05/2021 15:46    Procedures Procedures    Medications Ordered in ED Medications  nitroGLYCERIN 50 mg in dextrose 5 % 250 mL (0.2 mg/mL) infusion (10 mcg/min Intravenous Rate/Dose Change 06/05/21 1614)  heparin ADULT infusion 100 units/mL (25000 units/252m) (800 Units/hr Intravenous New Bag/Given 06/05/21 1603)  heparin 5000 UNIT/ML injection (  Not Given 06/05/21 1604)  morphine (PF) 2 MG/ML injection 2 mg (2 mg Intravenous Given 06/05/21 1548)  heparin bolus via infusion 4,000 Units (4,000 Units Intravenous Bolus from Bag 06/05/21 1603)  ED Course/ Medical Decision Making/ A&P  CRITICAL CARE Performed by: Milton Ferguson Total critical care time: 40 minutes Critical care time was exclusive of separately billable procedures and treating other patients. Critical care was necessary to treat or prevent imminent or life-threatening deterioration. Critical care was time spent personally by me on the following activities: development of treatment plan with patient and/or surrogate as well as nursing, discussions with consultants, evaluation of patient's response to treatment, examination of patient, obtaining history from patient or surrogate, ordering and performing treatments and interventions, ordering and review of laboratory studies, ordering and review of radiographic studies, pulse oximetry and re-evaluation of patient's condition.  This patient presents to the ED for concern of chest pain, this involves an extensive number of treatment options, and is a complaint that carries with it a high risk of complications and morbidity.  The differential diagnosis includes MI COPD   Co morbidities that complicate the patient evaluation  Hypertension COPD   Additional history obtained:  Additional history obtained from patient External records from outside source obtained and reviewed including hospital record   Lab Tests:  I Ordered, and personally  interpreted labs.  The pertinent results include: Troponin elevated at 1700 white count elevated 11.5   Imaging Studies ordered:  I ordered imaging studies including chest x-ray I independently visualized and interpreted imaging which showed pulmonary vascular problem I agree with the radiologist interpretation   Cardiac Monitoring:  The patient was maintained on a cardiac monitor.  I personally viewed and interpreted the cardiac monitored which showed an underlying rhythm of: Normal sinus rhythm   Medicines ordered and prescription drug management:  I ordered medication including heparin for coronary disease Reevaluation of the patient after these medicines showed that the patient improved I have reviewed the patients home medicines and have made adjustments as needed   Test Considered:  Cardiac cath   Critical Interventions:  Transfer to [facility] on heparin for cardiology consult   Consultations Obtained:  I requested consultation with the cardiology,  and discussed lab and imaging findings as well as pertinent plan - they recommend: Admission to [facility]   Problem List / ED Course:  Coronary artery disease and COPD    Social Determinants of Health:  None     After getting an EKG and seeing that it was suggestive of a STEMI I spoke with Dr. Pernell Dupre cardiology and he stated it did not look like a STEMI to him but he felt like the patient needed to be put on heparin and nitro and be admitted by medicine over [facility] and have cardiology consult because she may need a catheterization                          Medical Decision Making Amount and/or Complexity of Data Reviewed Labs: ordered. Radiology: ordered.  Risk Prescription drug management. Decision regarding hospitalization.   Patient with an NSTEMI and started on nitro and heparin and will be transferred to [facility]        Final Clinical Impression(s) / ED  Diagnoses Final diagnoses:  NSTEMI (non-ST elevated myocardial infarction) [facility])    Rx / DC Orders ED Discharge Orders     None         Milton Ferguson, MD 06/07/21 [id number]

## 2021-06-05 NOTE — ED Notes (Signed)
Pt's Son called for an update. Update given and notified that pt probably would not get transferred to [facility] until the morning.  ?

## 2021-06-05 NOTE — ED Notes (Addendum)
Pt requesting clonazepam for her legs at this time. Pt states that the nerves in her legs cause them to jump. Called [facility] for medication. ?Sprite given to pt ?

## 2021-06-05 NOTE — ED Triage Notes (Signed)
Patient states CP since last night and states no CP now. 351m apririn given en route via EMS. Patient 85% on RA and 3L/[place] placed on patient at this time. Patient states she is supposed to wear 2.3-3L O2 at home "sometimes."  ?

## 2021-06-05 NOTE — H&P (Signed)
[facility] H&P   Patient Demographics:    [name], is a 76 y.o. female  MRN: [id number]   DOB - Nov 25, 1945  Admit Date - 06/05/2021  Outpatient Primary MD for the patient is Lemmie Evens, MD  Referring MD/NP/PA: Dr Roderic Palau  Outpatient Specialists: Cardiolgoy Dr Harrington Challenger    Patient coming from: home   Chief Complaint  Patient presents with   Chest Pain      HPI:    [name]  is a 76 y.o. female, with past medical history for pulmonary hypertension, right ventricular failure, COPD, GERD. -Patient presents to ED secondary to complaints of chest pain, developed over last 24 hours, that has worsened over the last 2 hours, she denies any palpitation, nausea, but she does report diaphoresis, she had some chest pain reproducible by palpation, but this is different, now she reports it is mainly discomfort upon presentation to ED, she denies any hemoptysis, coffee-ground emesis, fever or chills, she denies any sick contacts. -In ED patient troponin significantly elevated at 1300, EKG with ST abnormalities, ED physician discussed with STEMI code at [facility] Dr. Daneen Schick, who reviewed imaging, where it appears to be not a STEMI, commendation for patient to be transferred to [facility], and to start heparin drip, and nitro drip, hospitalist consulted to admit.   Review of systems:    In addition to the HPI above,   A full 10 point Review of Systems was done, except as stated above, all other Review of Systems were negative.   With Past History of the following :    Past Medical History:  Diagnosis Date   Arthritis    CHF (congestive heart failure) (HCC)    Chronic back pain    COPD (chronic obstructive pulmonary disease) (HCC)    patient is on oxygen at night when needed   Depression    GERD (gastroesophageal reflux disease)    Heart failure  (HCC)    RV   Hypertension    Neuropathy    Pulmonary HTN (HCC)    Thyroid disease    Ulcer    Billroth I      Past Surgical History:  Procedure Laterality Date   ABDOMINAL HYSTERECTOMY     BACK SURGERY     X4   BILROTH I PROCEDURE     BIOPSY  12/18/2019   Procedure: BIOPSY;  Surgeon: Daneil Dolin, MD;  [facility];  Service: Endoscopy;;   BIOPSY  12/27/2020   Procedure: BIOPSY;  Surgeon: Daneil Dolin, MD;  [facility];  Service: Endoscopy;;   CHOLECYSTECTOMY     COLONOSCOPY  05/18/2003   OPF:[id number] colonoscopy/ Internal hemorrhoids.  Otherwise, normal rectum   COLONOSCOPY  08/12/2010   Dr. Arnoldo Morale: cecum visualized and normal, colon and rectum normal. Torturous colon   COLONOSCOPY N/A 09/07/2012   RMR: tubular adenoma, lipoma.  Due for surveillance in 2021   COLONOSCOPY WITH PROPOFOL N/A 12/18/2019   Rourk: Three 2-70m polyps removed from the descending colon, tubular adenomas.  Diverticulosis.  Noncompliant left colon.   ESOPHAGOGASTRODUODENOSCOPY  05/18/2003   RMR:. Normal esophagus/Adenomatous-appearing mucosa at the anastomosis   ESOPHAGOGASTRODUODENOSCOPY  08/12/2010   Dr. JArnoldo Morale anastomosis widely patent, no ulcerations, CLO test negative   ESOPHAGOGASTRODUODENOSCOPY (EGD) WITH PROPOFOL N/A 12/18/2019   Rourk: Normal esophagus status post dilation.  Prior hemigastrectomy.  Small bowel nodule of uncertain significance.  Biopsy with mild reactive changes and focal intestinal metaplasia.  Recommend 1 year follow-up EGD.   ESOPHAGOGASTRODUODENOSCOPY (EGD) WITH PROPOFOL N/A 12/27/2020   Rourk:Normal esophagus s/p dilation, s/p hemigastrectomy, Billroth I configuration, anastomotic nodule biopsied, polypoid duodenal mucosa with surface gastric foveolar metaplasia s/p peptic injury, no malignancy   MALONEY DILATION N/A 12/18/2019   Procedure: MALONEY DILATION;  Surgeon: RDaneil Dolin MD;  [facility];  Service: Endoscopy;   Laterality: N/A;   MALONEY DILATION N/A 12/27/2020   Procedure: MVenia MinksDILATION;  Surgeon: RDaneil Dolin MD;  [facility];  Service: Endoscopy;  Laterality: N/A;   POLYPECTOMY  12/18/2019   Procedure: POLYPECTOMY;  Surgeon: RDaneil Dolin MD;  [facility];  Service: Endoscopy;;   RIGHT HEART CATH N/A 05/04/2018   Procedure: RIGHT HEART CATH;  Surgeon: MLarey Dresser MD;  [facility];  Service: Cardiovascular;  Laterality: N/A;   STOMACH SURGERY     removed partial stomach   YAG LASER APPLICATION Left 124/82/5003  Procedure: YAG LASER APPLICATION;  Surgeon: MRutherford Guys MD;  Location: [facility];  Service: Ophthalmology;  Laterality: Left;   YAG LASER APPLICATION Right 170/48/8891  Procedure: YAG LASER APPLICATION;  Surgeon: MRutherford Guys MD;  Location: [facility];  Service: Ophthalmology;  Laterality: Right;      Social History:     Social History   Tobacco Use   Smoking status: Every Day    Packs/day: 0.30    Years: 15.00    Pack years: 4.50    Types: Cigarettes   Smokeless tobacco: Never  Substance Use Topics   Alcohol use: No     Family History :     Family History  Problem Relation Age of Onset   Cancer Sister        breast cancer   Cancer Brother 561      esophageal cancer   Cancer Maternal Aunt        pancreas?   Cancer Paternal Aunt        multiple aunts had cancer   Colon cancer Neg Hx      Home Medications:   Prior to Admission medications   Medication Sig Start Date End Date Taking? Authorizing Provider  albuterol (PROVENTIL) (2.5 MG/3ML) 0.083% nebulizer solution Take 2.5 mg by nebulization in the morning and at bedtime. 08/16/20  Yes [provider]  amLODipine (NORVASC) 10 MG tablet Take 1 tablet (10 mg total) by mouth daily. 08/08/20  Yes MLarey Dresser MD  clonazePAM (KLONOPIN) 1 MG tablet Take 0.5-1 mg by mouth 4 (four) times daily as needed for anxiety.   Yes [provider]  COMBIVENT  RESPIMAT 20-100 MCG/ACT AERS respimat Inhale 1 puff into the lungs in the morning and at bedtime. 08/16/20  Yes [provider]  doxylamine, Sleep, (UNISOM) 25 MG tablet Take 25 mg by mouth at bedtime as needed.   Yes [provider]  escitalopram (LEXAPRO) 10 MG tablet Take 10 mg by mouth at bedtime. 07/15/18  Yes [provider]  furosemide (LASIX) 40 MG tablet Take 1 tablet (40 mg total) by mouth daily. Patient taking differently: Take 40 mg by mouth daily as needed for fluid. 08/08/20  Yes Larey Dresser, MD  gabapentin (NEURONTIN) 300 MG capsule Take 300 mg by mouth 3 (three) times daily.   Yes [provider]  HYDROcodone-acetaminophen (NORCO) 10-325 MG tablet Take 1 tablet by mouth every 4 (four) hours as needed (severe back pain.). 01/22/17  Yes [provider]  pantoprazole (PROTONIX) 40 MG tablet Take 40 mg by mouth at bedtime. 08/24/12  Yes [provider]  potassium chloride (KLOR-CON) 10 MEQ tablet TAKE (1) TABLET BY MOUTH TWICE DAILY. 08/08/20  Yes Larey Dresser, MD  dapagliflozin propanediol (FARXIGA) 10 MG TABS tablet Take 1 tablet (10 mg total) by mouth daily before breakfast. Patient not taking: Reported on 06/05/2021 08/08/20   Larey Dresser, MD     Allergies:     Allergies  Allergen Reactions   Chantix [Varenicline] Nausea And Vomiting   Codeine Other (See Comments)    hallucinations   Oxycodone Palpitations     Physical Exam:   Vitals  Blood pressure (!) 145/79, pulse 70, temperature 98.8 F (37.1 C), temperature source Oral, resp. rate (!) 21, height _0  (1.651 m), weight 70 kg, SpO2 94 %.   1. General frail, thin appearing female, laying in bed in no apparent distress  2. Normal affect and insight, Not Suicidal or Homicidal, Awake Alert, Oriented X 3.  3. No F.N deficits, ALL C.Nerves Intact, Strength 5/5 all 4 extremities, Sensation intact all 4 extremities, Plantars down going.  4. Ears and Eyes appear  Normal, Conjunctivae clear, PERRLA. Moist Oral Mucosa.  5. Supple Neck, + JVD, No cervical lymphadenopathy appriciated, No Carotid Bruits.  6. Symmetrical Chest wall movement, Good air movement bilaterally, she has scattered wheezing, she had tenderness to palpation in left chest area, but this is different than chest pain she presented with.  7. RRR, No Gallops, Rubs or Murmurs, No Parasternal Heave.  8. Positive Bowel Sounds, Abdomen Soft, No tenderness, No organomegaly appriciated,No rebound -guarding or rigidity.  9.  No Cyanosis, Normal Skin Turgor, No Skin Rash or Bruise.  10. Good muscle tone,  joints appear normal , no effusions, Normal ROM.  11. No Palpable Lymph Nodes in Neck or Axillae     Data Review:    CBC Recent Labs  Lab 06/05/21 1525  WBC 13.6*  HGB 13.4  HCT 44.7  PLT 202  MCV 83.2  MCH 25.0*  MCHC 30.0  RDW 20.1*  LYMPHSABS 0.9  MONOABS 1.1*  EOSABS 0.0  BASOSABS 0.0   ------------------------------------------------------------------------------------------------------------------  Chemistries  Recent Labs  Lab 06/05/21 1525  NA 133*  K 4.4  CL 95*  CO2 29  GLUCOSE 104*  BUN 28*  CREATININE 0.90  CALCIUM 8.4*  AST 17  ALT 10  ALKPHOS 71  BILITOT 0.8   ------------------------------------------------------------------------------------------------------------------ estimated creatinine clearance is 52.2 mL/min (by C-G formula based on SCr of 0.9 mg/dL). ------------------------------------------------------------------------------------------------------------------ No results for input(s): TSH, T4TOTAL, T3FREE, THYROIDAB in the last 72 hours.  Invalid input(s): FREET3  Coagulation profile No results for input(s): INR, PROTIME in the last 168 hours. ------------------------------------------------------------------------------------------------------------------- No results for input(s): DDIMER in the last 72  hours. -------------------------------------------------------------------------------------------------------------------  Cardiac Enzymes No results for input(s): CKMB, TROPONINI, MYOGLOBIN in the last 168 hours.  Invalid  input(s): CK ------------------------------------------------------------------------------------------------------------------    Component Value Date/Time   BNP 41.1 06/18/2020 0945     ---------------------------------------------------------------------------------------------------------------  Urinalysis    Component Value Date/Time   COLORURINE YELLOW 07/26/2018 1003   APPEARANCEUR CLEAR 07/26/2018 1003   LABSPEC 1.013 07/26/2018 1003   PHURINE 9.0 (H) 07/26/2018 1003   GLUCOSEU NEGATIVE 07/26/2018 1003   HGBUR NEGATIVE 07/26/2018 1003   BILIRUBINUR NEGATIVE 07/26/2018 1003   KETONESUR 20 (A) 07/26/2018 1003   PROTEINUR NEGATIVE 07/26/2018 1003   UROBILINOGEN 0.2 09/15/2012 0740   NITRITE NEGATIVE 07/26/2018 1003   LEUKOCYTESUR NEGATIVE 07/26/2018 1003    ----------------------------------------------------------------------------------------------------------------   Imaging Results:    DG Chest Port 1 View  Result Date: 06/05/2021 CLINICAL DATA:  Chest pain since last night EXAM: PORTABLE CHEST 1 VIEW COMPARISON:  05/15/2019 FINDINGS: Gross cardiomegaly. Pulmonary vascular prominence and mild, diffuse interstitial opacity. No focal airspace opacity. The visualized skeletal structures are unremarkable. IMPRESSION: 1.  Gross cardiomegaly. 2. Pulmonary vascular prominence and mild, diffuse interstitial opacity, likely edema. No focal airspace opacity. Electronically Signed   By: Delanna Ahmadi M.D.   On: 06/05/2021 15:46    My personal review of EKG: Rhythm NSR, Rate  73/min, QTc 462 ,  Acute ST changes   Assessment & Plan:    Principal Problem:   NSTEMI (non-ST elevated myocardial infarction) ([place]) Active Problems:   COPD exacerbation (HCC)    Pulmonary hypertension (HCC)   RVF (right ventricular failure) (HCC)   Tobacco use   GERD (gastroesophageal reflux disease)   NSTEMI -Patient presents with chest pain, troponin significantly elevated. -Initial EKG finding concerning for STEMI, ED physician discussed with STEMI code team at [facility] Dr. Daneen Schick, does not appear to be STEMI, but appears to be ACS, with recommendation to transfer to [facility] for cardiac evaluation and possible need for cath, meanwhile to start aspirin, heparin GTT and nitro drip. -Continue with aspirin -Started on Lipitor 40 mg and metoprolol 12.5 mg p.o. twice daily  COPD -She has some wheezing, but per son at bedside(who is RN) report this is her baseline, she denies any dyspnea. -Continue with as needed albuterol  Pulmonary hypertension/RV failure -Continue with oxygen -Tadalafil has been discontinued due to angioedema -She did not tolerate Tyvaso  Tobacco abuse -She is down to 3 cigarettes/day, counseled to quit totally  GERD  - Continue with PPI   DVT Prophylaxis Heparin GTT  AM Labs Ordered, also please review Full Orders  Family Communication: Admission, patients condition and plan of care including tests being ordered have been discussed with the patient and son at bedside who indicate understanding and agree with the plan and Code Status.  Code Status Full  Likely DC to  home  Condition GUARDED    Consults called: [facility] to be called with patient gets to [facility]    Admission status: inpatient    Time spent in minutes : 70 minutes   Phillips Climes M.D on 06/05/2021 at 6:31 PM   [facility] - Office  [phone]

## 2021-06-06 ENCOUNTER — Inpatient Hospital Stay (HOSPITAL_COMMUNITY): Payer: Medicare Other

## 2021-06-06 ENCOUNTER — Inpatient Hospital Stay (HOSPITAL_COMMUNITY)
Admission: EM | Disposition: A | Discharge status: Home / Self Care | Payer: Self-pay | Source: Home / Self Care | Attending: Internal Medicine

## 2021-06-06 DIAGNOSIS — R079 Chest pain, unspecified: Secondary | ICD-10-CM

## 2021-06-06 DIAGNOSIS — I214 Non-ST elevation (NSTEMI) myocardial infarction: Secondary | ICD-10-CM

## 2021-06-06 DIAGNOSIS — R778 Other specified abnormalities of plasma proteins: Secondary | ICD-10-CM

## 2021-06-06 HISTORY — PX: RIGHT/LEFT HEART CATH AND CORONARY ANGIOGRAPHY: CATH118266

## 2021-06-06 LAB — CBC
HCT: 41.5 % (ref 36.0–46.0)
HCT: 43.1 % (ref 36.0–46.0)
Hemoglobin: 11.9 g/dL — ABNORMAL LOW (ref 12.0–15.0)
Hemoglobin: 12.6 g/dL (ref 12.0–15.0)
MCH: 23.9 pg — ABNORMAL LOW (ref 26.0–34.0)
MCH: 24.1 pg — ABNORMAL LOW (ref 26.0–34.0)
MCHC: 28.7 g/dL — ABNORMAL LOW (ref 30.0–36.0)
MCHC: 29.2 g/dL — ABNORMAL LOW (ref 30.0–36.0)
MCV: 83.5 fL (ref 80.0–100.0)
MCV: 82.4 fL (ref 80.0–100.0)
Platelets: 176 10*3/uL (ref 150–400)
Platelets: 210 10*3/uL (ref 150–400)
RBC: 4.97 MIL/uL (ref 3.87–5.11)
RBC: 5.23 MIL/uL — ABNORMAL HIGH (ref 3.87–5.11)
RDW: 18.8 % — ABNORMAL HIGH (ref 11.5–15.5)
RDW: 18.5 % — ABNORMAL HIGH (ref 11.5–15.5)
WBC: 11.5 10*3/uL — ABNORMAL HIGH (ref 4.0–10.5)
WBC: 9.3 10*3/uL (ref 4.0–10.5)
nRBC: 0 % (ref 0.0–0.2)
nRBC: 0 % (ref 0.0–0.2)

## 2021-06-06 LAB — BASIC METABOLIC PANEL
Anion gap: 7 (ref 5–15)
BUN: 27 mg/dL — ABNORMAL HIGH (ref 8–23)
CO2: 29 mmol/L (ref 22–32)
Calcium: 8.3 mg/dL — ABNORMAL LOW (ref 8.9–10.3)
Chloride: 94 mmol/L — ABNORMAL LOW (ref 98–111)
Creatinine, Ser: 0.64 mg/dL (ref 0.44–1.00)
GFR, Estimated: >60 mL/min (ref >60)
Glucose, Bld: 94 mg/dL (ref 70–99)
Potassium: 4.5 mmol/L (ref 3.5–5.1)
Sodium: 130 mmol/L — ABNORMAL LOW (ref 135–145)

## 2021-06-06 LAB — CREATININE, SERUM
Creatinine, Ser: 0.69 mg/dL (ref 0.44–1.00)
GFR, Estimated: >60 mL/min (ref >60)

## 2021-06-06 LAB — HEPARIN LEVEL (UNFRACTIONATED)
Heparin Unfractionated: <0.1 IU/mL — ABNORMAL LOW (ref 0.30–0.70)
Heparin Unfractionated: <0.1 IU/mL — ABNORMAL LOW (ref 0.30–0.70)
Heparin Unfractionated: <0.1 IU/mL — ABNORMAL LOW (ref 0.30–0.70)

## 2021-06-06 LAB — ECHOCARDIOGRAM COMPLETE
AR max vel: 2.28 cm2
AV Area VTI: 2.21 cm2
AV Area mean vel: 2.21 cm2
AV Mean grad: 4 mmHg
AV Peak grad: 7 mmHg
Ao pk vel: 1.32 m/s
Area-P 1/2: 2.17 cm2
Height: 65 in
MV VTI: 1.44 cm2
S' Lateral: 2.3 cm
Weight: 2469.15 oz

## 2021-06-06 LAB — TROPONIN I (HIGH SENSITIVITY): Troponin I (High Sensitivity): 1784 ng/L (ref <18)

## 2021-06-06 SURGERY — RIGHT/LEFT HEART CATH AND CORONARY ANGIOGRAPHY
Anesthesia: LOCAL

## 2021-06-06 MED ORDER — SODIUM CHLORIDE 0.9 % IV SOLN
250.0000 mL | INTRAVENOUS | Status: DC | PRN
Start: 2021-06-06 — End: 2021-06-07

## 2021-06-06 MED ORDER — HYDROCODONE-ACETAMINOPHEN 10-325 MG PO TABS: 1.0000 | ORAL_TABLET | ORAL | Status: DC | PRN

## 2021-06-06 MED ORDER — HYDROCODONE-ACETAMINOPHEN 10-325 MG PO TABS
1.0000 | ORAL_TABLET | Freq: Four times a day (QID) | ORAL | Status: DC | PRN
  Administered 2021-06-06: 1 via ORAL
  Filled 2021-06-06: qty 1

## 2021-06-06 MED ORDER — MIDAZOLAM HCL 2 MG/2ML IJ SOLN
INTRAMUSCULAR | Status: AC
  Filled 2021-06-06: qty 2

## 2021-06-06 MED ORDER — DAPAGLIFLOZIN PROPANEDIOL 10 MG PO TABS: 10.0000 mg | ORAL_TABLET | Freq: Every day | ORAL | Status: DC

## 2021-06-06 MED ORDER — GABAPENTIN 300 MG PO CAPS
300.0000 mg | ORAL_CAPSULE | Freq: Three times a day (TID) | ORAL | Status: DC
  Administered 2021-06-06 (×3): 300 mg via ORAL
  Filled 2021-06-06 (×3): qty 1

## 2021-06-06 MED ORDER — MIDAZOLAM HCL 2 MG/2ML IJ SOLN
INTRAMUSCULAR | Status: DC | PRN
  Administered 2021-06-06 (×2): 1 mg via INTRAVENOUS

## 2021-06-06 MED ORDER — VERAPAMIL HCL 2.5 MG/ML IV SOLN
INTRAVENOUS | Status: AC
Start: 2021-06-06 — End: ?
  Filled 2021-06-06: qty 2

## 2021-06-06 MED ORDER — LIDOCAINE HCL (PF) 1 % IJ SOLN
INTRAMUSCULAR | Status: AC
  Filled 2021-06-06: qty 30

## 2021-06-06 MED ORDER — MELATONIN 3 MG PO TABS
3.0000 mg | ORAL_TABLET | Freq: Every evening | ORAL | Status: DC | PRN
  Administered 2021-06-06: 3 mg via ORAL
  Filled 2021-06-06: qty 1

## 2021-06-06 MED ORDER — SODIUM CHLORIDE 0.9 % IV SOLN
INTRAVENOUS | Status: AC | PRN
  Administered 2021-06-06: 10 mL/h via INTRAVENOUS

## 2021-06-06 MED ORDER — SODIUM CHLORIDE 0.9% FLUSH
3.0000 mL | Freq: Two times a day (BID) | INTRAVENOUS | Status: DC
  Administered 2021-06-06 – 2021-06-07 (×2): 3 mL via INTRAVENOUS

## 2021-06-06 MED ORDER — HEPARIN SODIUM (PORCINE) 5000 UNIT/ML IJ SOLN
5000.0000 [IU] | Freq: Three times a day (TID) | INTRAMUSCULAR | Status: DC
  Administered 2021-06-07: 5000 [IU] via SUBCUTANEOUS
  Filled 2021-06-06: qty 1

## 2021-06-06 MED ORDER — LIDOCAINE HCL (PF) 1 % IJ SOLN
INTRAMUSCULAR | Status: DC | PRN
  Administered 2021-06-06: 4 mL
  Administered 2021-06-06: 15 mL
  Administered 2021-06-06: 2 mL

## 2021-06-06 MED ORDER — HEPARIN SODIUM (PORCINE) 1000 UNIT/ML IJ SOLN
INTRAMUSCULAR | Status: AC
  Filled 2021-06-06: qty 10

## 2021-06-06 MED ORDER — HEPARIN SODIUM (PORCINE) 1000 UNIT/ML IJ SOLN
INTRAMUSCULAR | Status: DC | PRN
  Administered 2021-06-06: 3000 [IU] via INTRAVENOUS

## 2021-06-06 MED ORDER — PERFLUTREN LIPID MICROSPHERE
1.0000 mL | INTRAVENOUS | Status: AC | PRN
  Administered 2021-06-06: 2 mL via INTRAVENOUS
  Filled 2021-06-06: qty 10

## 2021-06-06 MED ORDER — SODIUM CHLORIDE 0.9 % IV SOLN: INTRAVENOUS | Status: AC

## 2021-06-06 MED ORDER — HEPARIN (PORCINE) IN NACL 1000-0.9 UT/500ML-% IV SOLN
INTRAVENOUS | Status: DC | PRN
  Administered 2021-06-06 (×2): 500 mL

## 2021-06-06 MED ORDER — CLONAZEPAM 0.25 MG PO TBDP
0.2500 mg | ORAL_TABLET | Freq: Three times a day (TID) | ORAL | Status: DC | PRN
  Administered 2021-06-06: 0.25 mg via ORAL
  Filled 2021-06-06 (×2): qty 1

## 2021-06-06 MED ORDER — HEPARIN (PORCINE) IN NACL 1000-0.9 UT/500ML-% IV SOLN
INTRAVENOUS | Status: AC
Start: 2021-06-06 — End: ?
  Filled 2021-06-06: qty 500

## 2021-06-06 MED ORDER — MORPHINE SULFATE (PF) 2 MG/ML IV SOLN
2.0000 mg | Freq: Once | INTRAVENOUS | Status: AC
  Administered 2021-06-06: 2 mg via INTRAVENOUS
  Filled 2021-06-06: qty 1

## 2021-06-06 MED ORDER — FUROSEMIDE 40 MG PO TABS
40.0000 mg | ORAL_TABLET | Freq: Every day | ORAL | Status: DC
  Administered 2021-06-06 – 2021-06-07 (×2): 40 mg via ORAL
  Filled 2021-06-06 (×2): qty 1

## 2021-06-06 MED ORDER — SODIUM CHLORIDE 0.9% FLUSH: 3.0000 mL | INTRAVENOUS | Status: DC | PRN

## 2021-06-06 MED ORDER — HEPARIN BOLUS VIA INFUSION
2500.0000 [IU] | Freq: Once | INTRAVENOUS | Status: AC
  Administered 2021-06-06: 2500 [IU] via INTRAVENOUS

## 2021-06-06 MED ORDER — IOHEXOL 350 MG/ML SOLN
INTRAVENOUS | Status: DC | PRN
  Administered 2021-06-06: 45 mL

## 2021-06-06 MED ORDER — FENTANYL CITRATE (PF) 100 MCG/2ML IJ SOLN
INTRAMUSCULAR | Status: DC | PRN
  Administered 2021-06-06 (×2): 25 ug via INTRAVENOUS

## 2021-06-06 MED ORDER — HEPARIN (PORCINE) IN NACL 1000-0.9 UT/500ML-% IV SOLN
INTRAVENOUS | Status: AC
  Filled 2021-06-06: qty 500

## 2021-06-06 MED ORDER — HEPARIN BOLUS VIA INFUSION
4000.0000 [IU] | Freq: Once | INTRAVENOUS | Status: AC
  Administered 2021-06-06: 4000 [IU] via INTRAVENOUS

## 2021-06-06 MED ORDER — FENTANYL CITRATE (PF) 100 MCG/2ML IJ SOLN
INTRAMUSCULAR | Status: AC
  Filled 2021-06-06: qty 2

## 2021-06-06 MED ORDER — ACETAMINOPHEN 325 MG PO TABS
650.0000 mg | ORAL_TABLET | ORAL | Status: DC | PRN
  Administered 2021-06-06: 650 mg via ORAL
  Filled 2021-06-06: qty 2

## 2021-06-06 SURGICAL SUPPLY — 15 items
CATH 5FR JL3.5 JR4 ANG PIG MP (CATHETERS) ×1 IMPLANT
CATH BALLN WEDGE 5F 110CM (CATHETERS) ×1 IMPLANT
CATH SWAN GANZ 7F STRAIGHT (CATHETERS) ×1 IMPLANT
DEVICE RAD COMP TR BAND LRG (VASCULAR PRODUCTS) ×1 IMPLANT
GLIDESHEATH SLEND SS 6F .021 (SHEATH) ×1 IMPLANT
GUIDEWIRE .025 260CM (WIRE) ×1 IMPLANT
GUIDEWIRE INQWIRE 1.5J.035X260 (WIRE) IMPLANT
INQWIRE 1.5J .035X260CM (WIRE) ×2
KIT HEART LEFT (KITS) ×2 IMPLANT
PACK CARDIAC CATHETERIZATION (CUSTOM PROCEDURE TRAY) ×2 IMPLANT
SHEATH GLIDE SLENDER 4/5FR (SHEATH) ×2 IMPLANT
SHEATH PINNACLE 7F 10CM (SHEATH) ×1 IMPLANT
SYR MEDRAD MARK 7 150ML (SYRINGE) ×2 IMPLANT
TRANSDUCER W/STOPCOCK (MISCELLANEOUS) ×2 IMPLANT
TUBING CIL FLEX 10 FLL-RA (TUBING) ×2 IMPLANT

## 2021-06-06 NOTE — ED Notes (Signed)
[facility] at bedside.  ?

## 2021-06-06 NOTE — ED Notes (Signed)
Pt repositioned herself and states that she has intermittent CP "in my left breast area"  ?Pt also states that across the top of her abdomen feels tight.  ? ?Helped pt get repositioned with pillows at this time, pt is resting more comfortably. ?

## 2021-06-06 NOTE — Progress Notes (Signed)
ANTICOAGULATION CONSULT NOTE  ? ?Pharmacy Consult for heparin gtt  ?Indication: chest pain/ACS ? ?Allergies  ?Allergen Reactions  ? Chantix [Varenicline] Nausea And Vomiting  ? Codeine Other (See Comments)  ?  hallucinations  ? Oxycodone Palpitations  ? ? ?Patient Measurements: ?Height: _0  (165.1 cm) ?Weight: 70 kg (154 lb 5.2 oz) ?IBW/kg (Calculated) : 57 ?Heparin Dosing Weight: HEPARIN DW (KG): 70 ? ? ?Vital Signs: ?Temp: 98.5 ?F (36.9 ?C) (03/02 2306) ?Temp Source: Oral (03/02 2306) ?BP: 151/83 (03/03 1030) ?Pulse Rate: 61 (03/03 1030) ? ?Labs: ?Recent Labs  ?  06/05/21 ?1525 06/06/21 ?0030 06/06/21 ?0329 06/06/21 ?0949  ?HGB 13.4  --  11.9*  --   ?HCT 44.7  --  41.5  --   ?PLT 202  --  176  --   ?HEPARINUNFRC  --  <0.10*  --  <0.10*  ?CREATININE 0.90  --  0.64  --   ? ? ? ?Estimated Creatinine Clearance: 58.7 mL/min (by C-G formula based on SCr of 0.64 mg/dL). ? ? ?Medical History: ?Past Medical History:  ?Diagnosis Date  ? Arthritis   ? CHF (congestive heart failure) ([place])   ? Chronic back pain   ? COPD (chronic obstructive pulmonary disease) ([place])   ? patient is on oxygen at night when needed  ? Depression   ? GERD (gastroesophageal reflux disease)   ? Heart failure ([place])   ? RV  ? Hypertension   ? Neuropathy   ? Pulmonary HTN ([place])   ? Thyroid disease   ? Ulcer   ? Billroth I  ? ? ?Medications:  ?(Not in a hospital admission) ?Scheduled:  ? aspirin EC  81 mg Oral Daily  ? atorvastatin  40 mg Oral Daily  ? dapagliflozin propanediol  10 mg Oral Daily  ? furosemide  40 mg Oral Daily  ? gabapentin  300 mg Oral TID  ? metoprolol tartrate  12.5 mg Oral BID  ? pantoprazole  40 mg Oral QHS  ? potassium chloride  10 mEq Oral Daily  ? ?Infusions:  ? heparin 1,050 Units/hr (06/06/21 0418)  ? nitroGLYCERIN 55 mcg/min (06/06/21 0800)  ? ?PRN:  ?Anti-infectives (From admission, onward)  ? ? None  ? ?  ? ? ?Assessment: ?[name] a 76 y.o. female requires anticoagulation with a heparin iv infusion for the  indication of  chest pain/ACS. Heparin gtt remains undetectable ( HL < 0.1) despite drip running appropriately without complications (discussed with RN). ? ?Goal of Therapy:  ?Heparin level 0.3-0.7 units/ml ?Monitor platelets by anticoagulation protocol: Yes ?  ?Plan:  ?Rebolus heparin 2500 units x 1 then increase rate to 1250 units/hr ?Recheck heparin level in 8 hours ? ?Lorenso Courier, PharmD ?Clinical Pharmacist ?06/06/2021 11:32 AM ? ? ? ? ? ? ?

## 2021-06-06 NOTE — ED Notes (Addendum)
Patient OOB due to urinating in bed. Patient did not call for assistance. This nurse educated patient on importance of using call light when she needs help. Patient verbalized understanding of using call light. New bed linens placed and gown.  ?

## 2021-06-06 NOTE — Progress Notes (Signed)
?  Transition of Care (TOC) Screening Note ? ? ?Patient Details  ?Name: [name] ?Date of Birth: 27-Jul-1945 ? ? ?Transition of Care (TOC) CM/SW Contact:    ?Boneta Lucks, RN ?Phone Number: ?06/06/2021, 12:12 PM ? ? ? ?Transition of Care Department [facility]) has reviewed patient and no TOC needs have been identified at this time. We will continue to monitor patient advancement through interdisciplinary progression rounds. If new patient transition needs arise, please place a TOC consult. ? ? ?

## 2021-06-06 NOTE — ED Notes (Signed)
Lab at bedside ?

## 2021-06-06 NOTE — Progress Notes (Signed)
PROGRESS NOTE    BONNIE OVERDORF  GTX:[id number] DOB: 1945-07-02 DOA: 06/05/2021 PCP: Lemmie Evens, MD    Brief Narrative:  76 y/o female with history of COPD, pulm HTN/RV failure, admitted to the hospital with chest pain. Trop elevation consistent with NSTEMI. Discussed with cardiology and started on heparin/nitro infusions. Plans are for transfer to [facility] for cath.   Assessment & Plan:   Principal Problem:   NSTEMI (non-ST elevated myocardial infarction) ([place]) Active Problems:   COPD exacerbation ([place])   Pulmonary hypertension ([place])   RVF (right ventricular failure) ([place])   Tobacco use   GERD (gastroesophageal reflux disease)   NSTEMI -last troponin 1331 yesterday. Lab from this morning pending -she is currently on heparin infusion, nitro infusion -reports intermittent chest pain -continue on aspirin -continue statin -cardiology following -based on conversations yesterday, plans are for transfer to [facility] for cardiac cath  Pulmonary hypertension with RV failure -followed by Dr. Aundra Dubin -chronically on lasix, but takes at home as needed -she is not on aldactone due to possible angioedema in the past -she is on dapagliflozin  COPD -no shortness of breath at this time -continue bronchodilators as needed  GERD Continue PPI  Tobacco use -counseled on the importance of tobacco cessation   DVT prophylaxis: heparin 5000 UNIT/ML injection Start: 06/05/21 1550  Code Status: full code Family Communication: discussed with patient Disposition Plan: Status is: Inpatient Remains inpatient appropriate because: further cardiac work up with transfer to [facility] for cath             Consultants:  cardiology  Procedures:    Antimicrobials:      Subjective: Patient reports intermittent left sided chest pain, reproducible with left arm movement and occasionally with palpation of left chest. No shortness of breath  Objective: Vitals:   06/06/21 0700 06/06/21  0730 06/06/21 0800 06/06/21 0830  BP: 115/73 124/67 124/78 124/67  Pulse: 62 61 63 67  Resp: _0 Temp:      TempSrc:      SpO2: 99% 97% 96% 93%  Weight:      Height:        Intake/Output Summary (Last 24 hours) at 06/06/2021 0919 Last data filed at 06/06/2021 2482 Gross per 24 hour  Intake 238.14 ml  Output --  Net 238.14 ml   Filed Weights   06/05/21 1530  Weight: 70 kg    Examination:  General exam: Appears calm and comfortable  Respiratory system: Clear to auscultation. Respiratory effort normal. Cardiovascular system: S1 & S2 heard, RRR. No JVD, murmurs, rubs, gallops or clicks. No pedal edema. Gastrointestinal system: Abdomen is nondistended, soft and nontender. No organomegaly or masses felt. Normal bowel sounds heard. Central nervous system: Alert and oriented. No focal neurological deficits. Extremities: Symmetric 5 x 5 power. Skin: No rashes, lesions or ulcers Psychiatry: Judgement and insight appear normal. Mood & affect appropriate.     Data Reviewed: I have personally reviewed following labs and imaging studies  CBC: Recent Labs  Lab 06/05/21 1525 06/06/21 0329  WBC 13.6* 11.5*  NEUTROABS 11.4*  --   HGB 13.4 11.9*  HCT 44.7 41.5  MCV 83.2 83.5  PLT 202 500   Basic Metabolic Panel: Recent Labs  Lab 06/05/21 1525 06/06/21 0329  NA 133* 130*  K 4.4 4.5  CL 95* 94*  CO2 29 29  GLUCOSE 104* 94  BUN 28* 27*  CREATININE 0.90 0.64  CALCIUM 8.4* 8.3*   GFR: Estimated Creatinine Clearance: 58.7 mL/min (  by C-G formula based on SCr of 0.64 mg/dL). Liver Function Tests: Recent Labs  Lab 06/05/21 1525  AST 17  ALT 10  ALKPHOS 71  BILITOT 0.8  PROT 7.0  ALBUMIN 3.1*   No results for input(s): LIPASE, AMYLASE in the last 168 hours. No results for input(s): AMMONIA in the last 168 hours. Coagulation Profile: No results for input(s): INR, PROTIME in the last 168 hours. Cardiac Enzymes: No results for input(s): CKTOTAL, CKMB, CKMBINDEX,  TROPONINI in the last 168 hours. BNP (last 3 results) No results for input(s): PROBNP in the last 8760 hours. HbA1C: No results for input(s): HGBA1C in the last 72 hours. CBG: No results for input(s): GLUCAP in the last 168 hours. Lipid Profile: No results for input(s): CHOL, HDL, LDLCALC, TRIG, CHOLHDL, LDLDIRECT in the last 72 hours. Thyroid Function Tests: No results for input(s): TSH, T4TOTAL, FREET4, T3FREE, THYROIDAB in the last 72 hours. Anemia Panel: No results for input(s): VITAMINB12, FOLATE, FERRITIN, TIBC, IRON, RETICCTPCT in the last 72 hours. Sepsis Labs: No results for input(s): PROCALCITON, LATICACIDVEN in the last 168 hours.  Recent Results (from the past 240 hour(s))  Resp Panel by RT-PCR (Flu A&B, Covid) Nasopharyngeal Swab     Status: None   Collection Time: 06/05/21  4:22 PM   Specimen: Nasopharyngeal Swab; Nasopharyngeal(NP) swabs in vial transport medium  Result Value Ref Range Status   SARS Coronavirus 2 by RT PCR NEGATIVE NEGATIVE Final    Comment: (NOTE) SARS-CoV-2 target nucleic acids are NOT DETECTED.  The SARS-CoV-2 RNA is generally detectable in upper respiratory specimens during the acute phase of infection. The lowest concentration of SARS-CoV-2 viral copies this assay can detect is 138 copies/mL. A negative result does not preclude SARS-Cov-2 infection and should not be used as the sole basis for treatment or other patient management decisions. A negative result may occur with  improper specimen collection/handling, submission of specimen other than nasopharyngeal swab, presence of viral mutation(s) within the areas targeted by this assay, and inadequate number of viral copies(<138 copies/mL). A negative result must be combined with clinical observations, patient history, and epidemiological information. The expected result is Negative.  Fact Sheet for Patients:  EntrepreneurPulse.com.au  Fact Sheet for Healthcare Providers:   IncredibleEmployment.be  This test is no t yet approved or cleared by the [place] FDA and  has been authorized for detection and/or diagnosis of SARS-CoV-2 by FDA under an Emergency Use Authorization (EUA). This EUA will remain  in effect (meaning this test can be used) for the duration of the COVID-19 declaration under Section 564(b)(1) of the Act, 21 U.S.C.section 360bbb-3(b)(1), unless the authorization is terminated  or revoked sooner.       Influenza A by PCR NEGATIVE NEGATIVE Final   Influenza B by PCR NEGATIVE NEGATIVE Final    Comment: (NOTE) The Xpert Xpress SARS-CoV-2/FLU/RSV plus assay is intended as an aid in the diagnosis of influenza from Nasopharyngeal swab specimens and should not be used as a sole basis for treatment. Nasal washings and aspirates are unacceptable for Xpert Xpress SARS-CoV-2/FLU/RSV testing.  Fact Sheet for Patients: EntrepreneurPulse.com.au  Fact Sheet for Healthcare Providers: IncredibleEmployment.be  This test is not yet approved or cleared by the [place] FDA and has been authorized for detection and/or diagnosis of SARS-CoV-2 by FDA under an Emergency Use Authorization (EUA). This EUA will remain in effect (meaning this test can be used) for the duration of the COVID-19 declaration under Section 564(b)(1) of the Act, 21 U.S.C. section 360bbb-3(b)(1),  unless the authorization is terminated or revoked.  Performed at [facility], [address], [place], [place] [address]          Radiology Studies: [facility] Chest [facility] 1 View  Result Date: 06/05/2021 CLINICAL DATA:  Chest pain since last night EXAM: PORTABLE CHEST 1 VIEW COMPARISON:  05/15/2019 FINDINGS: Gross cardiomegaly. Pulmonary vascular prominence and mild, diffuse interstitial opacity. No focal airspace opacity. The visualized skeletal structures are unremarkable. IMPRESSION: 1.  Gross cardiomegaly. 2. Pulmonary vascular  prominence and mild, diffuse interstitial opacity, likely edema. No focal airspace opacity. Electronically Signed   By: Delanna Ahmadi M.D.   On: 06/05/2021 15:46        Scheduled Meds:  aspirin EC  81 mg Oral Daily   atorvastatin  40 mg Oral Daily   metoprolol tartrate  12.5 mg Oral BID   pantoprazole  40 mg Oral QHS   potassium chloride  10 mEq Oral Daily   Continuous Infusions:  heparin 1,050 Units/hr (06/06/21 0418)   nitroGLYCERIN 55 mcg/min (06/06/21 0800)     LOS: 1 day    Time spent: 6mns    JKathie Dike MD [facility]   If 7PM-7AM, please contact night-coverage [email or web address]  06/06/2021, 9:19 AM

## 2021-06-06 NOTE — ED Notes (Signed)
Notified provider regarding pt's leg pain. ?

## 2021-06-06 NOTE — H&P (View-Only) (Signed)
°Cardiology Consultation:  ° °Patient ID: [name] °MRN: [id number]; DOB: 08/02/1945 ° °Admit date: 06/05/2021 °Date of Consult: 06/06/2021 ° °PCP:  Knowlton, Steve, MD °  °[facility] Providers °Cardiologist:  Paula Ross, MD  °Advanced Heart Failure:  Dalton McLean, MD     ° ° °Patient Profile:  ° °[name] is a 75 y.o. female with a hx of pulmonary HTN, RV dysfunction, COPD  who is being seen 06/06/2021 for the evaluation of chest pain  at the request of Dr Memon. ° °History of Present Illness:  ° °[name] 75 yo female history of pulmonary HTN followed in CHF clinic, severe RV dysfunction, COPD, HTN presents with chest pain. From ER notes initial EKG discussed with interventional on call Dr Smith, cancelled as possible STEMI, managed as NSTEMI.  ° ° °WBC 13.6 Hgb 13.4 Plt 202 K 4.4 Cr 0.90 BNP 869  °Trop 1331-->1633 °EKG upsloping ST elevation inferior, anteroseptal TWIs °COVID neg flu neg °CXR: probable edema °Echo pending ° ° °05/14/2021 echo: LVEF 60-65%, no WMAs, normal diastolic fxn, RV moderate dysfunction and moderately enlarged,  ° °Past Medical History:  °Diagnosis Date  ° Arthritis   ° CHF (congestive heart failure) ([facility])   ° Chronic back pain   ° COPD (chronic obstructive pulmonary disease) ([facility])   ° patient is on oxygen at night when needed  ° Depression   ° GERD (gastroesophageal reflux disease)   ° Heart failure ([facility])   ° RV  ° Hypertension   ° Neuropathy   ° Pulmonary HTN ([facility])   ° Thyroid disease   ° Ulcer   ° Billroth I  ° ° °Past Surgical History:  °Procedure Laterality Date  ° ABDOMINAL HYSTERECTOMY    ° BACK SURGERY    ° X4  ° BILROTH I PROCEDURE    ° BIOPSY  12/18/2019  ° Procedure: BIOPSY;  Surgeon: Rourk, Robert M, MD;  [facility];  Service: Endoscopy;;  ° BIOPSY  12/27/2020  ° Procedure: BIOPSY;  Surgeon: Rourk, Robert M, MD;  [facility];  Service: Endoscopy;;  ° CHOLECYSTECTOMY    ° COLONOSCOPY  05/18/2003  ° RMR:Incomplete colonoscopy/  Internal hemorrhoids.  Otherwise, normal rectum  ° COLONOSCOPY  08/12/2010  ° Dr. Jenkins: cecum visualized and normal, colon and rectum normal. Torturous colon  ° COLONOSCOPY N/A 09/07/2012  ° RMR: tubular adenoma, lipoma. Due for surveillance in 2021  ° COLONOSCOPY WITH PROPOFOL N/A 12/18/2019  ° Rourk: Three 2-8mm polyps removed from the descending colon, tubular adenomas.  Diverticulosis.  Noncompliant left colon.  ° ESOPHAGOGASTRODUODENOSCOPY  05/18/2003  ° RMR:. Normal esophagus/Adenomatous-appearing mucosa at the anastomosis  ° ESOPHAGOGASTRODUODENOSCOPY  08/12/2010  ° Dr. Jenkins: anastomosis widely patent, no ulcerations, CLO test negative  ° ESOPHAGOGASTRODUODENOSCOPY (EGD) WITH PROPOFOL N/A 12/18/2019  ° Rourk: Normal esophagus status post dilation.  Prior hemigastrectomy.  Small bowel nodule of uncertain significance.  Biopsy with mild reactive changes and focal intestinal metaplasia.  Recommend 1 year follow-up EGD.  ° ESOPHAGOGASTRODUODENOSCOPY (EGD) WITH PROPOFOL N/A 12/27/2020  ° Rourk:Normal esophagus s/p dilation, s/p hemigastrectomy, Billroth I configuration, anastomotic nodule biopsied, polypoid duodenal mucosa with surface gastric foveolar metaplasia s/p peptic injury, no malignancy  ° MALONEY DILATION N/A 12/18/2019  ° Procedure: MALONEY DILATION;  Surgeon: Rourk, Robert M, MD;  [facility];  Service: Endoscopy;  Laterality: N/A;  ° MALONEY DILATION N/A 12/27/2020  ° Procedure: MALONEY DILATION;  Surgeon: Rourk, Robert M, MD;  [facility];    Service: Endoscopy;  Laterality: N/A;   POLYPECTOMY  12/18/2019   Procedure: POLYPECTOMY;  Surgeon: Daneil Dolin, MD;  [facility];  Service: Endoscopy;;   RIGHT HEART CATH N/A 05/04/2018   Procedure: RIGHT HEART CATH;  Surgeon: Larey Dresser, MD;  Location: [place] [facility];  Service: Cardiovascular;  Laterality: N/A;   STOMACH SURGERY     removed partial stomach   YAG LASER APPLICATION Left 54/12/8117    Procedure: YAG LASER APPLICATION;  Surgeon: [name], MD;  Location: [facility];  Service: Ophthalmology;  Laterality: Left;   YAG LASER APPLICATION Right 14/78/2956   Procedure: YAG LASER APPLICATION;  Surgeon: [name], MD;  Location: [facility];  Service: Ophthalmology;  Laterality: Right;      Inpatient Medications: Scheduled Meds:  aspirin EC  81 mg Oral Daily   atorvastatin  40 mg Oral Daily   dapagliflozin propanediol  10 mg Oral Daily   furosemide  40 mg Oral Daily   gabapentin  300 mg Oral TID   metoprolol tartrate  12.5 mg Oral BID   pantoprazole  40 mg Oral QHS   potassium chloride  10 mEq Oral Daily   Continuous Infusions:  heparin 1,050 Units/hr (06/06/21 0418)   nitroGLYCERIN 55 mcg/min (06/06/21 0800)   PRN Meds: acetaminophen, albuterol, clonazePAM, doxylamine (Sleep), nitroGLYCERIN, ondansetron (ZOFRAN) IV  Allergies:    Allergies  Allergen Reactions   Chantix [Varenicline] Nausea And Vomiting   Codeine Other (See Comments)    hallucinations   Oxycodone Palpitations    Social History:   Social History   Socioeconomic History   Marital status: Married    Spouse name: Not on file   Number of children: Not on file   Years of education: Not on file   Highest education level: Not on file  Occupational History   Not on file  Tobacco Use   Smoking status: Every Day    Packs/day: 0.30    Years: 15.00    Pack years: 4.50    Types: Cigarettes   Smokeless tobacco: Never  Vaping Use   Vaping Use: Never used  Substance and Sexual Activity   Alcohol use: No   Drug use: No   Sexual activity: Not Currently  Other Topics Concern   Not on file  Social History Narrative   Not on file   Social Determinants of Health   Financial Resource Strain: Not on file  Food Insecurity: Not on file  Transportation Needs: Not on file  Physical Activity: Not on file  Stress: Not on file  Social Connections: Not on file  Intimate Partner Violence: Not on file     Family History:    Family History  Problem Relation Age of Onset   Cancer Sister        breast cancer   Cancer Brother 61       esophageal cancer   Cancer Maternal Aunt        pancreas?   Cancer Paternal Aunt        multiple aunts had cancer   Colon cancer Neg Hx      ROS:  Please see the history of present illness.   All other ROS reviewed and negative.     Physical Exam/Data:   Vitals:   06/06/21 0730 06/06/21 0800 06/06/21 0830 06/06/21 0930  BP: 124/67 124/78 124/67 135/78  Pulse: 61 63 67 63  Resp: _0 (!) 23  Temp:      TempSrc:  SpO2: 97% 96% 93% 97%  °Weight:      °Height:      ° ° °Intake/Output Summary (Last 24 hours) at 06/06/2021 0947 °Last data filed at 06/06/2021 0637 °Gross per 24 hour  °Intake 238.14 ml  °Output --  °Net 238.14 ml  ° °Last 3 Weights 06/05/2021 12/25/2020 12/18/2020  °Weight (lbs) 154 lb 5.2 oz 151 lb 151 lb  °Weight (kg) 70 kg 68.493 kg 68.493 kg  °   °Body mass index is 25.68 kg/m².  °General:  Well nourished, well developed, in no acute distress °HEENT: normal °Neck: no JVD °Vascular: No carotid bruits; Distal pulses 2+ bilaterally °Cardiac:  normal S1, S2; RRR; no murmur  °Lungs:  clear to auscultation bilaterally, no wheezing, rhonchi or rales  °Abd: soft, nontender, no hepatomegaly  °Ext: no edema °Musculoskeletal:  No deformities, BUE and BLE strength normal and equal °Skin: warm and dry  °Neuro:  CNs 2-12 intact, no focal abnormalities noted °Psych:  Normal affect  ° ° ° °Laboratory Data: ° °High Sensitivity Troponin:   °Recent Labs  °Lab 06/05/21 °1525 06/05/21 °1710 06/06/21 °0846  °TROPONINIHS 1,331* 1,633* 1,784*  °   °Chemistry °Recent Labs  °Lab 06/05/21 °1525 06/06/21 °0329  °NA 133* 130*  °K 4.4 4.5  °CL 95* 94*  °CO2 29 29  °GLUCOSE 104* 94  °BUN 28* 27*  °CREATININE 0.90 0.64  °CALCIUM 8.4* 8.3*  °GFRNONAA >60 >60  °ANIONGAP 9 7  °  °Recent Labs  °Lab 06/05/21 °1525  °PROT 7.0  °ALBUMIN 3.1*  °AST 17  °ALT 10  °ALKPHOS 71  °BILITOT 0.8   ° °Lipids No results for input(s): CHOL, TRIG, HDL, LABVLDL, LDLCALC, CHOLHDL in the last 168 hours.  °Hematology °Recent Labs  °Lab 06/05/21 °1525 06/06/21 °0329  °WBC 13.6* 11.5*  °RBC 5.37* 4.97  °HGB 13.4 11.9*  °HCT 44.7 41.5  °MCV 83.2 83.5  °MCH 25.0* 23.9*  °MCHC 30.0 28.7*  °RDW 20.1* 18.8*  °PLT 202 176  ° °Thyroid No results for input(s): TSH, FREET4 in the last 168 hours.  °BNP °Recent Labs  °Lab 06/05/21 °1710  °BNP 869.0*  °  °DDimer No results for input(s): DDIMER in the last 168 hours. ° ° °Radiology/Studies:  °DG Chest Port 1 View ° °Result Date: 06/05/2021 °CLINICAL DATA:  Chest pain since last night EXAM: PORTABLE CHEST 1 VIEW COMPARISON:  05/15/2019 FINDINGS: Gross cardiomegaly. Pulmonary vascular prominence and mild, diffuse interstitial opacity. No focal airspace opacity. The visualized skeletal structures are unremarkable. IMPRESSION: 1.  Gross cardiomegaly. 2. Pulmonary vascular prominence and mild, diffuse interstitial opacity, likely edema. No focal airspace opacity. Electronically Signed   By: Alex D Bibbey M.D.   On: 06/05/2021 15:46   ° ° °Assessment and Plan:  ° °1.NSTEMI °- trop up to 1600 without established peak. EKG upsloping ST elevation inferior and lateral precordial leads, anteroseptal TWIs.  °- medical therapy with ASA 81, atorva 40, hep gtt, lopressor 12.5 mg bid, nitro gtt. Consider ARB after cath. ° °- plan for transfer today to [place] for left heart cath. ° ° ° °2.Pulmonary HTN °- from CHF clinic notes out of proportion to lung diseas °- did not tolerate macietentan, started on tadalafil. Had angioedema so tadalafil stopped (unclear if could have been aldactone as well) °- did not tolerate tyvaso °- recently has not been on vasodilator therapy, followed closely in CHF clinic ° °Shared Decision Making/Informed Consent °The risks [stroke (1 in 1000), death (1 in 1000), kidney failure [  usually temporary] (1 in 500), bleeding (1 in 200), allergic reaction [possibly serious]  (1 in 200)], benefits (diagnostic support and management of coronary artery disease) and alternatives of a cardiac catheterization were discussed in detail with Ms. Obeirne and she is willing to proceed.  ° ° ° °Risk Assessment/Risk Scores:  °[id number]}   °TIMI Risk Score for Unstable Angina or Non-ST Elevation MI:   °The patient's TIMI risk score is 5, which indicates a 26% risk of all cause mortality, new or recurrent myocardial infarction or need for urgent revascularization in the next 14 days.{ ° ° °For questions or updates, please contact [facility] °Please consult [email or web address] for contact info under  ° ° °Signed, °Reeves Musick, MD  °06/06/2021 9:47 AM ° °

## 2021-06-06 NOTE — Progress Notes (Signed)
?  Echocardiogram ?2D Echocardiogram has been performed. ? ?[name] ?06/06/2021, 1:00 PM ?

## 2021-06-06 NOTE — Consult Note (Signed)
Cardiology Consultation:   Patient ID: [name] MRN: [id number]; DOB: Aug 17, 1945  Admit date: 06/05/2021 Date of Consult: 06/06/2021  PCP:  Lemmie Evens, MD   [facility] Providers Cardiologist:  Dorris Carnes, MD  Advanced Heart Failure:  Loralie Champagne, MD       Patient Profile:   [name] is a 76 y.o. female with a hx of pulmonary HTN, RV dysfunction, COPD  who is being seen 06/06/2021 for the evaluation of chest pain  at the request of Dr Roderic Palau.  History of Present Illness:   [name] 76 yo female history of pulmonary HTN followed in CHF clinic, severe RV dysfunction, COPD, HTN presents with chest pain. From ER notes initial EKG discussed with interventional on call Dr Tamala Julian, cancelled as possible STEMI, managed as NSTEMI.    WBC 13.6 Hgb 13.4 Plt 202 K 4.4 Cr 0.90 BNP 869  Trop 1331-->1633 EKG upsloping ST elevation inferior, anteroseptal TWIs COVID neg flu neg CXR: probable edema Echo pending   05/14/2021 echo: LVEF 60-65%, no WMAs, normal diastolic fxn, RV moderate dysfunction and moderately enlarged,   Past Medical History:  Diagnosis Date   Arthritis    CHF (congestive heart failure) (HCC)    Chronic back pain    COPD (chronic obstructive pulmonary disease) (HCC)    patient is on oxygen at night when needed   Depression    GERD (gastroesophageal reflux disease)    Heart failure (HCC)    RV   Hypertension    Neuropathy    Pulmonary HTN (HCC)    Thyroid disease    Ulcer    Billroth I    Past Surgical History:  Procedure Laterality Date   ABDOMINAL HYSTERECTOMY     BACK SURGERY     X4   BILROTH I PROCEDURE     BIOPSY  12/18/2019   Procedure: BIOPSY;  Surgeon: Daneil Dolin, MD;  [facility];  Service: Endoscopy;;   BIOPSY  12/27/2020   Procedure: BIOPSY;  Surgeon: Daneil Dolin, MD;  [facility];  Service: Endoscopy;;   CHOLECYSTECTOMY     COLONOSCOPY  05/18/2003   NLG:XQJJHERDEY colonoscopy/  Internal hemorrhoids.  Otherwise, normal rectum   COLONOSCOPY  08/12/2010   Dr. Arnoldo Morale: cecum visualized and normal, colon and rectum normal. Torturous colon   COLONOSCOPY N/A 09/07/2012   RMR: tubular adenoma, lipoma. Due for surveillance in 2021   COLONOSCOPY WITH PROPOFOL N/A 12/18/2019   Rourk: Three 2-48m polyps removed from the descending colon, tubular adenomas.  Diverticulosis.  Noncompliant left colon.   ESOPHAGOGASTRODUODENOSCOPY  05/18/2003   RMR:. Normal esophagus/Adenomatous-appearing mucosa at the anastomosis   ESOPHAGOGASTRODUODENOSCOPY  08/12/2010   Dr. JArnoldo Morale anastomosis widely patent, no ulcerations, CLO test negative   ESOPHAGOGASTRODUODENOSCOPY (EGD) WITH PROPOFOL N/A 12/18/2019   Rourk: Normal esophagus status post dilation.  Prior hemigastrectomy.  Small bowel nodule of uncertain significance.  Biopsy with mild reactive changes and focal intestinal metaplasia.  Recommend 1 year follow-up EGD.   ESOPHAGOGASTRODUODENOSCOPY (EGD) WITH PROPOFOL N/A 12/27/2020   Rourk:Normal esophagus s/p dilation, s/p hemigastrectomy, Billroth I configuration, anastomotic nodule biopsied, polypoid duodenal mucosa with surface gastric foveolar metaplasia s/p peptic injury, no malignancy   MALONEY DILATION N/A 12/18/2019   Procedure: MALONEY DILATION;  Surgeon: RDaneil Dolin MD;  [facility];  Service: Endoscopy;  Laterality: N/A;   MALONEY DILATION N/A 12/27/2020   Procedure: MVenia MinksDILATION;  Surgeon: RDaneil Dolin MD;  [facility];  Service: Endoscopy;  Laterality: N/A;   POLYPECTOMY  12/18/2019   Procedure: POLYPECTOMY;  Surgeon: Daneil Dolin, MD;  [facility];  Service: Endoscopy;;   RIGHT HEART CATH N/A 05/04/2018   Procedure: RIGHT HEART CATH;  Surgeon: Larey Dresser, MD;  Location: [place] [facility];  Service: Cardiovascular;  Laterality: N/A;   STOMACH SURGERY     removed partial stomach   YAG LASER APPLICATION Left 54/12/8117    Procedure: YAG LASER APPLICATION;  Surgeon: [name], MD;  Location: [facility];  Service: Ophthalmology;  Laterality: Left;   YAG LASER APPLICATION Right 14/78/2956   Procedure: YAG LASER APPLICATION;  Surgeon: [name], MD;  Location: [facility];  Service: Ophthalmology;  Laterality: Right;      Inpatient Medications: Scheduled Meds:  aspirin EC  81 mg Oral Daily   atorvastatin  40 mg Oral Daily   dapagliflozin propanediol  10 mg Oral Daily   furosemide  40 mg Oral Daily   gabapentin  300 mg Oral TID   metoprolol tartrate  12.5 mg Oral BID   pantoprazole  40 mg Oral QHS   potassium chloride  10 mEq Oral Daily   Continuous Infusions:  heparin 1,050 Units/hr (06/06/21 0418)   nitroGLYCERIN 55 mcg/min (06/06/21 0800)   PRN Meds: acetaminophen, albuterol, clonazePAM, doxylamine (Sleep), nitroGLYCERIN, ondansetron (ZOFRAN) IV  Allergies:    Allergies  Allergen Reactions   Chantix [Varenicline] Nausea And Vomiting   Codeine Other (See Comments)    hallucinations   Oxycodone Palpitations    Social History:   Social History   Socioeconomic History   Marital status: Married    Spouse name: Not on file   Number of children: Not on file   Years of education: Not on file   Highest education level: Not on file  Occupational History   Not on file  Tobacco Use   Smoking status: Every Day    Packs/day: 0.30    Years: 15.00    Pack years: 4.50    Types: Cigarettes   Smokeless tobacco: Never  Vaping Use   Vaping Use: Never used  Substance and Sexual Activity   Alcohol use: No   Drug use: No   Sexual activity: Not Currently  Other Topics Concern   Not on file  Social History Narrative   Not on file   Social Determinants of Health   Financial Resource Strain: Not on file  Food Insecurity: Not on file  Transportation Needs: Not on file  Physical Activity: Not on file  Stress: Not on file  Social Connections: Not on file  Intimate Partner Violence: Not on file     Family History:    Family History  Problem Relation Age of Onset   Cancer Sister        breast cancer   Cancer Brother 61       esophageal cancer   Cancer Maternal Aunt        pancreas?   Cancer Paternal Aunt        multiple aunts had cancer   Colon cancer Neg Hx      ROS:  Please see the history of present illness.   All other ROS reviewed and negative.     Physical Exam/Data:   Vitals:   06/06/21 0730 06/06/21 0800 06/06/21 0830 06/06/21 0930  BP: 124/67 124/78 124/67 135/78  Pulse: 61 63 67 63  Resp: _0 (!) 23  Temp:      TempSrc:  SpO2: 97% 96% 93% 97%  Weight:      Height:        Intake/Output Summary (Last 24 hours) at 06/06/2021 0947 Last data filed at 06/06/2021 6811 Gross per 24 hour  Intake 238.14 ml  Output --  Net 238.14 ml   Last 3 Weights 06/05/2021 12/25/2020 12/18/2020  Weight (lbs) 154 lb 5.2 oz 151 lb 151 lb  Weight (kg) 70 kg 68.493 kg 68.493 kg     Body mass index is 25.68 kg/m.  General:  Well nourished, well developed, in no acute distress HEENT: normal Neck: no JVD Vascular: No carotid bruits; Distal pulses 2+ bilaterally Cardiac:  normal S1, S2; RRR; no murmur  Lungs:  clear to auscultation bilaterally, no wheezing, rhonchi or rales  Abd: soft, nontender, no hepatomegaly  Ext: no edema Musculoskeletal:  No deformities, BUE and BLE strength normal and equal Skin: warm and dry  Neuro:  CNs 2-12 intact, no focal abnormalities noted Psych:  Normal affect     Laboratory Data:  High Sensitivity Troponin:   Recent Labs  Lab 06/05/21 1525 06/05/21 1710 06/06/21 0846  TROPONINIHS 1,331* 1,633* 1,784*     Chemistry Recent Labs  Lab 06/05/21 1525 06/06/21 0329  NA 133* 130*  K 4.4 4.5  CL 95* 94*  CO2 29 29  GLUCOSE 104* 94  BUN 28* 27*  CREATININE 0.90 0.64  CALCIUM 8.4* 8.3*  GFRNONAA >60 >60  ANIONGAP 9 7    Recent Labs  Lab 06/05/21 1525  PROT 7.0  ALBUMIN 3.1*  AST 17  ALT 10  ALKPHOS 71  BILITOT 0.8    Lipids No results for input(s): CHOL, TRIG, HDL, LABVLDL, LDLCALC, CHOLHDL in the last 168 hours.  Hematology Recent Labs  Lab 06/05/21 1525 06/06/21 0329  WBC 13.6* 11.5*  RBC 5.37* 4.97  HGB 13.4 11.9*  HCT 44.7 41.5  MCV 83.2 83.5  MCH 25.0* 23.9*  MCHC 30.0 28.7*  RDW 20.1* 18.8*  PLT 202 176   Thyroid No results for input(s): TSH, FREET4 in the last 168 hours.  BNP Recent Labs  Lab 06/05/21 1710  BNP 869.0*    DDimer No results for input(s): DDIMER in the last 168 hours.   Radiology/Studies:  DG Chest Port 1 View  Result Date: 06/05/2021 CLINICAL DATA:  Chest pain since last night EXAM: PORTABLE CHEST 1 VIEW COMPARISON:  05/15/2019 FINDINGS: Gross cardiomegaly. Pulmonary vascular prominence and mild, diffuse interstitial opacity. No focal airspace opacity. The visualized skeletal structures are unremarkable. IMPRESSION: 1.  Gross cardiomegaly. 2. Pulmonary vascular prominence and mild, diffuse interstitial opacity, likely edema. No focal airspace opacity. Electronically Signed   By: Delanna Ahmadi M.D.   On: 06/05/2021 15:46     Assessment and Plan:   1.NSTEMI - trop up to 1600 without established peak. EKG upsloping ST elevation inferior and lateral precordial leads, anteroseptal TWIs.  - medical therapy with ASA 81, atorva 40, hep gtt, lopressor 12.5 mg bid, nitro gtt. Consider ARB after cath.  - plan for transfer today to [facility] for left heart cath.    2.Pulmonary HTN - from CHF clinic notes out of proportion to lung diseas - did not tolerate macietentan, started on tadalafil. Had angioedema so tadalafil stopped (unclear if could have been aldactone as well) - did not tolerate tyvaso - recently has not been on vasodilator therapy, followed closely in CHF clinic  Shared Decision Making/Informed Consent The risks [stroke (1 in 1000), death (1 in 1000), kidney failure [  usually temporary] (1 in 500), bleeding (1 in 200), allergic reaction [possibly serious]  (1 in 200)], benefits (diagnostic support and management of coronary artery disease) and alternatives of a cardiac catheterization were discussed in detail with Ms. Caputo and she is willing to proceed.     Risk Assessment/Risk Scores:  [id number]}   TIMI Risk Score for Unstable Angina or Non-ST Elevation MI:   The patient's TIMI risk score is 5, which indicates a 26% risk of all cause mortality, new or recurrent myocardial infarction or need for urgent revascularization in the next 14 days.{   For questions or updates, please contact [place] Please consult [email or web address] for contact info under    Signed, Carlyle Dolly, MD  06/06/2021 9:47 AM

## 2021-06-06 NOTE — Interval H&P Note (Signed)
History and Physical Interval Note: ? ?06/06/2021 ?4:05 PM ? ?Winni JOELENE BARRIERE  has presented today for surgery, with the diagnosis of nstemi.  The various methods of treatment have been discussed with the patient and family. After consideration of risks, benefits and other options for treatment, the patient has consented to  Procedure(s): ?RIGHT/LEFT HEART CATH AND CORONARY ANGIOGRAPHY (N/A) as a surgical intervention.  The patient's history has been reviewed, patient examined, no change in status, stable for surgery.  I have reviewed the patient's chart and labs.  Questions were answered to the patient's satisfaction.   ?Cath Lab Visit (complete for each Cath Lab visit) ? ?Clinical Evaluation Leading to the Procedure:  ? ?ACS: Yes.   ? ?Non-ACS:   ? ?Anginal Classification: CCS III ? ?Anti-ischemic medical therapy: Minimal Therapy (1 class of medications) ? ?Non-Invasive Test Results: No non-invasive testing performed ? ?Prior CABG: No previous CABG ? ? ? ? ? ? ? ?[facility] ?06/06/2021 ?4:05 PM ? ? ? ?

## 2021-06-06 NOTE — ED Notes (Signed)
Echo at bedside

## 2021-06-06 NOTE — Progress Notes (Signed)
ANTICOAGULATION CONSULT NOTE  ? ?Pharmacy Consult for heparin gtt  ?Indication: chest pain/ACS ? ?Allergies  ?Allergen Reactions  ? Chantix [Varenicline] Nausea And Vomiting  ? Codeine Other (See Comments)  ?  hallucinations  ? Oxycodone Palpitations  ? ? ?Patient Measurements: ?Height: _0  (165.1 cm) ?Weight: 70 kg (154 lb 5.2 oz) ?IBW/kg (Calculated) : 57 ?Heparin Dosing Weight: HEPARIN DW (KG): 70 ? ? ?Vital Signs: ?Temp: 98.5 ?F (36.9 ?C) (03/02 2306) ?Temp Source: Oral (03/02 2306) ?BP: 121/72 (03/03 0130) ?Pulse Rate: 63 (03/03 0130) ? ?Labs: ?Recent Labs  ?  06/05/21 ?1525 06/06/21 ?0030  ?HGB 13.4  --   ?HCT 44.7  --   ?PLT 202  --   ?HEPARINUNFRC  --  <0.10*  ?CREATININE 0.90  --   ? ? ? ?Estimated Creatinine Clearance: 52.2 mL/min (by C-G formula based on SCr of 0.9 mg/dL). ? ? ?Medical History: ?Past Medical History:  ?Diagnosis Date  ? Arthritis   ? CHF (congestive heart failure) ([place])   ? Chronic back pain   ? COPD (chronic obstructive pulmonary disease) ([place])   ? patient is on oxygen at night when needed  ? Depression   ? GERD (gastroesophageal reflux disease)   ? Heart failure ([place])   ? RV  ? Hypertension   ? Neuropathy   ? Pulmonary HTN ([place])   ? Thyroid disease   ? Ulcer   ? Billroth I  ? ? ?Medications:  ?(Not in a hospital admission) ?Scheduled:  ? aspirin EC  81 mg Oral Daily  ? atorvastatin  40 mg Oral Daily  ? heparin      ? metoprolol tartrate  12.5 mg Oral BID  ? pantoprazole  40 mg Oral QHS  ? potassium chloride  10 mEq Oral Daily  ? ?Infusions:  ? heparin 800 Units/hr (06/06/21 0046)  ? nitroGLYCERIN 30 mcg/min (06/06/21 0105)  ? ?PRN:  ?Anti-infectives (From admission, onward)  ? ? None  ? ?  ? ? ?Assessment: ?[name] a 76 y.o. female requires anticoagulation with a heparin iv infusion for the indication of  chest pain/ACS. Heparin gtt will be started following pharmacy protocol per pharmacy consult.  ? ?Initial heparin level undetectable. No infusion or bleeding issues  noted. Will adjust rate.  ? ?Goal of Therapy:  ?Heparin level 0.3-0.7 units/ml ?Monitor platelets by anticoagulation protocol: Yes ?  ?Plan:  ?Rebolus heparin 4000 units x 1 then increase rate to 1050 units/hr ?Recheck heparin level in 8 hours ? ?Erin Hearing PharmD., BCPS ?Clinical Pharmacist ?06/06/2021 1:45 AM ? ?

## 2021-06-06 NOTE — ED Notes (Addendum)
Upon assessment patient in home clothes. Patient changed into hospital gown at this time. Attempted second IV access.  ?

## 2021-06-06 NOTE — Progress Notes (Signed)
Pt with HOB at 30 degrees, eating sandwich, O2 decreased to 80's, pulse ox replaced and placed on left hand, TR band remains in place to right wrist, RN asked pt if she wears O2 at homes, pt states she has it home, but doesn't usually wear it, pt placed on 2L [place], O2 increased to 92-95%, safety maintained ?

## 2021-06-07 ENCOUNTER — Inpatient Hospital Stay (HOSPITAL_COMMUNITY): Payer: Medicare Other

## 2021-06-07 DIAGNOSIS — Z720 Tobacco use: Secondary | ICD-10-CM

## 2021-06-07 DIAGNOSIS — J449 Chronic obstructive pulmonary disease, unspecified: Secondary | ICD-10-CM

## 2021-06-07 LAB — LIPID PANEL
Cholesterol: 138 mg/dL (ref 0–200)
HDL: 49 mg/dL (ref >40)
LDL Cholesterol: 71 mg/dL (ref 0–99)
Total CHOL/HDL Ratio: 2.8 RATIO
Triglycerides: 91 mg/dL (ref <150)
VLDL: 18 mg/dL (ref 0–40)

## 2021-06-07 LAB — HEMOGLOBIN A1C
Hgb A1c MFr Bld: 5.4 % (ref 4.8–5.6)
Mean Plasma Glucose: 108.28 mg/dL

## 2021-06-07 LAB — BASIC METABOLIC PANEL
Anion gap: 9 (ref 5–15)
BUN: 20 mg/dL (ref 8–23)
CO2: 31 mmol/L (ref 22–32)
Calcium: 8.9 mg/dL (ref 8.9–10.3)
Chloride: 94 mmol/L — ABNORMAL LOW (ref 98–111)
Creatinine, Ser: 0.75 mg/dL (ref 0.44–1.00)
GFR, Estimated: >60 mL/min (ref >60)
Glucose, Bld: 110 mg/dL — ABNORMAL HIGH (ref 70–99)
Potassium: 3.9 mmol/L (ref 3.5–5.1)
Sodium: 134 mmol/L — ABNORMAL LOW (ref 135–145)

## 2021-06-07 LAB — MAGNESIUM: Magnesium: 1.7 mg/dL (ref 1.7–2.4)

## 2021-06-07 LAB — CBC
HCT: 44.4 % (ref 36.0–46.0)
Hemoglobin: 13.1 g/dL (ref 12.0–15.0)
MCH: 24.5 pg — ABNORMAL LOW (ref 26.0–34.0)
MCHC: 29.5 g/dL — ABNORMAL LOW (ref 30.0–36.0)
MCV: 83 fL (ref 80.0–100.0)
Platelets: 217 10*3/uL (ref 150–400)
RBC: 5.35 MIL/uL — ABNORMAL HIGH (ref 3.87–5.11)
RDW: 19 % — ABNORMAL HIGH (ref 11.5–15.5)
WBC: 7.6 10*3/uL (ref 4.0–10.5)
nRBC: 0.3 % — ABNORMAL HIGH (ref 0.0–0.2)

## 2021-06-07 MED ORDER — ASPIRIN 81 MG PO TBEC: 81.0000 mg | DELAYED_RELEASE_TABLET | Freq: Every day | ORAL | 11 refills | Status: DC

## 2021-06-07 MED ORDER — DILTIAZEM HCL ER COATED BEADS 240 MG PO CP24: 240.0000 mg | ORAL_CAPSULE | Freq: Every day | ORAL | 11 refills | Status: DC

## 2021-06-07 MED ORDER — METOPROLOL TARTRATE 25 MG PO TABS: 12.5000 mg | ORAL_TABLET | Freq: Two times a day (BID) | ORAL | 6 refills | Status: DC

## 2021-06-07 MED ORDER — FUROSEMIDE 40 MG PO TABS: 40.0000 mg | ORAL_TABLET | Freq: Every day | ORAL | 11 refills | Status: DC

## 2021-06-07 MED ORDER — IOHEXOL 350 MG/ML SOLN
80.0000 mL | Freq: Once | INTRAVENOUS | Status: AC | PRN
  Administered 2021-06-07: 80 mL via INTRAVENOUS

## 2021-06-07 MED ORDER — NITROGLYCERIN 0.4 MG SL SUBL
SUBLINGUAL_TABLET | SUBLINGUAL | Status: AC
  Filled 2021-06-07: qty 1

## 2021-06-07 MED ORDER — IPRATROPIUM-ALBUTEROL 0.5-2.5 (3) MG/3ML IN SOLN
3.0000 mL | Freq: Two times a day (BID) | RESPIRATORY_TRACT | Status: DC
  Administered 2021-06-07: 3 mL via RESPIRATORY_TRACT
  Filled 2021-06-07: qty 3

## 2021-06-07 MED ORDER — SPIRONOLACTONE 25 MG PO TABS: 25.0000 mg | ORAL_TABLET | Freq: Every day | ORAL | 11 refills | Status: DC

## 2021-06-07 MED ORDER — ESCITALOPRAM OXALATE 10 MG PO TABS
10.0000 mg | ORAL_TABLET | Freq: Every day | ORAL | Status: DC
Start: 2021-06-07 — End: 2021-06-07

## 2021-06-07 MED ORDER — GABAPENTIN 300 MG PO CAPS
300.0000 mg | ORAL_CAPSULE | Freq: Three times a day (TID) | ORAL | Status: DC
  Administered 2021-06-07 (×2): 300 mg via ORAL
  Filled 2021-06-07 (×2): qty 1

## 2021-06-07 MED ORDER — NITROGLYCERIN 0.4 MG SL SUBL: 0.4000 mg | SUBLINGUAL_TABLET | SUBLINGUAL | 3 refills | Status: AC | PRN

## 2021-06-07 MED ORDER — POTASSIUM CHLORIDE ER 10 MEQ PO TBCR: EXTENDED_RELEASE_TABLET | ORAL | 11 refills | Status: DC

## 2021-06-07 MED ORDER — ATORVASTATIN CALCIUM 40 MG PO TABS: 40.0000 mg | ORAL_TABLET | Freq: Every day | ORAL | 11 refills | Status: AC

## 2021-06-07 NOTE — Progress Notes (Signed)
PT Cancellation Note ? ?Patient Details ?Name: [name] ?MRN: [id number] ?DOB: 1945/06/10 ? ? ?Cancelled Treatment:    Reason Eval/Treat Not Completed: Patient not medically ready. OT notified PT that pt is having unstable O2 sats and is planning to receive chest angio to rule out PE. Will follow-up later as time permits. ? ? ?Moishe Spice, PT, DPT ?Acute Rehabilitation Services  ?Pager: [phone] ?Office: [phone] ? ? ? ?Maretta Bees Pettis ?06/07/2021, 10:48 AM ? ? ?

## 2021-06-07 NOTE — Discharge Instructions (Signed)
PLEASE REMEMBER TO BRING ALL OF YOUR MEDICATIONS TO EACH OF YOUR FOLLOW-UP OFFICE VISITS. ? ?PLEASE ATTEND ALL SCHEDULED FOLLOW-UP APPOINTMENTS.  ? ?Activity: Increase activity slowly as tolerated. You may shower, but no soaking baths (or swimming) for 1 week. No driving for 2 days. No lifting over 5 lbs for 1 week. No sexual activity for 1 week.  ? ?You May Return to Work: in 1 week (if applicable) ? ?Wound Care: You may wash cath site gently with soap and water. Keep cath site clean and dry. If you notice pain, swelling, bleeding or pus at your cath site, please call [phone]. ? ? ? ?Cardiac Cath Site Care ?Refer to this sheet in the next few weeks. These instructions provide you with information on caring for yourself after your procedure. Your caregiver may also give you more specific instructions. Your treatment has been planned according to current medical practices, but problems sometimes occur. Call your caregiver if you have any problems or questions after your procedure. ?HOME CARE INSTRUCTIONS ?You may shower 24 hours after the procedure. Remove the bandage (dressing) and gently wash the site with plain soap and water. Gently pat the site dry.  ?Do not apply powder or lotion to the site.  ?Do not sit in a bathtub, swimming pool, or whirlpool for 5 to 7 days.  ?No bending, squatting, or lifting anything over 10 pounds (4.5 kg) as directed by your caregiver.  ?Inspect the site at least twice daily.  ?Do not drive home if you are discharged the same day of the procedure. Have someone else drive you.  ?You may drive 24 hours after the procedure unless otherwise instructed by your caregiver.  ?What to expect: ?Any bruising will usually fade within 1 to 2 weeks.  ?Blood that collects in the tissue (hematoma) may be painful to the touch. It should usually decrease in size and tenderness within 1 to 2 weeks.  ?SEEK IMMEDIATE MEDICAL CARE IF: ?You have unusual pain at the site or down the affected limb.  ?You  have redness, warmth, swelling, or pain at the site.  ?You have drainage (other than a small amount of blood on the dressing).  ?You have chills.  ?You have a fever or persistent symptoms for more than 72 hours.  ?You have a fever and your symptoms suddenly get worse.  ?Your leg becomes pale, cool, tingly, or numb.  ?You have heavy bleeding from the site. Hold pressure on the site.  ?Document Released: 04/25/2010 Document Revised: 03/12/2011 Document Reviewed: 04/25/2010 ?ExitCare? Patient Information ?[address], [place]. ? ?

## 2021-06-07 NOTE — Evaluation (Signed)
Physical Therapy Evaluation & Discharge ?Patient Details ?Name: [name] ?MRN: [id number] ?DOB: February 25, 1946 ?Today's Date: 06/07/2021 ? ?History of Present Illness ? This 76 y.o. female admitted 06/05/21 for CP and was found to have NSTEMI. Imaging negative for PE.  PMH Includes:  pulmonary HTN, COPD, RVF, tobacco use, chronic back pain, neuropathy, thyroid disease, ?  ?Clinical Impression ? Pt presents with condition above. Pt lives with her husband in a 1-level house with a ramped entrance. Pt appears to be functioning at her baseline, ambulating IND without UE support or LOB. She displays some very mild balance deficits when cued to obtain a momentary SLS. Pt also with aerobic endurance deficits, SpO2 decreasing to low 80s% on RA with mobility (needed 3L to rebound to 90s%) and RR in 30-40s with mobility. Educated pt she will need to use her supplemental O2 continuously now at home (was PRN PTA) and educated on using pulse ox to monitor and assist in weaning if needed. Pt and pt's son, who is a Therapist, sports, reported she had weaned herself in the past and knows how to do it safely. Encouraged pt to stop smoking, but reminded pt if she does smoke to ensure her SpO2 levels are adequate on RA and O2 tank is not nearby. All education completed and questions answered. PT will sign off. ?   ? ?Recommendations for follow up therapy are one component of a multi-disciplinary discharge planning process, led by the attending physician.  Recommendations may be updated based on patient status, additional functional criteria and insurance authorization. ? ?Follow Up Recommendations No PT follow up ? ?  ?Assistance Recommended at Discharge Intermittent Supervision/Assistance  ?Patient can return home with the following ? Help with stairs or ramp for entrance;Assistance with cooking/housework ? ?  ?Equipment Recommendations None recommended by PT  ?Recommendations for Other Services ?    ?  ?Functional Status Assessment Patient has  not had a recent decline in their functional status  ? ?  ?Precautions / Restrictions Precautions ?Precautions: Other (comment) ?Precaution Comments: watch SpO2 and RR ?Restrictions ?Weight Bearing Restrictions: No  ? ?  ? ?Mobility ? Bed Mobility ?Overal bed mobility: Independent ?  ?  ?  ?  ?  ?  ?General bed mobility comments: Pt able to perform all bed mob aspects without help ?  ? ?Transfers ?Overall transfer level: Independent ?Equipment used: None ?  ?  ?  ?  ?  ?  ?  ?General transfer comment: No LOB or assistance needed ?  ? ?Ambulation/Gait ?Ambulation/Gait assistance: Independent ?Gait Distance (Feet): 150 Feet ?Assistive device: None ?Gait Pattern/deviations: WFL(Within Functional Limits) ?Gait velocity: WNL ?Gait velocity interpretation: >4.37 ft/sec, indicative of normal walking speed ?  ?General Gait Details: Pt with steady gait, no LOB even with changing directions and head position and performing high knee marching. Did slow and hesitate a little with high knee marching ? ?Stairs ?  ?  ?  ?  ?  ? ?Wheelchair Mobility ?  ? ?Modified Rankin (Stroke Patients Only) ?  ? ?  ? ?Balance Overall balance assessment: Mild deficits observed, not formally tested (very mild) ?  ?  ?  ?  ?  ?  ?  ?  ?  ?  ?  ?  ?  ?  ?  ?  ?  ?  ?   ? ? ? ?Pertinent Vitals/Pain    ? ? ?Home Living Family/patient expects to be discharged to:: Private residence ?Living Arrangements: Spouse/significant other ?  Available Help at Discharge: Family;Available PRN/intermittently ?Type of Home: House ?Home Access: Stairs to enter;Ramped entrance (ramp option) ?  ?  ?  ?Home Layout: One level ?Home Equipment: Other (comment) (home 02 - uses PRN) ?Additional Comments: Pt reports she is primary caregiver for her spouse.  She reports her son is a Marine scientist and checks on them  ?  ?Prior Function Prior Level of Function : Independent/Modified Independent;Driving ?  ?  ?  ?  ?  ?  ?Mobility Comments: Pt with x1-2 falls rushing to restroom recently.   Reports she doesn't walk with AD ?ADLs Comments: Pt reports she is active in the community and at church.  She reports she is primary caregiver for spouse.  She reports she manages medications for both of them, and meds are pre-packaged in bubble packs ?  ? ? ?Hand Dominance  ? Dominant Hand: Right ? ?  ?Extremity/Trunk Assessment  ? Upper Extremity Assessment ?Upper Extremity Assessment: Defer to OT evaluation ?  ? ?Lower Extremity Assessment ?Lower Extremity Assessment: Overall WFL for tasks assessed ?  ? ?Cervical / Trunk Assessment ?Cervical / Trunk Assessment: Normal  ?Communication  ? Communication: No difficulties  ?Cognition Arousal/Alertness: Awake/alert ?Behavior During Therapy: [facility] for tasks assessed/performed ?Overall Cognitive Status: No family/caregiver present to determine baseline cognitive functioning ?  ?  ?  ?  ?  ?  ?  ?  ?  ?  ?  ?  ?  ?  ?  ?  ?General Comments: Pt tangential at times and needs redirecting, also seems to be forgetful at times - unsure if this is due to anxiety and desire to discharge, or if pt may have cognitive deficit ?  ?  ? ?  ?General Comments General comments (skin integrity, edema, etc.): SpO2 at rest on RA 90%, as low as 82% on RA with mobility, needing 3L to rebound to >/= 90%; educated pt to use her pulse ox to monitor her SpO2 and use O2 at all times currently until can wean self safely. Pt and pt's son who is a Therapist, sports report she has weaned herself in the past and knows how to do it safely; encouraged pt to not smoke but if she does to ensure no O2 tanks nearby and to rest for 5 min monitoring SPO2 on RA prior to smoking to ensure adequate sats; RR in 30-40s with mobility ? ?  ?Exercises    ? ?Assessment/Plan  ?  ?PT Assessment Patient does not need any further PT services  ?PT Problem List   ? ?   ?  ?PT Treatment Interventions     ? ?PT Goals (Current goals can be found in the Care Plan section)  ?Acute Rehab PT Goals ?Patient Stated Goal: to go home ?PT Goal  Formulation: All assessment and education complete, DC therapy ?Time For Goal Achievement: 06/08/21 ?Potential to Achieve Goals: Good ? ?  ?Frequency   ?  ? ? ?Co-evaluation   ?  ?  ?  ?  ? ? ?  ?AM-PAC PT "6 Clicks" Mobility  ?Outcome Measure Help needed turning from your back to your side while in a flat bed without using bedrails?: None ?Help needed moving from lying on your back to sitting on the side of a flat bed without using bedrails?: None ?Help needed moving to and from a bed to a chair (including a wheelchair)?: None ?Help needed standing up from a chair using your arms (e.g., wheelchair or bedside chair)?: None ?Help  needed to walk in hospital room?: None ?Help needed climbing 3-5 steps with a railing? : A Little ?6 Click Score: 23 ? ?  ?End of Session Equipment Utilized During Treatment: Oxygen ?Activity Tolerance: Patient tolerated treatment well ?Patient left: in bed;with call bell/phone within reach;with family/visitor present;with nursing/sitter in room ?Nurse Communication: Mobility status;Other (comment) (sats) ?PT Visit Diagnosis: Other (comment) (aerobic endurance deficits) ?  ? ?Time: 2257-5051 ?PT Time Calculation (min) (ACUTE ONLY): 18 min ? ? ?Charges:   PT Evaluation ?$PT Eval Moderate Complexity: 1 Mod ?  ?  ?   ? ? ?Moishe Spice, PT, DPT ?Acute Rehabilitation Services  ?Pager: [phone] ?Office: [phone] ? ? ?Maretta Bees Pettis ?06/07/2021, 4:33 PM ? ?

## 2021-06-07 NOTE — TOC Initial Note (Signed)
Transition of Care (TOC) - Initial/Assessment Note  ? ? ?Patient Details  ?Name: [name] ?MRN: [id number] ?Date of Birth: 07/13/1945 ? ?Transition of Care (TOC) CM/SW Contact:    ?Graves-Bigelow, Ocie Cornfield, RN ?Phone Number: ?06/07/2021, 1:40 PM ? ?Clinical Narrative:  Patient presented for the evaluation for chest pain. PTA patient was from home with spouse and support of son. Patient is currently on oxygen with [employer] at 2 Liters-increased liter flow to 4 Liters. Case Manager called [employer] and the office will call the patient to deliver a 10 liter concentrator to the home. Case Manager spoke with the patients son and he will bring a portable tank for transport. Son will transport home via private vehicle if the CT angio chest negative. No further home needs identified at this time. Case Manager will continue to follow for additional transition of care needs.  ? ? ?Expected Discharge Plan: Home/Self Care ?Barriers to Discharge: No Barriers Identified ? ? ?Patient Goals and CMS Choice ?Patient states their goals for this hospitalization and ongoing recovery are:: to return home ?  ?Choice offered to / list presented to : NA ? ?Expected Discharge Plan and Services ?Expected Discharge Plan: Home/Self Care ?In-house Referral: NA ?Discharge Planning Services: CM Consult ?Post Acute Care Choice: NA ?Living arrangements for the past 2 months: [place] ?Expected Discharge Date: 06/07/21               ?  ?DME Agency: NA ?  ?  ?  ?HH Arranged: NA ?  ?  ?  ?  ? ?Prior Living Arrangements/Services ?Living arrangements for the past 2 months: [place] ?Lives with:: Spouse ?Patient language and need for interpreter reviewed:: Yes ?Do you feel safe going back to the place where you live?: Yes      ?Need for Family Participation in Patient Care: Yes (Comment) ?Care giver support system in place?: Yes (comment) ?  ?Criminal Activity/Legal Involvement Pertinent to Current Situation/Hospitalization: No -  Comment as needed ? ?Activities of Daily Living ?Home Assistive Devices/Equipment: Eyeglasses ?ADL Screening (condition at time of admission) ?Patient's cognitive ability adequate to safely complete daily activities?: Yes ?Is the patient deaf or have difficulty hearing?: No ?Does the patient have difficulty seeing, even when wearing glasses/contacts?: Yes ?Does the patient have difficulty concentrating, remembering, or making decisions?: No ?Patient able to express need for assistance with ADLs?: Yes ?Does the patient have difficulty dressing or bathing?: No ?Independently performs ADLs?: Yes (appropriate for developmental age) ?Does the patient have difficulty walking or climbing stairs?: No ?Weakness of Legs: Right ?Weakness of Arms/Hands: None ? ?Permission Sought/Granted ?Permission sought to share information with : Case Manager, Family Supports, Customer service manager ?Permission granted to share information with : Yes, Verbal Permission Granted ?   ? Permission granted to share info w AGENCY: [employer] ?   ?   ? ?Emotional Assessment ?Appearance:: Appears stated age ?Attitude/Demeanor/Rapport: Engaged ?Affect (typically observed): Appropriate ?Orientation: : Oriented to  Time, Oriented to Situation, Oriented to Self, Oriented to Place ?Alcohol / Substance Use: Not Applicable ?Psych Involvement: No (comment) ? ?Admission diagnosis:  NSTEMI (non-ST elevated myocardial infarction) ([place]) [I21.4] ?Patient Active Problem List  ? Diagnosis Date Noted  ? NSTEMI (non-ST elevated myocardial infarction) ([place]) 06/05/2021  ? Tobacco use 06/05/2021  ? Pulmonary hypertension ([place]) 06/05/2021  ? RVF (right ventricular failure) ([place]) 06/05/2021  ? GERD (gastroesophageal reflux disease)   ? Duodenal nodule 03/18/2020  ? Esophageal dysphagia 10/20/2019  ? H/O adenomatous polyp  of colon 10/20/2019  ? COPD exacerbation ([place]) 07/26/2018  ? Abdominal pain, other specified site 08/25/2012  ? Loss of weight 08/25/2012  ? ?PCP:   Lemmie Evens, MD ?Pharmacy:   ?[place], [place] ?[place] [address] ?Phone: [phone] Fax: [phone] ? ?[place], [place] ?[address] ?[place] [address] ?Phone: [phone] Fax: [phone] ? ?Readmission Risk Interventions ?No flowsheet data found. ? ? ?

## 2021-06-07 NOTE — Evaluation (Addendum)
Occupational Therapy Evaluation ?Patient Details ?Name: [name] ?MRN: [id number] ?DOB: November 28, 1945 ?Today's Date: 06/07/2021 ? ? ?History of Present Illness This 76 y.o. female admitted for CP and was found to have NSTEMI.   PMH Includes:  pulmonary HTN, COPD, RVF, tobacco use, chronic back pain, neuropathy, thyroid disease,  ? ?Clinical Impression ?  ?Pt admitted with the above diagnosis and demonstrates the below listed deficits.  She is able to perform ADLs mod I.  Of note, her sp02 at rest on RA, 2L, and 4L was 79-84% without change/improvement with the increased 02 - no dyspnea noted.  Nelcor probed changed and changed finger in which is was monitored with the same results.   Sp02 increased to 98-100% during ambulation in hallway on 4L, but RR was consistently in the high 40s. RN and MD notified.   Pt lives with spouse, for whom she is primary caregiver.  She was fully independent PTA, and reports she has 02 at home that she uses, but was unable to tell me when she uses it.  At this time, she does not require continued skilled OT services and OT will sign off.   ?   ? ?Recommendations for follow up therapy are one component of a multi-disciplinary discharge planning process, led by the attending physician.  Recommendations may be updated based on patient status, additional functional criteria and insurance authorization.  ? ?Follow Up Recommendations ? No OT follow up  ?  ?Assistance Recommended at Discharge Intermittent Supervision/Assistance  ?Patient can return home with the following   ? ?  ?Functional Status Assessment ? Patient has had a recent decline in their functional status and demonstrates the ability to make significant improvements in function in a reasonable and predictable amount of time.  ?Equipment Recommendations ? None recommended by OT  ?  ?Recommendations for Other Services   ? ? ?  ?Precautions / Restrictions Precautions ?Precautions: None  ? ?  ? ?Mobility Bed Mobility ?Overal  bed mobility: Independent ?  ?  ?  ?  ?  ?  ?  ?  ? ?Transfers ?Overall transfer level: Independent ?  ?  ?  ?  ?  ?  ?  ?  ?  ?  ? ?  ?Balance   ?  ?  ?  ?  ?  ?  ?  ?  ?  ?  ?  ?  ?  ?  ?  ?  ?  ?  ?   ? ?ADL either performed or assessed with clinical judgement  ? ?ADL Overall ADL's : Modified independent ?  ?  ?  ?  ?  ?  ?  ?  ?  ?  ?  ?  ?  ?  ?  ?  ?  ?  ?  ?General ADL Comments: Pt with increased RR with activity - requires rest breaks  ? ? ? ?Vision   ?   ?   ?Perception   ?  ?Praxis   ?  ? ?Pertinent Vitals/Pain Pain Assessment ?Pain Assessment: No/denies pain  ? ? ? ?Hand Dominance Right ?  ?Extremity/Trunk Assessment Upper Extremity Assessment ?Upper Extremity Assessment: Overall WFL for tasks assessed ?  ?Lower Extremity Assessment ?Lower Extremity Assessment: Defer to PT evaluation ?  ?Cervical / Trunk Assessment ?Cervical / Trunk Assessment: Normal ?  ?Communication Communication ?Communication: No difficulties ?  ?Cognition Arousal/Alertness: Awake/alert ?Behavior During Therapy: [facility] for tasks assessed/performed ?Overall Cognitive Status: No family/caregiver present to  determine baseline cognitive functioning ?  ?  ?  ?  ?  ?  ?  ?  ?  ?  ?  ?  ?  ?  ?  ?  ?General Comments: Pt repeatedly asks same questions and makes same request evan after explanations provided - unsure if this is due to anxiety and desire to discharge, or if pt may have cognitive deficit ?  ?  ?General Comments  sp02 79-84% at rest on RA and on 2L (did not improve with 2L, nor with 4L).  Nelcor changed with same results - good Pleth.  Sp02 100% with ambulation in the hallway on 4L, but RR high 40s.  RN notified.  Pt left in bed with Sp02 87-93% on 2L - RN and MD notified. HR 60s ? ?  ?Exercises   ?  ?Shoulder Instructions    ? ? ?Home Living Family/patient expects to be discharged to:: Private residence ?Living Arrangements: Spouse/significant other ?Available Help at Discharge: Family;Available PRN/intermittently ?Type of Home:  House ?Home Access: Stairs to enter ?  ?  ?Home Layout: One level ?  ?  ?Bathroom Shower/Tub: Tub/shower unit (pt takes tub baths) ?  ?Bathroom Toilet: Standard ?  ?  ?Home Equipment: Other (comment) (home 02 - uses PRN) ?  ?Additional Comments: Pt reports she is primary caregiver for her spouse.  She reports her son is a Marine scientist and checks on them ?  ? ?  ?Prior Functioning/Environment Prior Level of Function : Independent/Modified Independent;Driving ?  ?  ?  ?  ?  ?  ?Mobility Comments: PT denies falls.  Reports she doesn't walk with AD ?ADLs Comments: Pt reports she is active in the community and at church.  She reports she is primary caregiver for spouse.  She reports she manages medications for both of them, and meds are pre-packaged in bubble packs ?  ? ?  ?  ?OT Problem List: Decreased activity tolerance;Cardiopulmonary status limiting activity ?  ?   ?OT Treatment/Interventions:    ?  ?OT Goals(Current goals can be found in the care plan section) Acute Rehab OT Goals ?Patient Stated Goal: to go home to see spouse ?OT Goal Formulation: All assessment and education complete, DC therapy  ?OT Frequency:   ?  ? ?Co-evaluation   ?  ?  ?  ?  ? ?  ?AM-PAC OT "6 Clicks" Daily Activity     ?Outcome Measure Help from another person eating meals?: None ?Help from another person taking care of personal grooming?: None ?Help from another person toileting, which includes using toliet, bedpan, or urinal?: None ?Help from another person bathing (including washing, rinsing, drying)?: None ?Help from another person to put on and taking off regular upper body clothing?: None ?Help from another person to put on and taking off regular lower body clothing?: None ?6 Click Score: 24 ?  ?End of Session Equipment Utilized During Treatment: Oxygen ?Nurse Communication: Mobility status ? ?Activity Tolerance: Other (comment) (limited by increased RR) ?Patient left: in bed;with call bell/phone within reach ? ?OT Visit Diagnosis: Other  (comment) (decreased activity tolerance)  ?              ?Time: 6568-1275 ?OT Time Calculation (min): 25 min ?Charges:  OT General Charges ?$OT Visit: 1 Visit ?OT Evaluation ?$OT Eval Moderate Complexity: 1 Mod ?OT Treatments ?$Therapeutic Activity: 8-22 mins ? ?Liam Bossman C., OTR/L ?Acute Rehabilitation Services ?Pager [phone] ?Office [phone] ? ? ?Martiza Speth M ?06/07/2021, 10:51 AM ?

## 2021-06-07 NOTE — Plan of Care (Signed)
?  Problem: Education: ?Goal: Knowledge of General Education information will improve ?Description: Including pain rating scale, medication(s)/side effects and non-pharmacologic comfort measures ?Outcome: Progressing ?  ?Problem: Health Behavior/Discharge Planning: ?Goal: Ability to manage health-related needs will improve ?Outcome: Progressing ?  ?Problem: Clinical Measurements: ?Goal: Ability to maintain clinical measurements within normal limits will improve ?Outcome: Progressing ?Goal: Will remain free from infection ?Outcome: Progressing ?Goal: Diagnostic test results will improve ?Outcome: Progressing ?Goal: Respiratory complications will improve ?Outcome: Progressing ?Goal: Cardiovascular complication will be avoided ?Outcome: Progressing ?  ?Problem: Activity: ?Goal: Risk for activity intolerance will decrease ?Outcome: Progressing ?  ?Problem: Nutrition: ?Goal: Adequate nutrition will be maintained ?Outcome: Progressing ?  ?Problem: Coping: ?Goal: Level of anxiety will decrease ?Outcome: Progressing ?  ?Problem: Elimination: ?Goal: Will not experience complications related to bowel motility ?Outcome: Progressing ?Goal: Will not experience complications related to urinary retention ?Outcome: Progressing ?  ?Problem: Pain Managment: ?Goal: General experience of comfort will improve ?Outcome: Progressing ?  ?Problem: Safety: ?Goal: Ability to remain free from injury will improve ?Outcome: Progressing ?  ?Problem: Skin Integrity: ?Goal: Risk for impaired skin integrity will decrease ?Outcome: Progressing ?  ?

## 2021-06-07 NOTE — Discharge Summary (Addendum)
Discharge Summary    Patient ID: [name] MRN: [id number]; DOB: [birth year]  Admit date: 06/05/2021 Discharge date: 06/07/2021  PCP:  Lemmie Evens, MD   [facility] Providers Cardiologist:  Dorris Carnes, MD  Advanced Heart Failure:  Loralie Champagne, MD       Discharge Diagnoses    Principal Problem:   NSTEMI (non-ST elevated myocardial infarction) [facility]) Active Problems:   COPD exacerbation ([place])   GERD (gastroesophageal reflux disease)   Tobacco use   Pulmonary hypertension ([place])   RVF (right ventricular failure) [facility])    Diagnostic Studies/Procedures    CARDIAC CATH: R/L3 06/2021   Prox RCA to Mid RCA lesion is 30% stenosed.   Dist LAD lesion is 25% stenosed.   LV end diastolic pressure is mildly elevated.   Hemodynamic findings consistent with moderate pulmonary hypertension.   Mild nonobstructive CAD Moderate pulmonary HTN. PAP mean 35 mm Hg. PVR 7 WU Mild elevation of LV filling pressures. PCWP mean 15 mm Hg. LVEDP 16 mm Hg Normal cardiac output. Index 2.83.    Plan: medical management. Pulmonary pressures have not changed significantly since prior right  heart cath in 2020.   ECHO: 06/06/2021  1. Left ventricular ejection fraction, by estimation, is 60 to 65%. The  left ventricle has normal function. The left ventricle has no regional  wall motion abnormalities. There is mild left ventricular hypertrophy.  Left ventricular diastolic parameters are consistent with Grade I diastolic dysfunction (impaired relaxation). Elevated left atrial pressure.   2. Ventricular septum is flattened in systole suggesting RV pressure  overload. . Right ventricular systolic function moderately to severely  reduced. The right ventricular size is moderately to severely dilated.  There is moderately elevated pulmonary artery systolic pressure, 11.9.  3. Left atrial size was moderately dilated.   4. Right atrial size was moderately dilated.   5. The mitral valve is normal  in structure. Trivial mitral valve  regurgitation. No evidence of mitral stenosis.   6. The tricuspid valve is abnormal. Tricuspid valve regurgitation is  moderate.   7. The aortic valve was not well visualized. There is mild calcification  of the aortic valve. There is mild thickening of the aortic valve. Aortic  valve regurgitation is not visualized. No aortic stenosis is present.   8. The inferior vena cava is dilated in size with <50% respiratory  variability, suggesting right atrial pressure of 15 mmHg.  _____________   History of Present Illness     [name] is a 76 y.o. female with COPD, HTN, pulmonary hypertension, RV dysfunction on echo, moderate-severe obstruction on PFTs, GERD, was admitted from [place] ER on 3/3 with chest pain, non-STEMI by enzymes  Hospital Course     Consultants: None  Her chest pain responded to nitrates.  Her cardiac enzymes were elevated.  She was transferred from [facility] to [facility] for cath.  Because of her history of pulmonary hypertension, she had a right and left heart cath.  No significant CAD was seen.  Her pulmonary pressures were moderately elevated.  Cardiac cath and echo results are above.  She had grade 1 diastolic dysfunction, there was RV pressure overload and RV systolic function moderately to severely reduced.  RV moderately-severely dilated.  PAS 46.6.  She had an episode of chest pain overnight that improved with sublingual nitroglycerin.  She will be given a prescription for nitro at discharge.  On 3/4, she was seen by Dr. Acie Fredrickson and all data were reviewed.  She was feeling well and had no further episodes of chest pain.    Because of her current cath numbers, it was felt that she should take the Lasix 3 times a week and as needed instead of just as needed.  Her potassium was decreased to where she only takes it with Lasix.  She is to take both on Mondays Wednesdays and Fridays as well as as needed.  She was  previously on potassium 10 mEq twice daily.  Because of your CAD albeit nonobstructive, she will be started on aspirin 81 mg daily and a statin.  The cause of her chest pain is not clear, but there is some concern for spasm, so she was given a prescription for nitroglycerin.  No further inpatient work-up was indicated and she is considered stable for discharge, to follow-up as an outpatient.  A message has been sent to the CHF schedulers to get her a follow-up appointment.  Did the patient have an acute coronary syndrome (MI, NSTEMI, STEMI, etc) this admission?:  No.   The elevated Troponin was due to the acute medical illness (demand ischemia) from pulmonary hypertension.  _____________  Discharge Vitals Blood pressure (!) 142/79, pulse 64, temperature 97.6 F (36.4 C), temperature source Oral, resp. rate 12, height _0  (1.651 m), weight 70 kg, SpO2 95 %.  Filed Weights   06/05/21 1530  Weight: 70 kg  General: Well developed, well nourished, female in no acute distress Head: Eyes PERRLA, Head normocephalic and atraumatic Lungs: Few scattered rales bilaterally to auscultation. Heart: HRRR S1 S2, without rub or gallop. No murmur. 4/4 extremity pulses are 2+ & equal. No JVD. Abdomen: Bowel sounds are present, abdomen soft and non-tender without masses or  hernias noted. Msk: Normal strength and tone for age. Extremities: No clubbing, cyanosis or edema.  Right radial cath site without ecchymosis or hematoma.  Slightly tender to palpation. Skin:  No rashes or lesions noted. Neuro: Alert and oriented X 3. Psych:  Good affect, responds appropriately   Labs & Radiologic Studies    CBC Recent Labs    06/05/21 1525 06/06/21 0329 06/06/21 1944 06/07/21 0324  WBC 13.6*   < > 9.3 7.6  NEUTROABS 11.4*  --   --   --   HGB 13.4   < > 12.6 13.1  HCT 44.7   < > 43.1 44.4  MCV 83.2   < > 82.4 83.0  PLT 202   < > 210 217   < > = values in this interval not displayed.   Basic Metabolic  Panel Recent Labs    06/06/21 0329 06/06/21 1944 06/07/21 0324 06/07/21 0823  NA 130*  --  134*  --   K 4.5  --  3.9  --   CL 94*  --  94*  --   CO2 29  --  31  --   GLUCOSE 94  --  110*  --   BUN 27*  --  20  --   CREATININE 0.64 0.69 0.75  --   CALCIUM 8.3*  --  8.9  --   MG  --   --   --  1.7   Liver Function Tests Recent Labs    06/05/21 1525  AST 17  ALT 10  ALKPHOS 71  BILITOT 0.8  PROT 7.0  ALBUMIN 3.1*   No results for input(s): LIPASE, AMYLASE in the last 72 hours. High Sensitivity Troponin:   Recent Labs  Lab 06/05/21 1525 06/05/21 1710  06/06/21 0846  TROPONINIHS 1,331* 1,633* 1,784*    BNP Invalid input(s): POCBNP D-Dimer No results for input(s): DDIMER in the last 72 hours. Hemoglobin A1C Recent Labs    06/07/21 0324  HGBA1C 5.4   Fasting Lipid Panel Recent Labs    06/07/21 0324  CHOL 138  HDL 49  LDLCALC 71  TRIG 91  CHOLHDL 2.8   Thyroid Function Tests No results for input(s): TSH, T4TOTAL, T3FREE, THYROIDAB in the last 72 hours.  Invalid input(s): FREET3 _____________  CARDIAC CATHETERIZATION  Result Date: 06/06/2021   Prox RCA to Mid RCA lesion is 30% stenosed.   Dist LAD lesion is 25% stenosed.   LV end diastolic pressure is mildly elevated.   Hemodynamic findings consistent with moderate pulmonary hypertension. Mild nonobstructive CAD Moderate pulmonary HTN. PAP mean 35 mm Hg. PVR 7 WU Mild elevation of LV filling pressures. PCWP mean 15 mm Hg. LVEDP 16 mm Hg Normal cardiac output. Index 2.83. Plan: medical management. Pulmonary pressures have not changed significantly since prior right  heart cath in 2020.   DG Chest Port 1 View  Result Date: 06/05/2021 CLINICAL DATA:  Chest pain since last night EXAM: PORTABLE CHEST 1 VIEW COMPARISON:  05/15/2019 FINDINGS: Gross cardiomegaly. Pulmonary vascular prominence and mild, diffuse interstitial opacity. No focal airspace opacity. The visualized skeletal structures are unremarkable.  IMPRESSION: 1.  Gross cardiomegaly. 2. Pulmonary vascular prominence and mild, diffuse interstitial opacity, likely edema. No focal airspace opacity. Electronically Signed   By: Delanna Ahmadi M.D.   On: 06/05/2021 15:46   ECHOCARDIOGRAM COMPLETE  Result Date: 06/06/2021    ECHOCARDIOGRAM REPORT   Patient Name:   [name] Date of Exam: 06/06/2021 Medical Rec #:  [id number]              Height:       65.0 in Accession #:    [id number]             Weight:       154.3 lb Date of Birth:  Feb 07, 1946              BSA:          1.772 m Patient Age:    76 years               BP:           139/79 mmHg Patient Gender: F                      HR:           60 bpm. Exam Location:  [facility] Procedure: 2D Echo, Cardiac Doppler, Color Doppler and Intracardiac            Opacification Agent Indications:    NSTEMI  History:        Patient has prior history of Echocardiogram examinations, most                 recent 05/14/2021. Pulmonary HTN and COPD; Risk Factors:Current                 Smoker and Hypertension. GERD.  Sonographer:    Clayton Lefort RDCS (AE) Referring Phys: [place]  1. Left ventricular ejection fraction, by estimation, is 60 to 65%. The left ventricle has normal function. The left ventricle has no regional wall motion abnormalities. There is mild left ventricular hypertrophy. Left ventricular diastolic parameters are consistent with Grade I diastolic dysfunction (impaired  relaxation). Elevated left atrial pressure.  2. Ventricular septum is flattened in systole suggesting RV pressure overload. . Right ventricular systolic function moderately to severely reduced. The right ventricular size is moderately to severely dilated. There is moderately elevated pulmonary artery systolic pressure.  3. Left atrial size was moderately dilated.  4. Right atrial size was moderately dilated.  5. The mitral valve is normal in structure. Trivial mitral valve regurgitation. No evidence of mitral  stenosis.  6. The tricuspid valve is abnormal. Tricuspid valve regurgitation is moderate.  7. The aortic valve was not well visualized. There is mild calcification of the aortic valve. There is mild thickening of the aortic valve. Aortic valve regurgitation is not visualized. No aortic stenosis is present.  8. The inferior vena cava is dilated in size with <50% respiratory variability, suggesting right atrial pressure of 15 mmHg. FINDINGS  Left Ventricle: Left ventricular ejection fraction, by estimation, is 60 to 65%. The left ventricle has normal function. The left ventricle has no regional wall motion abnormalities. Definity contrast agent was given IV to delineate the left ventricular  endocardial borders. The left ventricular internal cavity size was normal in size. There is mild left ventricular hypertrophy. Left ventricular diastolic parameters are consistent with Grade I diastolic dysfunction (impaired relaxation). Elevated left atrial pressure. Right Ventricle: Ventricular septum is flattened in systole suggesting RV pressure overload. The right ventricular size is moderately to severely dilated. Right vetricular wall thickness was not well visualized. Right ventricular systolic function moderately to severely reduced. There is moderately elevated pulmonary artery systolic pressure. The tricuspid regurgitant velocity is 2.81 m/s, and with an assumed right atrial pressure of 15 mmHg, the estimated right ventricular systolic pressure is 49.4 mmHg. Left Atrium: Left atrial size was moderately dilated. Right Atrium: Right atrial size was moderately dilated. Pericardium: There is no evidence of pericardial effusion. Mitral Valve: The mitral valve is normal in structure. Trivial mitral valve regurgitation. No evidence of mitral valve stenosis. MV peak gradient, 4.8 mmHg. The mean mitral valve gradient is 2.0 mmHg. Tricuspid Valve: The tricuspid valve is abnormal. Tricuspid valve regurgitation is moderate . No  evidence of tricuspid stenosis. Aortic Valve: The aortic valve was not well visualized. There is mild calcification of the aortic valve. There is mild thickening of the aortic valve. There is mild aortic valve annular calcification. Aortic valve regurgitation is not visualized. No aortic stenosis is present. Aortic valve mean gradient measures 4.0 mmHg. Aortic valve peak gradient measures 7.0 mmHg. Aortic valve area, by VTI measures 2.21 cm. Pulmonic Valve: The pulmonic valve was not well visualized. Pulmonic valve regurgitation is not visualized. No evidence of pulmonic stenosis. Aorta: The aortic root is normal in size and structure. Venous: The inferior vena cava is dilated in size with less than 50% respiratory variability, suggesting right atrial pressure of 15 mmHg. IAS/Shunts: No atrial level shunt detected by color flow Doppler.  LEFT VENTRICLE PLAX 2D LVIDd:         3.50 cm   Diastology LVIDs:         2.30 cm   LV e' medial:    3.92 cm/s LV PW:         1.20 cm   LV E/e' medial:  21.9 LV IVS:        1.20 cm   LV e' lateral:   6.64 cm/s LVOT diam:     1.90 cm   LV E/e' lateral: 12.9 LV SV:         57  LV SV Index:   32 LVOT Area:     2.84 cm  RIGHT VENTRICLE             IVC RV Basal diam:  4.60 cm     IVC diam: 2.30 cm RV Mid diam:    3.70 cm RV S prime:     10.30 cm/s TAPSE (M-mode): 2.1 cm LEFT ATRIUM             Index        RIGHT ATRIUM           Index LA diam:        3.20 cm 1.81 cm/m   RA Area:     21.80 cm LA Vol (A2C):   52.2 ml 29.46 ml/m  RA Volume:   71.80 ml  40.53 ml/m LA Vol (A4C):   95.6 ml 53.96 ml/m LA Biplane Vol: 75.8 ml 42.78 ml/m  AORTIC VALVE AV Area (Vmax):    2.28 cm AV Area (Vmean):   2.21 cm AV Area (VTI):     2.21 cm AV Vmax:           132.00 cm/s AV Vmean:          88.400 cm/s AV VTI:            0.257 m AV Peak Grad:      7.0 mmHg AV Mean Grad:      4.0 mmHg LVOT Vmax:         106.00 cm/s LVOT Vmean:        69.000 cm/s LVOT VTI:          0.200 m LVOT/AV VTI ratio: 0.78   AORTA Ao Root diam: 3.10 cm Ao Asc diam:  3.00 cm MITRAL VALVE                TRICUSPID VALVE MV Area (PHT): 2.17 cm     TR Peak grad:   31.6 mmHg MV Area VTI:   1.44 cm     TR Vmax:        281.00 cm/s MV Peak grad:  4.8 mmHg MV Mean grad:  2.0 mmHg     SHUNTS MV Vmax:       1.10 m/s     Systemic VTI:  0.20 m MV Vmean:      60.4 cm/s    Systemic Diam: 1.90 cm MV Decel Time: 349 msec MV E velocity: 85.70 cm/s MV A velocity: 102.00 cm/s MV E/A ratio:  0.84 Carlyle Dolly MD Electronically signed by Carlyle Dolly MD Signature Date/Time: 06/06/2021/5:29:41 PM    Final    ECHOCARDIOGRAM COMPLETE  Result Date: 05/14/2021    ECHOCARDIOGRAM REPORT   Patient Name:   [name] Date of Exam: 05/14/2021 Medical Rec #:  [id number]              Height:       65.0 in Accession #:    [id number]             Weight:       151.0 lb Date of Birth:  02-24-1946              BSA:          1.755 m Patient Age:    21 years               BP:           147/72 mmHg Patient Gender: F  HR:           86 bpm. Exam Location:  [facility] Procedure: 2D Echo, Cardiac Doppler and Color Doppler Indications:    F17.200 (ICD-10-CM) - Smoking  History:        Patient has prior history of Echocardiogram examinations, most                 recent 01/17/2020. COPD; Risk Factors:Hypertension and Current                 Smoker. Pulmonary HTN, GERD.  Sonographer:    Alvino Chapel RCS Referring Phys: [id number] Cole  1. Left ventricular ejection fraction, by estimation, is 60 to 65%. The left ventricle has normal function. The left ventricle has no regional wall motion abnormalities. There is mild left ventricular hypertrophy. Left ventricular diastolic parameters were normal.  2. Right ventricular systolic function is moderately reduced. The right ventricular size is moderately enlarged. There is moderately elevated pulmonary artery systolic pressure.  3. The pericardial effusion is posterior to the left  ventricle.  4. The mitral valve is normal in structure. No evidence of mitral valve regurgitation. No evidence of mitral stenosis.  5. Tricuspid valve regurgitation is mild to moderate.  6. The aortic valve is tricuspid. There is mild calcification of the aortic valve. There is mild thickening of the aortic valve. Aortic valve regurgitation is not visualized. Aortic valve sclerosis is present, with no evidence of aortic valve stenosis.  7. The inferior vena cava is dilated in size with >50% respiratory variability, suggesting right atrial pressure of 8 mmHg. FINDINGS  Left Ventricle: Left ventricular ejection fraction, by estimation, is 60 to 65%. The left ventricle has normal function. The left ventricle has no regional wall motion abnormalities. The left ventricular internal cavity size was normal in size. There is  mild left ventricular hypertrophy. Left ventricular diastolic parameters were normal. Right Ventricle: The right ventricular size is moderately enlarged. No increase in right ventricular wall thickness. Right ventricular systolic function is moderately reduced. There is moderately elevated pulmonary artery systolic pressure. The tricuspid  regurgitant velocity is 3.15 m/s, and with an assumed right atrial pressure of 8 mmHg, the estimated right ventricular systolic pressure is 64.3 mmHg. Left Atrium: Left atrial size was normal in size. Right Atrium: Right atrial size was normal in size. Pericardium: Trivial pericardial effusion is present. The pericardial effusion is posterior to the left ventricle. Mitral Valve: The mitral valve is normal in structure. No evidence of mitral valve regurgitation. No evidence of mitral valve stenosis. Tricuspid Valve: The tricuspid valve is normal in structure. Tricuspid valve regurgitation is mild to moderate. No evidence of tricuspid stenosis. Aortic Valve: The aortic valve is tricuspid. There is mild calcification of the aortic valve. There is mild thickening of the  aortic valve. Aortic valve regurgitation is not visualized. Aortic valve sclerosis is present, with no evidence of aortic valve stenosis. Pulmonic Valve: The pulmonic valve was normal in structure. Pulmonic valve regurgitation is not visualized. No evidence of pulmonic stenosis. Aorta: The aortic root is normal in size and structure. Venous: The inferior vena cava is dilated in size with greater than 50% respiratory variability, suggesting right atrial pressure of 8 mmHg. IAS/Shunts: No atrial level shunt detected by color flow Doppler.  LEFT VENTRICLE PLAX 2D LVIDd:         3.80 cm   Diastology LVIDs:         2.30 cm   LV e' medial:  5.33 cm/s LV PW:         1.70 cm   LV E/e' medial:  15.4 LV IVS:        1.20 cm   LV e' lateral:   6.31 cm/s LVOT diam:     1.60 cm   LV E/e' lateral: 13.0 LV SV:         60 LV SV Index:   34 LVOT Area:     2.01 cm  RIGHT VENTRICLE RV S prime:     11.90 cm/s TAPSE (M-mode): 2.2 cm LEFT ATRIUM             Index        RIGHT ATRIUM           Index LA diam:        3.40 cm 1.94 cm/m   RA Area:     17.40 cm LA Vol (A2C):   65.1 ml 37.09 ml/m  RA Volume:   44.80 ml  25.52 ml/m LA Vol (A4C):   53.1 ml 30.25 ml/m LA Biplane Vol: 67.7 ml 38.57 ml/m  AORTIC VALVE LVOT Vmax:   141.00 cm/s LVOT Vmean:  87.900 cm/s LVOT VTI:    0.299 m  AORTA Ao Root diam: 2.80 cm MITRAL VALVE                TRICUSPID VALVE MV Area (PHT): 2.29 cm     TR Peak grad:   39.7 mmHg MV Decel Time: 331 msec     TR Vmax:        315.00 cm/s MV E velocity: 82.30 cm/s MV A velocity: 106.00 cm/s  SHUNTS MV E/A ratio:  0.78         Systemic VTI:  0.30 m                             Systemic Diam: 1.60 cm Jenkins Rouge MD Electronically signed by Jenkins Rouge MD Signature Date/Time: 05/14/2021/2:23:18 PM    Final    Disposition   Pt is being discharged home today in good condition.  Follow-up Plans & Appointments     Follow-up Information     Larey Dresser, MD Follow up.   Specialty: Cardiology Why: The  office will call. Contact information: [place] [address] [phone]                Discharge Instructions     Call MD for:  redness, tenderness, or signs of infection (pain, swelling, redness, odor or green/yellow discharge around incision site)   Complete by: As directed    Diet - low sodium heart healthy   Complete by: As directed    Increase activity slowly   Complete by: As directed        Discharge Medications   Allergies as of 06/07/2021       Reactions   Chantix [varenicline] Nausea And Vomiting   Codeine Other (See Comments)   hallucinations   Oxycodone Palpitations        Medication List     STOP taking these medications    dapagliflozin propanediol 10 MG Tabs tablet Commonly known as: Farxiga       TAKE these medications    albuterol (2.5 MG/3ML) 0.083% nebulizer solution Commonly known as: PROVENTIL Take 2.5 mg by nebulization in the morning and at bedtime.   amLODipine 10 MG tablet Commonly known as: NORVASC Take 1 tablet (10  mg total) by mouth daily.   aspirin 81 MG EC tablet Take 1 tablet (81 mg total) by mouth daily. Swallow whole. Start taking on: June 08, 2021   atorvastatin 40 MG tablet Commonly known as: LIPITOR Take 1 tablet (40 mg total) by mouth daily. Start taking on: June 08, 2021   clonazePAM 1 MG tablet Commonly known as: KLONOPIN Take 0.5-1 mg by mouth 4 (four) times daily as needed for anxiety.   Combivent Respimat 20-100 MCG/ACT Aers respimat Generic drug: Ipratropium-Albuterol Inhale 1 puff into the lungs in the morning and at bedtime.   doxylamine (Sleep) 25 MG tablet Commonly known as: UNISOM Take 25 mg by mouth at bedtime as needed.   escitalopram 10 MG tablet Commonly known as: LEXAPRO Take 10 mg by mouth at bedtime.   furosemide 40 MG tablet Commonly known as: LASIX Take 1 tablet (40 mg total) by mouth daily. 1 tab on Monday Wednesday and Friday plus other days as needed for  weight gain or edema What changed: additional instructions   gabapentin 300 MG capsule Commonly known as: NEURONTIN Take 300 mg by mouth 3 (three) times daily.   HYDROcodone-acetaminophen 10-325 MG tablet Commonly known as: NORCO Take 1 tablet by mouth every 4 (four) hours as needed (severe back pain.).   metoprolol tartrate 25 MG tablet Commonly known as: LOPRESSOR Take 0.5 tablets (12.5 mg total) by mouth 2 (two) times daily.   nitroGLYCERIN 0.4 MG SL tablet Commonly known as: Nitrostat Place 1 tablet (0.4 mg total) under the tongue every 5 (five) minutes as needed for chest pain.   pantoprazole 40 MG tablet Commonly known as: PROTONIX Take 40 mg by mouth at bedtime.   potassium chloride 10 MEQ tablet Commonly known as: KLOR-CON Take 1 tab when you take the furosemide/Lasix tablet What changed: additional instructions           Outstanding Labs/Studies   None   Duration of Discharge Encounter   Greater than 30 minutes including physician time.  Signed, Rosaria Ferries, PA-C 06/07/2021, 9:53 AM   Attending Note:   The patient was seen and examined.  Agree with assessment and plan as noted above.  Changes made to the above note as needed.  Patient seen and independently examined with Rhnoda Barrett, PA .   We discussed all aspects of the encounter. I agree with the assessment and plan as stated above.     Chest pain :   has mild CAD by cath .   Persistently elevated pulmonary HTN.    Metoprolol 12.5 BID has been added to see if this helps  2.  Pulmnary HTN:   she still smokes.   I've strongly advised her to stop smoking as I think this is her main problem.   Cont current meds  Will have follow up closely with Dr. Aundra Dubin and the CHF team .     I have spent a total of 40 minutes with patient reviewing hospital  notes , telemetry, EKGs, labs and examining patient as well as establishing an assessment and plan that was discussed with the patient.  > 50% of time  was spent in direct patient care.    Thayer Headings, Brooke Bonito., MD, [facility] 06/07/2021, 10:00 AM [address]  [address] Office [phone] Pager [phone]  Addendum:  When she ambulated, she became hypoxic - and remained hypoxic In the setting of + troponins, mild CAD at cath,  I think we need to make sure  we have ruled out pulmonary embolus Will get a CTA of the chest to look for PE.   Mertie Moores, MD  06/07/2021 10:52 AM    [address],  [place], [place]  [address] Phone: [phone]; Fax: [phone]

## 2021-06-07 NOTE — Progress Notes (Signed)
SATURATION QUALIFICATIONS: (This note is used to comply with regulatory documentation for home oxygen) ? ?Patient Saturations on Room Air at Rest = 84% ? ?Patient Saturations on Room Air while Ambulating = 84% ? ?Patient Saturations on 4 Liters of oxygen while Ambulating = 100% ? ?Please briefly explain why patient needs home oxygen: ?

## 2021-06-07 NOTE — Progress Notes (Signed)
Patient stated that she is having 7/10 chest pain. EKG performed and vital signs are within normal limits. Two nitroglycerin tablets have been given to patient. Patient stated that she feels better and pain has gone down to 5/10. MD have been notified. ?

## 2021-06-07 NOTE — Progress Notes (Signed)
PROGRESS NOTE    [name]  PZW:[id number] DOB: 11-19-1945 DOA: 06/05/2021 PCP: Lemmie Evens, MD    Chief Complaint  Patient presents with   Chest Pain    Brief Narrative:  76 y/o female with history of COPD, pulm HTN/RV failure, admitted to the hospital with chest pain. Trop elevation consistent with NSTEMI. Discussed with cardiology and started on heparin/nitro infusions.  Patient transferred to [facility] for cardiac catheterization per cardiology.     Assessment & Plan:   Principal Problem:   NSTEMI (non-ST elevated myocardial infarction) ([place]) Active Problems:   Pulmonary hypertension ([place])   RVF (right ventricular failure) ([place])   Tobacco use   GERD (gastroesophageal reflux disease)   Chronic obstructive pulmonary disease ([place])  NSTEMI -Patient had presented to [facility] with complaints of chest pain, noted to have elevated troponins, concern for non-STEMI.   -Patient seen in consultation by cardiology and cardiac catheterization recommended and as such patient transferred to [facility]   -Patient placed on a heparin drip, nitroglycerin drip and seen by cardiology during the hospitalization.   -2D echo done with a EF of 60 to 65%, NWMA, grade 1 diastolic dysfunction, ventricular septum flattened in systole suggesting RV pressure overload, right ventricular systolic function moderately to severely reduced, right ventricular size moderately to severely dilated, moderately dilated left atrial size, moderately dilated right atrial size.   -Patient maintained on aspirin, statin, Lopressor, Lasix.   -Patient seen by cardiology underwent cardiac catheterization on 06/06/2021 that showed mild nonobstructive CAD, moderate pulmonary hypertension, mildly elevated LV filling pressures, normal cardiac output. -Cardiology recommended ongoing medical management as it was noted that pulmonary pressures have not changed significantly since prior right heart  cath in 2020. -CT angiogram chest which was done was negative for PE. -Patient noted to be hypoxic on ambulation will be discharged home on chronic home O2. -Patient was discharged home in stable and improved condition, remained chest pain-free and will follow-up with cardiology in the outpatient setting.   Pulmonary hypertension with RV failure -followed by Dr. Aundra Dubin -chronically on lasix, but takes at home as needed -she is not on aldactone due to possible angioedema in the past -she is on dapagliflozin per home med rec. -CT angiogram chest done negative for PE. -Patient was discharged home on home O2 continuously. -Outpatient follow-up with heart failure clinic, Dr. Aundra Dubin.   COPD -no shortness of breath at this time -Patient noted to have ongoing hypoxia on ambulation however remained asymptomatic. -Due to presentation with elevated troponins, mild coronary artery disease on cath CT angiogram was obtained which was negative for PE. -Patient will be discharged home on home O2. -Patient maintained on bronchodilators.   GERD Continue PPI   Tobacco use -Tobacco cessation stressed to patient.   Hypoxia -Patient noted by therapy to remain hypoxic on ambulation however denies any significant shortness of breath or chest pain. -Patient was admitted with elevated troponins concerning for non-STEMI, patient underwent cardiac catheterization with mild CAD disease and medical management recommended. -Due to elevated troponins, ongoing hypoxia, history of pulmonary hypertension CT angiogram ordered per cardiology which was negative for PE. -Continues home O2 4 L nasal cannula ordered per patient on discharge. -Outpatient follow-up with PCP and cardiology.     DVT prophylaxis: Heparin Code Status: Full Family Communication: Updated patient and daughter-in-law at bedside. Disposition:   Status is: Inpatient Remains inpatient appropriate because: Severity of illness            Consultants:  Cardiology: Dr. Harl Bowie 06/06/2021  Procedures:  CT angiogram chest 06/07/2021 Cardiac catheterization 06/06/2021, per Dr. Peter Martinique  Antimicrobials:  None   Subjective: Sitting up on the side of the bed.  Tearful.  Sats ranging from 82-91 on room air.  Denies any chest pain.  Denies any significant shortness of breath.  Was hoping to be able to go home today.  Daughter-in-law at bedside.  Objective: Vitals:   06/06/21 2354 06/07/21 0315 06/07/21 0410 06/07/21 0740  BP: 122/73 124/78 122/72 (!) 142/79  Pulse: 61 (!) 57 (!) 52 64  Resp: _0 Temp: (!) 97 F (36.1 C) 97.8 F (36.6 C) 97.7 F (36.5 C) 97.6 F (36.4 C)  TempSrc: Axillary Oral Oral Oral  SpO2: 99% 100% 98% 95%  Weight:      Height:       No intake or output data in the 24 hours ending 06/07/21 1612  Filed Weights   06/05/21 1530  Weight: 70 kg    Examination:  General exam: Appears calm and comfortable  Respiratory system: Clear to auscultation.  No wheezes, no crackles, no rhonchi.  Fair air movement.  Speaking in full sentences.  Respiratory effort normal. Cardiovascular system: S1 & S2 heard, RRR. No JVD, murmurs, rubs, gallops or clicks. No pedal edema. Gastrointestinal system: Abdomen is nondistended, soft and nontender. No organomegaly or masses felt. Normal bowel sounds heard. Central nervous system: Alert and oriented. No focal neurological deficits. Extremities: Symmetric 5 x 5 power. Skin: No rashes, lesions or ulcers Psychiatry: Judgement and insight appear normal. Mood & affect appropriate.     Data Reviewed: I have personally reviewed following labs and imaging studies  CBC: Recent Labs  Lab 06/05/21 1525 06/06/21 0329 06/06/21 1944 06/07/21 0324  WBC 13.6* 11.5* 9.3 7.6  NEUTROABS 11.4*  --   --   --   HGB 13.4 11.9* 12.6 13.1  HCT 44.7 41.5 43.1 44.4  MCV 83.2 83.5 82.4 83.0  PLT 202 176 210 952    Basic Metabolic Panel: Recent Labs  Lab 06/05/21 1525  06/06/21 0329 06/06/21 1944 06/07/21 0324 06/07/21 0823  NA 133* 130*  --  134*  --   K 4.4 4.5  --  3.9  --   CL 95* 94*  --  94*  --   CO2 29 29  --  31  --   GLUCOSE 104* 94  --  110*  --   BUN 28* 27*  --  20  --   CREATININE 0.90 0.64 0.69 0.75  --   CALCIUM 8.4* 8.3*  --  8.9  --   MG  --   --   --   --  1.7    GFR: Estimated Creatinine Clearance: 58.7 mL/min (by C-G formula based on SCr of 0.75 mg/dL).  Liver Function Tests: Recent Labs  Lab 06/05/21 1525  AST 17  ALT 10  ALKPHOS 71  BILITOT 0.8  PROT 7.0  ALBUMIN 3.1*    CBG: No results for input(s): GLUCAP in the last 168 hours.   Recent Results (from the past 240 hour(s))  Resp Panel by RT-PCR (Flu A&B, Covid) Nasopharyngeal Swab     Status: None   Collection Time: 06/05/21  4:22 PM   Specimen: Nasopharyngeal Swab; Nasopharyngeal(NP) swabs in vial transport medium  Result Value Ref Range Status   SARS Coronavirus 2 by RT PCR NEGATIVE NEGATIVE Final    Comment: (NOTE) SARS-CoV-2 target nucleic acids are NOT DETECTED.  The SARS-CoV-2 RNA is generally detectable in upper respiratory specimens during the acute phase of infection. The lowest concentration of SARS-CoV-2 viral copies this assay can detect is 138 copies/mL. A negative result does not preclude SARS-Cov-2 infection and should not be used as the sole basis for treatment or other patient management decisions. A negative result may occur with  improper specimen collection/handling, submission of specimen other than nasopharyngeal swab, presence of viral mutation(s) within the areas targeted by this assay, and inadequate number of viral copies(<138 copies/mL). A negative result must be combined with clinical observations, patient history, and epidemiological information. The expected result is Negative.  Fact Sheet for Patients:  EntrepreneurPulse.com.au  Fact Sheet for Healthcare Providers:   IncredibleEmployment.be  This test is no t yet approved or cleared by the [place] FDA and  has been authorized for detection and/or diagnosis of SARS-CoV-2 by FDA under an Emergency Use Authorization (EUA). This EUA will remain  in effect (meaning this test can be used) for the duration of the COVID-19 declaration under Section 564(b)(1) of the Act, 21 U.S.C.section 360bbb-3(b)(1), unless the authorization is terminated  or revoked sooner.       Influenza A by PCR NEGATIVE NEGATIVE Final   Influenza B by PCR NEGATIVE NEGATIVE Final    Comment: (NOTE) The Xpert Xpress SARS-CoV-2/FLU/RSV plus assay is intended as an aid in the diagnosis of influenza from Nasopharyngeal swab specimens and should not be used as a sole basis for treatment. Nasal washings and aspirates are unacceptable for Xpert Xpress SARS-CoV-2/FLU/RSV testing.  Fact Sheet for Patients: EntrepreneurPulse.com.au  Fact Sheet for Healthcare Providers: IncredibleEmployment.be  This test is not yet approved or cleared by the [place] FDA and has been authorized for detection and/or diagnosis of SARS-CoV-2 by FDA under an Emergency Use Authorization (EUA). This EUA will remain in effect (meaning this test can be used) for the duration of the COVID-19 declaration under Section 564(b)(1) of the Act, 21 U.S.C. section 360bbb-3(b)(1), unless the authorization is terminated or revoked.  Performed at [facility], [address] [place], [place] [address]          Radiology Studies: CT Angio Chest Pulmonary Embolism (PE) W or WO Contrast  Result Date: 06/07/2021 CLINICAL DATA:  Chest pain, pulmonary embolism suspected. EXAM: CT ANGIOGRAPHY CHEST WITH CONTRAST TECHNIQUE: Multidetector CT imaging of the chest was performed using the standard protocol during bolus administration of intravenous contrast. Multiplanar CT image reconstructions and MIPs were  obtained to evaluate the vascular anatomy. RADIATION DOSE REDUCTION: This exam was performed according to the departmental dose-optimization program which includes automated exposure control, adjustment of the mA and/or kV according to patient size and/or use of iterative reconstruction technique. CONTRAST:  78m OMNIPAQUE IOHEXOL 350 MG/ML SOLN COMPARISON:  Chest radiograph dated June 05, 2021 FINDINGS: Cardiovascular: Satisfactory opacification of the pulmonary arteries to the segmental level. No evidence of pulmonary embolism. Normal heart size. No pericardial effusion. Three-vessel scattered coronary artery atherosclerotic calcifications. Atherosclerotic calcification of aortic arch. Reflux of contrast into the hepatic veins concerning for pulmonary arterial hypertension. Mediastinum/Nodes: No enlarged mediastinal, hilar, or axillary lymph nodes. Thyroid gland, trachea, and esophagus demonstrate no significant findings. Lungs/Pleura: Lungs are clear. Small left pleural effusion. No evidence of pneumonia or pulmonary edema. Upper Abdomen: No acute abnormality. Musculoskeletal: No chest wall abnormality. No acute or significant osseous findings. Review of the MIP images confirms the above findings. IMPRESSION: 1.  No evidence of pulmonary embolism. 2. Main pulmonary trunk and bilateral pulmonary arteries  are within upper normal limits with reflux of contrast into the hepatic veins concerning for pulmonary arterial hypertension. 3. Small left pleural effusion. No evidence of pneumonia or pulmonary edema. Aortic Atherosclerosis (ICD10-I70.0). Electronically Signed   By: Keane Police D.O.   On: 06/07/2021 14:50   CARDIAC CATHETERIZATION  Result Date: 06/06/2021   Prox RCA to Mid RCA lesion is 30% stenosed.   Dist LAD lesion is 25% stenosed.   LV end diastolic pressure is mildly elevated.   Hemodynamic findings consistent with moderate pulmonary hypertension. Mild nonobstructive CAD Moderate pulmonary HTN. PAP mean  35 mm Hg. PVR 7 WU Mild elevation of LV filling pressures. PCWP mean 15 mm Hg. LVEDP 16 mm Hg Normal cardiac output. Index 2.83. Plan: medical management. Pulmonary pressures have not changed significantly since prior right  heart cath in 2020.   ECHOCARDIOGRAM COMPLETE  Result Date: 06/06/2021    ECHOCARDIOGRAM REPORT   Patient Name:   [name] Date of Exam: 06/06/2021 Medical Rec #:  [id number]              Height:       65.0 in Accession #:    [id number]             Weight:       154.3 lb Date of Birth:  1945/06/19              BSA:          1.772 m Patient Age:    26 years               BP:           139/79 mmHg Patient Gender: F                      HR:           60 bpm. Exam Location:  [facility] Procedure: 2D Echo, Cardiac Doppler, Color Doppler and Intracardiac            Opacification Agent Indications:    NSTEMI  History:        Patient has prior history of Echocardiogram examinations, most                 recent 05/14/2021. Pulmonary HTN and COPD; Risk Factors:Current                 Smoker and Hypertension. GERD.  Sonographer:    Clayton Lefort RDCS (AE) Referring Phys: [place]  1. Left ventricular ejection fraction, by estimation, is 60 to 65%. The left ventricle has normal function. The left ventricle has no regional wall motion abnormalities. There is mild left ventricular hypertrophy. Left ventricular diastolic parameters are consistent with Grade I diastolic dysfunction (impaired relaxation). Elevated left atrial pressure.  2. Ventricular septum is flattened in systole suggesting RV pressure overload. . Right ventricular systolic function moderately to severely reduced. The right ventricular size is moderately to severely dilated. There is moderately elevated pulmonary artery systolic pressure.  3. Left atrial size was moderately dilated.  4. Right atrial size was moderately dilated.  5. The mitral valve is normal in structure. Trivial mitral valve regurgitation.  No evidence of mitral stenosis.  6. The tricuspid valve is abnormal. Tricuspid valve regurgitation is moderate.  7. The aortic valve was not well visualized. There is mild calcification of the aortic valve. There is mild thickening of the aortic valve. Aortic valve regurgitation is not visualized.  No aortic stenosis is present.  8. The inferior vena cava is dilated in size with <50% respiratory variability, suggesting right atrial pressure of 15 mmHg. FINDINGS  Left Ventricle: Left ventricular ejection fraction, by estimation, is 60 to 65%. The left ventricle has normal function. The left ventricle has no regional wall motion abnormalities. Definity contrast agent was given IV to delineate the left ventricular  endocardial borders. The left ventricular internal cavity size was normal in size. There is mild left ventricular hypertrophy. Left ventricular diastolic parameters are consistent with Grade I diastolic dysfunction (impaired relaxation). Elevated left atrial pressure. Right Ventricle: Ventricular septum is flattened in systole suggesting RV pressure overload. The right ventricular size is moderately to severely dilated. Right vetricular wall thickness was not well visualized. Right ventricular systolic function moderately to severely reduced. There is moderately elevated pulmonary artery systolic pressure. The tricuspid regurgitant velocity is 2.81 m/s, and with an assumed right atrial pressure of 15 mmHg, the estimated right ventricular systolic pressure is 09.6 mmHg. Left Atrium: Left atrial size was moderately dilated. Right Atrium: Right atrial size was moderately dilated. Pericardium: There is no evidence of pericardial effusion. Mitral Valve: The mitral valve is normal in structure. Trivial mitral valve regurgitation. No evidence of mitral valve stenosis. MV peak gradient, 4.8 mmHg. The mean mitral valve gradient is 2.0 mmHg. Tricuspid Valve: The tricuspid valve is abnormal. Tricuspid valve  regurgitation is moderate . No evidence of tricuspid stenosis. Aortic Valve: The aortic valve was not well visualized. There is mild calcification of the aortic valve. There is mild thickening of the aortic valve. There is mild aortic valve annular calcification. Aortic valve regurgitation is not visualized. No aortic stenosis is present. Aortic valve mean gradient measures 4.0 mmHg. Aortic valve peak gradient measures 7.0 mmHg. Aortic valve area, by VTI measures 2.21 cm. Pulmonic Valve: The pulmonic valve was not well visualized. Pulmonic valve regurgitation is not visualized. No evidence of pulmonic stenosis. Aorta: The aortic root is normal in size and structure. Venous: The inferior vena cava is dilated in size with less than 50% respiratory variability, suggesting right atrial pressure of 15 mmHg. IAS/Shunts: No atrial level shunt detected by color flow Doppler.  LEFT VENTRICLE PLAX 2D LVIDd:         3.50 cm   Diastology LVIDs:         2.30 cm   LV e' medial:    3.92 cm/s LV PW:         1.20 cm   LV E/e' medial:  21.9 LV IVS:        1.20 cm   LV e' lateral:   6.64 cm/s LVOT diam:     1.90 cm   LV E/e' lateral: 12.9 LV SV:         57 LV SV Index:   32 LVOT Area:     2.84 cm  RIGHT VENTRICLE             IVC RV Basal diam:  4.60 cm     IVC diam: 2.30 cm RV Mid diam:    3.70 cm RV S prime:     10.30 cm/s TAPSE (M-mode): 2.1 cm LEFT ATRIUM             Index        RIGHT ATRIUM           Index LA diam:        3.20 cm 1.81 cm/m   RA Area:  21.80 cm LA Vol (A2C):   52.2 ml 29.46 ml/m  RA Volume:   71.80 ml  40.53 ml/m LA Vol (A4C):   95.6 ml 53.96 ml/m LA Biplane Vol: 75.8 ml 42.78 ml/m  AORTIC VALVE AV Area (Vmax):    2.28 cm AV Area (Vmean):   2.21 cm AV Area (VTI):     2.21 cm AV Vmax:           132.00 cm/s AV Vmean:          88.400 cm/s AV VTI:            0.257 m AV Peak Grad:      7.0 mmHg AV Mean Grad:      4.0 mmHg LVOT Vmax:         106.00 cm/s LVOT Vmean:        69.000 cm/s LVOT VTI:           0.200 m LVOT/AV VTI ratio: 0.78  AORTA Ao Root diam: 3.10 cm Ao Asc diam:  3.00 cm MITRAL VALVE                TRICUSPID VALVE MV Area (PHT): 2.17 cm     TR Peak grad:   31.6 mmHg MV Area VTI:   1.44 cm     TR Vmax:        281.00 cm/s MV Peak grad:  4.8 mmHg MV Mean grad:  2.0 mmHg     SHUNTS MV Vmax:       1.10 m/s     Systemic VTI:  0.20 m MV Vmean:      60.4 cm/s    Systemic Diam: 1.90 cm MV Decel Time: 349 msec MV E velocity: 85.70 cm/s MV A velocity: 102.00 cm/s MV E/A ratio:  0.84 Carlyle Dolly MD Electronically signed by Carlyle Dolly MD Signature Date/Time: 06/06/2021/5:29:41 PM    Final         Scheduled Meds:  aspirin EC  81 mg Oral Daily   atorvastatin  40 mg Oral Daily   escitalopram  10 mg Oral QHS   furosemide  40 mg Oral Daily   gabapentin  300 mg Oral TID   heparin  5,000 Units Subcutaneous Q8H   ipratropium-albuterol  3 mL Inhalation BID   metoprolol tartrate  12.5 mg Oral BID   pantoprazole  40 mg Oral QHS   potassium chloride  10 mEq Oral Daily   sodium chloride flush  3 mL Intravenous Q12H   Continuous Infusions:  sodium chloride       LOS: 2 days    Time spent: 35 minutes    Irine Seal, MD [facility]   To contact the attending provider between 7A-7P or the covering provider during after hours 7P-7A, please log into the web site [email or web address] and access using universal [place] password for that web site. If you do not have the password, please call the hospital operator.  06/07/2021, 4:12 PM

## 2021-06-09 ENCOUNTER — Encounter (HOSPITAL_COMMUNITY): Payer: Self-pay | Admitting: Cardiology

## 2021-06-09 ENCOUNTER — Telehealth: Payer: Self-pay

## 2021-06-09 LAB — POCT I-STAT EG7
Acid-Base Excess: 7 mmol/L — ABNORMAL HIGH (ref 0.0–2.0)
Acid-Base Excess: 8 mmol/L — ABNORMAL HIGH (ref 0.0–2.0)
Bicarbonate: 35.1 mmol/L — ABNORMAL HIGH (ref 20.0–28.0)
Bicarbonate: 35.6 mmol/L — ABNORMAL HIGH (ref 20.0–28.0)
Calcium, Ion: 1.18 mmol/L (ref 1.15–1.40)
Calcium, Ion: 1.2 mmol/L (ref 1.15–1.40)
HCT: 44 % (ref 36.0–46.0)
HCT: 45 % (ref 36.0–46.0)
Hemoglobin: 15 g/dL (ref 12.0–15.0)
Hemoglobin: 15.3 g/dL — ABNORMAL HIGH (ref 12.0–15.0)
O2 Saturation: 68 %
O2 Saturation: 68 %
Potassium: 4.1 mmol/L (ref 3.5–5.1)
Potassium: 4.2 mmol/L (ref 3.5–5.1)
Sodium: 131 mmol/L — ABNORMAL LOW (ref 135–145)
Sodium: 131 mmol/L — ABNORMAL LOW (ref 135–145)
TCO2: 37 mmol/L — ABNORMAL HIGH (ref 22–32)
TCO2: 37 mmol/L — ABNORMAL HIGH (ref 22–32)
pCO2, Ven: 61.4 mmHg — ABNORMAL HIGH (ref 44–60)
pCO2, Ven: 63.3 mmHg — ABNORMAL HIGH (ref 44–60)
pH, Ven: 7.358 (ref 7.25–7.43)
pH, Ven: 7.365 (ref 7.25–7.43)
pO2, Ven: 38 mmHg (ref 32–45)
pO2, Ven: 38 mmHg (ref 32–45)

## 2021-06-09 LAB — POCT I-STAT 7, (LYTES, BLD GAS, ICA,H+H)
Acid-Base Excess: 7 mmol/L — ABNORMAL HIGH (ref 0.0–2.0)
Bicarbonate: 34.6 mmol/L — ABNORMAL HIGH (ref 20.0–28.0)
Calcium, Ion: 1.2 mmol/L (ref 1.15–1.40)
HCT: 45 % (ref 36.0–46.0)
Hemoglobin: 15.3 g/dL — ABNORMAL HIGH (ref 12.0–15.0)
O2 Saturation: 97 %
Potassium: 4.3 mmol/L (ref 3.5–5.1)
Sodium: 132 mmol/L — ABNORMAL LOW (ref 135–145)
TCO2: 36 mmol/L — ABNORMAL HIGH (ref 22–32)
pCO2 arterial: 57.8 mmHg — ABNORMAL HIGH (ref 32–48)
pH, Arterial: 7.385 (ref 7.35–7.45)
pO2, Arterial: 99 mmHg (ref 83–108)

## 2021-06-09 MED FILL — Verapamil HCl IV Soln 2.5 MG/ML: INTRAVENOUS | Qty: 2 | Status: AC

## 2021-06-09 NOTE — Telephone Encounter (Signed)
-----  Message from Desma Paganini sent at 06/06/2021  3:25 PM EST ----- ?PT IS SCHEDULED FOR TOC W/ DR. Harrington Challenger ON 3/22 ? ?

## 2021-06-09 NOTE — Telephone Encounter (Signed)
Patient contacted regarding discharge from [facility] on 06/07/21. ? ?Patient understands to follow up with provider Ross on 06/25/21 at 1120 at [facility]. ?Patient understands discharge instructions? yes ?Patient understands medications and regiment? yes ?Patient understands to bring all medications to this visit? yes ? ?Ask patient:  Are you enrolled in My Chart (yes or no)  If no ask patient if they would like to enroll. No per pt ? ?For patients ?Discussed when and how to use NTG ? ?

## 2021-06-19 ENCOUNTER — Ambulatory Visit (HOSPITAL_COMMUNITY)
Admission: RE | Admit: 2021-06-19 | Discharge: 2021-06-19 | Disposition: A | Discharge status: Home / Self Care | Payer: Medicare Other | Source: Ambulatory Visit | Attending: Cardiology | Admitting: Cardiology

## 2021-06-19 ENCOUNTER — Other Ambulatory Visit: Payer: Self-pay

## 2021-06-19 ENCOUNTER — Ambulatory Visit: Payer: Medicare Other

## 2021-06-19 ENCOUNTER — Telehealth: Payer: Self-pay | Admitting: Internal Medicine

## 2021-06-19 VITALS — BP 140/66 | HR 87 | Ht 62.0 in

## 2021-06-19 DIAGNOSIS — R1031 Right lower quadrant pain: Secondary | ICD-10-CM | POA: Insufficient documentation

## 2021-06-19 NOTE — Telephone Encounter (Signed)
Patient had a cardiac cath done. Son states that it appears she has develop a hernia at the right groin incision site.  ?

## 2021-06-19 NOTE — Progress Notes (Signed)
Patient presents for nursing visit for ongoing right groin pain after cath. Area examined with nurse Vernie Murders present as chaperone. Mild to moderately tender right groin, no palpable hematoma. Small palpable nodular area likely scar tissue. Given ongoing symptoms 2 weeks from cath will order [place] ? ? ?Zandra Abts MD ?

## 2021-06-19 NOTE — Telephone Encounter (Signed)
Spoke to pt's son who verbalized agreement to bring pt to nursing visit this afternoon at 2:20pm to have DOD look at incision site.  ?

## 2021-06-19 NOTE — Progress Notes (Signed)
Pt presents today for bothersome incision site in the right groin area.  ?PT states that groin area is swollen. Pt states that site is not hot to the touch or red.  ?Pt stated she feels like it is a hernia. ? ?Pt to be seen by Dr. Harl Bowie- DOD, as pt's provider is out of office today.  ?

## 2021-06-24 NOTE — Progress Notes (Signed)
? ?Cardiology Office Note ? ? ?Date:  06/25/2021  ? ?ID:  [name], DOB 02/18/1946, MRN [id number] ? ?PCP:  Lemmie Evens, MD  ?Cardiologist:   Dorris Carnes, MD  ? ?Patient follows up post hospital DC   ? ?  ?History of Present Illness: ?[name] is a 76 y.o. female with a history of HTN, chest tightness, LE edema in past   I saw her in clinic in 2020    ? ?The pt  was recently admitted in early March for NSTEMI  She says she developed severe chest pressure   Never had before    Went to ED and admitted   Peak trop 1784  Underwent R and L heart cath    Mild CAD noted   PCWP was mildly increased   Sent home on ASA, statin and lasix 3x per week    Echo showed  LVEF normal    ?Since d/c she denies CP   Breathing is stable   Uses O2 at home     ?Smoking   Down to 3 cig per day    ?Feels like lasix is making her dry    ? ? ?Current Meds  ?Medication Sig  ? albuterol (PROVENTIL) (2.5 MG/3ML) 0.083% nebulizer solution Take 2.5 mg by nebulization in the morning and at bedtime.  ? amLODipine (NORVASC) 10 MG tablet Take 1 tablet (10 mg total) by mouth daily.  ? aspirin EC 81 MG EC tablet Take 1 tablet (81 mg total) by mouth daily. Swallow whole.  ? atorvastatin (LIPITOR) 40 MG tablet Take 1 tablet (40 mg total) by mouth daily.  ? clonazePAM (KLONOPIN) 1 MG tablet Take 0.5-1 mg by mouth 4 (four) times daily as needed for anxiety.  ? COMBIVENT RESPIMAT 20-100 MCG/ACT AERS respimat Inhale 1 puff into the lungs in the morning and at bedtime.  ? doxylamine, Sleep, (UNISOM) 25 MG tablet Take 25 mg by mouth at bedtime as needed.  ? escitalopram (LEXAPRO) 10 MG tablet Take 10 mg by mouth at bedtime.  ? furosemide (LASIX) 40 MG tablet Take 1 tablet (40 mg total) by mouth once a week. May take for weight gain of 3 lbs over night or 5 lbs in a week  ? gabapentin (NEURONTIN) 300 MG capsule Take 300 mg by mouth 3 (three) times daily.  ? HYDROcodone-acetaminophen (NORCO) 10-325 MG tablet Take 1 tablet by mouth  every 4 (four) hours as needed (severe back pain.).  ? metoprolol tartrate (LOPRESSOR) 25 MG tablet Take 0.5 tablets (12.5 mg total) by mouth 2 (two) times daily.  ? nitroGLYCERIN (NITROSTAT) 0.4 MG SL tablet Place 1 tablet (0.4 mg total) under the tongue every 5 (five) minutes as needed for chest pain.  ? pantoprazole (PROTONIX) 40 MG tablet Take 40 mg by mouth at bedtime.  ? potassium chloride (KLOR-CON) 10 MEQ tablet Take 1 tab when you take the furosemide/Lasix tablet  ? [DISCONTINUED] furosemide (LASIX) 40 MG tablet Take 1 tablet (40 mg total) by mouth daily. 1 tab on Monday Wednesday and Friday plus other days as needed for weight gain or edema  ? ? ? ?Allergies:   Chantix [varenicline], Codeine, and Oxycodone  ? ?Past Medical History:  ?Diagnosis Date  ? Arthritis   ? CHF (congestive heart failure) ([place])   ? Chronic back pain   ? COPD (chronic obstructive pulmonary disease) ([place])   ? patient is on oxygen at night when needed  ? Depression   ? GERD (gastroesophageal  reflux disease)   ? Heart failure ([place])   ? RV  ? Hypertension   ? Neuropathy   ? Pulmonary HTN ([place])   ? Thyroid disease   ? Ulcer   ? Billroth I  ? ? ?Past Surgical History:  ?Procedure Laterality Date  ? ABDOMINAL HYSTERECTOMY    ? BACK SURGERY    ? X4  ? BILROTH I PROCEDURE    ? BIOPSY  12/18/2019  ? Procedure: BIOPSY;  Surgeon: Daneil Dolin, MD;  [facility];  Service: Endoscopy;;  ? BIOPSY  12/27/2020  ? Procedure: BIOPSY;  Surgeon: Daneil Dolin, MD;  [facility];  Service: Endoscopy;;  ? CHOLECYSTECTOMY    ? COLONOSCOPY  05/18/2003  ? HQI:[id number] colonoscopy/ Internal hemorrhoids.  Otherwise, normal rectum  ? COLONOSCOPY  08/12/2010  ? Dr. Arnoldo Morale: cecum visualized and normal, colon and rectum normal. Torturous colon  ? COLONOSCOPY N/A 09/07/2012  ? RMR: tubular adenoma, lipoma. Due for surveillance in 2021  ? COLONOSCOPY WITH PROPOFOL N/A 12/18/2019  ? Rourk: Three 2-21m polyps removed from the descending  colon, tubular adenomas.  Diverticulosis.  Noncompliant left colon.  ? ESOPHAGOGASTRODUODENOSCOPY  05/18/2003  ? RMR:. Normal esophagus/Adenomatous-appearing mucosa at the anastomosis  ? ESOPHAGOGASTRODUODENOSCOPY  08/12/2010  ? Dr. JArnoldo Morale anastomosis widely patent, no ulcerations, CLO test negative  ? ESOPHAGOGASTRODUODENOSCOPY (EGD) WITH PROPOFOL N/A 12/18/2019  ? Rourk: Normal esophagus status post dilation.  Prior hemigastrectomy.  Small bowel nodule of uncertain significance.  Biopsy with mild reactive changes and focal intestinal metaplasia.  Recommend 1 year follow-up EGD.  ? ESOPHAGOGASTRODUODENOSCOPY (EGD) WITH PROPOFOL N/A 12/27/2020  ? Rourk:Normal esophagus s/p dilation, s/p hemigastrectomy, Billroth I configuration, anastomotic nodule biopsied, polypoid duodenal mucosa with surface gastric foveolar metaplasia s/p peptic injury, no malignancy  ? MALONEY DILATION N/A 12/18/2019  ? Procedure: MALONEY DILATION;  Surgeon: RDaneil Dolin MD;  [facility];  Service: Endoscopy;  Laterality: N/A;  ? MALONEY DILATION N/A 12/27/2020  ? Procedure: MALONEY DILATION;  Surgeon: RDaneil Dolin MD;  [facility];  Service: Endoscopy;  Laterality: N/A;  ? POLYPECTOMY  12/18/2019  ? Procedure: POLYPECTOMY;  Surgeon: RDaneil Dolin MD;  [facility];  Service: Endoscopy;;  ? RIGHT HEART CATH N/A 05/04/2018  ? Procedure: RIGHT HEART CATH;  Surgeon: MLarey Dresser MD;  [facility];  Service: Cardiovascular;  Laterality: N/A;  ? RIGHT/LEFT HEART CATH AND CORONARY ANGIOGRAPHY N/A 06/06/2021  ? Procedure: RIGHT/LEFT HEART CATH AND CORONARY ANGIOGRAPHY;  Surgeon: JMartinique Peter M, MD;  [facility];  Service: Cardiovascular;  Laterality: N/A;  ? STOMACH SURGERY    ? removed partial stomach  ? YAG LASER APPLICATION Left 132/44/0102 ? Procedure: YAG LASER APPLICATION;  Surgeon: MRutherford Guys MD;  Location: [facility];  Service: Ophthalmology;  Laterality: Left;   ? YAG LASER APPLICATION Right 172/53/6644 ? Procedure: YAG LASER APPLICATION;  Surgeon: MRutherford Guys MD;  Location: [facility];  Service: Ophthalmology;  Laterality: Right;  ? ? ? ?Social History:  The patient  reports that she has been smoking cigarettes. She has a 4.50 pack-year smoking history. She has never used smokeless tobacco. She reports that she does not drink alcohol and does not use drugs.  ? ?Family History:  The patient's family history includes Cancer in her maternal aunt, paternal aunt, and sister; Cancer (age of onset: 553 in her brother.  ? ? ?ROS:  Please see the  history of present illness. All other systems are reviewed and  Negative to the above problem except as noted.  ? ? ?PHYSICAL EXAM: ?VS:  BP 140/62   Pulse 94   Ht 5' 5" (1.651 m)   Wt 147 lb 12.8 oz (67 kg)   SpO2 (!) 86% Comment: Pt not wearing O2  BMI 24.60 kg/m?   ?GEN: Well nourished, well developed, in no acute distress  ?HEENT: normal  ?Neck: no JVD, carotid bruit ?Cardiac: RRR; no murmurs  No LE  edema  ?Respiratory:  clear to auscultation bilaterally ?GI: soft, nontender, nondistended, + BS  No hepatomegaly  ?MS: no deformity Moving all extremities   ?Skin: warm and dry, no rash ?Neuro:  Strength and sensation are intact ?Psych: euthymic mood, full affect ? ? ?EKG:  EKG is not ordered today. ? ?CARDIAC CATH: R/L3 06/2021 ?  Prox RCA to Mid RCA lesion is 30% stenosed. ?  Dist LAD lesion is 25% stenosed. ?  LV end diastolic pressure is mildly elevated. ?  Hemodynamic findings consistent with moderate pulmonary hypertension. ?  ?Mild nonobstructive CAD ?Moderate pulmonary HTN. PAP mean 35 mm Hg. PVR 7 WU ?Mild elevation of LV filling pressures. PCWP mean 15 mm Hg. LVEDP 16 mm Hg ?Normal cardiac output. Index 2.83.  ?  ?Plan: medical management. Pulmonary pressures have not changed significantly since prior right  heart cath in 2020.  ?  ?ECHO: 06/06/2021 ? 1. Left ventricular ejection fraction, by estimation, is 60 to 65%. The   ?left ventricle has normal function. The left ventricle has no regional  ?wall motion abnormalities. There is mild left ventricular hypertrophy.  ?Left ventricular diastolic parameters are consistent with Grade I

## 2021-06-25 ENCOUNTER — Ambulatory Visit: Payer: Medicare Other | Admitting: Internal Medicine

## 2021-06-25 ENCOUNTER — Other Ambulatory Visit: Payer: Self-pay

## 2021-06-25 ENCOUNTER — Other Ambulatory Visit (HOSPITAL_COMMUNITY)
Admission: RE | Admit: 2021-06-25 | Discharge: 2021-06-25 | Disposition: A | Discharge status: Home / Self Care | Payer: Medicare Other | Source: Ambulatory Visit | Attending: Internal Medicine | Admitting: Internal Medicine

## 2021-06-25 ENCOUNTER — Encounter: Payer: Self-pay | Admitting: Internal Medicine

## 2021-06-25 VITALS — BP 140/62 | HR 94 | Ht 65.0 in | Wt 147.8 lb

## 2021-06-25 DIAGNOSIS — I10 Essential (primary) hypertension: Secondary | ICD-10-CM | POA: Diagnosis not present

## 2021-06-25 LAB — BASIC METABOLIC PANEL
Anion gap: 7 (ref 5–15)
BUN: 14 mg/dL (ref 8–23)
CO2: 32 mmol/L (ref 22–32)
Calcium: 8.8 mg/dL — ABNORMAL LOW (ref 8.9–10.3)
Chloride: 100 mmol/L (ref 98–111)
Creatinine, Ser: 0.63 mg/dL (ref 0.44–1.00)
GFR, Estimated: >60 mL/min (ref >60)
Glucose, Bld: 101 mg/dL — ABNORMAL HIGH (ref 70–99)
Potassium: 3.9 mmol/L (ref 3.5–5.1)
Sodium: 139 mmol/L (ref 135–145)

## 2021-06-25 MED ORDER — FUROSEMIDE 40 MG PO TABS: 40.0000 mg | ORAL_TABLET | ORAL | 11 refills | Status: DC

## 2021-06-25 NOTE — Patient Instructions (Signed)
Medication Instructions:  ?Your physician recommends that you continue on your current medications as directed. Please refer to the Current Medication list given to you today. ? ?Decrease Lasix to 40 mg Weekly . May take for swelling.  ? ?*If you need a refill on your cardiac medications before your next appointment, please call your pharmacy* ? ? ?Lab Work: ?Your physician recommends that you return for lab work in: Today  ? ? ?If you have labs (blood work) drawn today and your tests are completely normal, you will receive your results only by: ?MyChart Message (if you have MyChart) OR ?A paper copy in the mail ?If you have any lab test that is abnormal or we need to change your treatment, we will call you to review the results. ? ? ?Testing/Procedures: ?NONE  ? ? ?Follow-Up: ?At [facility], you and your health needs are our priority.  As part of our continuing mission to provide you with exceptional heart care, we have created designated Provider Care Teams.  These Care Teams include your primary Cardiologist (physician) and Advanced Practice Providers (APPs -  Physician Assistants and Nurse Practitioners) who all work together to provide you with the care you need, when you need it. ? ?We recommend signing up for the patient portal called "MyChart".  Sign up information is provided on this After Visit Summary.  MyChart is used to connect with patients for Virtual Visits (Telemedicine).  Patients are able to view lab/test results, encounter notes, upcoming appointments, etc.  Non-urgent messages can be sent to your provider as well.   ?To learn more about what you can do with MyChart, go to [email or web address].   ? ?Your next appointment:   ? July  ? ?The format for your next appointment:   ?In Person ? ?Provider:   ?Dorris Carnes, MD  ? ? ?Other Instructions ?Thank you for choosing [facility]! ? ? ? ?

## 2021-07-09 ENCOUNTER — Other Ambulatory Visit: Payer: Self-pay | Admitting: Family Medicine

## 2021-07-09 DIAGNOSIS — M545 Low back pain, unspecified: Secondary | ICD-10-CM

## 2021-07-16 ENCOUNTER — Ambulatory Visit
Admission: RE | Admit: 2021-07-16 | Discharge: 2021-07-16 | Disposition: A | Discharge status: Home / Self Care | Payer: Medicare Other | Source: Ambulatory Visit | Attending: Family Medicine | Admitting: Family Medicine

## 2021-07-16 ENCOUNTER — Other Ambulatory Visit: Payer: Self-pay | Admitting: Family Medicine

## 2021-07-16 DIAGNOSIS — M545 Low back pain, unspecified: Secondary | ICD-10-CM

## 2021-07-16 MED ORDER — IOPAMIDOL (ISOVUE-M 200) INJECTION 41%
1.0000 mL | Freq: Once | INTRAMUSCULAR | Status: AC
  Administered 2021-07-16: 1 mL via EPIDURAL

## 2021-07-16 MED ORDER — METHYLPREDNISOLONE ACETATE 40 MG/ML INJ SUSP (RADIOLOG
80.0000 mg | Freq: Once | INTRAMUSCULAR | Status: AC
  Administered 2021-07-16: 80 mg via EPIDURAL

## 2021-07-16 NOTE — Discharge Instructions (Signed)

## 2021-09-08 ENCOUNTER — Other Ambulatory Visit (HOSPITAL_COMMUNITY): Payer: Self-pay | Admitting: *Deleted

## 2021-09-08 ENCOUNTER — Telehealth (HOSPITAL_COMMUNITY): Payer: Self-pay | Admitting: *Deleted

## 2021-09-08 DIAGNOSIS — J441 Chronic obstructive pulmonary disease with (acute) exacerbation: Secondary | ICD-10-CM

## 2021-09-08 NOTE — Telephone Encounter (Signed)
Pts son called to report pt has been more short of breath. Pts O2 87-92% at rest on 4L of O2.   Pts son asked that this message be sent to Dr.McLean

## 2021-09-08 NOTE — Telephone Encounter (Signed)
Has severe copd, probably needs to see pulmonary as first stop.  Make sure she is using her oxygen.

## 2021-09-08 NOTE — Telephone Encounter (Signed)
Pts son said she does not see a pulmonologist. Urgent referral placed.

## 2021-09-19 ENCOUNTER — Encounter (HOSPITAL_COMMUNITY): Payer: Self-pay

## 2021-09-19 ENCOUNTER — Emergency Department (HOSPITAL_COMMUNITY)
Admission: EM | Admit: 2021-09-19 | Discharge: 2021-09-20 | Disposition: A | Discharge status: Home / Self Care | Payer: Medicare Other | Attending: Emergency Medicine | Admitting: Emergency Medicine

## 2021-09-19 ENCOUNTER — Emergency Department (HOSPITAL_COMMUNITY): Payer: Medicare Other

## 2021-09-19 ENCOUNTER — Other Ambulatory Visit: Payer: Self-pay

## 2021-09-19 DIAGNOSIS — J449 Chronic obstructive pulmonary disease, unspecified: Secondary | ICD-10-CM | POA: Insufficient documentation

## 2021-09-19 DIAGNOSIS — I509 Heart failure, unspecified: Secondary | ICD-10-CM | POA: Insufficient documentation

## 2021-09-19 DIAGNOSIS — Z7982 Long term (current) use of aspirin: Secondary | ICD-10-CM | POA: Diagnosis not present

## 2021-09-19 DIAGNOSIS — R0602 Shortness of breath: Secondary | ICD-10-CM | POA: Diagnosis present

## 2021-09-19 DIAGNOSIS — Z7951 Long term (current) use of inhaled steroids: Secondary | ICD-10-CM | POA: Insufficient documentation

## 2021-09-19 DIAGNOSIS — J441 Chronic obstructive pulmonary disease with (acute) exacerbation: Secondary | ICD-10-CM

## 2021-09-19 DIAGNOSIS — I2510 Atherosclerotic heart disease of native coronary artery without angina pectoris: Secondary | ICD-10-CM | POA: Diagnosis not present

## 2021-09-19 LAB — CBC WITH DIFFERENTIAL/PLATELET
Abs Immature Granulocytes: 0.03 10*3/uL (ref 0.00–0.07)
Basophils Absolute: 0 10*3/uL (ref 0.0–0.1)
Basophils Relative: 0 %
Eosinophils Absolute: 0 10*3/uL (ref 0.0–0.5)
Eosinophils Relative: 0 %
HCT: 41.4 % (ref 36.0–46.0)
Hemoglobin: 11.7 g/dL — ABNORMAL LOW (ref 12.0–15.0)
Immature Granulocytes: 0 %
Lymphocytes Relative: 14 %
Lymphs Abs: 1.1 10*3/uL (ref 0.7–4.0)
MCH: 26 pg (ref 26.0–34.0)
MCHC: 28.3 g/dL — ABNORMAL LOW (ref 30.0–36.0)
MCV: 92 fL (ref 80.0–100.0)
Monocytes Absolute: 0.5 10*3/uL (ref 0.1–1.0)
Monocytes Relative: 6 %
Neutro Abs: 6.2 10*3/uL (ref 1.7–7.7)
Neutrophils Relative %: 80 %
Platelets: 274 10*3/uL (ref 150–400)
RBC: 4.5 MIL/uL (ref 3.87–5.11)
RDW: 17.3 % — ABNORMAL HIGH (ref 11.5–15.5)
WBC: 7.9 10*3/uL (ref 4.0–10.5)
nRBC: 0 % (ref 0.0–0.2)

## 2021-09-19 LAB — BRAIN NATRIURETIC PEPTIDE: B Natriuretic Peptide: 319 pg/mL — ABNORMAL HIGH (ref 0.0–100.0)

## 2021-09-19 LAB — BASIC METABOLIC PANEL
Anion gap: 12 (ref 5–15)
BUN: 11 mg/dL (ref 8–23)
CO2: 34 mmol/L — ABNORMAL HIGH (ref 22–32)
Calcium: 8.9 mg/dL (ref 8.9–10.3)
Chloride: 94 mmol/L — ABNORMAL LOW (ref 98–111)
Creatinine, Ser: 0.49 mg/dL (ref 0.44–1.00)
GFR, Estimated: >60 mL/min (ref >60)
Glucose, Bld: 79 mg/dL (ref 70–99)
Potassium: 4.2 mmol/L (ref 3.5–5.1)
Sodium: 140 mmol/L (ref 135–145)

## 2021-09-19 LAB — TROPONIN I (HIGH SENSITIVITY): Troponin I (High Sensitivity): 14 ng/L (ref <18)

## 2021-09-19 MED ORDER — AMLODIPINE BESYLATE 5 MG PO TABS
5.0000 mg | ORAL_TABLET | Freq: Once | ORAL | Status: AC
  Administered 2021-09-19: 5 mg via ORAL
  Filled 2021-09-19: qty 1

## 2021-09-19 MED ORDER — IPRATROPIUM-ALBUTEROL 0.5-2.5 (3) MG/3ML IN SOLN
3.0000 mL | Freq: Once | RESPIRATORY_TRACT | Status: AC
  Administered 2021-09-19: 3 mL via RESPIRATORY_TRACT
  Filled 2021-09-19: qty 3

## 2021-09-19 MED ORDER — FUROSEMIDE 10 MG/ML IJ SOLN
60.0000 mg | Freq: Once | INTRAMUSCULAR | Status: DC
  Filled 2021-09-19: qty 6

## 2021-09-19 MED ORDER — METHYLPREDNISOLONE SODIUM SUCC 125 MG IJ SOLR
80.0000 mg | Freq: Once | INTRAMUSCULAR | Status: AC
  Administered 2021-09-19: 80 mg via INTRAVENOUS
  Filled 2021-09-19: qty 2

## 2021-09-19 MED ORDER — FUROSEMIDE 10 MG/ML IJ SOLN
40.0000 mg | Freq: Once | INTRAMUSCULAR | Status: AC
  Administered 2021-09-19: 40 mg via INTRAVENOUS

## 2021-09-19 MED ORDER — CLONAZEPAM 0.5 MG PO TABS
0.5000 mg | ORAL_TABLET | Freq: Once | ORAL | Status: AC
  Administered 2021-09-19: 0.5 mg via ORAL
  Filled 2021-09-19: qty 1

## 2021-09-19 NOTE — Discharge Instructions (Signed)
As discussed, break your 40 mg Lasix tablet in half and take one half daily (20 mg) until you are seen by your doctor on Monday.  Also, monitor your oxygen level closely and increase your oxygen to 3-1/2 to 4 L if your oxygen level stays below 90%.  Return to the emergency department for any new or worsening symptoms.

## 2021-09-19 NOTE — ED Provider Notes (Signed)
[facility] Provider Note   CSN: [id number] Arrival date & time: 09/19/21  1405     History  Chief Complaint  Patient presents with  . Shortness of Breath    [name] is a 76 y.o. female with a history of advanced COPD on home oxygen at 2.5 L, history of GERD, history of CAD with a non-STEMI in March, also history of CHF presenting for evaluation of increased shortness of breath which has been worsening since yesterday.  She was actively wheezing prior to arrival and was given an albuterol neb treatment 2.5 mL prior to arrival.  She states her breathing is much improved after getting this medication.  She has had a cough which has been nonproductive.  She denies fevers or chills, she also denies orthopnea or peripheral edema.  She does take Lasix 40 mg once weekly and for any weight gain over 3 pounds in 24 hours.  She has not been actively monitoring her weight and has not taken her Lasix tablet this week.  She has not had any of her other medications today including her amlodipine.  She denies chest pain.  She has found no alleviators for symptoms other than rest.  The history is provided by the patient.       Home Medications Prior to Admission medications   Medication Sig Start Date End Date Taking? Authorizing Provider  albuterol (PROVENTIL) (2.5 MG/3ML) 0.083% nebulizer solution Take 2.5 mg by nebulization in the morning and at bedtime. 08/16/20  Yes [provider]  amLODipine (NORVASC) 10 MG tablet Take 1 tablet (10 mg total) by mouth daily. 08/08/20  Yes Larey Dresser, MD  aspirin EC 81 MG EC tablet Take 1 tablet (81 mg total) by mouth daily. Swallow whole. 06/08/21  Yes Barrett, Evelene Croon, PA-C  atorvastatin (LIPITOR) 40 MG tablet Take 1 tablet (40 mg total) by mouth daily. 06/08/21  Yes Barrett, Evelene Croon, PA-C  bismuth subsalicylate (PEPTO BISMOL) 262 MG/15ML suspension Take 30 mLs by mouth every 6 (six) hours as needed for indigestion.    Yes [provider]  clonazePAM (KLONOPIN) 1 MG tablet Take 0.5-1 mg by mouth 4 (four) times daily as needed for anxiety.   Yes [provider]  COMBIVENT RESPIMAT 20-100 MCG/ACT AERS respimat Inhale 1 puff into the lungs in the morning and at bedtime. 08/16/20  Yes [provider]  escitalopram (LEXAPRO) 10 MG tablet Take 10 mg by mouth at bedtime. 07/15/18  Yes [provider]  furosemide (LASIX) 40 MG tablet Take 1 tablet (40 mg total) by mouth once a week. May take for weight gain of 3 lbs over night or 5 lbs in a week 06/25/21  Yes Fay Records, MD  gabapentin (NEURONTIN) 300 MG capsule Take 300 mg by mouth 3 (three) times daily.   Yes [provider]  HYDROcodone-acetaminophen (NORCO) 10-325 MG tablet Take 1 tablet by mouth every 4 (four) hours as needed (severe back pain.). 01/22/17  Yes [provider]  nitroGLYCERIN (NITROSTAT) 0.4 MG SL tablet Place 1 tablet (0.4 mg total) under the tongue every 5 (five) minutes as needed for chest pain. 06/07/21  Yes Barrett, Evelene Croon, PA-C  pantoprazole (PROTONIX) 40 MG tablet Take 40 mg by mouth at bedtime. 08/24/12  Yes [provider]  potassium chloride (KLOR-CON) 10 MEQ tablet Take 1 tab when you take the furosemide/Lasix tablet Patient taking differently: Take 10 mEq by mouth daily as needed (Take with the Lasix  tablet). 06/07/21  Yes Barrett, Evelene Croon, PA-C  metoprolol tartrate (LOPRESSOR) 25 MG tablet Take 0.5 tablets (12.5 mg total) by mouth 2 (two) times daily. Patient not taking: Reported on 09/19/2021 06/07/21   Barrett, Evelene Croon, PA-C      Allergies    Chantix [varenicline], Codeine, and Oxycodone    Review of Systems   Review of Systems  Constitutional:  Negative for chills and fever.  HENT:  Negative for congestion and sore throat.   Eyes: Negative.   Respiratory:  Positive for cough, shortness of breath and wheezing. Negative for chest tightness.   Cardiovascular:  Negative for  chest pain, palpitations and leg swelling.  Gastrointestinal:  Negative for abdominal pain, nausea and vomiting.  Genitourinary: Negative.   Musculoskeletal:  Negative for arthralgias, joint swelling and neck pain.  Skin: Negative.  Negative for rash and wound.  Neurological:  Negative for dizziness, weakness, light-headedness, numbness and headaches.  Psychiatric/Behavioral: Negative.    All other systems reviewed and are negative.   Physical Exam Updated Vital Signs BP (!) 207/94   Pulse 66   Temp 98.4 F (36.9 C) (Oral)   Resp 17   Ht _0  (1.676 m)   Wt 67 kg   SpO2 98%   BMI 23.84 kg/m  Physical Exam Vitals and nursing note reviewed.  Constitutional:      Appearance: She is well-developed.  HENT:     Head: Normocephalic and atraumatic.  Eyes:     Conjunctiva/sclera: Conjunctivae normal.  Cardiovascular:     Rate and Rhythm: Normal rate and regular rhythm.     Heart sounds: Normal heart sounds.  Pulmonary:     Effort: Pulmonary effort is normal.     Breath sounds: Examination of the left-lower field reveals rales. Decreased breath sounds and rales present. No wheezing or rhonchi.     Comments: Distant breath sounds throughout lung fields.  No active wheezing.  She does have trace rales left base, no rhonchi are present. Abdominal:     General: Bowel sounds are normal.     Palpations: Abdomen is soft.     Tenderness: There is no abdominal tenderness.  Musculoskeletal:        General: Normal range of motion.     Cervical back: Normal range of motion.     Right lower leg: No edema.     Left lower leg: No edema.  Skin:    General: Skin is warm and dry.  Neurological:     General: No focal deficit present.     Mental Status: She is alert.     ED Results / Procedures / Treatments   Labs (all labs ordered are listed, but only abnormal results are displayed) Labs Reviewed  CBC WITH DIFFERENTIAL/PLATELET - Abnormal; Notable for the following components:       Result Value   Hemoglobin 11.7 (*)    MCHC 28.3 (*)    RDW 17.3 (*)    All other components within normal limits  BASIC METABOLIC PANEL - Abnormal; Notable for the following components:   Chloride 94 (*)    CO2 34 (*)    All other components within normal limits  BRAIN NATRIURETIC PEPTIDE - Abnormal; Notable for the following components:   B Natriuretic Peptide 319.0 (*)    All other components within normal limits  TROPONIN I (HIGH SENSITIVITY)    EKG None  Radiology DG Chest Portable 1 View  Result Date: 09/19/2021 CLINICAL DATA:  Short of breath EXAM:  PORTABLE CHEST 1 VIEW COMPARISON:  06/05/2021 FINDINGS: Stable large cardiac silhouette. Bilateral pleural effusion noted. Effusions are new from prior. Mild interstitial edema pattern. No overt pulmonary edema. No focal consolidation. IMPRESSION: Cardiomegaly with new bilateral pleural effusions and interstitial edema pattern suggest congestive heart failure. Electronically Signed   By: Suzy Bouchard M.D.   On: 09/19/2021 16:23    Procedures Procedures    Medications Ordered in ED Medications  ipratropium-albuterol (DUONEB) 0.5-2.5 (3) MG/3ML nebulizer solution 3 mL (has no administration in time range)  methylPREDNISolone sodium succinate (SOLU-MEDROL) 125 mg/2 mL injection 80 mg (has no administration in time range)  furosemide (LASIX) injection 40 mg (has no administration in time range)  clonazePAM (KLONOPIN) tablet 0.5 mg (0.5 mg Oral Given 09/19/21 1744)  amLODipine (NORVASC) tablet 5 mg (5 mg Oral Given 09/19/21 2059)    ED Course/ Medical Decision Making/ A&P Clinical Course as of 09/19/21 2105  Fri Sep 19, 2021  1709 Pt now reports a return of "feeling bad" but unable to describe.  She denies chest pain, shortness of breath, wheezing, nausea, dizziness.  No abdominal pain.  She demonstrates taking a deep breath in stuttering inhalation fashion and states "that".  She does endorse having anxiety issues and wonders if  this is an anxiety episode.  She will be given a dose of clonazepam, she is prescribed 0.5 mg 4 times daily as needed. [JI]  2101 At reexam, patient has developed a wheeze in her left anterior upper lung field.  A DuoNeb has been ordered.  Chest x-ray was also reviewed, she does have an elevated BNP level today although it is significantly improved from her admission in March.  She is being given Lasix per IV for diuresis.   [JI]    Clinical Course User Index [JI] Landis Martins                           Medical Decision Making Patient with shortness of breath, probably multi factorial including flare of her COPD and her CHF.  Patient to be diuresed, albuterol/Atrovent has been ordered.  Patient is on 2.5 L oxygen at baseline.  Would recommend ambulating patient once treatment is complete on the 2.5 L to assess for hypoxia..  Patient is motivated to go home rather than being admitted.  Molly Triplett, PA-C assumes care of patient.  Amount and/or Complexity of Data Reviewed Labs: ordered. Radiology: ordered.  Risk Prescription drug management.            Final Clinical Impression(s) / ED Diagnoses Final diagnoses:  None    Rx / DC Orders ED Discharge Orders     None         Landis Martins 09/19/21 2105    Milton Ferguson, MD 09/22/21 1155

## 2021-09-19 NOTE — ED Notes (Signed)
Ambulated patient down the hallway, patient's oxygen saturation 86%.  Patient states she doesn't feel short of breath, but good pleth showing on monitor.  Patient came back up and maintained at 89%.

## 2021-09-19 NOTE — ED Provider Notes (Signed)
Patient signed out to me by Evalee Jefferson, PA-C pending completion of work-up and reassessment.  Patient seen here for shortness of breath, has history of COPD and is on supplemental oxygen chronically at 2-1/2 to 3 L O2 continuously.  See previous provider note for complete H&P  Patient was found to have some wheezing prior to ER arrival and was given albuterol in route. Patient's chest x-ray showed bilateral pleural effusions and BNP was mildly elevated at 319.    She was felt to have a flare of her COPD and CHF  She was given 60 mg IV Lasix here and has been diuresing well.  Reports improvement of her breathing.  She was given albuterol neb as well.  She has ambulated in the department on 3 L oxygen by nasal cannula.  Oxygen dropped to 86% with ambulation, quickly rose to 89% at rest.  She was offered admission to the hospital but declined stating that she prefers to go home.  She has a pulse oximeter at home and can increase her oxygen to 4 L as needed.  I will also recommend that she take her furosemide 20 mg (1/2 of her 40 mg tablet) daily until she is seen by her pulmonologist on Monday. I spoke with her son, Christia Reading who is an Therapist, sports and discussed the plan as well.  Strict return precautions were given.   Kem Parkinson, PA-C 09/19/21 2326    Milton Ferguson, MD 09/22/21 1155

## 2021-09-19 NOTE — ED Triage Notes (Signed)
Patient brought in by [facility] for shortness of breath.  Patient has hx of COPD and is on 3 lpm.  Fire gave 2.5 mL of albuterol for wheezing in upper lobes.

## 2021-09-19 NOTE — ED Notes (Addendum)
Pt's belongs are in the pt belongs bag on the counter. Her shoes , dress, underwear, over coat, and bra.

## 2021-09-20 NOTE — Progress Notes (Unsigned)
Synopsis: Referred for AECOPD by Larey Dresser, MD  Subjective:   PATIENT ID: [name] GENDER: female DOB: March 31, 1946, 76 MRN: [id number]  No chief complaint on file.  76yF with history of CHF, COPD last FEV1 74% with ++BD response, chronic hypoxic respiratory failure, multifactorial moderate PH previously on opsumit (stopped due to AE) and tadalafil (?angioedema - no longer taking) and didn't tolerate tyvaso, GERD referred for worsening dyspnea  She was seen in [facility] 6/16 for either AECOPD or decompensated CHF/PH. CXR suggestive of worsened extravascular volume overload relative to prior  Otherwise pertinent review of systems is negative.  Past Medical History:  Diagnosis Date   Arthritis    CHF (congestive heart failure) (HCC)    Chronic back pain    COPD (chronic obstructive pulmonary disease) (HCC)    patient is on oxygen at night when needed   Depression    GERD (gastroesophageal reflux disease)    Heart failure (HCC)    RV   Hypertension    Neuropathy    Pulmonary HTN (HCC)    Thyroid disease    Ulcer    Billroth I     Family History  Problem Relation Age of Onset   Cancer Sister        breast cancer   Cancer Brother 21       esophageal cancer   Cancer Maternal Aunt        pancreas?   Cancer Paternal Aunt        multiple aunts had cancer   Colon cancer Neg Hx      Past Surgical History:  Procedure Laterality Date   ABDOMINAL HYSTERECTOMY     BACK SURGERY     X4   BILROTH I PROCEDURE     BIOPSY  12/18/2019   Procedure: BIOPSY;  Surgeon: Daneil Dolin, MD;  [facility];  Service: Endoscopy;;   BIOPSY  12/27/2020   Procedure: BIOPSY;  Surgeon: Daneil Dolin, MD;  [facility];  Service: Endoscopy;;   CHOLECYSTECTOMY     COLONOSCOPY  05/18/2003   VOJ:[id number] colonoscopy/ Internal hemorrhoids.  Otherwise, normal rectum   COLONOSCOPY  08/12/2010   Dr. Arnoldo Morale: cecum visualized and normal, colon and rectum  normal. Torturous colon   COLONOSCOPY N/A 09/07/2012   RMR: tubular adenoma, lipoma. Due for surveillance in 2021   COLONOSCOPY WITH PROPOFOL N/A 12/18/2019   Rourk: Three 2-14m polyps removed from the descending colon, tubular adenomas.  Diverticulosis.  Noncompliant left colon.   ESOPHAGOGASTRODUODENOSCOPY  05/18/2003   RMR:. Normal esophagus/Adenomatous-appearing mucosa at the anastomosis   ESOPHAGOGASTRODUODENOSCOPY  08/12/2010   Dr. JArnoldo Morale anastomosis widely patent, no ulcerations, CLO test negative   ESOPHAGOGASTRODUODENOSCOPY (EGD) WITH PROPOFOL N/A 12/18/2019   Rourk: Normal esophagus status post dilation.  Prior hemigastrectomy.  Small bowel nodule of uncertain significance.  Biopsy with mild reactive changes and focal intestinal metaplasia.  Recommend 1 year follow-up EGD.   ESOPHAGOGASTRODUODENOSCOPY (EGD) WITH PROPOFOL N/A 12/27/2020   Rourk:Normal esophagus s/p dilation, s/p hemigastrectomy, Billroth I configuration, anastomotic nodule biopsied, polypoid duodenal mucosa with surface gastric foveolar metaplasia s/p peptic injury, no malignancy   MALONEY DILATION N/A 12/18/2019   Procedure: MALONEY DILATION;  Surgeon: RDaneil Dolin MD;  [facility];  Service: Endoscopy;  Laterality: N/A;   MALONEY DILATION N/A 12/27/2020   Procedure: MVenia MinksDILATION;  Surgeon: RDaneil Dolin MD;  [facility];  Service: Endoscopy;  Laterality: N/A;   POLYPECTOMY  12/18/2019   Procedure: POLYPECTOMY;  Surgeon: Daneil Dolin, MD;  [facility];  Service: Endoscopy;;   RIGHT HEART CATH N/A 05/04/2018   Procedure: RIGHT HEART CATH;  Surgeon: Larey Dresser, MD;  Location: [facility];  Service: Cardiovascular;  Laterality: N/A;   RIGHT/LEFT HEART CATH AND CORONARY ANGIOGRAPHY N/A 06/06/2021   Procedure: RIGHT/LEFT HEART CATH AND CORONARY ANGIOGRAPHY;  Surgeon: Martinique, Peter M, MD;  Location: [place] [facility];  Service: Cardiovascular;  Laterality: N/A;    STOMACH SURGERY     removed partial stomach   YAG LASER APPLICATION Left 51/05/5850   Procedure: YAG LASER APPLICATION;  Surgeon: [name], MD;  Location: [facility];  Service: Ophthalmology;  Laterality: Left;   YAG LASER APPLICATION Right 77/82/4235   Procedure: YAG LASER APPLICATION;  Surgeon: [name], MD;  Location: [facility];  Service: Ophthalmology;  Laterality: Right;    Social History   Socioeconomic History   Marital status: Married    Spouse name: Not on file   Number of children: Not on file   Years of education: Not on file   Highest education level: Not on file  Occupational History   Not on file  Tobacco Use   Smoking status: Every Day    Packs/day: 0.30    Years: 15.00    Total pack years: 4.50    Types: Cigarettes   Smokeless tobacco: Never  Vaping Use   Vaping Use: Never used  Substance and Sexual Activity   Alcohol use: No   Drug use: No   Sexual activity: Not Currently  Other Topics Concern   Not on file  Social History Narrative   Not on file   Social Determinants of Health   Financial Resource Strain: Not on file  Food Insecurity: Not on file  Transportation Needs: Not on file  Physical Activity: Not on file  Stress: Not on file  Social Connections: Not on file  Intimate Partner Violence: Not on file     Allergies  Allergen Reactions   Chantix [Varenicline] Nausea And Vomiting   Codeine Other (See Comments)    hallucinations   Oxycodone Palpitations     Outpatient Medications Prior to Visit  Medication Sig Dispense Refill   albuterol (PROVENTIL) (2.5 MG/3ML) 0.083% nebulizer solution Take 2.5 mg by nebulization in the morning and at bedtime.     amLODipine (NORVASC) 10 MG tablet Take 1 tablet (10 mg total) by mouth daily. 30 tablet 11   aspirin EC 81 MG EC tablet Take 1 tablet (81 mg total) by mouth daily. Swallow whole. 30 tablet 11   atorvastatin (LIPITOR) 40 MG tablet Take 1 tablet (40 mg total) by mouth daily. 30 tablet 11    bismuth subsalicylate (PEPTO BISMOL) 262 MG/15ML suspension Take 30 mLs by mouth every 6 (six) hours as needed for indigestion.     clonazePAM (KLONOPIN) 1 MG tablet Take 0.5-1 mg by mouth 4 (four) times daily as needed for anxiety.     COMBIVENT RESPIMAT 20-100 MCG/ACT AERS respimat Inhale 1 puff into the lungs in the morning and at bedtime.     escitalopram (LEXAPRO) 10 MG tablet Take 10 mg by mouth at bedtime.     furosemide (LASIX) 40 MG tablet Take 1 tablet (40 mg total) by mouth once a week. May take for weight gain of 3 lbs over night or 5 lbs in a week 30 tablet 11   gabapentin (NEURONTIN) 300 MG capsule Take 300 mg  by mouth 3 (three) times daily.     HYDROcodone-acetaminophen (NORCO) 10-325 MG tablet Take 1 tablet by mouth every 4 (four) hours as needed (severe back pain.).     metoprolol tartrate (LOPRESSOR) 25 MG tablet Take 0.5 tablets (12.5 mg total) by mouth 2 (two) times daily. (Patient not taking: Reported on 09/19/2021) 40 tablet 6   nitroGLYCERIN (NITROSTAT) 0.4 MG SL tablet Place 1 tablet (0.4 mg total) under the tongue every 5 (five) minutes as needed for chest pain. 25 tablet 3   pantoprazole (PROTONIX) 40 MG tablet Take 40 mg by mouth at bedtime.     potassium chloride (KLOR-CON) 10 MEQ tablet Take 1 tab when you take the furosemide/Lasix tablet (Patient taking differently: Take 10 mEq by mouth daily as needed (Take with the Lasix tablet).) 60 tablet 11   No facility-administered medications prior to visit.       Objective:   Physical Exam:  General appearance: 76 y.o., female, NAD, conversant  Eyes: anicteric sclerae; PERRL, tracking appropriately HENT: NCAT; MMM Neck: Trachea midline; no lymphadenopathy, no JVD Lungs: CTAB, no crackles, no wheeze, with normal respiratory effort CV: RRR, no murmur  Abdomen: Soft, non-tender; non-distended, BS present  Extremities: No peripheral edema, warm Skin: Normal turgor and texture; no rash Psych: Appropriate affect Neuro:  Alert and oriented to person and place, no focal deficit     There were no vitals filed for this visit.   on *** LPM *** RA BMI Readings from Last 3 Encounters:  09/19/21 23.84 kg/m  06/25/21 24.60 kg/m  06/19/21 28.23 kg/m   Wt Readings from Last 3 Encounters:  09/19/21 147 lb 11.3 oz (67 kg)  06/25/21 147 lb 12.8 oz (67 kg)  06/05/21 154 lb 5.2 oz (70 kg)     CBC    Component Value Date/Time   WBC 7.9 09/19/2021 1644   RBC 4.50 09/19/2021 1644   HGB 11.7 (L) 09/19/2021 1644   HCT 41.4 09/19/2021 1644   PLT 274 09/19/2021 1644   MCV 92.0 09/19/2021 1644   MCH 26.0 09/19/2021 1644   MCHC 28.3 (L) 09/19/2021 1644   RDW 17.3 (H) 09/19/2021 1644   LYMPHSABS 1.1 09/19/2021 1644   MONOABS 0.5 09/19/2021 1644   EOSABS 0.0 09/19/2021 1644   BASOSABS 0.0 09/19/2021 1644    ***  Chest Imaging: CTA Chest 06/07/21 with bronchial wall thickening and emphysema, small left pleural effusion  Pulmonary Functions Testing Results:    Latest Ref Rng & Units 05/12/2018   11:20 AM  PFT Results  FVC-Pre L 1.53   FVC-Predicted Pre % 61   FVC-Post L 2.02   FVC-Predicted Post % 80   Pre FEV1/FVC % % 73   Post FEV1/FCV % % 72   FEV1-Pre L 1.12   FEV1-Predicted Pre % 57   FEV1-Post L 1.45   DLCO uncorrected ml/min/mmHg 10.67   DLCO UNC% % 51   DLVA Predicted % 81   TLC L 5.01   TLC % Predicted % 93   RV % Predicted % 136      Echocardiogram:   TTE 06/06/21:  1. Left ventricular ejection fraction, by estimation, is 60 to 65%. The  left ventricle has normal function. The left ventricle has no regional  wall motion abnormalities. There is mild left ventricular hypertrophy.  Left ventricular diastolic parameters  are consistent with Grade I diastolic dysfunction (impaired relaxation).  Elevated left atrial pressure.   2. Ventricular septum is flattened in systole suggesting RV  pressure  overload. . Right ventricular systolic function moderately to severely  reduced. The right  ventricular size is moderately to severely dilated.  There is moderately elevated pulmonary  artery systolic pressure.   3. Left atrial size was moderately dilated.   4. Right atrial size was moderately dilated.   5. The mitral valve is normal in structure. Trivial mitral valve  regurgitation. No evidence of mitral stenosis.   6. The tricuspid valve is abnormal. Tricuspid valve regurgitation is  moderate.   7. The aortic valve was not well visualized. There is mild calcification  of the aortic valve. There is mild thickening of the aortic valve. Aortic  valve regurgitation is not visualized. No aortic stenosis is present.   8. The inferior vena cava is dilated in size with <50% respiratory  variability, suggesting right atrial pressure of 15 mmHg.   Heart Catheterization: ***    Assessment & Plan:    Plan:      Maryjane Hurter, MD [facility] 09/20/2021 6:29 PM

## 2021-09-22 ENCOUNTER — Encounter: Payer: Self-pay | Admitting: Student

## 2021-09-22 ENCOUNTER — Ambulatory Visit: Payer: Medicare Other | Admitting: Student

## 2021-09-22 VITALS — BP 104/60 | HR 69 | Temp 98.1°F | Ht 65.5 in | Wt 134.0 lb

## 2021-09-22 DIAGNOSIS — J449 Chronic obstructive pulmonary disease, unspecified: Secondary | ICD-10-CM

## 2021-09-22 DIAGNOSIS — I272 Pulmonary hypertension, unspecified: Secondary | ICD-10-CM

## 2021-09-22 DIAGNOSIS — J9611 Chronic respiratory failure with hypoxia: Secondary | ICD-10-CM | POA: Diagnosis not present

## 2021-09-22 MED ORDER — BREZTRI AEROSPHERE 160-9-4.8 MCG/ACT IN AERO: 2.0000 | INHALATION_SPRAY | Freq: Two times a day (BID) | RESPIRATORY_TRACT | 0 refills | Status: DC

## 2021-09-22 NOTE — Patient Instructions (Addendum)
-  you will be called to schedule pulmonary rehab to get moving at [facility] - try breztri 2 puffs daily with a spacer, rinse mouth, brush tongue and rinse spacer. I'll work on finding least expensive option for this in the meantime - dry weight is apparently around 135 lb, would weigh daily if 3 lb gain in a day or 5 lb weight gain in a week would take an extra lasix 40 mg in the afternoon until back down to 135 lb - see you in 3 months or sooner if need be!

## 2021-10-01 ENCOUNTER — Telehealth: Payer: Self-pay | Admitting: Student

## 2021-10-01 DIAGNOSIS — J449 Chronic obstructive pulmonary disease, unspecified: Secondary | ICD-10-CM

## 2021-10-02 ENCOUNTER — Other Ambulatory Visit: Payer: Self-pay

## 2021-10-02 DIAGNOSIS — J449 Chronic obstructive pulmonary disease, unspecified: Secondary | ICD-10-CM

## 2021-10-02 NOTE — Telephone Encounter (Signed)
Called and spoke with [name] [facility]) and informed him that I was sending the spacer to [employer]   Nothing further needed

## 2021-10-13 ENCOUNTER — Encounter: Payer: Self-pay | Admitting: Internal Medicine

## 2021-10-13 ENCOUNTER — Ambulatory Visit: Payer: Medicare Other | Admitting: Internal Medicine

## 2021-10-13 VITALS — BP 115/50 | HR 63 | Ht 66.0 in | Wt 127.0 lb

## 2021-10-13 DIAGNOSIS — I5032 Chronic diastolic (congestive) heart failure: Secondary | ICD-10-CM | POA: Diagnosis not present

## 2021-10-13 MED ORDER — AMLODIPINE BESYLATE 5 MG PO TABS: 5.0000 mg | ORAL_TABLET | Freq: Every day | ORAL | 3 refills | Status: DC

## 2021-10-13 MED ORDER — FUROSEMIDE 40 MG PO TABS: 40.0000 mg | ORAL_TABLET | ORAL | 3 refills | Status: DC

## 2021-10-13 MED ORDER — POTASSIUM CHLORIDE ER 10 MEQ PO TBCR: 10.0000 meq | EXTENDED_RELEASE_TABLET | ORAL | 2 refills | Status: AC

## 2021-10-13 NOTE — Patient Instructions (Addendum)
Medication Instructions:  Your physician has recommended you make the following change in your medication:  Increase Lasix to 40 mg tablets three times weekly Increase K to 10 mEq three times weekly Decrease Amlodipine to 5 mg tablets daily   Labwork: In 2 Weeks: BMET BNP  Testing/Procedures: None  Follow-Up: Follow up with Dr. Harrington Challenger in November 2023.  Any Other Special Instructions Will Be Listed Below (If Applicable).     If you need a refill on your cardiac medications before your next appointment, please call your pharmacy.

## 2021-10-13 NOTE — Progress Notes (Signed)
.   Cardiology Office Note   Date:  10/13/2021   ID:  [name], DOB [birth year], MRN [id number]  PCP:  Lemmie Evens, MD  Cardiologist:   Dorris Carnes, MD   Patient follows up post hospital DC      History of Present Illness: [name] is a 76 y.o. female with a history of HTN, chest tightness, LE edema in past   The patient was admitted in early March 2023 for NSTEM Peak trop 1784  Underwent R and L heart cath    Mild CAD noted  ? Due to spasm   PCWP was mildly increased   Sent home on ASA, statin and lasix 3x per week    Echo showed  LVEF normal     I saw the pt in March 2023  She comes in today with her son.   He says that she has been erratic, not dependably taking meds  She went to ER on 09/19/21 with severe SOB Wheezing    Weight was up   Pt edematous  She was diuresed with IV lasix    Improved   Sent home    Again, son says patient had not been taking meds      Current Meds  Medication Sig   albuterol (PROVENTIL) (2.5 MG/3ML) 0.083% nebulizer solution Take 2.5 mg by nebulization in the morning and at bedtime.   amLODipine (NORVASC) 10 MG tablet Take 1 tablet (10 mg total) by mouth daily.   atorvastatin (LIPITOR) 40 MG tablet Take 1 tablet (40 mg total) by mouth daily.   bismuth subsalicylate (PEPTO BISMOL) 262 MG/15ML suspension Take 30 mLs by mouth every 6 (six) hours as needed for indigestion.   Budeson-Glycopyrrol-Formoterol (BREZTRI AEROSPHERE) 160-9-4.8 MCG/ACT AERO Inhale 2 puffs into the lungs in the morning and at bedtime.   clonazePAM (KLONOPIN) 1 MG tablet Take 0.5-1 mg by mouth 4 (four) times daily as needed for anxiety.   escitalopram (LEXAPRO) 10 MG tablet Take 10 mg by mouth at bedtime.   furosemide (LASIX) 40 MG tablet Take 1 tablet (40 mg total) by mouth once a week. May take for weight gain of 3 lbs over night or 5 lbs in a week   gabapentin (NEURONTIN) 300 MG capsule Take 300 mg by mouth 3 (three) times daily.    HYDROcodone-acetaminophen (NORCO) 10-325 MG tablet Take 1 tablet by mouth every 4 (four) hours as needed (severe back pain.).   nitroGLYCERIN (NITROSTAT) 0.4 MG SL tablet Place 1 tablet (0.4 mg total) under the tongue every 5 (five) minutes as needed for chest pain.   pantoprazole (PROTONIX) 40 MG tablet Take 40 mg by mouth at bedtime.   potassium chloride (KLOR-CON) 10 MEQ tablet Take 1 tab when you take the furosemide/Lasix tablet (Patient taking differently: Take 10 mEq by mouth daily as needed (Take with the Lasix tablet).)     Allergies:   Chantix [varenicline], Codeine, and Oxycodone   Past Medical History:  Diagnosis Date   Arthritis    CHF (congestive heart failure) (HCC)    Chronic back pain    COPD (chronic obstructive pulmonary disease) (HCC)    patient is on oxygen at night when needed   Depression    GERD (gastroesophageal reflux disease)    Heart failure (HCC)    RV   Hypertension    Neuropathy    Pulmonary HTN (HCC)    Thyroid disease    Ulcer    Billroth I  Past Surgical History:  Procedure Laterality Date   ABDOMINAL HYSTERECTOMY     BACK SURGERY     X4   BILROTH I PROCEDURE     BIOPSY  12/18/2019   Procedure: BIOPSY;  Surgeon: Daneil Dolin, MD;  [facility];  Service: Endoscopy;;   BIOPSY  12/27/2020   Procedure: BIOPSY;  Surgeon: Daneil Dolin, MD;  [facility];  Service: Endoscopy;;   CHOLECYSTECTOMY     COLONOSCOPY  05/18/2003   [id number] colonoscopy/ Internal hemorrhoids.  Otherwise, normal rectum   COLONOSCOPY  08/12/2010   Dr. Arnoldo Morale: cecum visualized and normal, colon and rectum normal. Torturous colon   COLONOSCOPY N/A 09/07/2012   RMR: tubular adenoma, lipoma. Due for surveillance in 2021   COLONOSCOPY WITH PROPOFOL N/A 12/18/2019   Rourk: Three 2-28m polyps removed from the descending colon, tubular adenomas.  Diverticulosis.  Noncompliant left colon.   ESOPHAGOGASTRODUODENOSCOPY  05/18/2003   RMR:.  Normal esophagus/Adenomatous-appearing mucosa at the anastomosis   ESOPHAGOGASTRODUODENOSCOPY  08/12/2010   Dr. JArnoldo Morale anastomosis widely patent, no ulcerations, CLO test negative   ESOPHAGOGASTRODUODENOSCOPY (EGD) WITH PROPOFOL N/A 12/18/2019   Rourk: Normal esophagus status post dilation.  Prior hemigastrectomy.  Small bowel nodule of uncertain significance.  Biopsy with mild reactive changes and focal intestinal metaplasia.  Recommend 1 year follow-up EGD.   ESOPHAGOGASTRODUODENOSCOPY (EGD) WITH PROPOFOL N/A 12/27/2020   Rourk:Normal esophagus s/p dilation, s/p hemigastrectomy, Billroth I configuration, anastomotic nodule biopsied, polypoid duodenal mucosa with surface gastric foveolar metaplasia s/p peptic injury, no malignancy   MALONEY DILATION N/A 12/18/2019   Procedure: MALONEY DILATION;  Surgeon: RDaneil Dolin MD;  [facility];  Service: Endoscopy;  Laterality: N/A;   MALONEY DILATION N/A 12/27/2020   Procedure: MVenia MinksDILATION;  Surgeon: RDaneil Dolin MD;  [facility];  Service: Endoscopy;  Laterality: N/A;   POLYPECTOMY  12/18/2019   Procedure: POLYPECTOMY;  Surgeon: RDaneil Dolin MD;  [facility];  Service: Endoscopy;;   RIGHT HEART CATH N/A 05/04/2018   Procedure: RIGHT HEART CATH;  Surgeon: MLarey Dresser MD;  [facility];  Service: Cardiovascular;  Laterality: N/A;   RIGHT/LEFT HEART CATH AND CORONARY ANGIOGRAPHY N/A 06/06/2021   Procedure: RIGHT/LEFT HEART CATH AND CORONARY ANGIOGRAPHY;  Surgeon: JMartinique Peter M, MD;  [place];  Service: Cardiovascular;  Laterality: N/A;   STOMACH SURGERY     removed partial stomach   YAG LASER APPLICATION Left 160/63/0160  Procedure: YAG LASER APPLICATION;  Surgeon: MRutherford Guys MD;  Location: [facility];  Service: Ophthalmology;  Laterality: Left;   YAG LASER APPLICATION Right 110/93/2355  Procedure: YAG LASER APPLICATION;  Surgeon: MRutherford Guys MD;  Location:  [facility];  Service: Ophthalmology;  Laterality: Right;     Social History:  The patient  reports that she has been smoking cigarettes. She has a 56.00 pack-year smoking history. She has never used smokeless tobacco. She reports that she does not drink alcohol and does not use drugs.   Family History:  The patient's family history includes Cancer in her maternal aunt, paternal aunt, and sister; Cancer (age of onset: 565 in her brother.    ROS:  Please see the history of present illness. All other systems are reviewed and  Negative to the above problem except as noted.    PHYSICAL EXAM: VS:  BP (!) 115/50 (BP Location: Left Arm, Patient Position: Sitting, Cuff Size: Normal)   Pulse  63   Ht 5' 6" (1.676 m)   Wt 127 lb (57.6 kg)   SpO2 (!) 80%   BMI 20.50 kg/m   GEN: Well nourished, well developed, in no acute distress  HEENT: normal  Neck: no JVD, carotid bruit Cardiac: RRR; no murmurs  No LE  edema  Respiratory:  Decreased airflow  GI: soft, nontender, nondistended, + BS  No hepatomegaly  MS: no deformity Moving all extremities   Skin: warm and dry, no rash Neuro:  Strength and sensation are intact Psych: euthymic mood, full affect   EKG:  EKG is not ordered today.  CARDIAC CATH: R/L3 06/2021   Prox RCA to Mid RCA lesion is 30% stenosed.   Dist LAD lesion is 25% stenosed.   LV end diastolic pressure is mildly elevated.   Hemodynamic findings consistent with moderate pulmonary hypertension.   Mild nonobstructive CAD Moderate pulmonary HTN. PAP mean 35 mm Hg. PVR 7 WU Mild elevation of LV filling pressures. PCWP mean 15 mm Hg. LVEDP 16 mm Hg Normal cardiac output. Index 2.83.    Plan: medical management. Pulmonary pressures have not changed significantly since prior right  heart cath in 2020.    ECHO: 06/06/2021  1. Left ventricular ejection fraction, by estimation, is 60 to 65%. The  left ventricle has normal function. The left ventricle has no regional  wall motion  abnormalities. There is mild left ventricular hypertrophy.  Left ventricular diastolic parameters are consistent with Grade I diastolic dysfunction (impaired relaxation). Elevated left atrial pressure.   2. Ventricular septum is flattened in systole suggesting RV pressure  overload. . Right ventricular systolic function moderately to severely  reduced. The right ventricular size is moderately to severely dilated.  There is moderately elevated pulmonary artery systolic pressure, 80.2.  3. Left atrial size was moderately dilated.   4. Right atrial size was moderately dilated.   5. The mitral valve is normal in structure. Trivial mitral valve  regurgitation. No evidence of mitral stenosis.   6. The tricuspid valve is abnormal. Tricuspid valve regurgitation is  moderate.   7. The aortic valve was not well visualized. There is mild calcification  of the aortic valve. There is mild thickening of the aortic valve. Aortic  valve regurgitation is not visualized. No aortic stenosis is present.   8. The inferior vena cava is dilated in size with <50% respiratory  variability, suggesting right atrial pressure of 15 mmHg.  Lipid Panel    Component Value Date/Time   CHOL 138 06/07/2021 0324   TRIG 91 06/07/2021 0324   HDL 49 06/07/2021 0324   CHOLHDL 2.8 06/07/2021 0324   VLDL 18 06/07/2021 0324   LDLCALC 71 06/07/2021 0324      Wt Readings from Last 3 Encounters:  10/13/21 127 lb (57.6 kg)  09/22/21 134 lb (60.8 kg)  09/19/21 147 lb 11.3 oz (67 kg)      ASSESSMENT AND PLAN:  1  HFpEF   PT with recent exacerbation   Treated in ED   Probably because she wasn't taking meds and not eating well      Dong better now     I would recomm changing lasix ot 40 mg 3x per week with 10 KCL (40 mg with 10 mEq)   Check BMET  and BNP in 2 wks     Will arrange for Visiting nurse to check pt at home   Son reports she is not eating correctly   LIkes salty snacks  Says his mother does not weigh herself even  though she has a scale   Says sihe is not reliable in taking meds  Patient had [facility] come by before   2  Hx CAD   Mild to mod at cath this year   NSTEMI due to ? Spasm    Keep on current regimen  Pt asymptomatic    3  HTN  Son reports her BP has been 100s to 110s at home   Worried going up on lasix she will get dizzy  Will cut back to 5 mg for now     4 HL Keep on Crstor   LDL 71      5  COPD  Pt on O2 .  Has portable tank she is using today        6  Tob  Continue to counsel   F/U in clnic in the fall    Current medicines are reviewed at length with the patient today.  The patient does not have concerns regarding medicines.  Signed, Dorris Carnes, MD  10/13/2021 2:18 PM    [facility] [place], [place], [place]  [address] Phone: [phone]; Fax: [phone]

## 2021-10-14 ENCOUNTER — Other Ambulatory Visit: Payer: Self-pay

## 2021-10-14 DIAGNOSIS — I5032 Chronic diastolic (congestive) heart failure: Secondary | ICD-10-CM

## 2021-10-14 DIAGNOSIS — J441 Chronic obstructive pulmonary disease with (acute) exacerbation: Secondary | ICD-10-CM

## 2021-10-28 ENCOUNTER — Other Ambulatory Visit: Payer: Self-pay | Admitting: Family Medicine

## 2021-10-28 DIAGNOSIS — M545 Low back pain, unspecified: Secondary | ICD-10-CM

## 2021-10-29 ENCOUNTER — Other Ambulatory Visit (HOSPITAL_COMMUNITY)
Admission: RE | Admit: 2021-10-29 | Discharge: 2021-10-29 | Disposition: A | Discharge status: Home / Self Care | Payer: Medicare Other | Source: Ambulatory Visit | Attending: Internal Medicine | Admitting: Internal Medicine

## 2021-10-29 DIAGNOSIS — I5032 Chronic diastolic (congestive) heart failure: Secondary | ICD-10-CM | POA: Insufficient documentation

## 2021-10-29 LAB — BASIC METABOLIC PANEL
Anion gap: 9 (ref 5–15)
BUN: 13 mg/dL (ref 8–23)
CO2: 31 mmol/L (ref 22–32)
Calcium: 8.9 mg/dL (ref 8.9–10.3)
Chloride: 97 mmol/L — ABNORMAL LOW (ref 98–111)
Creatinine, Ser: 0.61 mg/dL (ref 0.44–1.00)
GFR, Estimated: >60 mL/min (ref >60)
Glucose, Bld: 106 mg/dL — ABNORMAL HIGH (ref 70–99)
Potassium: 3.6 mmol/L (ref 3.5–5.1)
Sodium: 137 mmol/L (ref 135–145)

## 2021-10-29 LAB — BRAIN NATRIURETIC PEPTIDE: B Natriuretic Peptide: 136 pg/mL — ABNORMAL HIGH (ref 0.0–100.0)

## 2021-11-03 ENCOUNTER — Ambulatory Visit
Admission: RE | Admit: 2021-11-03 | Discharge: 2021-11-03 | Disposition: A | Discharge status: Home / Self Care | Payer: Medicare Other | Source: Ambulatory Visit | Attending: Family Medicine | Admitting: Family Medicine

## 2021-11-03 DIAGNOSIS — G8929 Other chronic pain: Secondary | ICD-10-CM

## 2021-11-03 MED ORDER — METHYLPREDNISOLONE ACETATE 40 MG/ML INJ SUSP (RADIOLOG: 80.0000 mg | Freq: Once | INTRAMUSCULAR | Status: DC

## 2021-11-03 MED ORDER — IOPAMIDOL (ISOVUE-M 200) INJECTION 41%: 1.0000 mL | Freq: Once | INTRAMUSCULAR | Status: DC

## 2021-11-03 NOTE — Discharge Instructions (Signed)

## 2021-11-25 ENCOUNTER — Emergency Department (HOSPITAL_COMMUNITY)
Admission: EM | Admit: 2021-11-25 | Discharge: 2021-11-25 | Disposition: A | Discharge status: Home / Self Care | Payer: Medicare Other | Attending: Emergency Medicine | Admitting: Emergency Medicine

## 2021-11-25 ENCOUNTER — Encounter (HOSPITAL_COMMUNITY): Payer: Self-pay

## 2021-11-25 ENCOUNTER — Other Ambulatory Visit: Payer: Self-pay

## 2021-11-25 DIAGNOSIS — T310 Burns involving less than 10% of body surface: Secondary | ICD-10-CM | POA: Insufficient documentation

## 2021-11-25 DIAGNOSIS — J449 Chronic obstructive pulmonary disease, unspecified: Secondary | ICD-10-CM | POA: Diagnosis not present

## 2021-11-25 DIAGNOSIS — X088XXA Exposure to other specified smoke, fire and flames, initial encounter: Secondary | ICD-10-CM | POA: Insufficient documentation

## 2021-11-25 DIAGNOSIS — T2020XA Burn of second degree of head, face, and neck, unspecified site, initial encounter: Secondary | ICD-10-CM | POA: Diagnosis present

## 2021-11-25 DIAGNOSIS — Z23 Encounter for immunization: Secondary | ICD-10-CM | POA: Insufficient documentation

## 2021-11-25 DIAGNOSIS — Z7951 Long term (current) use of inhaled steroids: Secondary | ICD-10-CM | POA: Insufficient documentation

## 2021-11-25 MED ORDER — BACITRACIN ZINC 500 UNIT/GM EX OINT
TOPICAL_OINTMENT | CUTANEOUS | Status: AC
  Filled 2021-11-25: qty 3.6

## 2021-11-25 MED ORDER — BACITRACIN ZINC 500 UNIT/GM EX OINT: 1.0000 | TOPICAL_OINTMENT | Freq: Three times a day (TID) | CUTANEOUS | 0 refills | Status: DC

## 2021-11-25 MED ORDER — TETANUS-DIPHTH-ACELL PERTUSSIS 5-2.5-18.5 LF-MCG/0.5 IM SUSY
0.5000 mL | PREFILLED_SYRINGE | Freq: Once | INTRAMUSCULAR | Status: AC
Start: 2021-11-25 — End: 2021-11-25
  Administered 2021-11-25: 0.5 mL via INTRAMUSCULAR
  Filled 2021-11-25: qty 0.5
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
## 2021-12-19 ENCOUNTER — Ambulatory Visit: Payer: Medicare Other | Admitting: Student

## 2022-02-10 ENCOUNTER — Other Ambulatory Visit: Payer: Self-pay | Admitting: Family Medicine

## 2022-02-10 DIAGNOSIS — G8929 Other chronic pain: Secondary | ICD-10-CM

## 2022-02-24 ENCOUNTER — Ambulatory Visit
Admission: RE | Admit: 2022-02-24 | Discharge: 2022-02-24 | Disposition: A | Discharge status: Home / Self Care | Payer: Medicare Other | Source: Ambulatory Visit | Attending: Family Medicine | Admitting: Family Medicine

## 2022-02-24 ENCOUNTER — Other Ambulatory Visit: Payer: Self-pay | Admitting: Family Medicine

## 2022-02-24 DIAGNOSIS — M545 Low back pain, unspecified: Secondary | ICD-10-CM

## 2022-02-24 MED ORDER — IOPAMIDOL (ISOVUE-M 200) INJECTION 41%
1.0000 mL | Freq: Once | INTRAMUSCULAR | Status: AC
  Administered 2022-02-24: 1 mL via EPIDURAL

## 2022-02-24 MED ORDER — METHYLPREDNISOLONE ACETATE 40 MG/ML INJ SUSP (RADIOLOG
80.0000 mg | Freq: Once | INTRAMUSCULAR | Status: AC
  Administered 2022-02-24: 80 mg via EPIDURAL

## 2022-02-24 NOTE — Discharge Instructions (Signed)

## 2022-03-03 NOTE — Progress Notes (Signed)
.   Cardiology Office Note   Date:  03/06/2022   ID:  [name], DOB [birth year], MRN [id number]  PCP:  Lemmie Evens, MD  Cardiologist:   Dorris Carnes, MD   Patient follows up post hospital DC      History of Present Illness: [name] is a 76 y.o. female with a history of HTN, chest tightness, LE edema in past   The patient was admitted in early March 2023 for NSTEM Peak trop 1784  Underwent R and L heart cath    Mild CAD noted  ? Due to spasm   PCWP was mildly increased   Sent home on ASA, statin and lasix 3x per week    Echo showed  LVEF normal at that time     I saw the pt in March 2023  She comes in today with her son.   He says that she has been erratic, not dependably taking meds  She went to ER on 09/19/21 with severe SOB Wheezing    Weight was up   Pt edematous  She was diuresed with IV lasix    Improved   Sent home    Again, son says patient had not been taking meds     Isaw he pt in July 2023    Pt denies CP   Breathing OK  Sats at home 93%    NO dizziness   No palpitations     Takes acitvity slowely L ankle swollen   Lasix helps  BP runs about 120s, 132, 134  Current Meds  Medication Sig   albuterol (PROVENTIL) (2.5 MG/3ML) 0.083% nebulizer solution Take 2.5 mg by nebulization in the morning and at bedtime.   amLODipine (NORVASC) 5 MG tablet Take 1 tablet (5 mg total) by mouth daily.   aspirin EC 81 MG EC tablet Take 1 tablet (81 mg total) by mouth daily. Swallow whole.   atorvastatin (LIPITOR) 40 MG tablet Take 1 tablet (40 mg total) by mouth daily.   bacitracin ointment Apply 1 Application topically 3 (three) times daily.   bismuth subsalicylate (PEPTO BISMOL) 262 MG/15ML suspension Take 30 mLs by mouth every 6 (six) hours as needed for indigestion.   Budeson-Glycopyrrol-Formoterol (BREZTRI AEROSPHERE) 160-9-4.8 MCG/ACT AERO Inhale 2 puffs into the lungs in the morning and at bedtime.   clonazePAM (KLONOPIN) 1 MG tablet Take 0.5-1 mg by  mouth 4 (four) times daily as needed for anxiety.   COMBIVENT RESPIMAT 20-100 MCG/ACT AERS respimat Inhale 1 puff into the lungs in the morning and at bedtime.   escitalopram (LEXAPRO) 10 MG tablet Take 10 mg by mouth at bedtime.   furosemide (LASIX) 40 MG tablet Take 1 tablet (40 mg total) by mouth every Monday, Wednesday, and Friday. May take for weight gain of 3 lbs over night or 5 lbs in a week   gabapentin (NEURONTIN) 300 MG capsule Take 300 mg by mouth 3 (three) times daily.   HYDROcodone-acetaminophen (NORCO) 10-325 MG tablet Take 1 tablet by mouth every 4 (four) hours as needed (severe back pain.).   metoprolol tartrate (LOPRESSOR) 25 MG tablet Take 0.5 tablets (12.5 mg total) by mouth 2 (two) times daily.   nitroGLYCERIN (NITROSTAT) 0.4 MG SL tablet Place 1 tablet (0.4 mg total) under the tongue every 5 (five) minutes as needed for chest pain.   pantoprazole (PROTONIX) 40 MG tablet Take 40 mg by mouth at bedtime.   potassium chloride (KLOR-CON) 10 MEQ tablet Take 1 tablet (10 mEq  total) by mouth every Monday, Wednesday, and Friday. Take 1 tab when you take the furosemide/Lasix tablet     Allergies:   Chantix [varenicline], Codeine, and Oxycodone   Past Medical History:  Diagnosis Date   Arthritis    CHF (congestive heart failure) (HCC)    Chronic back pain    COPD (chronic obstructive pulmonary disease) (HCC)    patient is on oxygen at night when needed   Depression    GERD (gastroesophageal reflux disease)    Heart failure (HCC)    RV   Hypertension    Neuropathy    Pulmonary HTN (HCC)    Thyroid disease    Ulcer    Billroth I    Past Surgical History:  Procedure Laterality Date   ABDOMINAL HYSTERECTOMY     BACK SURGERY     X4   BILROTH I PROCEDURE     BIOPSY  12/18/2019   Procedure: BIOPSY;  Surgeon: Daneil Dolin, MD;  [facility];  Service: Endoscopy;;   BIOPSY  12/27/2020   Procedure: BIOPSY;  Surgeon: Daneil Dolin, MD;  [facility];  Service: Endoscopy;;   CHOLECYSTECTOMY     COLONOSCOPY  05/18/2003   JME:[id number] colonoscopy/ Internal hemorrhoids.  Otherwise, normal rectum   COLONOSCOPY  08/12/2010   Dr. Arnoldo Morale: cecum visualized and normal, colon and rectum normal. Torturous colon   COLONOSCOPY N/A 09/07/2012   RMR: tubular adenoma, lipoma. Due for surveillance in 2021   COLONOSCOPY WITH PROPOFOL N/A 12/18/2019   Rourk: Three 2-7m polyps removed from the descending colon, tubular adenomas.  Diverticulosis.  Noncompliant left colon.   ESOPHAGOGASTRODUODENOSCOPY  05/18/2003   RMR:. Normal esophagus/Adenomatous-appearing mucosa at the anastomosis   ESOPHAGOGASTRODUODENOSCOPY  08/12/2010   Dr. JArnoldo Morale anastomosis widely patent, no ulcerations, CLO test negative   ESOPHAGOGASTRODUODENOSCOPY (EGD) WITH PROPOFOL N/A 12/18/2019   Rourk: Normal esophagus status post dilation.  Prior hemigastrectomy.  Small bowel nodule of uncertain significance.  Biopsy with mild reactive changes and focal intestinal metaplasia.  Recommend 1 year follow-up EGD.   ESOPHAGOGASTRODUODENOSCOPY (EGD) WITH PROPOFOL N/A 12/27/2020   Rourk:Normal esophagus s/p dilation, s/p hemigastrectomy, Billroth I configuration, anastomotic nodule biopsied, polypoid duodenal mucosa with surface gastric foveolar metaplasia s/p peptic injury, no malignancy   MALONEY DILATION N/A 12/18/2019   Procedure: MALONEY DILATION;  Surgeon: RDaneil Dolin MD;  [facility];  Service: Endoscopy;  Laterality: N/A;   MALONEY DILATION N/A 12/27/2020   Procedure: MVenia MinksDILATION;  Surgeon: RDaneil Dolin MD;  [facility];  Service: Endoscopy;  Laterality: N/A;   POLYPECTOMY  12/18/2019   Procedure: POLYPECTOMY;  Surgeon: RDaneil Dolin MD;  [facility];  Service: Endoscopy;;   RIGHT HEART CATH N/A 05/04/2018   Procedure: RIGHT HEART CATH;  Surgeon: MLarey Dresser MD;  [facility];  Service: Cardiovascular;   Laterality: N/A;   RIGHT/LEFT HEART CATH AND CORONARY ANGIOGRAPHY N/A 06/06/2021   Procedure: RIGHT/LEFT HEART CATH AND CORONARY ANGIOGRAPHY;  Surgeon: JMartinique Peter M, MD;  [facility];  Service: Cardiovascular;  Laterality: N/A;   STOMACH SURGERY     removed partial stomach   YAG LASER APPLICATION Left 129/79/8921  Procedure: YAG LASER APPLICATION;  Surgeon: MRutherford Guys MD;  Location: [facility];  Service: Ophthalmology;  Laterality: Left;   YAG LASER APPLICATION Right 119/41/7408  Procedure: YAG LASER APPLICATION;  Surgeon: MRutherford Guys MD;  Location: [facility];  Service:  Ophthalmology;  Laterality: Right;     Social History:  The patient  reports that she has been smoking cigarettes. She has a 56.00 pack-year smoking history. She has never used smokeless tobacco. She reports that she does not drink alcohol and does not use drugs.   Family History:  The patient's family history includes Cancer in her maternal aunt, paternal aunt, and sister; Cancer (age of onset: 34) in her brother.    ROS:  Please see the history of present illness. All other systems are reviewed and  Negative to the above problem except as noted.    PHYSICAL EXAM: VS:  BP (!) 140/82   Pulse 74   Ht _0  (1.676 m)   Wt 144 lb 6.4 oz (65.5 kg)   SpO2 (!) 86%   BMI 23.31 kg/m   GEN: Well nourished, well developed, in no acute distress  HEENT: normal  Neck: no JVD, carotid bruit Cardiac: RRR; no murmurs  No LE  edema  Respiratory:  Severely decreased airflow  GI: soft, nontender, nondistended, + BS  No hepatomegaly  MS: no deformity Moving all extremities   Skin: warm and dry, no rash Neuro:  Strength and sensation are intact Psych: euthymic mood, full affect   EKG:  EKG is not ordered today.  CARDIAC CATH: R/L3 06/2021   Prox RCA to Mid RCA lesion is 30% stenosed.   Dist LAD lesion is 25% stenosed.   LV end diastolic pressure is mildly elevated.   Hemodynamic findings consistent with  moderate pulmonary hypertension.   Mild nonobstructive CAD Moderate pulmonary HTN. PAP mean 35 mm Hg. PVR 7 WU Mild elevation of LV filling pressures. PCWP mean 15 mm Hg. LVEDP 16 mm Hg Normal cardiac output. Index 2.83.    Plan: medical management. Pulmonary pressures have not changed significantly since prior right  heart cath in 2020.    ECHO: 06/06/2021  1. Left ventricular ejection fraction, by estimation, is 60 to 65%. The  left ventricle has normal function. The left ventricle has no regional  wall motion abnormalities. There is mild left ventricular hypertrophy.  Left ventricular diastolic parameters are consistent with Grade I diastolic dysfunction (impaired relaxation). Elevated left atrial pressure.   2. Ventricular septum is flattened in systole suggesting RV pressure  overload. . Right ventricular systolic function moderately to severely  reduced. The right ventricular size is moderately to severely dilated.  There is moderately elevated pulmonary artery systolic pressure, 03.8.  3. Left atrial size was moderately dilated.   4. Right atrial size was moderately dilated.   5. The mitral valve is normal in structure. Trivial mitral valve  regurgitation. No evidence of mitral stenosis.   6. The tricuspid valve is abnormal. Tricuspid valve regurgitation is  moderate.   7. The aortic valve was not well visualized. There is mild calcification  of the aortic valve. There is mild thickening of the aortic valve. Aortic  valve regurgitation is not visualized. No aortic stenosis is present.   8. The inferior vena cava is dilated in size with <50% respiratory  variability, suggesting right atrial pressure of 15 mmHg.  Lipid Panel    Component Value Date/Time   CHOL 138 06/07/2021 0324   TRIG 91 06/07/2021 0324   HDL 49 06/07/2021 0324   CHOLHDL 2.8 06/07/2021 0324   VLDL 18 06/07/2021 0324   LDLCALC 71 06/07/2021 0324      Wt Readings from Last 3 Encounters:  03/06/22 144 lb  6.4 oz (65.5 kg)  11/25/21 128 lb 14.4 oz (58.5 kg)  10/13/21 127 lb (57.6 kg)      ASSESSMENT AND PLAN:  1  HFpEF   Pt's volume status is good   Keep on current regimen   2  Hx CAD   Mild to mod at cath. NSTEMI due to ? Spasm    Pt remains symptom free e  Follow      3  HTN  BP better at home   Bring cuff to next visit   Keep on same regimne  4 HL Keep on Crstor   LDL 71      5  COPD  Pt on O2 at times   O2 better on it  Encouraged use         6  Tob  Continue to counsel   Down to 3 to 4 a day    Follow up in July     Current medicines are reviewed at length with the patient today.  The patient does not have concerns regarding medicines.  Signed, Dorris Carnes, MD  03/06/2022 2:49 PM    [address], [place], [place]  [address] Phone: [phone]; Fax: [phone]

## 2022-03-06 ENCOUNTER — Ambulatory Visit: Payer: Medicare Other | Attending: Internal Medicine | Admitting: Internal Medicine

## 2022-03-06 ENCOUNTER — Encounter: Payer: Self-pay | Admitting: Internal Medicine

## 2022-03-06 VITALS — BP 140/82 | HR 74 | Ht 66.0 in | Wt 144.4 lb

## 2022-03-06 DIAGNOSIS — I2510 Atherosclerotic heart disease of native coronary artery without angina pectoris: Secondary | ICD-10-CM

## 2022-03-06 NOTE — Patient Instructions (Signed)
Medication Instructions:  Your physician recommends that you continue on your current medications as directed. Please refer to the Current Medication list given to you today.   Labwork: None  Testing/Procedures: None  Follow-Up: Follow up with Dr. Harrington Challenger in June/July 2024.  Any Other Special Instructions Will Be Listed Below (If Applicable).     If you need a refill on your cardiac medications before your next appointment, please call your pharmacy.

## 2022-03-11 ENCOUNTER — Ambulatory Visit: Payer: Medicare Other | Admitting: Gastroenterology

## 2022-03-11 ENCOUNTER — Encounter: Payer: Self-pay | Admitting: Gastroenterology

## 2022-03-11 ENCOUNTER — Telehealth: Payer: Self-pay | Admitting: *Deleted

## 2022-03-11 VITALS — BP 129/60 | HR 81 | Temp 97.6°F | Ht 66.0 in | Wt 144.0 lb

## 2022-03-11 DIAGNOSIS — R1319 Other dysphagia: Secondary | ICD-10-CM

## 2022-03-11 DIAGNOSIS — R600 Localized edema: Secondary | ICD-10-CM | POA: Diagnosis not present

## 2022-03-11 NOTE — Progress Notes (Signed)
GI Office Note    Referring Provider: Lemmie Evens, MD Primary Care Physician:  Lemmie Evens, MD  Primary Gastroenterologist: Garfield Cornea, MD   Chief Complaint   Chief Complaint  Patient presents with   Follow-up    Pt having trouble swallowing food again    History of Present Illness   [name] is a 76 y.o. female presenting today for dysphagia. Last seen in office 10/2020. EGD 12/2020 for dysphagia, see findings below.   EGD/ED seemed to help last time but having issues swallowing again. Feels like everything gets stuck in throat region. Eventually goes down. Happens with liquids, pills, solids. Then feels bloated. No heartburn. BM every 2 days but good productive stools. No melena, brbpr. No recent weight loss. Patient admitted 06/2021 for NSTEM, underwent heart cath, mild CAD noted. ?due to spasm. She has HFpEF. She notes she is under stress caring for her sick husband.  States she has had issues with lower extremity edema this year. Overall improved, dropped a lot of weight. She noted that her left leg is swollen more than her right.   EGD 12/2020: -Normal esophagus. Dilated. -Prior hemigastrectomy with Billroth I configuration. Anastomotic nodule biopsied with benign findings    Medications   Current Outpatient Medications  Medication Sig Dispense Refill   amLODipine (NORVASC) 5 MG tablet Take 1 tablet (5 mg total) by mouth daily. 90 tablet 3   aspirin EC 81 MG EC tablet Take 1 tablet (81 mg total) by mouth daily. Swallow whole. 30 tablet 11   atorvastatin (LIPITOR) 40 MG tablet Take 1 tablet (40 mg total) by mouth daily. 30 tablet 11   bacitracin ointment Apply 1 Application topically 3 (three) times daily. 120 g 0   clonazePAM (KLONOPIN) 1 MG tablet Take 0.5-1 mg by mouth 4 (four) times daily as needed for anxiety.     COMBIVENT RESPIMAT 20-100 MCG/ACT AERS respimat Inhale 1 puff into the lungs in the morning and at bedtime.     escitalopram  (LEXAPRO) 10 MG tablet Take 10 mg by mouth at bedtime.     furosemide (LASIX) 40 MG tablet Take 1 tablet (40 mg total) by mouth every Monday, Wednesday, and Friday. May take for weight gain of 3 lbs over night or 5 lbs in a week 30 tablet 3   gabapentin (NEURONTIN) 300 MG capsule Take 300 mg by mouth 3 (three) times daily.     HYDROcodone-acetaminophen (NORCO) 10-325 MG tablet Take 1 tablet by mouth every 4 (four) hours as needed (severe back pain.).     pantoprazole (PROTONIX) 40 MG tablet Take 40 mg by mouth at bedtime.     potassium chloride (KLOR-CON) 10 MEQ tablet Take 1 tablet (10 mEq total) by mouth every Monday, Wednesday, and Friday. Take 1 tab when you take the furosemide/Lasix tablet 30 tablet 2   nitroGLYCERIN (NITROSTAT) 0.4 MG SL tablet Place 1 tablet (0.4 mg total) under the tongue every 5 (five) minutes as needed for chest pain. (Patient not taking: Reported on 03/11/2022) 25 tablet 3   No current facility-administered medications for this visit.    Allergies   Allergies as of 03/11/2022 - Review Complete 03/11/2022  Allergen Reaction Noted   Chantix [varenicline] Nausea And Vomiting 12/01/2016   Codeine Other (See Comments) 12/01/2016   Oxycodone Palpitations 01/11/2017       Review of Systems   General: Negative for anorexia, weight loss, fever, chills, fatigue, weakness. ENT: Negative for hoarseness,  nasal congestion.see  hpi CV: Negative for chest pain, angina, palpitations, dyspnea on exertion, +peripheral edema.  Respiratory: Negative for dyspnea at rest, dyspnea on exertion, cough, sputum, wheezing.  GI: See history of present illness. GU:  Negative for dysuria, hematuria, urinary incontinence, urinary frequency, nocturnal urination.  Endo: Negative for unusual weight change.     Physical Exam   BP 129/60   Pulse 81   Temp 97.6 F (36.4 C)   Ht _0  (1.676 m)   Wt 144 lb (65.3 kg)   BMI 23.24 kg/m    General: Well-nourished, well-developed in no acute  distress.  Eyes: No icterus. Mouth: Oropharyngeal mucosa moist and pink , no lesions erythema or exudate. Lungs: scattered wheezes bilaterally.  Heart: Regular rate and rhythm, no murmurs rubs or gallops.  Abdomen: Bowel sounds are normal,  nondistended, no hepatosplenomegaly or masses,  no abdominal bruits or hernia , no rebound or guarding. Mild epig tenderness Rectal: not performed Extremities: 2+left LE, 1+ right LE edema. No clubbing or deformities. Neuro: Alert and oriented x 4   Skin: Warm and dry, no jaundice.   Psych: Alert and cooperative, normal mood and affect.  Labs   Lab Results  Component Value Date   CREATININE 0.61 10/29/2021   BUN 13 10/29/2021   NA 137 10/29/2021   K 3.6 10/29/2021   CL 97 (L) 10/29/2021   CO2 31 10/29/2021   Lab Results  Component Value Date   WBC 7.9 09/19/2021   HGB 11.7 (L) 09/19/2021   HCT 41.4 09/19/2021   MCV 92.0 09/19/2021   PLT 274 09/19/2021   Lab Results  Component Value Date   ALT 10 06/05/2021   AST 17 06/05/2021   ALKPHOS 71 06/05/2021   BILITOT 0.8 06/05/2021    Imaging Studies   DG Epidural/Nerve Root  Result Date: 02/24/2022 CLINICAL DATA:  Lumbosacral spondylosis without myelopathy with radiculopathy. Low back and right groin pain. Positive response to multiple prior right L3 nerve root blocks. EXAM: EPIDURAL/NERVE ROOT FLUOROSCOPY: Radiation Exposure Index (as provided by the fluoroscopic device): 3.70 mGy Kerm[place] PROCEDURE: The procedure, risks, benefits, and alternatives were explained to the patient. Questions regarding the procedure were encouraged and answered. The patient understands and consents to the procedure. RIGHT L3 NERVE ROOT BLOCK AND TRANSFORAMINAL EPIDURAL: A posterior oblique approach was taken to the intervertebral foramen on the right at L3-4 using a curved 3.5 inch 22 gauge spinal needle. Injection of Isovue-M 200 outlined the right L3 nerve root and showed good epidural spread. No vascular  opacification is seen. 80 mg of Depo-Medrol mixed with 2 mL of 1% lidocaine were instilled. The procedure was well-tolerated, and the patient was discharged thirty minutes following the injection in good condition. COMPLICATIONS: None IMPRESSION: Technically successful injection consisting of a right L3 nerve root block and transforaminal epidural. Electronically Signed   By: Logan Bores M.D.   On: 02/24/2022 13:07    Assessment   Dysphagia: recurrent. Esophageal normal at time of last EGD. Dilated helped short term.  History of prior hemigastrectomy with Billroth I configuration, no evidence of anastomotic stricture.  Question motility disorder or Zenker's diverticulum. Recommend barium swallow to evaluate.   LE edema: left lower extremity edema greater than right.    PLAN   US venous lower left (DVT). Once [place] completed, we will move towards BPE/UGI.  Continue PPI   Laureen Ochs. Bobby Rumpf, [place], [facility]

## 2022-03-11 NOTE — Telephone Encounter (Signed)
[place] is scheduled for 03/13/22 at 2:30 pm, arrive at 2:15 pm. Inform pt may be there for a while because she is being worked in.   [place]

## 2022-03-11 NOTE — Patient Instructions (Signed)
Complete ultrasound of your leg to rule out blood clot. We will be in touch with results.  Once ultrasound of leg complete, we will work towards swallowing study as discussed today.

## 2022-03-13 ENCOUNTER — Ambulatory Visit (HOSPITAL_COMMUNITY)
Admission: RE | Admit: 2022-03-13 | Discharge: 2022-03-13 | Disposition: A | Discharge status: Home / Self Care | Payer: Medicare Other | Source: Ambulatory Visit | Attending: Gastroenterology | Admitting: Gastroenterology

## 2022-03-13 DIAGNOSIS — R1319 Other dysphagia: Secondary | ICD-10-CM | POA: Diagnosis present

## 2022-03-13 DIAGNOSIS — R600 Localized edema: Secondary | ICD-10-CM | POA: Diagnosis present

## 2022-03-18 ENCOUNTER — Other Ambulatory Visit: Payer: Self-pay | Admitting: *Deleted

## 2022-03-18 DIAGNOSIS — R1319 Other dysphagia: Secondary | ICD-10-CM

## 2022-03-18 DIAGNOSIS — R6881 Early satiety: Secondary | ICD-10-CM

## 2022-03-24 ENCOUNTER — Ambulatory Visit (HOSPITAL_COMMUNITY)
Admission: RE | Admit: 2022-03-24 | Discharge: 2022-03-24 | Disposition: A | Discharge status: Home / Self Care | Payer: Medicare Other | Source: Ambulatory Visit | Attending: Gastroenterology | Admitting: Gastroenterology

## 2022-03-24 DIAGNOSIS — R1319 Other dysphagia: Secondary | ICD-10-CM | POA: Diagnosis not present

## 2022-03-24 DIAGNOSIS — R6881 Early satiety: Secondary | ICD-10-CM | POA: Diagnosis present

## 2022-03-27 ENCOUNTER — Telehealth: Payer: Self-pay | Admitting: Internal Medicine

## 2022-03-27 NOTE — Telephone Encounter (Signed)
Per Dr. Harrington Challenger:  Fay Records, MD  Berlinda Last, [place] OK to put in hospice order Dorris Carnes

## 2022-03-27 NOTE — Telephone Encounter (Signed)
Spoke to K. Fisher who stated that Dr. Karie Kirks responded to previous messages and has already put in the order for Hospice care.

## 2022-03-27 NOTE — Telephone Encounter (Signed)
Left message to return call.

## 2022-03-27 NOTE — Telephone Encounter (Signed)
Glennis Brink from [facility] called to talk with Dr. Harrington Challenger or nurse. Please call back at [phone]....Pensions consultant

## 2022-03-27 NOTE — Telephone Encounter (Signed)
[employer] received from K. Fisher:  From: Glennis Brink  Sent: 03/27/2022  11:39 AM EST   Subject: Hospice referral                               Good morning,  Could you ask the provider for [name] to place an order in epic for a hospice evaluation per the request of the patient's son. We currently care for her husband & our NP noticed that Mrs. Ploch could benefit from some additional support as well. Once the order is placed we will schedule an evaluation for her in the coming days. Her son will be present for the evaluation.  The patient's son asked that we get the order from you guys because her PCP is very slow to respond to requests (Dr. Newt Minion).   Sincerely,  Glennis Brink

## 2022-04-22 ENCOUNTER — Other Ambulatory Visit (HOSPITAL_COMMUNITY): Payer: Self-pay | Admitting: Family Medicine

## 2022-04-22 ENCOUNTER — Ambulatory Visit (HOSPITAL_COMMUNITY)
Admission: RE | Admit: 2022-04-22 | Discharge: 2022-04-22 | Disposition: A | Discharge status: Home / Self Care | Payer: Medicare Other | Source: Ambulatory Visit | Attending: Family Medicine | Admitting: Family Medicine

## 2022-04-22 DIAGNOSIS — M25551 Pain in right hip: Secondary | ICD-10-CM | POA: Insufficient documentation

## 2022-06-10 ENCOUNTER — Other Ambulatory Visit (HOSPITAL_COMMUNITY)
Admission: RE | Admit: 2022-06-10 | Discharge: 2022-06-10 | Disposition: A | Discharge status: Home / Self Care | Payer: Medicare Other | Source: Ambulatory Visit | Attending: Nurse Practitioner | Admitting: Nurse Practitioner

## 2022-06-10 ENCOUNTER — Encounter: Payer: Self-pay | Admitting: Nurse Practitioner

## 2022-06-10 ENCOUNTER — Ambulatory Visit (HOSPITAL_COMMUNITY)
Admission: RE | Admit: 2022-06-10 | Discharge: 2022-06-10 | Disposition: A | Discharge status: Home / Self Care | Payer: Medicare Other | Source: Ambulatory Visit | Attending: Nurse Practitioner | Admitting: Nurse Practitioner

## 2022-06-10 ENCOUNTER — Ambulatory Visit: Payer: Medicare Other | Attending: Nurse Practitioner | Admitting: Nurse Practitioner

## 2022-06-10 VITALS — BP 136/78 | HR 67 | Ht 66.0 in | Wt 143.6 lb

## 2022-06-10 DIAGNOSIS — J9621 Acute and chronic respiratory failure with hypoxia: Secondary | ICD-10-CM | POA: Diagnosis not present

## 2022-06-10 DIAGNOSIS — R0609 Other forms of dyspnea: Secondary | ICD-10-CM

## 2022-06-10 DIAGNOSIS — I5032 Chronic diastolic (congestive) heart failure: Secondary | ICD-10-CM

## 2022-06-10 DIAGNOSIS — Z720 Tobacco use: Secondary | ICD-10-CM

## 2022-06-10 DIAGNOSIS — I2510 Atherosclerotic heart disease of native coronary artery without angina pectoris: Secondary | ICD-10-CM

## 2022-06-10 DIAGNOSIS — J441 Chronic obstructive pulmonary disease with (acute) exacerbation: Secondary | ICD-10-CM

## 2022-06-10 DIAGNOSIS — I10 Essential (primary) hypertension: Secondary | ICD-10-CM

## 2022-06-10 DIAGNOSIS — E782 Mixed hyperlipidemia: Secondary | ICD-10-CM

## 2022-06-10 DIAGNOSIS — I272 Pulmonary hypertension, unspecified: Secondary | ICD-10-CM

## 2022-06-10 LAB — BASIC METABOLIC PANEL
Anion gap: 6 (ref 5–15)
BUN: 12 mg/dL (ref 8–23)
CO2: 31 mmol/L (ref 22–32)
Calcium: 8.7 mg/dL — ABNORMAL LOW (ref 8.9–10.3)
Chloride: 96 mmol/L — ABNORMAL LOW (ref 98–111)
Creatinine, Ser: 0.58 mg/dL (ref 0.44–1.00)
GFR, Estimated: >60 mL/min (ref >60)
Glucose, Bld: 86 mg/dL (ref 70–99)
Potassium: 4.1 mmol/L (ref 3.5–5.1)
Sodium: 133 mmol/L — ABNORMAL LOW (ref 135–145)

## 2022-06-10 LAB — BRAIN NATRIURETIC PEPTIDE: B Natriuretic Peptide: 57 pg/mL (ref 0.0–100.0)

## 2022-06-10 LAB — CBC
HCT: 40.8 % (ref 36.0–46.0)
Hemoglobin: 12.6 g/dL (ref 12.0–15.0)
MCH: 28.1 pg (ref 26.0–34.0)
MCHC: 30.9 g/dL (ref 30.0–36.0)
MCV: 91.1 fL (ref 80.0–100.0)
Platelets: 244 10*3/uL (ref 150–400)
RBC: 4.48 MIL/uL (ref 3.87–5.11)
RDW: 15.9 % — ABNORMAL HIGH (ref 11.5–15.5)
WBC: 5.5 10*3/uL (ref 4.0–10.5)
nRBC: 0 % (ref 0.0–0.2)

## 2022-06-10 NOTE — Patient Instructions (Signed)
Medication Instructions:  Your physician has recommended you make the following change in your medication:  -Increase Lasix to daily for the next 5 days. -Increase Potassium to daily for the next 5 days.  *If you need a refill on your cardiac medications before your next appointment, please call your pharmacy*   Lab Work: CBC BMET BNP  If you have labs (blood work) drawn today and your tests are completely normal, you will receive your results only by: [place] (if you have MyChart) OR A paper copy in the mail If you have any lab test that is abnormal or we need to change your treatment, we will call you to review the results.   Testing/Procedures: Chest X-Ray    Follow-Up: At [facility], you and your health needs are our priority.  As part of our continuing mission to provide you with exceptional heart care, we have created designated Provider Care Teams.  These Care Teams include your primary Cardiologist (physician) and Advanced Practice Providers (APPs -  Physician Assistants and Nurse Practitioners) who all work together to provide you with the care you need, when you need it.  We recommend signing up for the patient portal called "MyChart".  Sign up information is provided on this After Visit Summary.  MyChart is used to connect with patients for Virtual Visits (Telemedicine).  Patients are able to view lab/test results, encounter notes, upcoming appointments, etc.  Non-urgent messages can be sent to your provider as well.   To learn more about what you can do with MyChart, go to [email or web address].    Your next appointment:   1 week(s)  Provider:   Finis Bud, NP  Other Instructions

## 2022-06-10 NOTE — Progress Notes (Signed)
Office Visit    Patient Name: [name] Date of Encounter: 06/10/2022  Primary Care Provider:  Lemmie Evens, MD Primary Cardiologist:  Dorris Carnes, MD  Chief Complaint    77 year old female with a history of moderate to severe COPD, hypertension, nonobstructive CAD, chronic HFpEF, pulmonary hypertension, anxiety, depression, arthritis, and GERD, presents for follow-up related to dyspnea.  Past Medical History    Past Medical History:  Diagnosis Date   Arthritis    Chronic back pain    Chronic heart failure with preserved ejection fraction (HFpEF) ([place])    a. 06/2021 Echo: EF 60-65%, no rwma, GrI DD, mod-sev reduced RV fxn.   COPD (chronic obstructive pulmonary disease) ([place])    patient is on oxygen at night when needed   Depression    GERD (gastroesophageal reflux disease)    Hypertension    Neuropathy    Nonobstructive CAD (coronary artery disease)    a. 06/2021 NSTEMI/Cath: LM nl, LAD 25d, LCX nl, RCA 30p.   PAH (pulmonary artery hypertension) ([place])    a. 04/2018 PA 55/21 (36); b. 06/2021 Echo: EF 60-65%, no rwma, GrI DD, mod-sev reduced RV fxn, mod BAE, triv MR, mod TR; c. 06/2021 Cath: PA mean 35, PVR 7 WU.   Thyroid disease    Tobacco abuse    Ulcer    Billroth I   Past Surgical History:  Procedure Laterality Date   ABDOMINAL HYSTERECTOMY     BACK SURGERY     X4   BILROTH I PROCEDURE     BIOPSY  12/18/2019   Procedure: BIOPSY;  Surgeon: Daneil Dolin, MD;  [facility];  Service: Endoscopy;;   BIOPSY  12/27/2020   Procedure: BIOPSY;  Surgeon: Daneil Dolin, MD;  [facility];  Service: Endoscopy;;   CHOLECYSTECTOMY     COLONOSCOPY  05/18/2003   [id number] colonoscopy/ Internal hemorrhoids.  Otherwise, normal rectum   COLONOSCOPY  08/12/2010   Dr. Arnoldo Morale: cecum visualized and normal, colon and rectum normal. Torturous colon   COLONOSCOPY N/A 09/07/2012   RMR: tubular adenoma, lipoma. Due for surveillance in 2021    COLONOSCOPY WITH PROPOFOL N/A 12/18/2019   Rourk: Three 2-59m polyps removed from the descending colon, tubular adenomas.  Diverticulosis.  Noncompliant left colon.   ESOPHAGOGASTRODUODENOSCOPY  05/18/2003   RMR:. Normal esophagus/Adenomatous-appearing mucosa at the anastomosis   ESOPHAGOGASTRODUODENOSCOPY  08/12/2010   Dr. JArnoldo Morale anastomosis widely patent, no ulcerations, CLO test negative   ESOPHAGOGASTRODUODENOSCOPY (EGD) WITH PROPOFOL N/A 12/18/2019   Rourk: Normal esophagus status post dilation.  Prior hemigastrectomy.  Small bowel nodule of uncertain significance.  Biopsy with mild reactive changes and focal intestinal metaplasia.  Recommend 1 year follow-up EGD.   ESOPHAGOGASTRODUODENOSCOPY (EGD) WITH PROPOFOL N/A 12/27/2020   Rourk:Normal esophagus s/p dilation, s/p hemigastrectomy, Billroth I configuration, anastomotic nodule biopsied, polypoid duodenal mucosa with surface gastric foveolar metaplasia s/p peptic injury, no malignancy   MALONEY DILATION N/A 12/18/2019   Procedure: MALONEY DILATION;  Surgeon: RDaneil Dolin MD;  [facility];  Service: Endoscopy;  Laterality: N/A;   MALONEY DILATION N/A 12/27/2020   Procedure: MVenia MinksDILATION;  Surgeon: RDaneil Dolin MD;  [facility];  Service: Endoscopy;  Laterality: N/A;   POLYPECTOMY  12/18/2019   Procedure: POLYPECTOMY;  Surgeon: RDaneil Dolin MD;  [facility];  Service: Endoscopy;;   RIGHT HEART CATH N/A 05/04/2018   Procedure: RIGHT HEART CATH;  Surgeon: MLarey Dresser MD;  Location: [place] [facility];  Service: Cardiovascular;  Laterality: N/A;   RIGHT/LEFT HEART CATH AND CORONARY ANGIOGRAPHY N/A 06/06/2021   Procedure: RIGHT/LEFT HEART CATH AND CORONARY ANGIOGRAPHY;  Surgeon: Martinique, Peter M, MD;  Location: [place] [facility];  Service: Cardiovascular;  Laterality: N/A;   STOMACH SURGERY     removed partial stomach   YAG LASER APPLICATION Left [id number]   Procedure: YAG LASER  APPLICATION;  Surgeon: [name], MD;  Location: [facility];  Service: Ophthalmology;  Laterality: Left;   YAG LASER APPLICATION Right [id number]   Procedure: YAG LASER APPLICATION;  Surgeon: [name], MD;  Location: [facility];  Service: Ophthalmology;  Laterality: Right;    Allergies  Allergies  Allergen Reactions   Chantix [Varenicline] Nausea And Vomiting   Codeine Other (See Comments)    hallucinations   Opsumit [Macitentan]    Tadalafil     ? angioedema   Tyvaso [Treprostinil]    Oxycodone Palpitations    History of Present Illness    77 year old female with the above past medical history including moderate to severe COPD, hypertension, nonobstructive CAD, chronic HFpEF, pulmonary hypertension, anxiety, depression, arthritis, and GERD.  She was previously followed by Dr. Aundra Dubin in [place] with echo in January 2020 showing normal LV function with severely dilated RV and a PASP of 76 mmHg.  This was followed by heart catheterization which showed a PA 55/21 (36), a wedge of 7, cardiac index 1.6, and PVR of 10 Wood units.  CTA of the chest in January 2020 showed no PE or evidence of interstitial lung disease.  VQ scan in February 2020 showed no evidence of chronic PE.  She was unable to tolerate Opsumit or Tyvaso, and there was question of possible angioedema on tadalafil.  In March 2023, [name] was admitted with severe chest pressure and non-STEMI (peak troponin 1007 or 84).  Diagnostic catheterization showed mild, nonobstructive CAD while right heart cath showed a PA mean of 35, PVR of 7 Woods units, wedge of 15, and EDP of 16 mmHg.  Echo showed an EF of 60 to 65% with grade 1 diastolic dysfunction, moderately reduced RV function, moderate biatrial enlargement, and moderate TR.  She was medically managed.  She was seen again in the emergency department in June 2023 secondary to dyspnea and wheezing and required IV Lasix in the ED.  [name] was last seen in cardiology clinic  in December 2023 at which time she was felt to be euvolemic.  Over the course of the past few months, [name] has had a significant of life stress.  Her husband became terminally ill and subsequently died.  With this, her son reports that she has been smoking a lot more and over the past month, she has had a chronic productive cough with white sputum.  She has chronic dyspnea on exertion, but her son is also noted increased oxygen requirements.  She typically only uses oxygen at night but at times during the day at rest or with exertion, she might desaturate below 70%.  Saturations typically recover quickly by using oxygen.  Patient has been on hospice in that setting, has been out of touch with her primary care doctor and pulmonologist.  She does seem to get relief of her dyspnea and cough with inhalers.  She also uses Lasix 3 times a week.  Her weight is up since last summer but overall, has been stable since her last cardiology visit.  She has occasionally noted ankle swelling, though  this has not been significant recently.  She denies chest pain, palpitations, PND, orthopnea, dizziness, syncope, or early satiety.  Home Medications    Current Outpatient Medications  Medication Sig Dispense Refill   albuterol (2.5 MG/3ML) 0.083% NEBU 3 mL, albuterol (5 MG/ML) 0.5% NEBU 0.5 mL Inhale into the lungs.     amLODipine (NORVASC) 5 MG tablet Take 1 tablet (5 mg total) by mouth daily. 90 tablet 3   aspirin EC 81 MG EC tablet Take 1 tablet (81 mg total) by mouth daily. Swallow whole. 30 tablet 11   atorvastatin (LIPITOR) 40 MG tablet Take 1 tablet (40 mg total) by mouth daily. 30 tablet 11   clonazePAM (KLONOPIN) 1 MG tablet Take 0.5-1 mg by mouth 4 (four) times daily as needed for anxiety.     COMBIVENT RESPIMAT 20-100 MCG/ACT AERS respimat Inhale 1 puff into the lungs in the morning and at bedtime.     escitalopram (LEXAPRO) 10 MG tablet Take 10 mg by mouth at bedtime.     ferrous sulfate 325 (65 FE)  MG tablet Take 325 mg by mouth 3 (three) times a week.     furosemide (LASIX) 40 MG tablet Take 1 tablet (40 mg total) by mouth every Monday, Wednesday, and Friday. May take for weight gain of 3 lbs over night or 5 lbs in a week 30 tablet 3   gabapentin (NEURONTIN) 300 MG capsule Take 300 mg by mouth 3 (three) times daily.     HYDROcodone-acetaminophen (NORCO) 10-325 MG tablet Take 1 tablet by mouth every 4 (four) hours as needed (severe back pain.).     nitroGLYCERIN (NITROSTAT) 0.4 MG SL tablet Place 1 tablet (0.4 mg total) under the tongue every 5 (five) minutes as needed for chest pain. 25 tablet 3   OXYGEN Inhale 2 L into the lungs.     pantoprazole (PROTONIX) 40 MG tablet Take 40 mg by mouth at bedtime.     potassium chloride (KLOR-CON) 10 MEQ tablet Take 1 tablet (10 mEq total) by mouth every Monday, Wednesday, and Friday. Take 1 tab when you take the furosemide/Lasix tablet 30 tablet 2   vitamin B-12 (CYANOCOBALAMIN) 100 MCG tablet Take by mouth daily.     No current facility-administered medications for this visit.     Review of Systems    As above, chronic dyspnea exertion with increased cough, increased oxygen requirements, and mild ankle edema over the past month or so.  She denies chest pain, palpitations, PND, orthopnea, dizziness, syncope, or early satiety.  All other systems reviewed and are otherwise negative except as noted above.    Physical Exam    VS:  BP 136/78   Pulse 67   Ht '5\' 6"'$  (1.676 m)   Wt 143 lb 9.6 oz (65.1 kg)   SpO2 93%   BMI 23.18 kg/m  , BMI Body mass index is 23.18 kg/m.     GEN: Well nourished, well developed, in no acute distress. HEENT: normal. Neck: Supple, no JVD, carotid bruits, or masses. Cardiac: RRR, no murmurs, rubs, or gallops. No clubbing, cyanosis, trace bilateral ankle edema.  Radials 2+/PT 2+ and equal bilaterally.  Respiratory:  Respirations regular and unlabored, markedly diminished breath sound bilaterally. GI: Soft, nontender,  nondistended, BS + x 4. MS: no deformity or atrophy. Skin: warm and dry, no rash. Neuro:  Strength and sensation are intact. Psych: Normal affect.  Accessory Clinical Findings    Lab Results  Component Value Date   WBC 5.5 06/10/2022  HGB 12.6 06/10/2022   HCT 40.8 06/10/2022   MCV 91.1 06/10/2022   PLT 244 06/10/2022   Lab Results  Component Value Date   CREATININE 0.58 06/10/2022   BUN 12 06/10/2022   NA 133 (L) 06/10/2022   K 4.1 06/10/2022   CL 96 (L) 06/10/2022   CO2 31 06/10/2022   Lab Results  Component Value Date   ALT 10 06/05/2021   AST 17 06/05/2021   ALKPHOS 71 06/05/2021   BILITOT 0.8 06/05/2021   Lab Results  Component Value Date   CHOL 138 06/07/2021   HDL 49 06/07/2021   LDLCALC 71 06/07/2021   TRIG 91 06/07/2021   CHOLHDL 2.8 06/07/2021    Lab Results  Component Value Date   HGBA1C 5.4 06/07/2021    Assessment & Plan    1.  Acute on chronic respiratory failure/pulmonary arterial hypertension/chronic HFpEF: Patient with a history of severe COPD and ongoing tobacco abuse.  Over the past month, she has had increased cough with white sputum.  No fevers, chills, or sick contacts.  She has chronic dyspnea on exertion but over the past month, has had some increase in oxygen requirements throughout the day with exertion.  Saturation sometimes drop into the 70s or below but quickly recovered with application of oxygen, which she typically only wears at night.  Symptoms do seem to improve with use of inhalers.  Her weight is up about 20 pounds since last summer but stable since her last visit in December.  On examination she has markedly diminished breath sounds bilaterally with trace ankle edema.  I did obtain a BNP however, which returned normal at 57.  Labs otherwise unremarkable.  Chest x-ray with overall improved aeration compared to prior chest x-ray with mild interlobular septal thickening possibly reflecting early edema versus chronic changes.  I asked  her to take Lasix 40 mg daily for the next 5 days and then return back to her Monday Wednesday Friday regimen.  I have referred her back to pulmonology.  Her son asked for a spacer for her Combivent, and this can be obtained over-the-counter without a prescription.  Pending response - consider repeat echo.  2.  Essential hypertension: Stable on amlodipine 5 mg daily and Lasix 40 mg 3 times a week.  3.  Nonobstructive CAD: By catheterization in March 2023.  There was question of coronary vasospasm at that time.  She has not been having any chest pain and remains on aspirin, statin, and amlodipine therapy.  4.  Hyperlipidemia: LDL of 71 last March.  She remains on atorvastatin therapy.  5.  Tobacco abuse/COPD: Continues to smoke up to about a pack a day.  See #1.  She is not currently contemplating quitting.  Cessation advised.  Referred back to pulmonology.  6.  Disposition: Follow-up in clinic in the next 1 to 2 weeks.  Refer back to pulmonology.  Consider repeat echo pending response to therapy.  Murray Hodgkins, NP 06/10/2022, 5:38 PM

## 2022-06-22 ENCOUNTER — Other Ambulatory Visit: Payer: Self-pay | Admitting: Family Medicine

## 2022-06-22 DIAGNOSIS — M5459 Other low back pain: Secondary | ICD-10-CM

## 2022-06-26 ENCOUNTER — Ambulatory Visit: Payer: Medicare Other | Attending: Nurse Practitioner | Admitting: Nurse Practitioner

## 2022-06-26 ENCOUNTER — Encounter: Payer: Self-pay | Admitting: Nurse Practitioner

## 2022-06-26 VITALS — BP 120/70 | HR 70 | Ht 66.0 in | Wt 146.4 lb

## 2022-06-26 DIAGNOSIS — I10 Essential (primary) hypertension: Secondary | ICD-10-CM

## 2022-06-26 DIAGNOSIS — I5032 Chronic diastolic (congestive) heart failure: Secondary | ICD-10-CM

## 2022-06-26 DIAGNOSIS — I272 Pulmonary hypertension, unspecified: Secondary | ICD-10-CM

## 2022-06-26 DIAGNOSIS — I2510 Atherosclerotic heart disease of native coronary artery without angina pectoris: Secondary | ICD-10-CM | POA: Diagnosis not present

## 2022-06-26 DIAGNOSIS — E785 Hyperlipidemia, unspecified: Secondary | ICD-10-CM | POA: Diagnosis not present

## 2022-06-26 DIAGNOSIS — Z720 Tobacco use: Secondary | ICD-10-CM

## 2022-06-26 DIAGNOSIS — J439 Emphysema, unspecified: Secondary | ICD-10-CM

## 2022-06-26 NOTE — Progress Notes (Unsigned)
Office Visit    Patient Name: [name] Date of Encounter:06/26/2022  PCP:  Lemmie Evens, [facility]  Cardiologist:  Dorris Carnes, MD  Advanced Practice Provider:  No care team member to display Electrophysiologist:  None   Chief Complaint    [name] is a 77 y.o. female with a hx of nonobstructive CAD, hypertension, severe COPD, chronic HFpEF, pulmonary hypertension, and GERD, who presents today for 2 week follow-up.   Past Medical History    Past Medical History:  Diagnosis Date   Arthritis    Chronic back pain    Chronic heart failure with preserved ejection fraction (HFpEF) ([place])    a. 06/2021 Echo: EF 60-65%, no rwma, GrI DD, mod-sev reduced RV fxn.   COPD (chronic obstructive pulmonary disease) ([place])    patient is on oxygen at night when needed   Depression    GERD (gastroesophageal reflux disease)    Hypertension    Neuropathy    Nonobstructive CAD (coronary artery disease)    a. 06/2021 NSTEMI/Cath: LM nl, LAD 25d, LCX nl, RCA 30p.   PAH (pulmonary artery hypertension) ([place])    a. 04/2018 PA 55/21 (36); b. 06/2021 Echo: EF 60-65%, no rwma, GrI DD, mod-sev reduced RV fxn, mod BAE, triv MR, mod TR; c. 06/2021 Cath: PA mean 35, PVR 7 WU.   Thyroid disease    Tobacco abuse    Ulcer    Billroth I   Past Surgical History:  Procedure Laterality Date   ABDOMINAL HYSTERECTOMY     BACK SURGERY     X4   BILROTH I PROCEDURE     BIOPSY  12/18/2019   Procedure: BIOPSY;  Surgeon: Daneil Dolin, MD;  [facility];  Service: Endoscopy;;   BIOPSY  12/27/2020   Procedure: BIOPSY;  Surgeon: Daneil Dolin, MD;  [facility];  Service: Endoscopy;;   CHOLECYSTECTOMY     COLONOSCOPY  05/18/2003   [id number] colonoscopy/ Internal hemorrhoids.  Otherwise, normal rectum   COLONOSCOPY  08/12/2010   Dr. Arnoldo Morale: cecum visualized and normal, colon and rectum normal. Torturous colon   COLONOSCOPY N/A  09/07/2012   RMR: tubular adenoma, lipoma. Due for surveillance in 2021   COLONOSCOPY WITH PROPOFOL N/A 12/18/2019   Rourk: Three 2-87mm polyps removed from the descending colon, tubular adenomas.  Diverticulosis.  Noncompliant left colon.   ESOPHAGOGASTRODUODENOSCOPY  05/18/2003   RMR:. Normal esophagus/Adenomatous-appearing mucosa at the anastomosis   ESOPHAGOGASTRODUODENOSCOPY  08/12/2010   Dr. Arnoldo Morale: anastomosis widely patent, no ulcerations, CLO test negative   ESOPHAGOGASTRODUODENOSCOPY (EGD) WITH PROPOFOL N/A 12/18/2019   Rourk: Normal esophagus status post dilation.  Prior hemigastrectomy.  Small bowel nodule of uncertain significance.  Biopsy with mild reactive changes and focal intestinal metaplasia.  Recommend 1 year follow-up EGD.   ESOPHAGOGASTRODUODENOSCOPY (EGD) WITH PROPOFOL N/A 12/27/2020   Rourk:Normal esophagus s/p dilation, s/p hemigastrectomy, Billroth I configuration, anastomotic nodule biopsied, polypoid duodenal mucosa with surface gastric foveolar metaplasia s/p peptic injury, no malignancy   MALONEY DILATION N/A 12/18/2019   Procedure: MALONEY DILATION;  Surgeon: Daneil Dolin, MD;  [facility];  Service: Endoscopy;  Laterality: N/A;   MALONEY DILATION N/A 12/27/2020   Procedure: Venia Minks DILATION;  Surgeon: Daneil Dolin, MD;  [facility];  Service: Endoscopy;  Laterality: N/A;   POLYPECTOMY  12/18/2019   Procedure: POLYPECTOMY;  Surgeon: Daneil Dolin, MD;  [facility];  Service:  Endoscopy;;   RIGHT HEART CATH N/A 05/04/2018   Procedure: RIGHT HEART CATH;  Surgeon: Larey Dresser, MD;  Location: [facility];  Service: Cardiovascular;  Laterality: N/A;   RIGHT/LEFT HEART CATH AND CORONARY ANGIOGRAPHY N/A 06/06/2021   Procedure: RIGHT/LEFT HEART CATH AND CORONARY ANGIOGRAPHY;  Surgeon: Martinique, Peter M, MD;  Location: [facility];  Service: Cardiovascular;  Laterality: N/A;   STOMACH SURGERY     removed partial  stomach   YAG LASER APPLICATION Left [id number]   Procedure: YAG LASER APPLICATION;  Surgeon: [name], MD;  Location: [facility];  Service: Ophthalmology;  Laterality: Left;   YAG LASER APPLICATION Right [id number]   Procedure: YAG LASER APPLICATION;  Surgeon: [name], MD;  Location: [facility];  Service: Ophthalmology;  Laterality: Right;    Allergies  Allergies  Allergen Reactions   Chantix [Varenicline] Nausea And Vomiting   Codeine Other (See Comments)    hallucinations   Opsumit [Macitentan]    Tadalafil     ? angioedema   Tyvaso [Treprostinil]    Oxycodone Palpitations    History of Present Illness    [name] is a 77 y.o. female with a PMH as mentioned above.   TTE 04/2018  revealed normal EF,  PASP 76 mmHg, severely dilated RV.  Cath revealed   low heart filling pressures with low cardiac output, moderate pulmonary arterial hypertension with PVR 10 WU.  CT of the chest 04/2018 negative for PE or  ILD. VQ scan in 2020 negative for PE.  06/2021 admitted with NSTEMI.  Catheter revealed mild, nonobstructive CAD with right heart cath showed PA mean of 35, EDP of 60 mmHg.  Echo showed EF of 60 to 65%, grade 1 DD, moderately reduced RV function, moderate TR, moderate biatrial enlargement.  Medically managed.  June 2023, presented with wheezing/dyspnea, required IV Lasix.   Last seen by Ignacia Bayley, NP on June 10, 2022.  Patient reported stress (husband's passing) and therefore was smoking, was noted to have chronic productive cough with white sputum.  Son noted increased oxygen requirements.  At baseline, used oxygen at night but there were times oxygen would desaturate below 70%.  This would be improved by using oxygen.  Was noted to be in hospice setting and stated took Lasix 3 times a week. Overall was stable from a cardiac perspective. BNP normal. CXR with improved findings from prior with mild interlobular septal thickening.  Was asked to take Lasix 40 mg daily x 5  days, then return to Monday, Wednesday, and Friday regimen.  Was referred back to pulmonology and recommended to consider repeat echo pending response to therapy.  Today she presents for 2 week follow-up with her son. She states she is doing better, and son agrees, says she is close back to her baseline. Patient denies any chest pain, worsening shortness of breath, palpitations, syncope, presyncope, dizziness, orthopnea, PND, swelling or significant weight changes, acute bleeding, or claudication.  EKGs/Labs/Other Studies Reviewed:   The following studies were reviewed today:   EKG:  EKG is not ordered today.    Right and left heart cath 06/2021:   Prox RCA to Mid RCA lesion is 30% stenosed.   Dist LAD lesion is 25% stenosed.   LV end diastolic pressure is mildly elevated.   Hemodynamic findings consistent with moderate pulmonary hypertension.   Mild nonobstructive CAD Moderate pulmonary HTN. PAP mean 35 mm Hg. PVR 7 WU Mild elevation of LV filling pressures. PCWP mean 15  mm Hg. LVEDP 16 mm Hg Normal cardiac output. Index 2.83.    Plan: medical management. Pulmonary pressures have not changed significantly since prior right  heart cath in 2020.    Echo 06/2021:  1. Left ventricular ejection fraction, by estimation, is 60 to 65%. The  left ventricle has normal function. The left ventricle has no regional  wall motion abnormalities. There is mild left ventricular hypertrophy.  Left ventricular diastolic parameters  are consistent with Grade I diastolic dysfunction (impaired relaxation).  Elevated left atrial pressure.   2. Ventricular septum is flattened in systole suggesting RV pressure  overload. . Right ventricular systolic function moderately to severely  reduced. The right ventricular size is moderately to severely dilated.  There is moderately elevated pulmonary  artery systolic pressure.   3. Left atrial size was moderately dilated.   4. Right atrial size was moderately  dilated.   5. The mitral valve is normal in structure. Trivial mitral valve  regurgitation. No evidence of mitral stenosis.   6. The tricuspid valve is abnormal. Tricuspid valve regurgitation is  moderate.   7. The aortic valve was not well visualized. There is mild calcification  of the aortic valve. There is mild thickening of the aortic valve. Aortic  valve regurgitation is not visualized. No aortic stenosis is present.   8. The inferior vena cava is dilated in size with <50% respiratory  variability, suggesting right atrial pressure of 15 mmHg.   Recent Labs: 06/10/2022: B Natriuretic Peptide 57.0; BUN 12; Creatinine, Ser 0.58; Hemoglobin 12.6; Platelets 244; Potassium 4.1; Sodium 133  Recent Lipid Panel    Component Value Date/Time   CHOL 138 06/07/2021 0324   TRIG 91 06/07/2021 0324   HDL 49 06/07/2021 0324   CHOLHDL 2.8 06/07/2021 0324   VLDL 18 06/07/2021 0324   LDLCALC 71 06/07/2021 0324    Home Medications   Current Meds  Medication Sig   albuterol (2.5 MG/3ML) 0.083% NEBU 3 mL, albuterol (5 MG/ML) 0.5% NEBU 0.5 mL Inhale into the lungs.   amLODipine (NORVASC) 5 MG tablet Take 1 tablet (5 mg total) by mouth daily.   aspirin EC 81 MG EC tablet Take 1 tablet (81 mg total) by mouth daily. Swallow whole.   atorvastatin (LIPITOR) 40 MG tablet Take 1 tablet (40 mg total) by mouth daily.   clonazePAM (KLONOPIN) 1 MG tablet Take 0.5-1 mg by mouth 4 (four) times daily as needed for anxiety.   COMBIVENT RESPIMAT 20-100 MCG/ACT AERS respimat Inhale 1 puff into the lungs in the morning and at bedtime.   escitalopram (LEXAPRO) 10 MG tablet Take 10 mg by mouth at bedtime.   ferrous sulfate 325 (65 FE) MG tablet Take 325 mg by mouth 3 (three) times a week.   furosemide (LASIX) 40 MG tablet Take 1 tablet (40 mg total) by mouth every Monday, Wednesday, and Friday. May take for weight gain of 3 lbs over night or 5 lbs in a week   gabapentin (NEURONTIN) 300 MG capsule Take 300 mg by mouth  3 (three) times daily.   HYDROcodone-acetaminophen (NORCO) 10-325 MG tablet Take 1 tablet by mouth every 4 (four) hours as needed (severe back pain.).   nitroGLYCERIN (NITROSTAT) 0.4 MG SL tablet Place 1 tablet (0.4 mg total) under the tongue every 5 (five) minutes as needed for chest pain.   OXYGEN Inhale 2 L into the lungs.   pantoprazole (PROTONIX) 40 MG tablet Take 40 mg by mouth at bedtime.   potassium chloride (  KLOR-CON) 10 MEQ tablet Take 1 tablet (10 mEq total) by mouth every Monday, Wednesday, and Friday. Take 1 tab when you take the furosemide/Lasix tablet   vitamin B-12 (CYANOCOBALAMIN) 100 MCG tablet Take by mouth daily.     Review of Systems    All other systems reviewed and are otherwise negative except as noted above.  Physical Exam    VS:  BP 120/70   Pulse 70   Ht 5\' 6"  (1.676 m)   Wt 146 lb 6.4 oz (66.4 kg)   SpO2 91%   BMI 23.63 kg/m  , BMI Body mass index is 23.63 kg/m.  Wt Readings from Last 3 Encounters:  06/26/22 146 lb 6.4 oz (66.4 kg)  06/10/22 143 lb 9.6 oz (65.1 kg)  03/11/22 144 lb (65.3 kg)     GEN: Well nourished, well developed, in no acute distress. HEENT: normal. Neck: Supple, no JVD, carotid bruits, or masses. Cardiac: S1/S2, RRR, no murmurs, rubs, or gallops. No clubbing, cyanosis, edema.  Radials/PT 2+ and equal bilaterally.  Respiratory:  Respirations regular and unlabored, clear to auscultation bilaterally. MS: No deformity or atrophy. Skin: Warm and dry, no rash. Neuro:  Strength and sensation are intact. Psych: Normal affect.  Assessment & Plan    Chronic HFpEF TTE 06/2021 revealed EF 60 to 65%, grade 1 DD.  Well diuresed on 5 days of Lasix and had good response to tx per son's report. Euvolemic and well compensated on exam.  Continue Lasix 3 days/week-Monday/Wednesday/Friday, continue potassium supplementation. Low sodium diet, fluid restriction <2L, and daily weights encouraged. Educated to contact our office for weight gain of 2 lbs  overnight or 5 lbs in one week.  CAD Stable with no anginal symptoms. No indication for ischemic evaluation.  Continue aspirin, atorvastatin, and nitroglycerin as needed.  Heart healthy diet and regular exercise as tolerated encouraged.  HTN Blood pressure stable.  Continue current medication regimen. Discussed to monitor BP at home at least 2 hours after medications and sitting for 5-10 minutes. Heart healthy diet and regular cardiovascular exercise as tolerated encouraged.   HLD Continue atorvastatin. Heart healthy diet and regular cardiovascular exercise as tolerated encouraged. At next visit, recommend rechecking FLP and LFT.   COPD, pulmonary HTN, tobacco abuse Improvement in symptoms/breathing per son's report.  Discussed options in management, and d/t improvement in condition, will continue Lasix 3 days per week as mentioned above. If worsening symptoms by next follow-up, will consider Lasix x 5 days/ repeating Echo. Smoking cessation encouraged and discussed. Continue to follow-up with pulmonology.   Disposition: Follow up in 4-6week(s) with Dorris Carnes, MD or APP.  Signed, Finis Bud, NP [place]

## 2022-06-26 NOTE — Patient Instructions (Signed)
Medication Instructions:  Your physician recommends that you continue on your current medications as directed. Please refer to the Current Medication list given to you today.  Labwork: none  Testing/Procedures: none  Follow-Up: Your physician recommends that you schedule a follow-up appointment in: 4-6 weeks  Any Other Special Instructions Will Be Listed Below (If Applicable).  If you need a refill on your cardiac medications before your next appointment, please call your pharmacy. 

## 2022-06-29 ENCOUNTER — Other Ambulatory Visit: Payer: Self-pay | Admitting: Family Medicine

## 2022-06-29 ENCOUNTER — Ambulatory Visit
Admission: RE | Admit: 2022-06-29 | Discharge: 2022-06-29 | Disposition: A | Discharge status: Home / Self Care | Payer: Medicare Other | Source: Ambulatory Visit | Attending: Family Medicine | Admitting: Family Medicine

## 2022-06-29 DIAGNOSIS — M5459 Other low back pain: Secondary | ICD-10-CM

## 2022-06-29 MED ORDER — METHYLPREDNISOLONE ACETATE 40 MG/ML INJ SUSP (RADIOLOG
80.0000 mg | Freq: Once | INTRAMUSCULAR | Status: AC
  Administered 2022-06-29: 80 mg via EPIDURAL

## 2022-06-29 MED ORDER — IOPAMIDOL (ISOVUE-M 200) INJECTION 41%
1.0000 mL | Freq: Once | INTRAMUSCULAR | Status: AC
  Administered 2022-06-29: 1 mL via EPIDURAL

## 2022-06-29 NOTE — Discharge Instructions (Signed)

## 2022-06-30 ENCOUNTER — Other Ambulatory Visit (HOSPITAL_COMMUNITY): Payer: Self-pay | Admitting: Family Medicine

## 2022-06-30 DIAGNOSIS — Z1231 Encounter for screening mammogram for malignant neoplasm of breast: Secondary | ICD-10-CM

## 2022-07-03 ENCOUNTER — Ambulatory Visit (INDEPENDENT_AMBULATORY_CARE_PROVIDER_SITE_OTHER)

## 2022-07-03 ENCOUNTER — Encounter: Payer: Self-pay | Admitting: Nurse Practitioner

## 2022-07-03 ENCOUNTER — Ambulatory Visit: Admitting: Nurse Practitioner

## 2022-07-03 VITALS — BP 126/68 | HR 78 | Temp 98.4°F | Ht 66.0 in | Wt 151.2 lb

## 2022-07-03 DIAGNOSIS — J9611 Chronic respiratory failure with hypoxia: Secondary | ICD-10-CM | POA: Diagnosis not present

## 2022-07-03 DIAGNOSIS — I272 Pulmonary hypertension, unspecified: Secondary | ICD-10-CM

## 2022-07-03 DIAGNOSIS — I509 Heart failure, unspecified: Secondary | ICD-10-CM | POA: Diagnosis not present

## 2022-07-03 DIAGNOSIS — J9620 Acute and chronic respiratory failure, unspecified whether with hypoxia or hypercapnia: Secondary | ICD-10-CM | POA: Insufficient documentation

## 2022-07-03 DIAGNOSIS — J9621 Acute and chronic respiratory failure with hypoxia: Secondary | ICD-10-CM

## 2022-07-03 DIAGNOSIS — J441 Chronic obstructive pulmonary disease with (acute) exacerbation: Secondary | ICD-10-CM | POA: Diagnosis not present

## 2022-07-03 MED ORDER — PREDNISONE 20 MG PO TABS: 40.0000 mg | ORAL_TABLET | Freq: Every day | ORAL | 0 refills | Status: AC

## 2022-07-03 MED ORDER — BREZTRI AEROSPHERE 160-9-4.8 MCG/ACT IN AERO: 2.0000 | INHALATION_SPRAY | Freq: Two times a day (BID) | RESPIRATORY_TRACT | 11 refills | Status: DC

## 2022-07-03 NOTE — Progress Notes (Signed)
@Patient  ID: [name], female    DOB: 1946/01/10, 77 y.o.   MRN: [id number]  Chief Complaint  Patient presents with   Follow-up    Sob at rest, less endurance, cough (clear to white sputum) 2L o2     Referring provider: Lemmie Evens, MD  HPI: 77 year old female, active smoker followed for COPD and chronic respiratory failure on supplemental O2. She is a patient of Dr. Glenetta Hew and last seen in office 09/22/2021. Past medical history significant for CHF, multifactorial PH previously on opsumit (stopped d/t AE) and tadalafi (?angioedema - no longer on this), GERD.   TEST/EVENTS:  05/12/2018 PFT: FVC 61, FEV1 57, ratio 72, TLC 93, DLCO 51.  Positive BD 06/06/2021 echo: EF 6065%.  Mild LVH.  G1 DD.  RV function moderately to severely reduced.  Moderately elevated PASP.  RA and LA moderately dilated.  Trivial MR.  Moderate TR. 06/10/2022 CXR 2 view: Cardiomegaly.  Improved aeration of the lungs compared to prior.  Interval resolution of pleural effusions.  Emphysematous changes of the lungs.  No acute airspace disease.  09/22/2021: Ov with Dr. Verlee Monte. In [facility] ED for either AECOPD or decompensated CHF/PH. CXR suggestive of worsened extravascular volume overload relative to prior. Per son, she's been off of lasix for some time. She's back on this and doing better. Using combivent BID and albuterol q 4 hr. She has a chronic smoker's cough. Still smoking 5-10 cigarettes daily. She has been on Tyvaso for PH in the past but didn't tolerate that. Ordered pulmonary rehab. Trial on Breztri. Dry weight apparently around 135 lb; would weigh daily   07/03/2022: Today - acute Patient presents today with her son for acute visit.  They tell me that since the beginning of this month, she has been having more trouble with her breathing.  Her husband unfortunately passed away and so she has been smoking a lot more to deal with her anxiety.  She has also not been very compliant with her medications.  She saw  cardiology on 3/6 and she was treated for volume overload with increased course of Lasix for 5 days.  She typically only takes this on Monday Wednesday Friday; although, her son tells me that she is not very consistent with this regimen either.  She did feel better after this Lasix but after coming off of it, she started coughing a little bit more and had some scant pink frothy sputum.  Last time this happened was around 10 days ago.  Her cough seems to have improved some and she is only producing a small amount of white phlegm, which is typical for her.  Unfortunately, still having increased shortness of breath from her baseline.  Her feet are little more swollen than they usually are and her weight is up from her baseline as well.  She has not noticed much wheezing.  Denies any fevers, chills, mass hemoptysis, orthopnea, PND, dizziness or lightheadedness, chest pain.  She is not currently on any maintenance therapies.  She does use her Combivent inhaler which helps some.  She has tried Librarian, academic in the past and thinks that this helped.  She is on supplemental oxygen.  She had had some low oxygen levels at home, which prompted her initial cardiac evaluation.  She had dropped into the 70s.  She has not been routinely monitoring them since.  She was seen in cardiology on 3/22 again with O2 sat 91% on 3 L POC.  Her weight at that time  was 146 pounds.  She was encouraged to work on low-sodium diet and monitor her weights closely.  She was also advised to follow-up with pulmonology to discuss better management of her COPD.  Allergies  Allergen Reactions   Chantix [Varenicline] Nausea And Vomiting   Codeine Other (See Comments)    hallucinations   Opsumit [Macitentan]    Tadalafil     ? angioedema   Tyvaso [Treprostinil]    Oxycodone Palpitations    Immunization History  Administered Date(s) Administered   PFIZER(Purple Top)SARS-COV-2 Vaccination 06/01/2019, 06/27/2019   Tdap 11/25/2021    Past Medical  History:  Diagnosis Date   Arthritis    Chronic back pain    Chronic heart failure with preserved ejection fraction (HFpEF) ([place])    a. 06/2021 Echo: EF 60-65%, no rwma, GrI DD, mod-sev reduced RV fxn.   COPD (chronic obstructive pulmonary disease) ([place])    patient is on oxygen at night when needed   Depression    GERD (gastroesophageal reflux disease)    Hypertension    Neuropathy    Nonobstructive CAD (coronary artery disease)    a. 06/2021 NSTEMI/Cath: LM nl, LAD 25d, LCX nl, RCA 30p.   PAH (pulmonary artery hypertension) ([place])    a. 04/2018 PA 55/21 (36); b. 06/2021 Echo: EF 60-65%, no rwma, GrI DD, mod-sev reduced RV fxn, mod BAE, triv MR, mod TR; c. 06/2021 Cath: PA mean 35, PVR 7 WU.   Thyroid disease    Tobacco abuse    Ulcer    [place] I    Tobacco History: Social History   Tobacco Use  Smoking Status Every Day   Packs/day: 1.00   Years: 56.00   Additional pack years: 0.00   Total pack years: 56.00   Types: Cigarettes  Smokeless Tobacco Never  Tobacco Comments   1/2 ppd 09/22/21    Ready to quit: Not Answered Counseling given: Not Answered Tobacco comments: 1/2 ppd 09/22/21    Outpatient Medications Prior to Visit  Medication Sig Dispense Refill   albuterol (2.5 MG/3ML) 0.083% NEBU 3 mL, albuterol (5 MG/ML) 0.5% NEBU 0.5 mL Inhale into the lungs.     amLODipine (NORVASC) 5 MG tablet Take 1 tablet (5 mg total) by mouth daily. 90 tablet 3   aspirin EC 81 MG EC tablet Take 1 tablet (81 mg total) by mouth daily. Swallow whole. 30 tablet 11   atorvastatin (LIPITOR) 40 MG tablet Take 1 tablet (40 mg total) by mouth daily. 30 tablet 11   clonazePAM (KLONOPIN) 1 MG tablet Take 0.5-1 mg by mouth 4 (four) times daily as needed for anxiety.     escitalopram (LEXAPRO) 10 MG tablet Take 10 mg by mouth at bedtime.     ferrous sulfate 325 (65 FE) MG tablet Take 325 mg by mouth 3 (three) times a week.     furosemide (LASIX) 40 MG tablet Take 1 tablet (40 mg total) by mouth  every Monday, Wednesday, and Friday. May take for weight gain of 3 lbs over night or 5 lbs in a week 30 tablet 3   gabapentin (NEURONTIN) 300 MG capsule Take 300 mg by mouth 3 (three) times daily.     HYDROcodone-acetaminophen (NORCO) 10-325 MG tablet Take 1 tablet by mouth every 4 (four) hours as needed (severe back pain.).     nitroGLYCERIN (NITROSTAT) 0.4 MG SL tablet Place 1 tablet (0.4 mg total) under the tongue every 5 (five) minutes as needed for chest pain. 25 tablet 3  OXYGEN Inhale 2 L into the lungs.     pantoprazole (PROTONIX) 40 MG tablet Take 40 mg by mouth at bedtime.     potassium chloride (KLOR-CON) 10 MEQ tablet Take 1 tablet (10 mEq total) by mouth every Monday, Wednesday, and Friday. Take 1 tab when you take the furosemide/Lasix tablet 30 tablet 2   vitamin B-12 (CYANOCOBALAMIN) 100 MCG tablet Take by mouth daily.     COMBIVENT RESPIMAT 20-100 MCG/ACT AERS respimat Inhale 1 puff into the lungs in the morning and at bedtime.     No facility-administered medications prior to visit.     Review of Systems:   Constitutional: No night sweats, fevers, chills, +fatigue, 5 lb weight gain HEENT: No headaches, difficulty swallowing, tooth/dental problems, or sore throat. No sneezing, itching, ear ache, nasal congestion, or post nasal drip CV:  +pink, frothy sputum (resolved); leg swelling. No chest pain, orthopnea, PND, anasarca, dizziness, palpitations, syncope Resp: +shortness of breath with exertion; occasional cough with white phlegm. No excess mucus or change in color of mucus. No hemoptysis. No wheezing.  No chest wall deformity GI:  No heartburn, indigestion, abdominal pain, nausea, vomiting, diarrhea, change in bowel habits, loss of appetite, bloody stools.  GU: No dysuria, change in color of urine, urgency or frequency.  Skin: No rash, lesions, ulcerations MSK:  No joint pain or swelling.   Neuro: No dizziness or lightheadedness.  Psych: +grief, anxiety. No SI/HI. Mood  stable.     Physical Exam:  BP 126/68   Pulse 78   Temp 98.4 F (36.9 C) (Oral)   Ht 5\' 6"  (1.676 m)   Wt 151 lb 3.2 oz (68.6 kg)   SpO2 90%   BMI 24.40 kg/m   GEN: Pleasant, interactive, chronically-ill appearing; in no acute distress HEENT:  Normocephalic and atraumatic. PERRLA. Sclera white. Nasal turbinates pink, moist and patent bilaterally. No rhinorrhea present. Oropharynx pink and moist, without exudate or edema. No lesions, ulcerations, or postnasal drip.  NECK:  Supple w/ fair ROM. No JVD present. Normal carotid impulses w/o bruits. Thyroid symmetrical with no goiter or nodules palpated. No lymphadenopathy.   CV: RRR, no m/r/g, dependent peripheral edema. Pulses intact, +2 bilaterally. No cyanosis, pallor or clubbing. PULMONARY:  Unlabored, regular breathing. Diminished bilaterally A&P w/o wheezes/rales/rhonchi. No accessory muscle use.  GI: BS present and normoactive. Soft, non-tender to palpation. No organomegaly or masses detected.  MSK: No erythema, warmth or tenderness. Cap refil <2 sec all extrem. No deformities or joint swelling noted.  Neuro: A/Ox3. No focal deficits noted.   Skin: Warm, no lesions or rashe Psych: Normal affect and behavior. Judgement and thought content appropriate.     Lab Results:  CBC    Component Value Date/Time   WBC 5.5 06/10/2022 1523   RBC 4.48 06/10/2022 1523   HGB 12.6 06/10/2022 1523   HCT 40.8 06/10/2022 1523   PLT 244 06/10/2022 1523   MCV 91.1 06/10/2022 1523   MCH 28.1 06/10/2022 1523   MCHC 30.9 06/10/2022 1523   RDW 15.9 (H) 06/10/2022 1523   LYMPHSABS 1.1 09/19/2021 1644   MONOABS 0.5 09/19/2021 1644   EOSABS 0.0 09/19/2021 1644   BASOSABS 0.0 09/19/2021 1644    BMET    Component Value Date/Time   NA 133 (L) 06/10/2022 1523   K 4.1 06/10/2022 1523   CL 96 (L) 06/10/2022 1523   CO2 31 06/10/2022 1523   GLUCOSE 86 06/10/2022 1523   BUN 12 06/10/2022 1523   CREATININE 0.58 06/10/2022  1523   CREATININE 0.58  08/25/2012 1015   CALCIUM 8.7 (L) 06/10/2022 1523   GFRNONAA >60 06/10/2022 1523   GFRAA >60 12/14/2019 1300    BNP    Component Value Date/Time   BNP 57.0 06/10/2022 1523     Imaging:  DG Chest 2 View  Result Date: 07/03/2022 CLINICAL DATA:  Shortness of breath, hypoxia EXAM: CHEST - 2 VIEW COMPARISON:  Previous studies including the examination of 06/10/2022 FINDINGS: Transverse diameter of heart is increased. There are no signs of pulmonary edema or new focal infiltrates. There is no significant pleural effusion or pneumothorax. There is surgical fusion in lumbar spine. IMPRESSION: Cardiomegaly. There are no signs of pulmonary edema or focal pulmonary consolidation. Electronically Signed   By: Elmer Picker M.D.   On: 07/03/2022 16:18   DG Epidural/Nerve Root  Result Date: 06/29/2022 CLINICAL DATA:  Good response to multiple previous right L3 nerve root injections. Recurrence of symptoms. EXAM: Right L3 nerve root block and transforaminal epidural COMPARISON:  None Available. TECHNIQUE: The overlying skin was prepped with Betadine and draped in sterile fashion. Skin anesthesia was carried out using 1% Lidocaine. A curved 22 gauge spinal needle was directed into the superior ventral neural foramen on the right at L3-4. Injection of a few drops of Isovue 200 demonstrates spread outlining the right L3 nerve root with good central epidural spread. No intravascular extension. 80 mg Depo-Medrol and 2cc 1% Lidocaine were subsequently administered. No apparent complication. The patient tolerated the procedure without difficulty and was transferred to recovery in excellent condition. FLUOROSCOPY: 0 minutes 56 seconds.  44.64 micro gray meter squared IMPRESSION: Successful right L3 nerve root block and transforaminal epidural steroid injection. Electronically Signed   By: Nelson Chimes M.D.   On: 06/29/2022 09:50   DG Chest 2 View  Result Date: 06/10/2022 CLINICAL DATA:  77 year old female with  dyspnea on exertion EXAM: CHEST - 2 VIEW COMPARISON:  09/19/2021 FINDINGS: Cardiomediastinal silhouette unchanged in size and contour with cardiomegaly. Trace interlobular septal thickening, similar to the prior. Improved aeration of the lungs compared to the prior with interval resolution of pleural effusions. Stigmata of emphysema, with increased retrosternal airspace, flattened hemidiaphragms, increased AP diameter, and hyperinflation on the AP view. No pneumothorax.  No airspace disease. No acute displaced fracture. Degenerative changes of the spine. Surgical changes of the lumbar region incompletely imaged IMPRESSION: Overall improved aeration compared to the prior with interval resolution of pleural effusions. Mild interlobular septal thickening may reflect early pulmonary edema versus chronic changes of prior episodes of CHF. Cardiomegaly Electronically Signed   By: Corrie Mckusick D.O.   On: 06/10/2022 16:03         Latest Ref Rng & Units 05/12/2018   11:20 AM  PFT Results  FVC-Pre L 1.53   FVC-Predicted Pre % 61   FVC-Post L 2.02   FVC-Predicted Post % 80   Pre FEV1/FVC % % 73   Post FEV1/FCV % % 72   FEV1-Pre L 1.12   FEV1-Predicted Pre % 57   FEV1-Post L 1.45   DLCO uncorrected ml/min/mmHg 10.67   DLCO UNC% % 51   DLVA Predicted % 81   TLC L 5.01   TLC % Predicted % 93   RV % Predicted % 136     No results found for: "NITRICOXIDE"      Assessment & Plan:   COPD exacerbation ([place]) AECOPD, likely with component of dyspnea related to volume overload. We will treat her with prednisone  burst and start her on maintenance regimen with Breztri. Provided her with samples and new rx sent. Teachback performed. Side effect profile reviewed. Her cough seems to be at baseline and no evidence of acute infection on imaging so will  hold off on antimicrobial therapy. Non-toxic appearing. Action plan in place. Close follow up.   Patient Instructions  -Continue Albuterol inhaler 2 puffs or 3  mL neb every 6 hours as needed for shortness of breath or wheezing. Notify if symptoms persist despite rescue inhaler/neb use.  -Stop Combivent -Continue supplemental oxygen 2-4 lpm for goal >88-90%. Use your continuous oxygen until your breathing improves then you can try using your POC again when you are out of the house -Take your lasix daily in AM for the next 5 days then return to Monday/Wednesday/Friday dosing. Make sure you're not skipping doses. Monitor weights at home. If you gain more than 2-3 pounds overnight or 5 pounds in a week, let your heart doctor know  -Prednisone 40 mg daily for 5 days. Take in AM with food -Guaifenesin (mucinex) over the counter 600 mg Twice daily as needed for cough/congestion --Start Breztri 2 puffs Twice daily. Brush tongue and rinse mouth afterwards. Let me know if this is expensive and I can send an alternative regimen  Chest x ray today did not show any evidence of infection and there did not appear to be a significant amount of fluid on your lungs, which is good news.   Follow up in 2 weeks with Dr. Verlee Monte or Joellen Jersey Cabot Cromartie,NP. If symptoms do not improve or worsen, please contact office for sooner follow up or seek emergency care.    CHF (congestive heart failure) ([facility]) Concurrent CHF exacerbation with volume overload and weight gain of 5 lb from one week ago. No significant pulmonary edema on imaging today. Discussed the importance of compliance with her diuretic regimen. We will have her take her lasix for 5 days then return to M/W/F dosing. She agrees to be more consistent with this. Plan to recheck BMET upon return. Monitor weights at home for 2-3 lb overnight or 5 lb weight gain in a week. Follow up with cardiology as scheduled.   Pulmonary hypertension ([place]) Failed numerous therapies in the past. See above.   Acute on chronic respiratory failure ([place]) She has had low O2 levels at home, which seem to have improved. She was able to maintain on 4 lpm POC  in office today, which is increased from her baseline; however, her sats were onlly 90% on this. Advised her to use her continuous oxygen until her breathing feels back to baseline. Low suspicion for PE based on Wells Criteria. She can retrial POC once she is better if she can maintain levels >88-90%, which is her goal. Strict ED/return precautions advised.    I spent 42 minutes of dedicated to the care of this patient on the date of this encounter to include pre-visit review of records, face-to-face time with the patient discussing conditions above, post visit ordering of testing, clinical documentation with the electronic health record, making appropriate referrals as documented, and communicating necessary findings to members of the patients care team.  Clayton Bibles, NP 07/03/2022  Pt aware and understands NP's role.

## 2022-07-03 NOTE — Assessment & Plan Note (Signed)
Failed numerous therapies in the past. See above.

## 2022-07-03 NOTE — Assessment & Plan Note (Signed)
Concurrent CHF exacerbation with volume overload and weight gain of 5 lb from one week ago. No significant pulmonary edema on imaging today. Discussed the importance of compliance with her diuretic regimen. We will have her take her lasix for 5 days then return to M/W/F dosing. She agrees to be more consistent with this. Plan to recheck BMET upon return. Monitor weights at home for 2-3 lb overnight or 5 lb weight gain in a week. Follow up with cardiology as scheduled.

## 2022-07-03 NOTE — Assessment & Plan Note (Signed)
AECOPD, likely with component of dyspnea related to volume overload. We will treat her with prednisone burst and start her on maintenance regimen with Breztri. Provided her with samples and new rx sent. Teachback performed. Side effect profile reviewed. Her cough seems to be at baseline and no evidence of acute infection on imaging so will  hold off on antimicrobial therapy. Non-toxic appearing. Action plan in place. Close follow up.   Patient Instructions  -Continue Albuterol inhaler 2 puffs or 3 mL neb every 6 hours as needed for shortness of breath or wheezing. Notify if symptoms persist despite rescue inhaler/neb use.  -Stop Combivent -Continue supplemental oxygen 2-4 lpm for goal >88-90%. Use your continuous oxygen until your breathing improves then you can try using your POC again when you are out of the house -Take your lasix daily in AM for the next 5 days then return to Monday/Wednesday/Friday dosing. Make sure you're not skipping doses. Monitor weights at home. If you gain more than 2-3 pounds overnight or 5 pounds in a week, let your heart doctor know  -Prednisone 40 mg daily for 5 days. Take in AM with food -Guaifenesin (mucinex) over the counter 600 mg Twice daily as needed for cough/congestion --Start Breztri 2 puffs Twice daily. Brush tongue and rinse mouth afterwards. Let me know if this is expensive and I can send an alternative regimen  Chest x ray today did not show any evidence of infection and there did not appear to be a significant amount of fluid on your lungs, which is good news.   Follow up in 2 weeks with Dr. Verlee Monte or Joellen Jersey Onita Pfluger,NP. If symptoms do not improve or worsen, please contact office for sooner follow up or seek emergency care.

## 2022-07-03 NOTE — Patient Instructions (Addendum)
-  Continue Albuterol inhaler 2 puffs or 3 mL neb every 6 hours as needed for shortness of breath or wheezing. Notify if symptoms persist despite rescue inhaler/neb use.  -Stop Combivent -Continue supplemental oxygen 2-4 lpm for goal >88-90%. Use your continuous oxygen until your breathing improves then you can try using your POC again when you are out of the house -Take your lasix daily in AM for the next 5 days then return to Monday/Wednesday/Friday dosing. Make sure you're not skipping doses. Monitor weights at home. If you gain more than 2-3 pounds overnight or 5 pounds in a week, let your heart doctor know  -Prednisone 40 mg daily for 5 days. Take in AM with food -Guaifenesin (mucinex) over the counter 600 mg Twice daily as needed for cough/congestion --Start Breztri 2 puffs Twice daily. Brush tongue and rinse mouth afterwards. Let me know if this is expensive and I can send an alternative regimen  Chest x ray today did not show any evidence of infection and there did not appear to be a significant amount of fluid on your lungs, which is good news.   Follow up in 2 weeks with Dr. Verlee Monte or Joellen Jersey Helix Lafontaine,NP. If symptoms do not improve or worsen, please contact office for sooner follow up or seek emergency care.

## 2022-07-03 NOTE — Assessment & Plan Note (Addendum)
She has had low O2 levels at home, which seem to have improved. She was able to maintain on 4 lpm POC in office today, which is increased from her baseline; however, her sats were onlly 90% on this. Advised her to use her continuous oxygen until her breathing feels back to baseline. Low suspicion for PE based on Wells Criteria. She can retrial POC once she is better if she can maintain levels >88-90%, which is her goal. Strict ED/return precautions advised.

## 2022-07-06 ENCOUNTER — Ambulatory Visit (HOSPITAL_COMMUNITY): Payer: Medicare Other

## 2022-07-08 ENCOUNTER — Ambulatory Visit (HOSPITAL_COMMUNITY)
Admission: RE | Admit: 2022-07-08 | Discharge: 2022-07-08 | Disposition: A | Discharge status: Home / Self Care | Payer: Medicare Other | Source: Ambulatory Visit | Attending: Family Medicine | Admitting: Family Medicine

## 2022-07-08 DIAGNOSIS — Z1231 Encounter for screening mammogram for malignant neoplasm of breast: Secondary | ICD-10-CM | POA: Diagnosis present

## 2022-07-15 ENCOUNTER — Encounter: Payer: Self-pay | Admitting: Gastroenterology

## 2022-07-15 ENCOUNTER — Ambulatory Visit: Admitting: Gastroenterology

## 2022-07-15 VITALS — BP 122/64 | HR 97 | Temp 98.0°F | Ht 66.0 in | Wt 150.2 lb

## 2022-07-15 DIAGNOSIS — K219 Gastro-esophageal reflux disease without esophagitis: Secondary | ICD-10-CM | POA: Diagnosis not present

## 2022-07-15 NOTE — Patient Instructions (Signed)
Continue to monitor for heartburn, problems swallowing, nausea, abdominal pain. Call if you have recurrent symptoms.  Continue pantoprazole once daily. Return office visit as needed.

## 2022-07-15 NOTE — Progress Notes (Signed)
GI Office Note    Referring Provider: [name], MD Primary Care Physician:  [name], MD  Primary Gastroenterologist: Roetta Sessions, MD   Chief Complaint   Chief Complaint  Patient presents with   Follow-up    Doing well    History of Present Illness   [name] is a 77 y.o. female presenting today for follow-up.  Last seen in December 2023.  At last visit she was feeling like food sticking her her throat. Felt bloated. Under a lot of stress at that time caring for her husband. She completed esophagram as outlined below. Discussed findings of esophagram along with last EGD with Dr. Jena Gauss and it was not felt that she needed repeat EGD.   Patient states that her husband passed away since we last saw her. She was married for 55 years. She reports that she is with palliative care now for the past 1-2 months. She feels well from a GI standpoint. She denied dysphagia, heartburn, n/v, abdominal pain. BMs regular. She takes Pantoprazole once daily since 03/2022 and feels like it has been helpful.  Barium pill esophagram December 2023 IMPRESSION: -No esophageal abnormalities. -Postsurgical changes of prior partial gastrectomy and Billroth 1 anastomosis. -Wall thickening at the duodenal bulb which could reflect acute edema from ulceration or scarring from old ulcer disease. -Remainder of exam unremarkable  EGD 12/2020: -Normal esophagus. Dilated. -Prior hemigastrectomy with Billroth I configuration. Anastomotic nodule biopsied with benign findings  Medications   Current Outpatient Medications  Medication Sig Dispense Refill   albuterol (2.5 MG/3ML) 0.083% NEBU 3 mL, albuterol (5 MG/ML) 0.5% NEBU 0.5 mL Inhale into the lungs.     amLODipine (NORVASC) 5 MG tablet Take 1 tablet (5 mg total) by mouth daily. 90 tablet 3   aspirin EC 81 MG EC tablet Take 1 tablet (81 mg total) by mouth daily. Swallow whole. 30 tablet 11   atorvastatin (LIPITOR) 40 MG  tablet Take 1 tablet (40 mg total) by mouth daily. 30 tablet 11   Budeson-Glycopyrrol-Formoterol (BREZTRI AEROSPHERE) 160-9-4.8 MCG/ACT AERO Inhale 2 puffs into the lungs in the morning and at bedtime. 10.7 g 11   clonazePAM (KLONOPIN) 1 MG tablet Take 0.5-1 mg by mouth 4 (four) times daily as needed for anxiety.     escitalopram (LEXAPRO) 10 MG tablet Take 10 mg by mouth at bedtime.     ferrous sulfate 325 (65 FE) MG tablet Take 325 mg by mouth 3 (three) times a week.     furosemide (LASIX) 40 MG tablet Take 1 tablet (40 mg total) by mouth every Monday, Wednesday, and Friday. May take for weight gain of 3 lbs over night or 5 lbs in a week 30 tablet 3   gabapentin (NEURONTIN) 300 MG capsule Take 300 mg by mouth 3 (three) times daily.     HYDROcodone-acetaminophen (NORCO) 10-325 MG tablet Take 1 tablet by mouth every 4 (four) hours as needed (severe back pain.).     nitroGLYCERIN (NITROSTAT) 0.4 MG SL tablet Place 1 tablet (0.4 mg total) under the tongue every 5 (five) minutes as needed for chest pain. 25 tablet 3   OXYGEN Inhale 2 L into the lungs.     pantoprazole (PROTONIX) 40 MG tablet Take 40 mg by mouth at bedtime.     potassium chloride (KLOR-CON) 10 MEQ tablet Take 1 tablet (10 mEq total) by mouth every Monday, Wednesday, and Friday. Take 1 tab when you take the furosemide/Lasix tablet 30 tablet 2  No current facility-administered medications for this visit.    Allergies   Allergies as of 07/15/2022 - Review Complete 07/15/2022  Allergen Reaction Noted   Chantix [varenicline] Nausea And Vomiting 12/01/2016   Codeine Other (See Comments) 12/01/2016   Opsumit [macitentan]  06/10/2022   Tadalafil  06/10/2022   Tyvaso [treprostinil]  06/10/2022   Oxycodone Palpitations 01/11/2017       Review of Systems   General: Negative for anorexia, weight loss, fever, chills, fatigue, weakness. ENT: Negative for hoarseness, difficulty swallowing , nasal congestion. CV: Negative for chest  pain, angina, palpitations, dyspnea on exertion, peripheral edema.  Respiratory: Negative for dyspnea at rest, dyspnea on exertion, cough, sputum, wheezing.  GI: See history of present illness. GU:  Negative for dysuria, hematuria, urinary incontinence, urinary frequency, nocturnal urination.  Endo: Negative for unusual weight change.     Physical Exam   BP 122/64 (BP Location: Right Arm, Patient Position: Sitting, Cuff Size: Normal)   Pulse 97   Temp 98 F (36.7 C) (Oral)   Ht 5\' 6"  (1.676 m)   Wt 150 lb 3.2 oz (68.1 kg)   BMI 24.24 kg/m    General: Well-nourished, well-developed in no acute distress.  Eyes: No icterus. Mouth: Oropharyngeal mucosa moist and pink  Extremities: No lower extremity edema. No clubbing or deformities. Neuro: Alert and oriented x 4   Skin: Warm and dry, no jaundice.   Psych: Alert and cooperative, normal mood and affect.  Labs   Lab Results  Component Value Date   CREATININE 0.58 06/10/2022   BUN 12 06/10/2022   NA 133 (L) 06/10/2022   K 4.1 06/10/2022   CL 96 (L) 06/10/2022   CO2 31 06/10/2022   Lab Results  Component Value Date   WBC 5.5 06/10/2022   HGB 12.6 06/10/2022   HCT 40.8 06/10/2022   MCV 91.1 06/10/2022   PLT 244 06/10/2022   Lab Results  Component Value Date   WBC 5.5 06/10/2022   HGB 12.6 06/10/2022   HCT 40.8 06/10/2022   MCV 91.1 06/10/2022   PLT 244 06/10/2022   Lab Results  Component Value Date   HGBA1C 5.4 06/07/2021     Imaging Studies   MM 3D SCREENING MAMMOGRAM BILATERAL BREAST  Result Date: 07/09/2022 CLINICAL DATA:  Screening. EXAM: DIGITAL SCREENING BILATERAL MAMMOGRAM WITH TOMOSYNTHESIS AND CAD TECHNIQUE: Bilateral screening digital craniocaudal and mediolateral oblique mammograms were obtained. Bilateral screening digital breast tomosynthesis was performed. The images were evaluated with computer-aided detection. COMPARISON:  Previous exam(s). ACR Breast Density Category c: The breasts are  heterogeneously dense, which may obscure small masses. FINDINGS: There are no findings suspicious for malignancy. IMPRESSION: No mammographic evidence of malignancy. A result letter of this screening mammogram will be mailed directly to the patient. RECOMMENDATION: Screening mammogram in one year. (Code:SM-B-01Y) BI-RADS CATEGORY  1: Negative. Electronically Signed   By: Amie Portland M.D.   On: 07/09/2022 10:00   DG Chest 2 View  Result Date: 07/03/2022 CLINICAL DATA:  Shortness of breath, hypoxia EXAM: CHEST - 2 VIEW COMPARISON:  Previous studies including the examination of 06/10/2022 FINDINGS: Transverse diameter of heart is increased. There are no signs of pulmonary edema or new focal infiltrates. There is no significant pleural effusion or pneumothorax. There is surgical fusion in lumbar spine. IMPRESSION: Cardiomegaly. There are no signs of pulmonary edema or focal pulmonary consolidation. Electronically Signed   By: Ernie Avena M.D.   On: 07/03/2022 16:18   DG Epidural/Nerve Root  Result  Date: 06/29/2022 CLINICAL DATA:  Good response to multiple previous right L3 nerve root injections. Recurrence of symptoms. EXAM: Right L3 nerve root block and transforaminal epidural COMPARISON:  None Available. TECHNIQUE: The overlying skin was prepped with Betadine and draped in sterile fashion. Skin anesthesia was carried out using 1% Lidocaine. A curved 22 gauge spinal needle was directed into the superior ventral neural foramen on the right at L3-4. Injection of a few drops of Isovue 200 demonstrates spread outlining the right L3 nerve root with good central epidural spread. No intravascular extension. 80 mg Depo-Medrol and 2cc 1% Lidocaine were subsequently administered. No apparent complication. The patient tolerated the procedure without difficulty and was transferred to recovery in excellent condition. FLUOROSCOPY: 0 minutes 56 seconds.  44.64 micro gray meter squared IMPRESSION: Successful right L3  nerve root block and transforaminal epidural steroid injection. Electronically Signed   By: Paulina FusiMark  Shogry M.D.   On: 06/29/2022 09:50    Assessment   Dysphagia: symptoms resolved.   GERD: well controlled on pantoprazole.   PLAN   Continue pantoprazole daily.  Monitor for dysphagia, nausea, abdominal pain. Call if recurrent symptoms. Return ov as needed.    Leanna BattlesLeslie S. Melvyn NethLewis, MHS, PA-C [facility]

## 2022-07-16 ENCOUNTER — Ambulatory Visit: Admitting: Internal Medicine

## 2022-07-16 ENCOUNTER — Encounter: Payer: Self-pay | Admitting: Internal Medicine

## 2022-07-16 VITALS — BP 117/53 | HR 73 | Ht 66.0 in | Wt 148.6 lb

## 2022-07-16 DIAGNOSIS — F17210 Nicotine dependence, cigarettes, uncomplicated: Secondary | ICD-10-CM

## 2022-07-16 DIAGNOSIS — J9612 Chronic respiratory failure with hypercapnia: Secondary | ICD-10-CM

## 2022-07-16 DIAGNOSIS — J4489 Other specified chronic obstructive pulmonary disease: Secondary | ICD-10-CM | POA: Diagnosis not present

## 2022-07-16 DIAGNOSIS — J9611 Chronic respiratory failure with hypoxia: Secondary | ICD-10-CM

## 2022-07-16 MED ORDER — PANTOPRAZOLE SODIUM 40 MG PO TBEC: DELAYED_RELEASE_TABLET | ORAL | Status: AC

## 2022-07-16 MED ORDER — ALBUTEROL SULFATE (2.5 MG/3ML) 0.083% IN NEBU: 2.5000 mg | INHALATION_SOLUTION | RESPIRATORY_TRACT | Status: DC | PRN

## 2022-07-16 MED ORDER — PREDNISONE 10 MG PO TABS: ORAL_TABLET | ORAL | 0 refills | Status: DC

## 2022-07-16 MED ORDER — ALBUTEROL SULFATE HFA 108 (90 BASE) MCG/ACT IN AERS: 2.0000 | INHALATION_SPRAY | RESPIRATORY_TRACT | Status: AC | PRN

## 2022-07-16 MED ORDER — FAMOTIDINE 20 MG PO TABS: ORAL_TABLET | ORAL | 11 refills | Status: AC

## 2022-07-16 NOTE — Assessment & Plan Note (Signed)
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    F.u in 6 weeks         Each maintenance medication was reviewed in detail including emphasizing most importantly the difference between maintenance and prns and under what circumstances the prns are to be triggered using an action plan format where appropriate.  Total time for H and P, chart review, counseling, reviewing hfa/neb/02  device(s) and generating customized AVS unique to this office visit / same day charting> 40 min with pt new to m

## 2022-07-16 NOTE — Progress Notes (Signed)
[name], female    DOB: 1945/07/24    MRN: [id number]   Brief patient profile:  11  yobf  active smoker  referred to pulmonary clinic in [place]  07/16/2022 by [name]  for copd / 02 dep onset of symptoms  1990s    Started on 02 x since around 2020 per pt  Inhaler dep since 2023        History of Present Illness  07/16/2022  Pulmonary/ 1st office eval/ Sanna Porcaro / [facility] maint breztri but suboptimal technique  Chief Complaint  Patient presents with   Follow-up    Pt f/u son states that pt finished a course of prednisone on 4/3 & symptoms (SOB, coughing, wheezing) returned in 4/5. She is currently using 3.5L continuous O2 @ home & 4L on POC  Dyspnea:  grocery shopping /walking to car in driveway a challeng   Cough: smoker's rattle  Sleep: bed is flat / 2 pillows  SABA use: none 02: 2 lpm / prn daytime  > lately up to 3.5 but not really titrating effectively  Breathing much better just while on pred and worse p a few days off   Lung cancer screen: had CTa 06/07/21 neg nodules    No obvious day to day or daytime pattern/variability or assoc excess/ purulent sputum or mucus plugs or hemoptysis or cp or chest tightness, subjective wheeze or overt sinus or hb symptoms.   Sleeping ok as above without nocturnal  or early am exacerbation  of respiratory  c/o's or need for noct saba. Also denies any obvious fluctuation of symptoms with weather or environmental changes or other aggravating or alleviating factors except as outlined above   No unusual exposure hx or h/o childhood pna/ asthma or knowledge of premature birth.  Current Allergies, Complete Past Medical History, Past Surgical History, Family History, and Social History were reviewed in [employer] record.  ROS  The following are not active complaints unless bolded Hoarseness, sore throat, dysphagia, dental problems, itching, sneezing,  nasal congestion or discharge of excess  mucus or purulent secretions, ear ache,   fever, chills, sweats, unintended wt loss or wt gain, classically pleuritic or exertional cp,  orthopnea pnd or arm/hand swelling  or leg swelling, presyncope, palpitations, abdominal pain, anorexia, nausea, vomiting, diarrhea  or change in bowel habits or change in bladder habits, change in stools or change in urine, dysuria, hematuria,  rash, arthralgias, visual complaints, headache, numbness, weakness or ataxia or problems with walking or coordination,  change in mood or  memory.           Past Medical History:  Diagnosis Date   Arthritis    Chronic back pain    Chronic heart failure with preserved ejection fraction (HFpEF)    a. 06/2021 Echo: EF 60-65%, no rwma, GrI DD, mod-sev reduced RV fxn.   COPD (chronic obstructive pulmonary disease)    patient is on oxygen at night when needed   Depression    GERD (gastroesophageal reflux disease)    Hypertension    Neuropathy    Nonobstructive CAD (coronary artery disease)    a. 06/2021 NSTEMI/Cath: LM nl, LAD 25d, LCX nl, RCA 30p.   PAH (pulmonary artery hypertension)    a. 04/2018 PA 55/21 (36); b. 06/2021 Echo: EF 60-65%, no rwma, GrI DD, mod-sev reduced RV fxn, mod BAE, triv MR, mod TR; c. 06/2021 Cath: PA mean 35, PVR 7 WU.   Thyroid disease    Tobacco  abuse    Ulcer    Billroth I    Outpatient Medications Prior to Visit  Medication Sig Dispense Refill   albuterol (2.5 MG/3ML) 0.083% NEBU 3 mL, albuterol (5 MG/ML) 0.5% NEBU 0.5 mL Inhale into the lungs.     amLODipine (NORVASC) 5 MG tablet Take 1 tablet (5 mg total) by mouth daily. 90 tablet 3   aspirin EC 81 MG EC tablet Take 1 tablet (81 mg total) by mouth daily. Swallow whole. 30 tablet 11   atorvastatin (LIPITOR) 40 MG tablet Take 1 tablet (40 mg total) by mouth daily. 30 tablet 11   Budeson-Glycopyrrol-Formoterol (BREZTRI AEROSPHERE) 160-9-4.8 MCG/ACT AERO Inhale 2 puffs into the lungs in the morning and at bedtime. 10.7 g 11   clonazePAM  (KLONOPIN) 1 MG tablet Take 0.5-1 mg by mouth 4 (four) times daily as needed for anxiety.     escitalopram (LEXAPRO) 10 MG tablet Take 10 mg by mouth at bedtime.     ferrous sulfate 325 (65 FE) MG tablet Take 325 mg by mouth 3 (three) times a week.     furosemide (LASIX) 40 MG tablet Take 1 tablet (40 mg total) by mouth every Monday, Wednesday, and Friday. May take for weight gain of 3 lbs over night or 5 lbs in a week 30 tablet 3   gabapentin (NEURONTIN) 300 MG capsule Take 300 mg by mouth 3 (three) times daily.     HYDROcodone-acetaminophen (NORCO) 10-325 MG tablet Take 1 tablet by mouth every 4 (four) hours as needed (severe back pain.).     nitroGLYCERIN (NITROSTAT) 0.4 MG SL tablet Place 1 tablet (0.4 mg total) under the tongue every 5 (five) minutes as needed for chest pain. 25 tablet 3   OXYGEN Inhale 2 L into the lungs.     pantoprazole (PROTONIX) 40 MG tablet Take 40 mg by mouth at bedtime.     potassium chloride (KLOR-CON) 10 MEQ tablet Take 1 tablet (10 mEq total) by mouth every Monday, Wednesday, and Friday. Take 1 tab when you take the furosemide/Lasix tablet 30 tablet 2   No facility-administered medications prior to visit.     Objective:     BP (!) 117/53   Pulse 73   Ht 5\' 6"  (1.676 m)   Wt 148 lb 9.6 oz (67.4 kg)   SpO2 (!) 88% Comment: 4L POC @ rest  BMI 23.98 kg/m   SpO2: (!) 88 % (4L POC @ rest)  HEENT : Oropharynx  clear      NECK :  without  apparent JVD/ palpable Nodes/TM    LUNGS: no acc muscle use,  Mild barrel  contour chest wall with bilateral pan exp  wheeze and  without cough on insp or exp maneuvers  and mild  Hyperresonant  to  percussion bilaterally     CV:  RRR  no s3 or murmur or increase in P2, and no edema   ABD:  soft and nontender with pos end  insp Hoover's  in the supine position.  No bruits or organomegaly appreciated   MS:  Nl gait/ ext warm without deformities Or obvious joint restrictions  calf tenderness, cyanosis or clubbing      SKIN: warm and dry without lesions    NEURO:  alert, approp, nl sensorium with  no motor or cerebellar deficits apparent.        I personally reviewed images and agree with radiology impression as follows:  CXR:   07/03/22  Cardiomegaly. There are no  signs of pulmonary edema or focal pulmonary consolidation.   Labs ordered/ reviewed:      Chemistry      Component Value Date/Time   NA 133 (L) 06/10/2022 1523   K 4.1 06/10/2022 1523   CL 96 (L) 06/10/2022 1523   CO2 31 06/10/2022 1523   BUN 12 06/10/2022 1523   CREATININE 0.58 06/10/2022 1523   CREATININE 0.58 08/25/2012 1015      Component Value Date/Time   CALCIUM 8.7 (L) 06/10/2022 1523   ALKPHOS 71 06/05/2021 1525   AST 17 06/05/2021 1525   ALT 10 06/05/2021 1525   BILITOT 0.8 06/05/2021 1525        Lab Results  Component Value Date   WBC 5.5 06/10/2022   HGB 12.6 06/10/2022   HCT 40.8 06/10/2022   MCV 91.1 06/10/2022   PLT 244 06/10/2022     No results found for: "DDIMER"    Lab Results  Component Value Date   TSH 0.556 04/07/2018     Lab Results  Component Value Date   PROBNP <30.0 01/14/2007       Lab Results  Component Value Date   ESRSEDRATE 6 03/09/2011          Assessment   Chronic asthmatic bronchitis Active smoker - PFT's  05/12/18  FEV1 1.45 (74 % ) ratio 0.72  p 29 % improvement from saba p ? 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    F.u in 6 weeks         Each maintenance medication was reviewed in detail including emphasizing most importantly the difference between maintenance and prns and under what circumstances the prns are to be triggered using an action plan format where appropriate.  Total time for H and P, chart review, counseling, reviewing hfa/neb/02  device(s) and generating customized AVS unique to this office visit / same day charting> 40 min with pt new to m        Cigarette smoker 4-5 min discussion re active cigarette smoking in addition to office E&M  Ask about tobacco use:   ongoing Advise quitting   matter of life or breath and high risk injury with 02 in use  Assess willingness:  Not committed at this point Assist in quit attempt:  Per PCP when ready Arrange follow up:   Follow up  per Primary Care planned           Sandrea HughsMichael Kyrstin Campillo, MD 07/16/2022

## 2022-07-16 NOTE — Assessment & Plan Note (Signed)
Active smoker - PFT's  05/12/18  FEV1 1.45 (74 % ) ratio 0.72  p 29 % improvement from saba p ? prior to study with DLCO  10.67 (51%)   and FV curve mildly concave  - 07/16/2022  After extensive coaching inhaler device,  effectiveness =    60% from a baseline of 30% > continue breztry with pred as backup = 20 mg until better, 10 mg x 5 days and try off    Group D (now reclassified as E) in terms of symptom/risk and laba/lama/ICS  therefore appropriate rx at this point >>>  breztri 2bid / pred prn for now and work hard on technique

## 2022-07-16 NOTE — Assessment & Plan Note (Signed)
4-5 min discussion re active cigarette smoking in addition to office E&M  Ask about tobacco use:   ongoing Advise quitting   matter of life or breath and high risk injury with 02 in use  Assess willingness:  Not committed at this point Assist in quit attempt:  Per PCP when ready Arrange follow up:   Follow up per Primary Care planned

## 2022-07-16 NOTE — Patient Instructions (Addendum)
Plan A = Automatic = Always=    Breztri Take 2 puffs first thing in am and then another 2 puffs about 12 hours later.    Work on inhaler technique:  relax and gently blow all the way out then take a nice smooth full deep breath back in, triggering the inhaler at same time you start breathing in.  Hold breath in for at least  5 seconds if you can. Blow out breztri thru nose. Rinse and gargle with water when done.  If mouth or throat bother you at all,  try brushing teeth/gums/tongue with arm and hammer toothpaste/ make a slurry and gargle and spit out.   Prednisone 10 mg 2 each am until better then 1 daily x 5 days and stop   Plan B = Backup (to supplement plan A, not to replace it) Only use your albuterol inhaler as a rescue medication to be used if you can't catch your breath by resting or doing a relaxed purse lip breathing pattern.  - The less you use it, the better it will work when you need it. - Ok to use the inhaler up to 2 puffs  every 4 hours if you must but call for appointment if use goes up over your usual need - Don't leave home without it !!  (think of it like the spare tire for your car)   Plan C = Crisis (instead of Plan B but only if Plan B stops working) - only use your albuterol nebulizer if you first try Plan B and it fails to help > ok to use the nebulizer up to every 4 hours but if start needing it regularly call for immediate appointment  Pantoprazole (protonix) 40 mg   Take  30-60 min before first meal of the day and Pepcid (famotidine)  20 mg after supper until return to office - this is the best way to tell whether stomach acid is contributing to your problem.     Please schedule a follow up office visit in 6 weeks, call sooner if needed - bring inhalers

## 2022-07-17 ENCOUNTER — Ambulatory Visit: Payer: Medicare Other | Admitting: Nurse Practitioner

## 2022-08-04 ENCOUNTER — Other Ambulatory Visit: Payer: Self-pay | Admitting: Internal Medicine

## 2022-08-12 NOTE — Progress Notes (Unsigned)
Cardiology Office Note    Date:  08/13/2022  ID:  [name], DOB 12/12/1945, MRN [id number] Cardiologist: Dietrich Pates, MD    History of Present Illness:    [name] is a 77 y.o. female with past medical history of pulmonary hypertension/RV failure, CAD (s/p cath in 06/2021 showing mild nonobstructive disease), HTN, HLD, COPD and continued tobacco use who presents to the office today for 6-week follow-up.  She was examined by Sharlene Dory, NP in 06/2022 following recent dose adjustment of Lasix given volume overload by examination. She reported overall feeling back to her baseline, although her weight was listed as having increased by 3 pounds. She was continued on Lasix 40 mg MWF and no additional changes were made to her medications at that time.   In talking with the patient and her family friend today, she reports her fluid has been more stable since her last office visit. She has baseline dyspnea on exertion but denies any acute changes in this. She uses supplemental oxygen at night and as needed during the day. No specific orthopnea or PND. Reports her lower extremity edema has significantly improved and she is using compression stockings on a daily basis. No recent exertional chest pain or palpitations. She is followed by Hospice and her nurse visits at least once a week. She has been experiencing recent episodes of epistaxis and says her son has already made her an appointment with ENT.  Studies Reviewed:   EKG: EKG is ordered today and demonstrates normal sinus rhythm, heart rate 81 with right axis deviation. No acute ST abnormalities.  Echocardiogram: 06/2021 IMPRESSIONS     1. Left ventricular ejection fraction, by estimation, is 60 to 65%. The  left ventricle has normal function. The left ventricle has no regional  wall motion abnormalities. There is mild left ventricular hypertrophy.  Left ventricular diastolic parameters  are consistent with Grade I  diastolic dysfunction (impaired relaxation).  Elevated left atrial pressure.   2. Ventricular septum is flattened in systole suggesting RV pressure  overload. . Right ventricular systolic function moderately to severely  reduced. The right ventricular size is moderately to severely dilated.  There is moderately elevated pulmonary  artery systolic pressure.   3. Left atrial size was moderately dilated.   4. Right atrial size was moderately dilated.   5. The mitral valve is normal in structure. Trivial mitral valve  regurgitation. No evidence of mitral stenosis.   6. The tricuspid valve is abnormal. Tricuspid valve regurgitation is  moderate.   7. The aortic valve was not well visualized. There is mild calcification  of the aortic valve. There is mild thickening of the aortic valve. Aortic  valve regurgitation is not visualized. No aortic stenosis is present.   8. The inferior vena cava is dilated in size with <50% respiratory  variability, suggesting right atrial pressure of 15 mmHg.    R/LHC: 06/2021   Prox RCA to Mid RCA lesion is 30% stenosed.   Dist LAD lesion is 25% stenosed.   LV end diastolic pressure is mildly elevated.   Hemodynamic findings consistent with moderate pulmonary hypertension.   Mild nonobstructive CAD Moderate pulmonary HTN. PAP mean 35 mm Hg. PVR 7 WU Mild elevation of LV filling pressures. PCWP mean 15 mm Hg. LVEDP 16 mm Hg Normal cardiac output. Index 2.83.    Plan: medical management. Pulmonary pressures have not changed significantly since prior right  heart cath in 2020.    Physical Exam:  VS:  BP (!) 100/56   Pulse 73   Ht 5\' 6"  (1.676 m)   Wt 148 lb (67.1 kg)   SpO2 94%   BMI 23.89 kg/m    Wt Readings from Last 3 Encounters:  08/13/22 148 lb (67.1 kg)  07/16/22 148 lb 9.6 oz (67.4 kg)  07/15/22 150 lb 3.2 oz (68.1 kg)     GEN: Pleasant, elderly female appearing in no acute distress NECK: No JVD; No carotid bruits CARDIAC: RRR, no  murmurs, rubs, gallops RESPIRATORY:  Clear to auscultation without rales, wheezing or rhonchi  ABDOMEN: Appears non-distended. No obvious abdominal masses. EXTREMITIES: No clubbing or cyanosis. No pitting edema.  Distal pedal pulses are 2+ bilaterally. Compression stockings in place.    Assessment and Plan:   1. Chronic HFpEF/Pulmonary HTN - Her respiratory status has overall been stable and weight has actually declined by 3 pounds over the past few months. She appears euvolemic by examination today.  - Previously followed by [facility] for pulmonary hypertension but unable to tolerate Opsumit or Tyvaso and had possible angioedema with Tadalafil. Will continue her current diuretic regimen with Lasix 40 mg MWF. Could consider adding an SGLT2 inhibitor if she has recurrent issues with fluid retention.   2. CAD - She had mild nonobstructive CAD by cath in 06/2021. She has baseline dyspnea on exertion but no recent chest pain. Continue ASA 81 mg daily and Atorvastatin 40 mg daily. Given her recent epistaxis, we did review that she could hold her aspirin for 5 days to see if this helps with her symptoms (already has follow-up with ENT).  3. HTN - Blood pressure is soft at 100/56 during today's visit but she denies any associated symptoms and says her blood pressure is variable at home and is sometimes elevated. At this time, will continue her current regimen with Amlodipine 5 mg daily and Lasix 40 mg MWF.   4. HLD - FLP in 06/2021 showed her LDL was 71. Continue current medical therapy with Atorvastatin 40 mg daily.  5. COPD/Tobacco Use - Followed by Pulmonology. She is still smoking routinely and says she has been unable to cut down secondary distress. Reports worsening stress since her husband passed away earlier this year.  Signed, Ellsworth Lennox, PA-C

## 2022-08-13 ENCOUNTER — Encounter: Payer: Self-pay | Admitting: Student

## 2022-08-13 ENCOUNTER — Ambulatory Visit: Payer: Medicare Other | Attending: Student | Admitting: Student

## 2022-08-13 VITALS — BP 100/56 | HR 73 | Ht 66.0 in | Wt 148.0 lb

## 2022-08-13 DIAGNOSIS — I2510 Atherosclerotic heart disease of native coronary artery without angina pectoris: Secondary | ICD-10-CM

## 2022-08-13 DIAGNOSIS — Z720 Tobacco use: Secondary | ICD-10-CM

## 2022-08-13 DIAGNOSIS — I5032 Chronic diastolic (congestive) heart failure: Secondary | ICD-10-CM

## 2022-08-13 DIAGNOSIS — E785 Hyperlipidemia, unspecified: Secondary | ICD-10-CM

## 2022-08-13 DIAGNOSIS — I272 Pulmonary hypertension, unspecified: Secondary | ICD-10-CM

## 2022-08-13 DIAGNOSIS — I10 Essential (primary) hypertension: Secondary | ICD-10-CM | POA: Diagnosis not present

## 2022-08-13 MED ORDER — AMLODIPINE BESYLATE 5 MG PO TABS: 5.0000 mg | ORAL_TABLET | Freq: Every day | ORAL | 3 refills | Status: DC

## 2022-08-13 MED ORDER — FUROSEMIDE 40 MG PO TABS: 40.0000 mg | ORAL_TABLET | ORAL | 3 refills | Status: AC

## 2022-08-13 NOTE — Patient Instructions (Signed)
Medication Instructions:  Your physician has recommended you make the following change in your medication:   Hold Aspirin for 5 days   *If you need a refill on your cardiac medications before your next appointment, please call your pharmacy*   Lab Work: NONE   If you have labs (blood work) drawn today and your tests are completely normal, you will receive your results only by: MyChart Message (if you have MyChart) OR A paper copy in the mail If you have any lab test that is abnormal or we need to change your treatment, we will call you to review the results.   Testing/Procedures: NONE    Follow-Up: At [facility], you and your health needs are our priority.  As part of our continuing mission to provide you with exceptional heart care, we have created designated Provider Care Teams.  These Care Teams include your primary Cardiologist (physician) and Advanced Practice Providers (APPs -  Physician Assistants and Nurse Practitioners) who all work together to provide you with the care you need, when you need it.  We recommend signing up for the patient portal called "MyChart".  Sign up information is provided on this After Visit Summary.  MyChart is used to connect with patients for Virtual Visits (Telemedicine).  Patients are able to view lab/test results, encounter notes, upcoming appointments, etc.  Non-urgent messages can be sent to your provider as well.   To learn more about what you can do with MyChart, go to [email or web address].    Your next appointment:   6 month(s)  Provider:   Dietrich Pates, MD    Other Instructions Thank you for choosing [facility]!

## 2022-08-26 ENCOUNTER — Encounter: Payer: Self-pay | Admitting: Obstetrics and Gynecology

## 2022-08-27 ENCOUNTER — Encounter: Payer: Self-pay | Admitting: Internal Medicine

## 2022-08-27 ENCOUNTER — Ambulatory Visit (INDEPENDENT_AMBULATORY_CARE_PROVIDER_SITE_OTHER): Payer: Medicare Other | Admitting: Internal Medicine

## 2022-08-27 VITALS — BP 120/52 | HR 108 | Ht 66.0 in | Wt 152.4 lb

## 2022-08-27 DIAGNOSIS — J9611 Chronic respiratory failure with hypoxia: Secondary | ICD-10-CM

## 2022-08-27 DIAGNOSIS — I5021 Acute systolic (congestive) heart failure: Secondary | ICD-10-CM | POA: Diagnosis not present

## 2022-08-27 DIAGNOSIS — J4489 Other specified chronic obstructive pulmonary disease: Secondary | ICD-10-CM | POA: Diagnosis not present

## 2022-08-27 DIAGNOSIS — J9612 Chronic respiratory failure with hypercapnia: Secondary | ICD-10-CM | POA: Diagnosis not present

## 2022-08-27 DIAGNOSIS — F17210 Nicotine dependence, cigarettes, uncomplicated: Secondary | ICD-10-CM

## 2022-08-27 MED ORDER — BREZTRI AEROSPHERE 160-9-4.8 MCG/ACT IN AERO: 2.0000 | INHALATION_SPRAY | Freq: Two times a day (BID) | RESPIRATORY_TRACT | 0 refills | Status: DC

## 2022-08-27 NOTE — Progress Notes (Signed)
[name], female    DOB: 12-25-1945    MRN: [id number]   Brief patient profile:  27  yobf  active smoker  referred to pulmonary clinic in [place]  07/16/2022 by [name]  for copd / 02 dep onset of symptoms  1990s    Started on 02 x since around 2020 per pt  Inhaler dep since 2023     History of Present Illness  07/16/2022  Pulmonary/ 1st office eval/ [name] / [place] [facility] maint breztri but suboptimal technique  Chief Complaint  Patient presents with   Follow-up    Pt f/u son states that pt finished a course of prednisone on 4/3 & symptoms (SOB, coughing, wheezing) returned in 4/5. She is currently using 3.5L continuous O2 @ home & 4L on POC  Dyspnea:  grocery shopping /walking to car in driveway a challenge   Cough: smoker's rattle  Sleep: bed is flat / 2 pillows  SABA use: none 02: 2 lpm / prn daytime  > lately up to 3.5 but not really titrating effectively  Breathing much better just while on pred and worse p a few days off  Lung cancer screen: had CTa 06/07/21 neg nodules Rec Plan A = Automatic = Always=    Breztri Take 2 puffs first thing in am and then another 2 puffs about 12 hours later.  Work on inhaler technique: Prednisone 10 mg 2 each am until better then 1 daily x 5 days and stop  Plan B = Backup (to supplement plan A, not to replace it) Only use your albuterol inhaler as a rescue medication  Plan C = Crisis (instead of Plan B but only if Plan B stops working) - only use your albuterol nebulizer if you first try Plan B  Pantoprazole (protonix) 40 mg   Take  30-60 min before first meal of the day and Pepcid (famotidine)  20 mg after supper until return to office      08/27/2022  f/u ov/[name] re: COPD  maint on Breztri   Chief Complaint  Patient presents with   Follow-up    Pt f/u states that her breathing is baseline - she uses her oxygen just as needed and per pt she "waits until her levels drop low to put it on"  Dyspnea:  limited by  sciatica both  Cough: none  Sleeping: flat bed 2 pillows s resp cc SABA use: combivent 2 x daily  02: 2lpm at hs  Nose bleeds resolved off RA   Lung cancer screening :  requested     No obvious day to day or daytime variability or assoc excess/ purulent sputum or mucus plugs or hemoptysis or cp or chest tightness, subjective wheeze or overt sinus or hb symptoms.   Sleeping  without nocturnal  or early am exacerbation  of respiratory  c/o's or need for noct saba. Also denies any obvious fluctuation of symptoms with weather or environmental changes or other aggravating or alleviating factors except as outlined above   No unusual exposure hx or h/o childhood pna/ asthma or knowledge of premature birth.  Current Allergies, Complete Past Medical History, Past Surgical History, Family History, and Social History were reviewed in [employer] record.  ROS  The following are not active complaints unless bolded Hoarseness, sore throat, dysphagia, dental problems, itching, sneezing,  nasal congestion or discharge of excess mucus or purulent secretions, ear ache,   fever, chills, sweats, unintended wt loss or wt gain, classically  pleuritic or exertional cp,  orthopnea pnd or arm/hand swelling  or leg swelling, presyncope, palpitations, abdominal pain, anorexia, nausea, vomiting, diarrhea  or change in bowel habits or change in bladder habits, change in stools or change in urine, dysuria, hematuria,  rash, arthralgias, visual complaints, headache, numbness, weakness or ataxia or problems with walking or coordination,  change in mood or  memory.        Current Meds  Medication Sig   albuterol (2.5 MG/3ML) 0.083% NEBU 3 mL, albuterol (5 MG/ML) 0.5% NEBU 0.5 mL Inhale into the lungs.   albuterol (PROVENTIL) (2.5 MG/3ML) 0.083% nebulizer solution Take 3 mLs (2.5 mg total) by nebulization every 4 (four) hours as needed for wheezing or shortness of breath.   albuterol (VENTOLIN HFA) 108  (90 Base) MCG/ACT inhaler Inhale 2 puffs into the lungs every 4 (four) hours as needed for wheezing or shortness of breath.   amLODipine (NORVASC) 5 MG tablet Take 1 tablet (5 mg total) by mouth daily.   aspirin EC 81 MG EC tablet Take 1 tablet (81 mg total) by mouth daily. Swallow whole.   atorvastatin (LIPITOR) 40 MG tablet Take 1 tablet (40 mg total) by mouth daily.   Budeson-Glycopyrrol-Formoterol (BREZTRI AEROSPHERE) 160-9-4.8 MCG/ACT AERO Inhale 2 puffs into the lungs in the morning and at bedtime.   clonazePAM (KLONOPIN) 1 MG tablet Take 0.5-1 mg by mouth 4 (four) times daily as needed for anxiety.   escitalopram (LEXAPRO) 10 MG tablet Take 10 mg by mouth at bedtime.   famotidine (PEPCID) 20 MG tablet One after supper   ferrous sulfate 325 (65 FE) MG tablet Take 325 mg by mouth 3 (three) times a week.   furosemide (LASIX) 40 MG tablet Take 1 tablet (40 mg total) by mouth 3 (three) times a week.   gabapentin (NEURONTIN) 300 MG capsule Take 300 mg by mouth 3 (three) times daily.   HYDROcodone-acetaminophen (NORCO) 10-325 MG tablet Take 1 tablet by mouth every 4 (four) hours as needed (severe back pain.).   nitroGLYCERIN (NITROSTAT) 0.4 MG SL tablet Place 1 tablet (0.4 mg total) under the tongue every 5 (five) minutes as needed for chest pain.   OXYGEN Inhale 2 L into the lungs.   pantoprazole (PROTONIX) 40 MG tablet Take 30-60 min before first meal of the day   potassium chloride (KLOR-CON) 10 MEQ tablet Take 1 tablet (10 mEq total) by mouth every Monday, Wednesday, and Friday. Take 1 tab when you take the furosemide/Lasix tablet   predniSONE (DELTASONE) 10 MG tablet 2 each am until better then 1 daily x 5 days           Past Medical History:  Diagnosis Date   Arthritis    Chronic back pain    Chronic heart failure with preserved ejection fraction (HFpEF)    a. 06/2021 Echo: EF 60-65%, no rwma, GrI DD, mod-sev reduced RV fxn.   COPD (chronic obstructive pulmonary disease)    patient  is on oxygen at night when needed   Depression    GERD (gastroesophageal reflux disease)    Hypertension    Neuropathy    Nonobstructive CAD (coronary artery disease)    a. 06/2021 NSTEMI/Cath: LM nl, LAD 25d, LCX nl, RCA 30p.   PAH (pulmonary artery hypertension)    a. 04/2018 PA 55/21 (36); b. 06/2021 Echo: EF 60-65%, no rwma, GrI DD, mod-sev reduced RV fxn, mod BAE, triv MR, mod TR; c. 06/2021 Cath: PA mean 35, PVR 7 WU.  Thyroid disease    Tobacco abuse    Ulcer    [place] I        Objective:      Wt Readings from Last 3 Encounters:  08/27/22 152 lb 6.4 oz (69.1 kg)  08/13/22 148 lb (67.1 kg)  07/16/22 148 lb 9.6 oz (67.4 kg)    Vital signs reviewed  08/27/2022  - Note at rest 02 sats  84% on RA   General appearance:    amb bm rattling cough     HEENT : Oropharynx  clear / edentulous       NECK :  without  apparent JVD/ palpable Nodes/TM    LUNGS: no acc muscle use,  Mild barrel  contour chest wall with min insp /exp rhonchi  and  without cough on insp or exp maneuvers  and mild  Hyperresonant  to  percussion bilaterally     CV:  RRR  no s3 or murmur or increase in P2, and  L > R trace pitting LE Edema  ABD:  soft and nontender with pos end  insp Hoover's  in the supine position.  No bruits or organomegaly appreciated   MS:  Nl gait/ ext warm without deformities Or obvious joint restrictions  calf tenderness, cyanosis or clubbing     SKIN: warm and dry without lesions    NEURO:  alert, approp, nl sensorium with  no motor or cerebellar deficits apparent.        Assessment

## 2022-08-27 NOTE — Patient Instructions (Signed)
Plan A = Automatic = Always=    Breztri Take 2 puffs first thing in am and then another 2 puffs about 12 hours later.    Work on inhaler technique:  relax and gently blow all the way out then take a nice smooth full deep breath back in, triggering the inhaler at same time you start breathing in.  Hold breath in for at least  5 seconds if you can. Blow out [employer]  thru nose. Rinse and gargle with water when done.  If mouth or throat bother you at all,  try brushing teeth/gums/tongue with arm and hammer toothpaste/ make a slurry and gargle and spit out.   >>>  Remember how golfers warm up by taking practice swings - do this with an empty inhaler   Plan B = Backup (to supplement plan A, not to replace it) Only use your albuterol inhaler as a rescue medication to be used if you can't catch your breath by resting or doing a relaxed purse lip breathing pattern.  - The less you use it, the better it will work when you need it. - Ok to use the inhaler up to 2 puffs  every 4 hours if you must but call for appointment if use goes up over your usual need - Don't leave home without it !!  (think of it like the spare tire for your car)   Plan C = Crisis (instead of Plan B but only if Plan B stops working) - only use your albuterol nebulizer if you first try Plan B and it fails to help > ok to use the nebulizer up to every 4 hours but if start needing it regularly call for immediate appointment   Make sure you check your oxygen saturation  AT  your highest level of activity (not after you stop)   to be sure it stays over 90% and adjust  02 flow upward to maintain this level if needed but remember to turn it back to previous settings when you stop (to conserve your supply).   Please schedule a follow up visit in 3 months but call sooner if needed

## 2022-08-28 ENCOUNTER — Other Ambulatory Visit: Payer: Self-pay | Admitting: Nurse Practitioner

## 2022-08-28 DIAGNOSIS — M545 Low back pain, unspecified: Secondary | ICD-10-CM

## 2022-08-30 ENCOUNTER — Encounter: Payer: Self-pay | Admitting: Internal Medicine

## 2022-08-30 NOTE — Assessment & Plan Note (Addendum)
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          Each maintenance medication was reviewed in detail including emphasizing most importantly the difference between maintenance and prns and under what circumstances the prns are to be triggered using an action plan format where appropriate.  Total time for H and P, chart review, counseling, reviewing when to use which inhaler/neb 02 device(s) and generating customized AVS unique to this office visit / same day charting = 25 min

## 2022-08-30 NOTE — Assessment & Plan Note (Signed)
4-5 min discussion re active cigarette smoking in addition to office E&M  Ask about tobacco use:   ongoing Advise quitting   I took an extended  opportunity with this patient to outline the consequences of continued cigarette use  in airway disorders based on all the data we have from the multiple national lung health studies (perfomed over decades at millions of dollars in cost)  indicating that smoking cessation, not choice of inhalers or pulmonary physicians, is the most important aspect of her care.   Assess willingness:  Not committed at this point Assist in quit attempt:  Per PCP when ready Arrange follow up:   Follow up per Primary Care planned     Low-dose CT lung cancer screening is recommended for patients who are 81-47 years of age with a 20+ pack-year history of smoking and who are currently smoking or quit <=15 years ago. No coughing up blood  No unintentional weight loss of > 15 pounds in the last 6 months - pt is eligible for scanning yearly until age 88 > referred

## 2022-08-30 NOTE — Assessment & Plan Note (Signed)
Active smoker - PFT's  05/12/18  FEV1 1.45 (74 % ) ratio 0.72  p 29 % improvement from saba p ? prior to study with DLCO  10.67 (51%)   and FV curve mildly concave  - 07/16/2022  After extensive coaching inhaler device,  effectiveness =    60% from a baseline of 30% > continue breztry with pred as backup = 20 mg until better, 10 mg x 5 days and try off    Group D (now reclassified as E) in terms of symptom/risk and laba/lama/ICS  therefore appropriate rx at this point >>>  breztri and more approp saba use (see AVS)  - The proper method of use, as well as anticipated side effects, of a metered-dose inhaler were discussed and demonstrated to the patient using teach back method. Improved effectiveness after extensive coaching during this visit to a level of approximately 75 % from a baseline of 50 % > rec use empty device to practice (like a golfer)

## 2022-09-02 ENCOUNTER — Ambulatory Visit
Admission: RE | Admit: 2022-09-02 | Discharge: 2022-09-02 | Disposition: A | Discharge status: Home / Self Care | Payer: Medicare Other | Source: Ambulatory Visit | Attending: Nurse Practitioner | Admitting: Nurse Practitioner

## 2022-09-02 DIAGNOSIS — M545 Low back pain, unspecified: Secondary | ICD-10-CM

## 2022-09-02 MED ORDER — METHYLPREDNISOLONE ACETATE 40 MG/ML INJ SUSP (RADIOLOG
80.0000 mg | Freq: Once | INTRAMUSCULAR | Status: AC
  Administered 2022-09-02: 80 mg via EPIDURAL

## 2022-09-02 MED ORDER — IOPAMIDOL (ISOVUE-M 200) INJECTION 41%
1.0000 mL | Freq: Once | INTRAMUSCULAR | Status: AC
  Administered 2022-09-02: 1 mL via EPIDURAL

## 2022-09-02 NOTE — Discharge Instructions (Signed)

## 2022-09-28 ENCOUNTER — Encounter: Payer: Self-pay | Admitting: Internal Medicine

## 2022-09-28 ENCOUNTER — Ambulatory Visit (INDEPENDENT_AMBULATORY_CARE_PROVIDER_SITE_OTHER): Payer: Medicare Other | Admitting: Internal Medicine

## 2022-09-28 VITALS — BP 162/65 | HR 74 | Temp 97.8°F | Ht 66.0 in | Wt 140.6 lb

## 2022-09-28 DIAGNOSIS — I10 Essential (primary) hypertension: Secondary | ICD-10-CM

## 2022-09-28 DIAGNOSIS — K219 Gastro-esophageal reflux disease without esophagitis: Secondary | ICD-10-CM

## 2022-09-28 DIAGNOSIS — E785 Hyperlipidemia, unspecified: Secondary | ICD-10-CM

## 2022-09-28 DIAGNOSIS — J9612 Chronic respiratory failure with hypercapnia: Secondary | ICD-10-CM

## 2022-09-28 DIAGNOSIS — M5432 Sciatica, left side: Secondary | ICD-10-CM

## 2022-09-28 DIAGNOSIS — I272 Pulmonary hypertension, unspecified: Secondary | ICD-10-CM

## 2022-09-28 DIAGNOSIS — J441 Chronic obstructive pulmonary disease with (acute) exacerbation: Secondary | ICD-10-CM | POA: Diagnosis not present

## 2022-09-28 DIAGNOSIS — F17210 Nicotine dependence, cigarettes, uncomplicated: Secondary | ICD-10-CM

## 2022-09-28 DIAGNOSIS — J449 Chronic obstructive pulmonary disease, unspecified: Secondary | ICD-10-CM

## 2022-09-28 DIAGNOSIS — J9611 Chronic respiratory failure with hypoxia: Secondary | ICD-10-CM

## 2022-09-28 DIAGNOSIS — I50810 Right heart failure, unspecified: Secondary | ICD-10-CM | POA: Diagnosis not present

## 2022-09-28 DIAGNOSIS — R112 Nausea with vomiting, unspecified: Secondary | ICD-10-CM

## 2022-09-28 DIAGNOSIS — I509 Heart failure, unspecified: Secondary | ICD-10-CM

## 2022-09-28 DIAGNOSIS — F419 Anxiety disorder, unspecified: Secondary | ICD-10-CM

## 2022-09-28 DIAGNOSIS — M5431 Sciatica, right side: Secondary | ICD-10-CM

## 2022-09-28 DIAGNOSIS — I214 Non-ST elevation (NSTEMI) myocardial infarction: Secondary | ICD-10-CM

## 2022-09-28 MED ORDER — PROMETHAZINE HCL 12.5 MG PO TABS
12.5000 mg | ORAL_TABLET | Freq: Four times a day (QID) | ORAL | 0 refills | Status: DC | PRN
Start: 2022-09-28 — End: 2023-01-22

## 2022-09-28 NOTE — Progress Notes (Signed)
New Patient Office Visit  Subjective    Patient ID: [name], female    DOB: September 20, 1945  Age: 77 y.o. MRN: [id number]  CC:  Chief Complaint  Patient presents with   Establish Care   GI Problem    States she got too hot yesterday, had episodes of nausea, vomiting and diarrhea on Sunday. Has CHF and tries to limit her fluids and may be dehydrated after the diarrhea and vomiting     HPI [name] presents to establish care.  She is a 77 year old woman with a past medical history significant for COPD, chronic hypoxic respiratory failure with baseline O2 requirement of 3.5 L via [place],, pulmonary hypertension with RV failure, CAD, HTN, HLD, current tobacco use, chronic pain secondary to sciatica, neuropathy, GERD, and anxiety and depression. Previously followed by Dr. Sudie Bailey.  Currently under hospice care due to CHF/COPD.  [name] endorses fatigue today.  Yesterday she experienced episodes of nausea with vomiting and diarrhea.  She is accompanied by her son, who is concerned that she may be dehydrated.  She does not have any additional concerns today.  Acute concerns, chronic medical conditions, and outstanding preventative care items discussed today are individually addressed A/P below.   Outpatient Encounter Medications as of 09/28/2022  Medication Sig   albuterol (2.5 MG/3ML) 0.083% NEBU 3 mL, albuterol (5 MG/ML) 0.5% NEBU 0.5 mL Inhale into the lungs.   albuterol (PROVENTIL) (2.5 MG/3ML) 0.083% nebulizer solution Take 3 mLs (2.5 mg total) by nebulization every 4 (four) hours as needed for wheezing or shortness of breath.   albuterol (VENTOLIN HFA) 108 (90 Base) MCG/ACT inhaler Inhale 2 puffs into the lungs every 4 (four) hours as needed for wheezing or shortness of breath.   atorvastatin (LIPITOR) 40 MG tablet Take 1 tablet (40 mg total) by mouth daily.   Budeson-Glycopyrrol-Formoterol (BREZTRI AEROSPHERE) 160-9-4.8 MCG/ACT AERO Inhale 2 puffs into the lungs in  the morning and at bedtime.   Budeson-Glycopyrrol-Formoterol (BREZTRI AEROSPHERE) 160-9-4.8 MCG/ACT AERO Inhale 2 puffs into the lungs in the morning and at bedtime.   clonazePAM (KLONOPIN) 1 MG tablet Take 0.5-1 mg by mouth 4 (four) times daily as needed for anxiety.   escitalopram (LEXAPRO) 10 MG tablet Take 10 mg by mouth at bedtime.   famotidine (PEPCID) 20 MG tablet One after supper   furosemide (LASIX) 40 MG tablet Take 1 tablet (40 mg total) by mouth 3 (three) times a week.   gabapentin (NEURONTIN) 300 MG capsule Take 300 mg by mouth 3 (three) times daily.   HYDROcodone-acetaminophen (NORCO) 10-325 MG tablet Take 1 tablet by mouth every 4 (four) hours as needed (severe back pain.).   nitroGLYCERIN (NITROSTAT) 0.4 MG SL tablet Place 1 tablet (0.4 mg total) under the tongue every 5 (five) minutes as needed for chest pain.   OXYGEN Inhale 2 L into the lungs.   pantoprazole (PROTONIX) 40 MG tablet Take 30-60 min before first meal of the day   potassium chloride (KLOR-CON) 10 MEQ tablet Take 1 tablet (10 mEq total) by mouth every Monday, Wednesday, and Friday. Take 1 tab when you take the furosemide/Lasix tablet   promethazine (PHENERGAN) 12.5 MG tablet Take 1 tablet (12.5 mg total) by mouth every 6 (six) hours as needed for nausea or vomiting.   ferrous sulfate 325 (65 FE) MG tablet Take 325 mg by mouth 3 (three) times a week. (Patient not taking: Reported on 09/28/2022)   [DISCONTINUED] amLODipine (NORVASC) 5 MG tablet Take 1 tablet (  5 mg total) by mouth daily. (Patient not taking: Reported on 09/28/2022)   No facility-administered encounter medications on file as of 09/28/2022.    Past Medical History:  Diagnosis Date   Arthritis    Chronic back pain    Chronic heart failure with preserved ejection fraction (HFpEF) (HCC)    a. 06/2021 Echo: EF 60-65%, no rwma, GrI DD, mod-sev reduced RV fxn.   COPD (chronic obstructive pulmonary disease) (HCC)    patient is on oxygen at night when needed    Depression    GERD (gastroesophageal reflux disease)    Hypertension    Neuropathy    Nonobstructive CAD (coronary artery disease)    a. 06/2021 NSTEMI/Cath: LM nl, LAD 25d, LCX nl, RCA 30p.   PAH (pulmonary artery hypertension) (HCC)    a. 04/2018 PA 55/21 (36); b. 06/2021 Echo: EF 60-65%, no rwma, GrI DD, mod-sev reduced RV fxn, mod BAE, triv MR, mod TR; c. 06/2021 Cath: PA mean 35, PVR 7 WU.   Thyroid disease    Tobacco abuse    Ulcer    Billroth I    Past Surgical History:  Procedure Laterality Date   ABDOMINAL HYSTERECTOMY     BACK SURGERY     X4   BILROTH I PROCEDURE     BIOPSY  12/18/2019   Procedure: BIOPSY;  Surgeon: Corbin Ade, MD;  Location: [facility];  Service: Endoscopy;;   BIOPSY  12/27/2020   Procedure: BIOPSY;  Surgeon: Corbin Ade, MD;  Location: [facility];  Service: Endoscopy;;   CHOLECYSTECTOMY     COLONOSCOPY  05/18/2003   [id number] colonoscopy/ Internal hemorrhoids.  Otherwise, normal rectum   COLONOSCOPY  08/12/2010   Dr. Lovell Sheehan: cecum visualized and normal, colon and rectum normal. Torturous colon   COLONOSCOPY N/A 09/07/2012   RMR: tubular adenoma, lipoma. Due for surveillance in 2021   COLONOSCOPY WITH PROPOFOL N/A 12/18/2019   Rourk: Three 2-21mm polyps removed from the descending colon, tubular adenomas.  Diverticulosis.  Noncompliant left colon.   ESOPHAGOGASTRODUODENOSCOPY  05/18/2003   RMR:. Normal esophagus/Adenomatous-appearing mucosa at the anastomosis   ESOPHAGOGASTRODUODENOSCOPY  08/12/2010   Dr. Lovell Sheehan: anastomosis widely patent, no ulcerations, CLO test negative   ESOPHAGOGASTRODUODENOSCOPY (EGD) WITH PROPOFOL N/A 12/18/2019   Rourk: Normal esophagus status post dilation.  Prior hemigastrectomy.  Small bowel nodule of uncertain significance.  Biopsy with mild reactive changes and focal intestinal metaplasia.  Recommend 1 year follow-up EGD.   ESOPHAGOGASTRODUODENOSCOPY (EGD) WITH PROPOFOL N/A 12/27/2020   Rourk:Normal  esophagus s/p dilation, s/p hemigastrectomy, Billroth I configuration, anastomotic nodule biopsied, polypoid duodenal mucosa with surface gastric foveolar metaplasia s/p peptic injury, no malignancy   MALONEY DILATION N/A 12/18/2019   Procedure: MALONEY DILATION;  Surgeon: Corbin Ade, MD;  [facility]: [facility];  Service: Endoscopy;  Laterality: N/A;   MALONEY DILATION N/A 12/27/2020   Procedure: Elease Hashimoto DILATION;  Surgeon: Corbin Ade, MD;  Location: [facility];  Service: Endoscopy;  Laterality: N/A;   POLYPECTOMY  12/18/2019   Procedure: POLYPECTOMY;  Surgeon: Corbin Ade, MD;  [facility];  Service: Endoscopy;;   RIGHT HEART CATH N/A 05/04/2018   Procedure: RIGHT HEART CATH;  Surgeon: Laurey Morale, MD;  Location[facility];  Service: Cardiovascular;  Laterality: N/A;   RIGHT/LEFT HEART CATH AND CORONARY ANGIOGRAPHY N/A 06/06/2021   Procedure: RIGHT/LEFT HEART CATH AND CORONARY ANGIOGRAPHY;  Surgeon: [place], Peter M, MD;  Location: [facility] INVASIVE CV LAB;  Service: Cardiovascular;  Laterality: N/A;   STOMACH SURGERY     removed partial stomach   YAG LASER APPLICATION Left 01/14/2016   Procedure: YAG LASER APPLICATION;  Surgeon: Jethro Bolus, MD;  Location: [facility];  Service: Ophthalmology;  Laterality: Left;   YAG LASER APPLICATION Right 01/28/2016   Procedure: YAG LASER APPLICATION;  Surgeon: Jethro Bolus, MD;  Location: [facility];  Service: Ophthalmology;  Laterality: Right;    Family History  Problem Relation Age of Onset   Cancer Sister        breast cancer   Cancer Brother 57       esophageal cancer   Cancer Maternal Aunt        pancreas?   Cancer Paternal Aunt        multiple aunts had cancer   Colon cancer Neg Hx     Social History   Socioeconomic History   Marital status: Married    Spouse name: Not on file   Number of children: Not on file   Years of education: Not on file   Highest education level: Not on file  Occupational History    Not on file  Tobacco Use   Smoking status: Every Day    Packs/day: 0.50    Years: 56.00    Additional pack years: 0.00    Total pack years: 28.00    Types: Cigarettes   Smokeless tobacco: Never   Tobacco comments:    1/2 ppd 09/22/21   Vaping Use   Vaping Use: Never used  Substance and Sexual Activity   Alcohol use: No   Drug use: No   Sexual activity: Not Currently  Other Topics Concern   Not on file  Social History Narrative   Not on file   Social Determinants of Health   Financial Resource Strain: Not on file  Food Insecurity: Not on file  Transportation Needs: Not on file  Physical Activity: Not on file  Stress: Not on file  Social Connections: Not on file  Intimate Partner Violence: Not on file   Review of Systems  Constitutional:  Positive for malaise/fatigue. Negative for chills and fever.  HENT:  Negative for sore throat.   Respiratory:  Negative for cough and shortness of breath.   Cardiovascular:  Negative for chest pain, palpitations and leg swelling.  Gastrointestinal:  Negative for abdominal pain, blood in stool, constipation, diarrhea, nausea and vomiting.  Genitourinary:  Negative for dysuria and hematuria.  Musculoskeletal:  Negative for myalgias.  Skin:  Negative for itching and rash.  Neurological:  Negative for dizziness and headaches.  Psychiatric/Behavioral:  Negative for depression and suicidal ideas.     Objective    BP (!) 162/65   Pulse 74   Temp 97.8 F (36.6 C) (Oral)   Ht 5\' 6"  (1.676 m)   Wt 140 lb 9.6 oz (63.8 kg)   BMI 22.69 kg/m   Physical Exam Vitals reviewed.  Constitutional:      General: She is not in acute distress.    Appearance: Normal appearance. She is not toxic-appearing.  HENT:     Head: Normocephalic and atraumatic.     Right Ear: External ear normal.     Left Ear: External ear normal.     Nose: Nose normal. No congestion or rhinorrhea.     Mouth/Throat:     Mouth: Mucous membranes are moist.      Pharynx: Oropharynx is clear. No oropharyngeal exudate or posterior oropharyngeal erythema.  Eyes:  General: No scleral icterus.    Extraocular Movements: Extraocular movements intact.     Conjunctiva/sclera: Conjunctivae normal.     Pupils: Pupils are equal, round, and reactive to light.  Cardiovascular:     Rate and Rhythm: Normal rate and regular rhythm.     Pulses: Normal pulses.     Heart sounds: Normal heart sounds. No murmur heard.    No friction rub. No gallop.  Pulmonary:     Effort: Pulmonary effort is normal.     Breath sounds: Normal breath sounds. No wheezing, rhonchi or rales.     Comments: Examined on 3.5 L [place] Abdominal:     General: Abdomen is flat. Bowel sounds are normal. There is no distension.     Palpations: Abdomen is soft.     Tenderness: There is no abdominal tenderness.  Musculoskeletal:        General: No swelling. Normal range of motion.     Cervical back: Normal range of motion.     Right lower leg: No edema.     Left lower leg: No edema.  Lymphadenopathy:     Cervical: No cervical adenopathy.  Skin:    General: Skin is warm and dry.     Capillary Refill: Capillary refill takes less than 2 seconds.     Coloration: Skin is not jaundiced.  Neurological:     General: No focal deficit present.     Mental Status: She is alert and oriented to person, place, and time.  Psychiatric:        Mood and Affect: Mood normal.        Behavior: Behavior normal.    Assessment & Plan:   Problem List Items Addressed This Visit       NSTEMI (non-ST elevated myocardial infarction) (HCC)    History of CAD with NSTEMI.  She is currently prescribed atorvastatin 40 mg daily.  Not currently on ASA 81 mg due to epistaxis.      CHF (congestive heart failure) (HCC)    Euvolemic on exam today.  She is currently prescribed Lasix 40 mg M/W/F      Essential hypertension    History of HTN but not on any antihypertensive medications currently      COPD exacerbation  (HCC)   Relevant Medications   promethazine (PHENERGAN) 12.5 MG tablet   Chronic obstructive pulmonary disease (HCC)    History of COPD.  Closely followed by pulmonology.  She is prescribed Breztri as well as albuterol nebulizer solution and inhaler for as needed use.  Pulmonary exam today is unremarkable.      Chronic respiratory failure with hypoxia and hypercapnia (HCC)    In the setting of COPD and PHTN with RV failure.  She has a baseline O2 requirement of 3.5 L [place].  O2 sats today are adequate.      GERD (gastroesophageal reflux disease)    Symptoms are adequately controlled with Protonix and Pepcid.      Nausea with vomiting    She has recently experienced multiple episodes of nausea with vomiting.  Phenergan requested for as needed nausea relief. -Phenergan prescribed today -Baseline labs ordered      Sciatica    History of bilateral sciatica.  Pain is controlled with Norco and gabapentin 300 mg 3 times daily.      Cigarette smoker    She continues to smoke 0.5 packs/day and has been smoking since age 28.  She remains precontemplative with regards to cessation. -The patient was counseled  on the dangers of tobacco use, and was advised to quit and reluctant to quit.  Reviewed strategies to maximize success, including removing cigarettes and smoking materials from environment, stress management, substitution of other forms of reinforcement, support of family/friends, and written materials.       Hyperlipidemia    History of hyperlipidemia.  Currently prescribed atorvastatin 40 mg daily.  Repeat lipid panel ordered today.      Anxiety and depression    Mood is currently stable and anxiety well-controlled.  She is prescribed Lexapro 10 mg daily as well as Klonopin 0.5 mg 1-2 times per day as needed.       Return in about 3 months (around 12/29/2022).   Billie Lade, MD

## 2022-09-28 NOTE — Patient Instructions (Signed)
It was a pleasure to see you today.  Thank you for giving [place] the opportunity to be involved in your care.  Below is a brief recap of your visit and next steps.  We will plan to see you again in 3 months.  Summary You have established care today We will check basic labs Phenergan prescribed for as needed nausea relief Follow up in 3 months

## 2022-09-29 ENCOUNTER — Other Ambulatory Visit: Payer: Self-pay | Admitting: *Deleted

## 2022-09-29 ENCOUNTER — Other Ambulatory Visit: Payer: Self-pay

## 2022-09-29 DIAGNOSIS — E871 Hypo-osmolality and hyponatremia: Secondary | ICD-10-CM

## 2022-09-29 DIAGNOSIS — Z87891 Personal history of nicotine dependence: Secondary | ICD-10-CM

## 2022-09-29 DIAGNOSIS — F17210 Nicotine dependence, cigarettes, uncomplicated: Secondary | ICD-10-CM

## 2022-09-29 DIAGNOSIS — Z122 Encounter for screening for malignant neoplasm of respiratory organs: Secondary | ICD-10-CM

## 2022-09-29 LAB — LIPID PANEL
Chol/HDL Ratio: 2.7 ratio (ref 0.0–4.4)
Cholesterol, Total: 222 mg/dL — ABNORMAL HIGH (ref 100–199)
LDL Chol Calc (NIH): 130 mg/dL — ABNORMAL HIGH (ref 0–99)
Triglycerides: 61 mg/dL (ref 0–149)

## 2022-09-29 LAB — CMP14+EGFR
ALT: 11 IU/L (ref 0–32)
Chloride: 84 mmol/L — ABNORMAL LOW (ref 96–106)
Potassium: 4.1 mmol/L (ref 3.5–5.2)
Sodium: 125 mmol/L — ABNORMAL LOW (ref 134–144)

## 2022-09-29 LAB — TSH+FREE T4: TSH: 0.389 u[IU]/mL — ABNORMAL LOW (ref 0.450–4.500)

## 2022-09-29 LAB — CBC WITH DIFFERENTIAL/PLATELET

## 2022-09-30 LAB — CMP14+EGFR
AST: 20 IU/L (ref 0–40)
Alkaline Phosphatase: 172 IU/L — ABNORMAL HIGH (ref 44–121)
BUN/Creatinine Ratio: 19 (ref 12–28)

## 2022-09-30 LAB — CBC WITH DIFFERENTIAL/PLATELET
Basos: 1 %
EOS (ABSOLUTE): 0 10*3/uL (ref 0.0–0.4)
Hematocrit: 49 % — ABNORMAL HIGH (ref 34.0–46.6)
Lymphocytes Absolute: 0.6 10*3/uL — ABNORMAL LOW (ref 0.7–3.1)
MCH: 22.3 pg — ABNORMAL LOW (ref 26.6–33.0)
MCHC: 29.6 g/dL — ABNORMAL LOW (ref 31.5–35.7)
Neutrophils: 83 %
WBC: 5.3 10*3/uL (ref 3.4–10.8)

## 2022-09-30 LAB — B12 AND FOLATE PANEL

## 2022-09-30 LAB — VITAMIN D 25 HYDROXY (VIT D DEFICIENCY, FRACTURES)

## 2022-10-01 ENCOUNTER — Other Ambulatory Visit: Payer: Self-pay

## 2022-10-01 LAB — CBC WITH DIFFERENTIAL/PLATELET
Basophils Absolute: 0 10*3/uL (ref 0.0–0.2)
Hemoglobin: 14.5 g/dL (ref 11.1–15.9)
Immature Grans (Abs): 0 10*3/uL (ref 0.0–0.1)
RDW: 17.6 % — ABNORMAL HIGH (ref 11.7–15.4)

## 2022-10-01 LAB — CMP14+EGFR
Creatinine, Ser: 0.52 mg/dL — ABNORMAL LOW (ref 0.57–1.00)
Globulin, Total: 3 g/dL (ref 1.5–4.5)
Glucose: 103 mg/dL — ABNORMAL HIGH (ref 70–99)

## 2022-10-01 LAB — TSH+FREE T4: Free T4: 1.54 ng/dL (ref 0.82–1.77)

## 2022-10-01 LAB — LIPID PANEL: VLDL Cholesterol Cal: 11 mg/dL (ref 5–40)

## 2022-10-02 ENCOUNTER — Encounter: Payer: Self-pay | Admitting: Internal Medicine

## 2022-10-02 DIAGNOSIS — I10 Essential (primary) hypertension: Secondary | ICD-10-CM | POA: Insufficient documentation

## 2022-10-02 DIAGNOSIS — E785 Hyperlipidemia, unspecified: Secondary | ICD-10-CM | POA: Insufficient documentation

## 2022-10-02 DIAGNOSIS — M5430 Sciatica, unspecified side: Secondary | ICD-10-CM | POA: Insufficient documentation

## 2022-10-02 DIAGNOSIS — F419 Anxiety disorder, unspecified: Secondary | ICD-10-CM | POA: Insufficient documentation

## 2022-10-02 DIAGNOSIS — R112 Nausea with vomiting, unspecified: Secondary | ICD-10-CM | POA: Insufficient documentation

## 2022-10-02 LAB — B12 AND FOLATE PANEL: Folate: 11.8 ng/mL (ref >3.0)

## 2022-10-02 LAB — CBC WITH DIFFERENTIAL/PLATELET
Eos: 0 %
Immature Granulocytes: 1 %
Lymphs: 11 %
MCV: 75 fL — ABNORMAL LOW (ref 79–97)
Monocytes Absolute: 0.2 10*3/uL (ref 0.1–0.9)
Monocytes: 4 %
Neutrophils Absolute: 4.5 10*3/uL (ref 1.4–7.0)
Platelets: 112 10*3/uL — ABNORMAL LOW (ref 150–450)

## 2022-10-02 LAB — CMP14+EGFR
Albumin: 4.3 g/dL (ref 3.8–4.8)
BUN: 10 mg/dL (ref 8–27)
Bilirubin Total: 0.8 mg/dL (ref 0.0–1.2)
CO2: 23 mmol/L (ref 20–29)
Calcium: 9.7 mg/dL (ref 8.7–10.3)
Total Protein: 7.3 g/dL (ref 6.0–8.5)
eGFR: 96 mL/min/{1.73_m2} (ref >59)

## 2022-10-02 LAB — LIPID PANEL: HDL: 81 mg/dL (ref >39)

## 2022-10-02 NOTE — Assessment & Plan Note (Signed)
Mood is currently stable and anxiety well-controlled.  She is prescribed Lexapro 10 mg daily as well as Klonopin 0.5 mg 1-2 times per day as needed.

## 2022-10-02 NOTE — Assessment & Plan Note (Signed)
History of hyperlipidemia.  Currently prescribed atorvastatin 40 mg daily.  Repeat lipid panel ordered today.

## 2022-10-02 NOTE — Assessment & Plan Note (Signed)
She continues to smoke 0.5 packs/day and has been smoking since age 77.  She remains precontemplative with regards to cessation. -The patient was counseled on the dangers of tobacco use, and was advised to quit and reluctant to quit.  Reviewed strategies to maximize success, including removing cigarettes and smoking materials from environment, stress management, substitution of other forms of reinforcement, support of family/friends, and written materials.

## 2022-10-02 NOTE — Assessment & Plan Note (Signed)
History of COPD.  Closely followed by pulmonology.  She is prescribed Breztri as well as albuterol nebulizer solution and inhaler for as needed use.  Pulmonary exam today is unremarkable.

## 2022-10-02 NOTE — Assessment & Plan Note (Signed)
Symptoms are adequately controlled with Protonix and Pepcid.

## 2022-10-02 NOTE — Assessment & Plan Note (Signed)
In the setting of COPD and PHTN with RV failure.  She has a baseline O2 requirement of 3.5 L [place].  O2 sats today are adequate.

## 2022-10-02 NOTE — Assessment & Plan Note (Signed)
History of bilateral sciatica.  Pain is controlled with Norco and gabapentin 300 mg 3 times daily.

## 2022-10-02 NOTE — Assessment & Plan Note (Signed)
Euvolemic on exam today.  She is currently prescribed Lasix 40 mg M/W/F

## 2022-10-02 NOTE — Assessment & Plan Note (Signed)
History of HTN but not on any antihypertensive medications currently

## 2022-10-02 NOTE — Assessment & Plan Note (Signed)
She has recently experienced multiple episodes of nausea with vomiting.  Phenergan requested for as needed nausea relief. -Phenergan prescribed today -Baseline labs ordered

## 2022-10-02 NOTE — Assessment & Plan Note (Signed)
History of CAD with NSTEMI.  She is currently prescribed atorvastatin 40 mg daily.  Not currently on ASA 81 mg due to epistaxis.

## 2022-10-03 LAB — BMP8+EGFR
BUN/Creatinine Ratio: 20 (ref 12–28)
BUN: 11 mg/dL (ref 8–27)
CO2: 22 mmol/L (ref 20–29)
Calcium: 8.9 mg/dL (ref 8.7–10.3)
Chloride: 90 mmol/L — ABNORMAL LOW (ref 96–106)
Creatinine, Ser: 0.54 mg/dL — ABNORMAL LOW (ref 0.57–1.00)
Glucose: 117 mg/dL — ABNORMAL HIGH (ref 70–99)
Potassium: 4.1 mmol/L (ref 3.5–5.2)
Sodium: 128 mmol/L — ABNORMAL LOW (ref 134–144)
eGFR: 95 mL/min/{1.73_m2} (ref >59)

## 2022-10-05 ENCOUNTER — Other Ambulatory Visit: Payer: Self-pay

## 2022-10-05 DIAGNOSIS — E871 Hypo-osmolality and hyponatremia: Secondary | ICD-10-CM

## 2022-10-22 ENCOUNTER — Other Ambulatory Visit: Payer: Self-pay | Admitting: Family Medicine

## 2022-10-22 DIAGNOSIS — M545 Low back pain, unspecified: Secondary | ICD-10-CM

## 2022-10-29 ENCOUNTER — Ambulatory Visit
Admission: RE | Admit: 2022-10-29 | Discharge: 2022-10-29 | Disposition: A | Discharge status: Home / Self Care | Payer: Medicare Other | Source: Ambulatory Visit | Attending: Family Medicine | Admitting: Family Medicine

## 2022-10-29 DIAGNOSIS — M545 Low back pain, unspecified: Secondary | ICD-10-CM

## 2022-10-29 MED ORDER — METHYLPREDNISOLONE ACETATE 40 MG/ML INJ SUSP (RADIOLOG
80.0000 mg | Freq: Once | INTRAMUSCULAR | Status: AC
  Administered 2022-10-29: 80 mg via EPIDURAL

## 2022-10-29 MED ORDER — IOPAMIDOL (ISOVUE-M 200) INJECTION 41%
1.0000 mL | Freq: Once | INTRAMUSCULAR | Status: AC
  Administered 2022-10-29: 1 mL via EPIDURAL

## 2022-10-29 NOTE — Discharge Instructions (Signed)

## 2022-11-06 ENCOUNTER — Encounter: Payer: Self-pay | Admitting: Physician Assistant

## 2022-11-06 ENCOUNTER — Ambulatory Visit (INDEPENDENT_AMBULATORY_CARE_PROVIDER_SITE_OTHER): Payer: Medicare Other | Admitting: Physician Assistant

## 2022-11-06 DIAGNOSIS — Z91199 Patient's noncompliance with other medical treatment and regimen due to unspecified reason: Secondary | ICD-10-CM

## 2022-11-06 NOTE — Progress Notes (Signed)
Pt was scheduled for shared decision making today however I could not reach her.  I spoke with her son who said there maybe connectivity issues in her area today.  Darcella Gasman , PA-C

## 2022-11-09 ENCOUNTER — Ambulatory Visit (HOSPITAL_COMMUNITY): Payer: Medicare Other

## 2022-11-18 ENCOUNTER — Ambulatory Visit (HOSPITAL_COMMUNITY): Payer: Medicare Other

## 2022-12-01 ENCOUNTER — Ambulatory Visit (HOSPITAL_COMMUNITY)
Admission: RE | Admit: 2022-12-01 | Discharge: 2022-12-01 | Disposition: A | Discharge status: Home / Self Care | Payer: Medicare Other | Source: Ambulatory Visit | Attending: Acute Care | Admitting: Acute Care

## 2022-12-01 DIAGNOSIS — Z122 Encounter for screening for malignant neoplasm of respiratory organs: Secondary | ICD-10-CM | POA: Insufficient documentation

## 2022-12-01 DIAGNOSIS — Z87891 Personal history of nicotine dependence: Secondary | ICD-10-CM | POA: Insufficient documentation

## 2022-12-01 DIAGNOSIS — F17210 Nicotine dependence, cigarettes, uncomplicated: Secondary | ICD-10-CM | POA: Insufficient documentation

## 2022-12-24 ENCOUNTER — Ambulatory Visit (INDEPENDENT_AMBULATORY_CARE_PROVIDER_SITE_OTHER)

## 2022-12-24 VITALS — BP 121/70 | Ht 66.0 in | Wt 140.0 lb

## 2022-12-24 DIAGNOSIS — Z0000 Encounter for general adult medical examination without abnormal findings: Secondary | ICD-10-CM | POA: Diagnosis not present

## 2022-12-24 DIAGNOSIS — Z0100 Encounter for examination of eyes and vision without abnormal findings: Secondary | ICD-10-CM

## 2022-12-24 NOTE — Progress Notes (Signed)
Because this visit was a virtual/telehealth visit,  certain criteria was not obtained, such a blood pressure, CBG if applicable, and timed get up and go. Any medications not marked as "taking" were not mentioned during the medication reconciliation part of the visit. Any vitals not documented were not able to be obtained due to this being a telehealth visit or patient was unable to self-report a recent blood pressure reading due to a lack of equipment at home via telehealth. Vitals that have been documented are verbally provided by the patient.   Subjective:   [name] is a 77 y.o. female who presents for an Initial Medicare Annual Wellness Visit.  Visit Complete: Virtual  I connected with  [name] on 12/24/22 by a audio enabled telemedicine application and verified that I am speaking with the correct person using two identifiers.  Patient Location: Home  Provider Location: Home Office  I discussed the limitations of evaluation and management by telemedicine. The patient expressed understanding and agreed to proceed.  Patient Medicare AWV questionnaire was completed by the patient on na; I have confirmed that all information answered by patient is correct and no changes since this date.  Cardiac Risk Factors include: advanced age (>53men, >67 women);dyslipidemia;hypertension;smoking/ tobacco exposure;sedentary lifestyle;Other (see comment), Risk factor comments: COPD, on oxygen     Objective:    Today's Vitals   12/24/22 1259 12/24/22 1300  BP: 121/70   Weight: 140 lb (63.5 kg)   Height: 5\' 6"  (1.676 m)   PainSc:  7    Body mass index is 22.6 kg/m.     12/24/2022   12:58 PM 11/25/2021   10:05 AM 09/19/2021    2:14 PM 06/06/2021    7:59 PM 06/05/2021    3:31 PM 12/27/2020   10:10 AM 12/25/2020   11:48 AM  Advanced Directives  Does Patient Have a Medical Advance Directive? No No Yes Yes;No Yes No No  Type of Advance Directive   Healthcare Power of  Attorney      Does patient want to make changes to medical advance directive?    No - Patient declined     Would patient like information on creating a medical advance directive? No - Patient declined No - Patient declined  No - Patient declined  No - Patient declined No - Patient declined    Current Medications (verified) Outpatient Encounter Medications as of 12/24/2022  Medication Sig   albuterol (2.5 MG/3ML) 0.083% NEBU 3 mL, albuterol (5 MG/ML) 0.5% NEBU 0.5 mL Inhale into the lungs.   albuterol (PROVENTIL) (2.5 MG/3ML) 0.083% nebulizer solution Take 3 mLs (2.5 mg total) by nebulization every 4 (four) hours as needed for wheezing or shortness of breath.   albuterol (VENTOLIN HFA) 108 (90 Base) MCG/ACT inhaler Inhale 2 puffs into the lungs every 4 (four) hours as needed for wheezing or shortness of breath.   atorvastatin (LIPITOR) 40 MG tablet Take 1 tablet (40 mg total) by mouth daily.   Budeson-Glycopyrrol-Formoterol (BREZTRI AEROSPHERE) 160-9-4.8 MCG/ACT AERO Inhale 2 puffs into the lungs in the morning and at bedtime.   Budeson-Glycopyrrol-Formoterol (BREZTRI AEROSPHERE) 160-9-4.8 MCG/ACT AERO Inhale 2 puffs into the lungs in the morning and at bedtime.   clonazePAM (KLONOPIN) 1 MG tablet Take 0.5-1 mg by mouth 4 (four) times daily as needed for anxiety.   escitalopram (LEXAPRO) 10 MG tablet Take 10 mg by mouth at bedtime.   famotidine (PEPCID) 20 MG tablet One after supper   ferrous sulfate 325 (65  FE) MG tablet Take 325 mg by mouth 3 (three) times a week.   furosemide (LASIX) 40 MG tablet Take 1 tablet (40 mg total) by mouth 3 (three) times a week.   gabapentin (NEURONTIN) 300 MG capsule Take 300 mg by mouth 3 (three) times daily.   HYDROcodone-acetaminophen (NORCO) 10-325 MG tablet Take 1 tablet by mouth every 4 (four) hours as needed (severe back pain.).   nitroGLYCERIN (NITROSTAT) 0.4 MG SL tablet Place 1 tablet (0.4 mg total) under the tongue every 5 (five) minutes as needed for  chest pain.   OXYGEN Inhale 2 L into the lungs.   pantoprazole (PROTONIX) 40 MG tablet Take 30-60 min before first meal of the day   potassium chloride (KLOR-CON) 10 MEQ tablet Take 1 tablet (10 mEq total) by mouth every Monday, Wednesday, and Friday. Take 1 tab when you take the furosemide/Lasix tablet   promethazine (PHENERGAN) 12.5 MG tablet Take 1 tablet (12.5 mg total) by mouth every 6 (six) hours as needed for nausea or vomiting.   No facility-administered encounter medications on file as of 12/24/2022.    Allergies (verified) Chantix [varenicline], Codeine, Opsumit [macitentan], Tadalafil, Tyvaso [treprostinil], and Oxycodone   History: Past Medical History:  Diagnosis Date   Arthritis    Chronic back pain    Chronic heart failure with preserved ejection fraction (HFpEF) (HCC)    a. 06/2021 Echo: EF 60-65%, no rwma, GrI DD, mod-sev reduced RV fxn.   COPD (chronic obstructive pulmonary disease) (HCC)    patient is on oxygen at night when needed   Depression    GERD (gastroesophageal reflux disease)    Hypertension    Neuropathy    Nonobstructive CAD (coronary artery disease)    a. 06/2021 NSTEMI/Cath: LM nl, LAD 25d, LCX nl, RCA 30p.   PAH (pulmonary artery hypertension) (HCC)    a. 04/2018 PA 55/21 (36); b. 06/2021 Echo: EF 60-65%, no rwma, GrI DD, mod-sev reduced RV fxn, mod BAE, triv MR, mod TR; c. 06/2021 Cath: PA mean 35, PVR 7 WU.   Thyroid disease    Tobacco abuse    Ulcer    Billroth I   Past Surgical History:  Procedure Laterality Date   ABDOMINAL HYSTERECTOMY     BACK SURGERY     X4   BILROTH I PROCEDURE     BIOPSY  12/18/2019   Procedure: BIOPSY;  Surgeon: Corbin Ade, MD;  Location: [facility];  Service: Endoscopy;;   BIOPSY  12/27/2020   Procedure: BIOPSY;  Surgeon: Corbin Ade, MD;  Location: [facility];  Service: Endoscopy;;   CHOLECYSTECTOMY     COLONOSCOPY  05/18/2003   [id number] colonoscopy/ Internal hemorrhoids.  Otherwise, normal  rectum   COLONOSCOPY  08/12/2010   Dr. Lovell Sheehan: cecum visualized and normal, colon and rectum normal. Torturous colon   COLONOSCOPY N/A 09/07/2012   RMR: tubular adenoma, lipoma. Due for surveillance in 2021   COLONOSCOPY WITH PROPOFOL N/A 12/18/2019   Rourk: Three 2-16mm polyps removed from the descending colon, tubular adenomas.  Diverticulosis.  Noncompliant left colon.   ESOPHAGOGASTRODUODENOSCOPY  05/18/2003   RMR:. Normal esophagus/Adenomatous-appearing mucosa at the anastomosis   ESOPHAGOGASTRODUODENOSCOPY  08/12/2010   Dr. Lovell Sheehan: anastomosis widely patent, no ulcerations, CLO test negative   ESOPHAGOGASTRODUODENOSCOPY (EGD) WITH PROPOFOL N/A 12/18/2019   Rourk: Normal esophagus status post dilation.  Prior hemigastrectomy.  Small bowel nodule of uncertain significance.  Biopsy with mild reactive changes and focal intestinal metaplasia.  Recommend 1 year follow-up  EGD.   ESOPHAGOGASTRODUODENOSCOPY (EGD) WITH PROPOFOL N/A 12/27/2020   Rourk:Normal esophagus s/p dilation, s/p hemigastrectomy, Billroth I configuration, anastomotic nodule biopsied, polypoid duodenal mucosa with surface gastric foveolar metaplasia s/p peptic injury, no malignancy   MALONEY DILATION N/A 12/18/2019   Procedure: MALONEY DILATION;  Surgeon: Corbin Ade, MD;  [facility]: [facility];  Service: Endoscopy;  Laterality: N/A;   MALONEY DILATION N/A 12/27/2020   Procedure: Elease Hashimoto DILATION;  Surgeon: Corbin Ade, MD;  Location: [facility];  Service: Endoscopy;  Laterality: N/A;   POLYPECTOMY  12/18/2019   Procedure: POLYPECTOMY;  Surgeon: Corbin Ade, MD;  [facility]: [facility];  Service: Endoscopy;;   RIGHT HEART CATH N/A 05/04/2018   Procedure: RIGHT HEART CATH;  Surgeon: Laurey Morale, MD;  Location[facility] INV[facility] CV LAB;  Service: Cardiovascular;  Laterality: N/A;   RIGHT/LEFT HEART CATH AND CORONARY ANGIOGRAPHY N/A 06/06/2021   Procedure: RIGHT/LEFT HEART CATH AND CORONARY ANGIOGRAPHY;  Surgeon:  [place], Peter M, MD;  Location: [facility] INVASIVE CV LAB;  Service: Cardiovascular;  Laterality: N/A;   STOMACH SURGERY     removed partial stomach   YAG LASER APPLICATION Left 01/14/2016   Procedure: YAG LASER APPLICATION;  Surgeon: Jethro Bolus, MD;  Location: [facility];  Service: Ophthalmology;  Laterality: Left;   YAG LASER APPLICATION Right 01/28/2016   Procedure: YAG LASER APPLICATION;  Surgeon: Jethro Bolus, MD;  Location: [facility];  Service: Ophthalmology;  Laterality: Right;   Family History  Problem Relation Age of Onset   Cancer Sister        breast cancer   Cancer Brother 45       esophageal cancer   Cancer Maternal Aunt        pancreas?   Cancer Paternal Aunt        multiple aunts had cancer   Colon cancer Neg Hx    Social History   Socioeconomic History   Marital status: Married    Spouse name: Not on file   Number of children: Not on file   Years of education: Not on file   Highest education level: Not on file  Occupational History   Not on file  Tobacco Use   Smoking status: Every Day    Current packs/day: 1.00    Average packs/day: 1 pack/day for 59.0 years (59.0 ttl pk-yrs)    Types: Cigarettes   Smokeless tobacco: Never   Tobacco comments:    1/2 ppd 09/22/21   Vaping Use   Vaping status: Never Used  Substance and Sexual Activity   Alcohol use: No   Drug use: No   Sexual activity: Not Currently  Other Topics Concern   Not on file  Social History Narrative   Patient lost her husband in April 2024. She lives alone. She has a chaplain come by once weekly for counseling due to depression since she lost her husband.    Social Determinants of Health   Financial Resource Strain: Low Risk  (12/24/2022)   Overall Financial Resource Strain (CARDIA)    Difficulty of Paying Living Expenses: Not hard at all  Food Insecurity: No Food Insecurity (12/24/2022)   Hunger Vital Sign    Worried About Running Out of Food in the Last Year: Never true    Ran Out of Food in the  Last Year: Never true  Transportation Needs: No Transportation Needs (12/24/2022)   PRAPARE - Administrator, Civil Service (Medical): No    Lack of Transportation (  Non-Medical): No  Physical Activity: Inactive (12/24/2022)   Exercise Vital Sign    Days of Exercise per Week: 0 days    Minutes of Exercise per Session: 0 min  Stress: Stress Concern Present (12/24/2022)   [employer] of Occupational Health - Occupational Stress Questionnaire    Feeling of Stress : Very much  Social Connections: Moderately Isolated (12/24/2022)   Social Connection and Isolation Panel [NHANES]    Frequency of Communication with Friends and Family: More than three times a week    Frequency of Social Gatherings with Friends and Family: More than three times a week    Attends Religious Services: More than 4 times per year    Active Member of [employer] or Organizations: No    Attends Banker Meetings: Never    Marital Status: Widowed    Tobacco Counseling Ready to quit: No Counseling given: Yes Tobacco comments: 1/2 ppd 09/22/21    Clinical Intake:  Pre-visit preparation completed: Yes  Pain : 0-10 Pain Score: 7  Pain Type: Chronic pain Pain Location: Back Pain Orientation: Lower Pain Descriptors / Indicators: Aching, Constant Pain Onset: More than a month ago Pain Frequency: Constant     BMI - recorded: 22.6 Nutritional Status: BMI of 19-24  Normal Nutritional Risks: None Diabetes: No  How often do you need to have someone help you when you read instructions, pamphlets, or other written materials from your doctor or pharmacy?: 1 - Never  Interpreter Needed?: No  Information entered by :: A Lakendra Helling, CMA   Activities of Daily Living    12/24/2022    1:11 PM  In your present state of health, do you have any difficulty performing the following activities:  Hearing? 0  Vision? 0  Difficulty concentrating or making decisions? 0  Walking or climbing stairs? 1   Comment due to chronic leg and back pain  Dressing or bathing? 0  Doing errands, shopping? 1  Comment pt states she can't go shopping because she can't stand up long enough due to chronic leg pain  Preparing Food and eating ? Y  Comment has a hard time due to pain  Using the Toilet? N  In the past six months, have you accidently leaked urine? N  Do you have problems with loss of bowel control? N  Managing your Medications? Y  Comment someone else manages paitents meds  Managing your Finances? N  Housekeeping or managing your Housekeeping? Y  Comment due to chronic pain    Patient Care Team: Billie Lade, MD as PCP - General (Internal Medicine) Pricilla Riffle, MD as PCP - Cardiology (Cardiology) Laurey Morale, MD as PCP - Advanced Heart Failure (Cardiology) Jena Gauss Gerrit Friends, MD as Attending Physician (Gastroenterology)  Indicate any recent Medical Services you may have received from other than [facility] providers in the past year (date may be approximate).     Assessment:   This is a routine wellness examination for [name].  Hearing/Vision screen Hearing Screening - Comments:: Patient denies any hearing difficulties.   Vision Screening - Comments:: Referral placed today   Goals Addressed             This Visit's Progress    Patient Stated       Remain active        Depression Screen    12/24/2022    1:03 PM 09/28/2022    2:56 PM  PHQ 2/9 Scores  PHQ - 2 Score 3 2  PHQ- 9  Score 10 6    Fall Risk    12/24/2022    1:11 PM 09/28/2022    2:56 PM  Fall Risk   Falls in the past year? 1 1  Number falls in past yr: 1 1  Injury with Fall? 0 0  Risk for fall due to : Impaired balance/gait;Impaired mobility;History of fall(s)   Follow up Education provided;Falls prevention discussed     MEDICARE RISK AT HOME: Medicare Risk at Home Any stairs in or around the home?: Yes If so, are there any without handrails?: No Home free of loose throw rugs in walkways,  pet beds, electrical cords, etc?: Yes Adequate lighting in your home to reduce risk of falls?: Yes Life alert?: No Use of a cane, walker or w/c?: Yes Grab bars in the bathroom?: Yes Shower chair or bench in shower?: Yes Elevated toilet seat or a handicapped toilet?: Yes  TIMED UP AND GO:  Was the test performed? No    Cognitive Function:        12/24/2022    1:03 PM  6CIT Screen  What Year? 0 points  What month? 0 points  What time? 0 points  Count back from 20 0 points  Months in reverse 0 points  Repeat phrase 0 points  Total Score 0 points    Immunizations Immunization History  Administered Date(s) Administered   PFIZER(Purple Top)SARS-COV-2 Vaccination 06/01/2019, 06/27/2019   Tdap 11/25/2021    TDAP status: Up to date  Flu Vaccine status: Due, Education has been provided regarding the importance of this vaccine. Advised may receive this vaccine at local pharmacy or Health Dept. Aware to provide a copy of the vaccination record if obtained from local pharmacy or Health Dept. Verbalized acceptance and understanding.  Pneumococcal vaccine status: Due, Education has been provided regarding the importance of this vaccine. Advised may receive this vaccine at local pharmacy or Health Dept. Aware to provide a copy of the vaccination record if obtained from local pharmacy or Health Dept. Verbalized acceptance and understanding.  Covid-19 vaccine status: Information provided on how to obtain vaccines.   Qualifies for Shingles Vaccine? Yes   Zostavax completed No   Shingrix Completed?: No.    Education has been provided regarding the importance of this vaccine. Patient has been advised to call insurance company to determine out of pocket expense if they have not yet received this vaccine. Advised may also receive vaccine at local pharmacy or Health Dept. Verbalized acceptance and understanding.  Screening Tests Health Maintenance  Topic Date Due   Medicare Annual Wellness  (AWV)  Never done   Pneumonia Vaccine 2+ Years old (1 of 2 - PCV) Never done   Hepatitis C Screening  Never done   Zoster Vaccines- Shingrix (1 of 2) Never done   DEXA SCAN  Never done   INFLUENZA VACCINE  11/05/2022   COVID-19 Vaccine (3 - 2023-24 season) 12/06/2022   Lung Cancer Screening  12/01/2023   DTaP/Tdap/Td (2 - Td or Tdap) 11/26/2031   HPV VACCINES  Aged Out   Colonoscopy  Discontinued    Health Maintenance  Health Maintenance Due  Topic Date Due   Medicare Annual Wellness (AWV)  Never done   Pneumonia Vaccine 49+ Years old (1 of 2 - PCV) Never done   Hepatitis C Screening  Never done   Zoster Vaccines- Shingrix (1 of 2) Never done   DEXA SCAN  Never done   INFLUENZA VACCINE  11/05/2022   COVID-19 Vaccine (3 -  2023-24 season) 12/06/2022    Colorectal cancer screening: No longer required.   Mammogram status: Completed 07/08/2022. Repeat every year  Patient declined bone density screening   Lung Cancer Screening: (Low Dose CT Chest recommended if Age 77-80 years, 20 pack-year currently smoking OR have quit w/in 15years.) does qualify.   Patient had screening on 12/01/2022  Additional Screening:  Hepatitis C Screening: does qualify; not completed Vision Screening: Recommended annual ophthalmology exams for early detection of glaucoma and other disorders of the eye. Is the patient up to date with their annual eye exam?  Yes  Who is the provider or what is the name of the office in which the patient attends annual eye exams? Referral placed due to provider retiring If pt is not established with a provider, would they like to be referred to a provider to establish care? Yes .   Dental Screening: Recommended annual dental exams for proper oral hygiene  Diabetic Foot Exam: na  Community Resource Referral / Chronic Care Management: CRR required this visit?  No   CCM required this visit?  No     Plan:     I have personally reviewed and noted the following in  the patient's chart:   Medical and social history Use of alcohol, tobacco or illicit drugs  Current medications and supplements including opioid prescriptions. Patient is currently taking opioid prescriptions. Information provided to patient regarding non-opioid alternatives. Patient advised to discuss non-opioid treatment plan with their provider. Functional ability and status Nutritional status Physical activity Advanced directives List of other physicians Hospitalizations, surgeries, and ER visits in previous 12 months Vitals Screenings to include cognitive, depression, and falls Referrals and appointments  In addition, I have reviewed and discussed with patient certain preventive protocols, quality metrics, and best practice recommendations. A written personalized care plan for preventive services as well as general preventive health recommendations were provided to patient.     Jordan Hawks Rechel Delosreyes, CMA   12/24/2022   After Visit Summary: (Mail) Due to this being a telephonic visit, the after visit summary with patients personalized plan was offered to patient via mail   Nurse Notes:

## 2022-12-24 NOTE — Patient Instructions (Signed)
[name] , Thank you for taking time to come for your Medicare Wellness Visit. I appreciate your ongoing commitment to your health goals. Please review the following plan we discussed and let me know if I can assist you in the future.   Referrals/Orders/Follow-Ups/Clinician Recommendations:  You have been referred to [facility] for an eye exam. If you haven't heard from them in the next 7-10 business days, please call their office to schedule your appointment  [facility] [address] [place], [place] [address] Phone: [phone]  Follow up: March 15, 2024 at 2:20pm virtual visit.   This is a list of the screening recommended for you and due dates:  Health Maintenance  Topic Date Due   Pneumonia Vaccine (1 of 2 - PCV) Never done   Hepatitis C Screening  Never done   Zoster (Shingles) Vaccine (1 of 2) Never done   DEXA scan (bone density measurement)  Never done   Flu Shot  11/05/2022   COVID-19 Vaccine (3 - 2023-24 season) 12/06/2022   Mammogram  07/08/2023   Screening for Lung Cancer  12/01/2023   Medicare Annual Wellness Visit  12/24/2023   DTaP/Tdap/Td vaccine (2 - Td or Tdap) 11/26/2031   HPV Vaccine  Aged Out   Colon Cancer Screening  Discontinued    Advanced directives: (ACP Link)Information on Advanced Care Planning can be found at [facility] Advance Health Care Directives Advance Health Care Directives ([email or web address]   Next Medicare Annual Wellness Visit scheduled for next year: Yes  Preventive Care 65 Years and Older, Female Preventive care refers to lifestyle choices and visits with your health care provider that can promote health and wellness. Preventive care visits are also called wellness exams. What can I expect for my preventive care visit? Counseling Your health care provider may ask you questions about your: Medical history, including: Past medical problems. Family medical history. Pregnancy and menstrual history. History of  falls. Current health, including: Memory and ability to understand (cognition). Emotional well-being. Home life and relationship well-being. Sexual activity and sexual health. Lifestyle, including: Alcohol, nicotine or tobacco, and drug use. Access to firearms. Diet, exercise, and sleep habits. Work and work Astronomer. Sunscreen use. Safety issues such as seatbelt and bike helmet use. Physical exam Your health care provider will check your: Height and weight. These may be used to calculate your BMI (body mass index). BMI is a measurement that tells if you are at a healthy weight. Waist circumference. This measures the distance around your waistline. This measurement also tells if you are at a healthy weight and may help predict your risk of certain diseases, such as type 2 diabetes and high blood pressure. Heart rate and blood pressure. Body temperature. Skin for abnormal spots. What immunizations do I need?  Vaccines are usually given at various ages, according to a schedule. Your health care provider will recommend vaccines for you based on your age, medical history, and lifestyle or other factors, such as travel or where you work. What tests do I need? Screening Your health care provider may recommend screening tests for certain conditions. This may include: Lipid and cholesterol levels. Hepatitis C test. Hepatitis B test. HIV (human immunodeficiency virus) test. STI (sexually transmitted infection) testing, if you are at risk. Lung cancer screening. Colorectal cancer screening. Diabetes screening. This is done by checking your blood sugar (glucose) after you have not eaten for a while (fasting). Mammogram. Talk with your health care provider about  how often you should have regular mammograms. BRCA-related cancer screening. This may be done if you have a family history of breast, ovarian, tubal, or peritoneal cancers. Bone density scan. This is done to screen for  osteoporosis. Talk with your health care provider about your test results, treatment options, and if necessary, the need for more tests. Follow these instructions at home: Eating and drinking  Eat a diet that includes fresh fruits and vegetables, whole grains, lean protein, and low-fat dairy products. Limit your intake of foods with high amounts of sugar, saturated fats, and salt. Take vitamin and mineral supplements as recommended by your health care provider. Do not drink alcohol if your health care provider tells you not to drink. If you drink alcohol: Limit how much you have to 0-1 drink a day. Know how much alcohol is in your drink. In the [place] one drink equals one 12 oz bottle of beer (355 mL), one 5 oz glass of wine (148 mL), or one 1 oz glass of hard liquor (44 mL). Lifestyle Brush your teeth every morning and night with fluoride toothpaste. Floss one time each day. Exercise for at least 30 minutes 5 or more days each week. Do not use any products that contain nicotine or tobacco. These products include cigarettes, chewing tobacco, and vaping devices, such as e-cigarettes. If you need help quitting, ask your health care provider. Do not use drugs. If you are sexually active, practice safe sex. Use a condom or other form of protection in order to prevent STIs. Take aspirin only as told by your health care provider. Make sure that you understand how much to take and what form to take. Work with your health care provider to find out whether it is safe and beneficial for you to take aspirin daily. Ask your health care provider if you need to take a cholesterol-lowering medicine (statin). Find healthy ways to manage stress, such as: Meditation, yoga, or listening to music. Journaling. Talking to a trusted person. Spending time with friends and family. Minimize exposure to UV radiation to reduce your risk of skin cancer. Safety Always wear your seat belt while driving or riding in a  vehicle. Do not drive: If you have been drinking alcohol. Do not ride with someone who has been drinking. When you are tired or distracted. While texting. If you have been using any mind-altering substances or drugs. Wear a helmet and other protective equipment during sports activities. If you have firearms in your house, make sure you follow all gun safety procedures. What's next? Visit your health care provider once a year for an annual wellness visit. Ask your health care provider how often you should have your eyes and teeth checked. Stay up to date on all vaccines. This information is not intended to replace advice given to you by your health care provider. Make sure you discuss any questions you have with your health care provider. Document Revised: 09/18/2020 Document Reviewed: 09/18/2020 Elsevier Patient Education  2024 [employer]. Understanding Your Risk for Falls Millions of people have serious injuries from falls each year. It is important to understand your risk of falling. Talk with your health care provider about your risk and what you can do to lower it. If you do have a serious fall, make sure to tell your provider. Falling once raises your risk of falling again. How can falls affect me? Serious injuries from falls are common. These include: Broken bones, such as hip fractures. Head injuries, such as traumatic  brain injuries (TBI) or concussions. A fear of falling can cause you to avoid activities and stay at home. This can make your muscles weaker and raise your risk for a fall. What can increase my risk? There are a number of risk factors that increase your risk for falling. The more risk factors you have, the higher your risk of falling. Serious injuries from a fall happen most often to people who are older than 78 years old. Teenagers and young adults ages 99-29 are also at higher risk. Common risk factors include: Weakness in the lower body. Being generally weak or  confused due to long-term (chronic) illness. Dizziness or balance problems. Poor vision. Medicines that cause dizziness or drowsiness. These may include: Medicines for your blood pressure, heart, anxiety, insomnia, or swelling (edema). Pain medicines. Muscle relaxants. Other risk factors include: Drinking alcohol. Having had a fall in the past. Having foot pain or wearing improper footwear. Working at a dangerous job. Having any of the following in your home: Tripping hazards, such as floor clutter or loose rugs. Poor lighting. Pets. Having dementia or memory loss. What actions can I take to lower my risk of falling?     Physical activity Stay physically fit. Do strength and balance exercises. Consider taking a regular class to build strength and balance. Yoga and tai chi are good options. Vision Have your eyes checked every year and your prescription for glasses or contacts updated as needed. Shoes and walking aids Wear non-skid shoes. Wear shoes that have rubber soles and low heels. Do not wear high heels. Do not walk around the house in socks or slippers. Use a cane or walker as told by your provider. Home safety Attach secure railings on both sides of your stairs. Install grab bars for your bathtub, shower, and toilet. Use a non-skid mat in your bathtub or shower. Attach bath mats securely with double-sided, non-slip rug tape. Use good lighting in all rooms. Keep a flashlight near your bed. Make sure there is a clear path from your bed to the bathroom. Use night-lights. Do not use throw rugs. Make sure all carpeting is taped or tacked down securely. Remove all clutter from walkways and stairways, including extension cords. Repair uneven or broken steps and floors. Avoid walking on icy or slippery surfaces. Walk on the grass instead of on icy or slick sidewalks. Use ice melter to get rid of ice on walkways in the winter. Use a cordless phone. Questions to ask your health  care provider Can you help me check my risk for a fall? Do any of my medicines make me more likely to fall? Should I take a vitamin D supplement? What exercises can I do to improve my strength and balance? Should I make an appointment to have my vision checked? Do I need a bone density test to check for weak bones (osteoporosis)? Would it help to use a cane or a walker? Where to find more information Centers for Disease Control and Prevention, STEADI: TonerPromos.no Community-Based Fall Prevention Programs: TonerPromos.no [employer] on Aging: BaseRingTones.pl Contact a health care provider if: You fall at home. You are afraid of falling at home. You feel weak, drowsy, or dizzy. This information is not intended to replace advice given to you by your health care provider. Make sure you discuss any questions you have with your health care provider. Document Revised: 11/24/2021 Document Reviewed: 11/24/2021 Elsevier Patient Education  2024 Elsevier Inc. Managing Pain Without Opioids Opioids are strong medicines used to  treat moderate to severe pain. For some people, especially those who have long-term (chronic) pain, opioids may not be the best choice for pain management due to: Side effects like nausea, constipation, and sleepiness. The risk of addiction (opioid use disorder). The longer you take opioids, the greater your risk of addiction. Pain that lasts for more than 3 months is called chronic pain. Managing chronic pain usually requires more than one approach and is often provided by a team of health care providers working together (multidisciplinary approach). Pain management may be done at a pain management center or pain clinic. How to manage pain without the use of opioids Use non-opioid medicines Non-opioid medicines for pain may include: Over-the-counter or prescription non-steroidal anti-inflammatory drugs (NSAIDs). These may be the first medicines used for pain. They work well for muscle  and bone pain, and they reduce swelling. Acetaminophen. This over-the-counter medicine may work well for milder pain but not swelling. Antidepressants. These may be used to treat chronic pain. A certain type of antidepressant (tricyclics) is often used. These medicines are given in lower doses for pain than when used for depression. Anticonvulsants. These are usually used to treat seizures but may also reduce nerve (neuropathic) pain. Muscle relaxants. These relieve pain caused by sudden muscle tightening (spasms). You may also use a pain medicine that is applied to the skin as a patch, cream, or gel (topical analgesic), such as a numbing medicine. These may cause fewer side effects than medicines taken by mouth. Do certain therapies as directed Some therapies can help with pain management. They include: Physical therapy. You will do exercises to gain strength and flexibility. A physical therapist may teach you exercises to move and stretch parts of your body that are weak, stiff, or painful. You can learn these exercises at physical therapy visits and practice them at home. Physical therapy may also involve: Massage. Heat wraps or applying heat or cold to affected areas. Electrical signals that interrupt pain signals (transcutaneous electrical nerve stimulation, TENS). Weak lasers that reduce pain and swelling (low-level laser therapy). Signals from your body that help you learn to regulate pain (biofeedback). Occupational therapy. This helps you to learn ways to function at home and work with less pain. Recreational therapy. This involves trying new activities or hobbies, such as a physical activity or drawing. Mental health therapy, including: Cognitive behavioral therapy (CBT). This helps you learn coping skills for dealing with pain. Acceptance and commitment therapy (ACT) to change the way you think and react to pain. Relaxation therapies, including muscle relaxation exercises and  mindfulness-based stress reduction. Pain management counseling. This may be individual, family, or group counseling.  Receive medical treatments Medical treatments for pain management include: Nerve block injections. These may include a pain blocker and anti-inflammatory medicines. You may have injections: Near the spine to relieve chronic back or neck pain. Into joints to relieve back or joint pain. Into nerve areas that supply a painful area to relieve body pain. Into muscles (trigger point injections) to relieve some painful muscle conditions. A medical device placed near your spine to help block pain signals and relieve nerve pain or chronic back pain (spinal cord stimulation device). Acupuncture. Follow these instructions at home Medicines Take over-the-counter and prescription medicines only as told by your health care provider. If you are taking pain medicine, ask your health care providers about possible side effects to watch out for. Do not drive or use heavy machinery while taking prescription opioid pain medicine. Lifestyle  Do not  use drugs or alcohol to reduce pain. If you drink alcohol, limit how much you have to: 0-1 drink a day for women who are not pregnant. 0-2 drinks a day for men. Know how much alcohol is in a drink. In the [place] one drink equals one 12 oz bottle of beer (355 mL), one 5 oz glass of wine (148 mL), or one 1 oz glass of hard liquor (44 mL). Do not use any products that contain nicotine or tobacco. These products include cigarettes, chewing tobacco, and vaping devices, such as e-cigarettes. If you need help quitting, ask your health care provider. Eat a healthy diet and maintain a healthy weight. Poor diet and excess weight may make pain worse. Eat foods that are high in fiber. These include fresh fruits and vegetables, whole grains, and beans. Limit foods that are high in fat and processed sugars, such as fried and sweet foods. Exercise regularly.  Exercise lowers stress and may help relieve pain. Ask your health care provider what activities and exercises are safe for you. If your health care provider approves, join an exercise class that combines movement and stress reduction. Examples include yoga and tai chi. Get enough sleep. Lack of sleep may make pain worse. Lower stress as much as possible. Practice stress reduction techniques as told by your therapist. General instructions Work with all your pain management providers to find the treatments that work best for you. You are an important member of your pain management team. There are many things you can do to reduce pain on your own. Consider joining an online or in-person support group for people who have chronic pain. Keep all follow-up visits. This is important. Where to find more information You can find more information about managing pain without opioids from: American Academy of Pain Medicine: painmed.org Institute for Chronic Pain: instituteforchronicpain.org American Chronic Pain Association: theacpa.org Contact a health care provider if: You have side effects from pain medicine. Your pain gets worse or does not get better with treatments or home therapy. You are struggling with anxiety or depression. Summary Many types of pain can be managed without opioids. Chronic pain may respond better to pain management without opioids. Pain is best managed when you and a team of health care providers work together. Pain management without opioids may include non-opioid medicines, medical treatments, physical therapy, mental health therapy, and lifestyle changes. Tell your health care providers if your pain gets worse or is not being managed well enough. This information is not intended to replace advice given to you by your health care provider. Make sure you discuss any questions you have with your health care provider. Document Revised: 07/03/2020 Document Reviewed:  07/03/2020 Elsevier Patient Education  2024 Elsevier Inc. Steps to Quit Smoking Smoking tobacco is the leading cause of preventable death. It can affect almost every organ in the body. Smoking puts you and people around you at risk for many serious, long-lasting (chronic) diseases. Quitting smoking can be hard, but it is one of the best things that you can do for your health. It is never too late to quit. Do not give up if you cannot quit the first time. Some people need to try many times to quit. Do your best to stick to your quit plan, and talk with your doctor if you have any questions or concerns. How do I get ready to quit? Pick a date to quit. Set a date within the next 2 weeks to give you time to prepare. Write  down the reasons why you are quitting. Keep this list in places where you will see it often. Tell your family, friends, and co-workers that you are quitting. Their support is important. Talk with your doctor about the choices that may help you quit. Find out if your health insurance will pay for these treatments. Know the people, places, things, and activities that make you want to smoke (triggers). Avoid them. What first steps can I take to quit smoking? Throw away all cigarettes at home, at work, and in your car. Throw away the things that you use when you smoke, such as ashtrays and lighters. Clean your car. Empty the ashtray. Clean your home, including curtains and carpets. What can I do to help me quit smoking? Talk with your doctor about taking medicines and seeing a counselor. You are more likely to succeed when you do both. If you are pregnant or breastfeeding: Talk with your doctor about counseling or other ways to quit smoking. Do not take medicine to help you quit smoking unless your doctor tells you to. Quit right away Quit smoking completely, instead of slowly cutting back on how much you smoke over a period of time. Stopping smoking right away may be more  successful than slowly quitting. Go to counseling. In-person is best if this is an option. You are more likely to quit if you go to counseling sessions regularly. Take medicine You may take medicines to help you quit. Some medicines need a prescription, and some you can buy over-the-counter. Some medicines may contain a drug called nicotine to replace the nicotine in cigarettes. Medicines may: Help you stop having the desire to smoke (cravings). Help to stop the problems that come when you stop smoking (withdrawal symptoms). Your doctor may ask you to use: Nicotine patches, gum, or lozenges. Nicotine inhalers or sprays. Non-nicotine medicine that you take by mouth. Find resources Find resources and other ways to help you quit smoking and remain smoke-free after you quit. They include: Online chats with a Veterinary surgeon. Phone quitlines. Printed Materials engineer. Support groups or group counseling. Text messaging programs. Mobile phone apps. Use apps on your mobile phone or tablet that can help you stick to your quit plan. Examples of free services include Quit Guide from the CDC and smokefree.gov  What can I do to make it easier to quit?  Talk to your family and friends. Ask them to support and encourage you. Call a phone quitline, such as 1-800-QUIT-NOW, reach out to support groups, or work with a Veterinary surgeon. Ask people who smoke to not smoke around you. Avoid places that make you want to smoke, such as: Bars. Parties. Smoke-break areas at work. Spend time with people who do not smoke. Lower the stress in your life. Stress can make you want to smoke. Try these things to lower stress: Getting regular exercise. Doing deep-breathing exercises. Doing yoga. Meditating. What benefits will I see if I quit smoking? Over time, you may have: A better sense of smell and taste. Less coughing and sore throat. A slower heart rate. Lower blood pressure. Clearer skin. Better breathing. Fewer  sick days. Summary Quitting smoking can be hard, but it is one of the best things that you can do for your health. Do not give up if you cannot quit the first time. Some people need to try many times to quit. When you decide to quit smoking, make a plan to help you succeed. Quit smoking right away, not slowly over a period of  time. When you start quitting, get help and support to keep you smoke-free. This information is not intended to replace advice given to you by your health care provider. Make sure you discuss any questions you have with your health care provider. Document Revised: 03/14/2021 Document Reviewed: 03/14/2021 Elsevier Patient Education  2024 [employer].

## 2022-12-29 ENCOUNTER — Ambulatory Visit: Payer: Medicare Other | Admitting: Internal Medicine

## 2022-12-31 ENCOUNTER — Ambulatory Visit (INDEPENDENT_AMBULATORY_CARE_PROVIDER_SITE_OTHER)

## 2022-12-31 ENCOUNTER — Encounter: Payer: Self-pay | Admitting: Internal Medicine

## 2022-12-31 ENCOUNTER — Ambulatory Visit: Admitting: Internal Medicine

## 2022-12-31 VITALS — BP 107/63 | HR 82 | Ht 66.0 in | Wt 159.0 lb

## 2022-12-31 DIAGNOSIS — J9612 Chronic respiratory failure with hypercapnia: Secondary | ICD-10-CM | POA: Diagnosis not present

## 2022-12-31 DIAGNOSIS — F17210 Nicotine dependence, cigarettes, uncomplicated: Secondary | ICD-10-CM

## 2022-12-31 DIAGNOSIS — J4489 Other specified chronic obstructive pulmonary disease: Secondary | ICD-10-CM | POA: Diagnosis not present

## 2022-12-31 DIAGNOSIS — J9611 Chronic respiratory failure with hypoxia: Secondary | ICD-10-CM | POA: Diagnosis not present

## 2022-12-31 DIAGNOSIS — Z23 Encounter for immunization: Secondary | ICD-10-CM

## 2022-12-31 MED ORDER — BREZTRI AEROSPHERE 160-9-4.8 MCG/ACT IN AERO: 2.0000 | INHALATION_SPRAY | Freq: Two times a day (BID) | RESPIRATORY_TRACT | Status: DC

## 2022-12-31 NOTE — Progress Notes (Addendum)
[name], female    DOB: 1946-03-02    MRN: [id number]   Brief patient profile:  66  yobf  active smoker  referred to pulmonary clinic in [place]  07/16/2022 by [name]  for AB / 02 dep onset of symptoms  1990s    Started on 02 x since around 2020 per pt  Inhaler dep since 2023   - PFT's  05/12/2018  FEV1 1.45 (74 % ) ratio 0.72  p 29 % improvement from saba p ? prior to study with DLCO  10.67 (51%)   and FV curve mildly concave     History of Present Illness  07/16/2022  Pulmonary/ 1st office eval/ [name] / [name] Office maint breztri but suboptimal technique  Chief Complaint  Patient presents with   Follow-up    Pt f/u son states that pt finished a course of prednisone on 4/3 & symptoms (SOB, coughing, wheezing) returned in 4/5. She is currently using 3.5L continuous O2 @ home & 4L on POC  Dyspnea:  grocery shopping /walking to car in driveway a challenge   Cough: smoker's rattle  Sleep: bed is flat / 2 pillows  SABA use: none 02: 2 lpm / prn daytime  > lately up to 3.5 but not really titrating effectively  Breathing much better just while on pred and worse p a few days off  Lung cancer screen: had CTa 06/07/21 neg nodules Rec Plan A = Automatic = Always=    Breztri Take 2 puffs first thing in am and then another 2 puffs about 12 hours later.  Work on inhaler technique: Prednisone 10 mg 2 each am until better then 1 daily x 5 days and stop  Plan B = Backup (to supplement plan A, not to replace it) Only use your albuterol inhaler as a rescue medication  Plan C = Crisis (instead of Plan B but only if Plan B stops working) - only use your albuterol nebulizer if you first try Plan B  Pantoprazole (protonix) 40 mg   Take  30-60 min before first meal of the day and Pepcid (famotidine)  20 mg after supper until return to office      08/27/2022  f/u ov/[name] re:AB  maint on Breztri   Chief Complaint  Patient presents with   Follow-up    Pt f/u states that her  breathing is baseline - she uses her oxygen just as needed and per pt she "waits until her levels drop low to put it on"  Dyspnea:  limited by sciatica both  Cough: none  Sleeping: flat bed 2 pillows s resp cc SABA use: combivent 2 x daily  02: 2lpm at hs  Nose bleeds - resolved off RA  Plan A = Automatic = Always=    Breztri Take 2 puffs first thing in am and then another 2 puffs about 12 hours later.  Work on inhaler technique:  Plan B = Backup (to supplement plan A, not to replace it) Only use your albuterol inhaler as a rescue medication  Plan C = Crisis (instead of Plan B but only if Plan B stops working) - only use your albuterol nebulizer if you first try Plan B  Make sure you check your oxygen saturation  AT  your highest level of activity (not after you stop)   to be sure it stays over 90%     12/31/2022  f/u ov/[facility]/[name] re: AB maint on breztri  but very limited ability to  use it  No chief complaint on file.  Dyspnea:  limited by back and leg swelling  Cough: none  Sleeping: 45 degrees  s    resp cc  SABA use:  neb  02: 2lpm  Lung cancer screening: 12/01/22   No obvious day to day or daytime variability or assoc excess/ purulent sputum or mucus plugs or hemoptysis or cp or chest tightness, subjective wheeze or overt sinus or hb symptoms.    Also denies any obvious fluctuation of symptoms with weather or environmental changes or other aggravating or alleviating factors except as outlined above   No unusual exposure hx or h/o childhood pna/ asthma or knowledge of premature birth.  Current Allergies, Complete Past Medical History, Past Surgical History, Family History, and Social History were reviewed in [employer] record.  ROS  The following are not active complaints unless bolded Hoarseness, sore throat, dysphagia, dental problems, itching, sneezing,  nasal congestion or discharge of excess mucus or purulent secretions, ear ache,    fever, chills, sweats, unintended wt loss or wt gain, classically pleuritic or exertional cp,  orthopnea pnd or arm/hand swelling  or leg swelling, presyncope, palpitations, abdominal pain, anorexia, nausea, vomiting, diarrhea  or change in bowel habits or change in bladder habits, change in stools or change in urine, dysuria, hematuria,  rash, arthralgias, visual complaints, headache, numbness, weakness or ataxia or problems with walking or coordination,  change in mood or  memory.        Current Meds  Medication Sig   albuterol (2.5 MG/3ML) 0.083% NEBU 3 mL, albuterol (5 MG/ML) 0.5% NEBU 0.5 mL Inhale into the lungs.   albuterol (PROVENTIL) (2.5 MG/3ML) 0.083% nebulizer solution Take 3 mLs (2.5 mg total) by nebulization every 4 (four) hours as needed for wheezing or shortness of breath.   albuterol (VENTOLIN HFA) 108 (90 Base) MCG/ACT inhaler Inhale 2 puffs into the lungs every 4 (four) hours as needed for wheezing or shortness of breath.   atorvastatin (LIPITOR) 40 MG tablet Take 1 tablet (40 mg total) by mouth daily.   Budeson-Glycopyrrol-Formoterol (BREZTRI AEROSPHERE) 160-9-4.8 MCG/ACT AERO Inhale 2 puffs into the lungs in the morning and at bedtime.   Budeson-Glycopyrrol-Formoterol (BREZTRI AEROSPHERE) 160-9-4.8 MCG/ACT AERO Inhale 2 puffs into the lungs in the morning and at bedtime.   clonazePAM (KLONOPIN) 1 MG tablet Take 0.5-1 mg by mouth 4 (four) times daily as needed for anxiety.   escitalopram (LEXAPRO) 10 MG tablet Take 10 mg by mouth at bedtime.   famotidine (PEPCID) 20 MG tablet One after supper   ferrous sulfate 325 (65 FE) MG tablet Take 325 mg by mouth 3 (three) times a week.   gabapentin (NEURONTIN) 300 MG capsule Take 300 mg by mouth 3 (three) times daily.   HYDROcodone-acetaminophen (NORCO) 10-325 MG tablet Take 1 tablet by mouth every 4 (four) hours as needed (severe back pain.).   nitroGLYCERIN (NITROSTAT) 0.4 MG SL tablet Place 1 tablet (0.4 mg total) under the tongue every  5 (five) minutes as needed for chest pain.   OXYGEN Inhale 2 L into the lungs.   pantoprazole (PROTONIX) 40 MG tablet Take 30-60 min before first meal of the day   potassium chloride (KLOR-CON) 10 MEQ tablet Take 1 tablet (10 mEq total) by mouth every Monday, Wednesday, and Friday. Take 1 tab when you take the furosemide/Lasix tablet   promethazine (PHENERGAN) 12.5 MG tablet Take 1 tablet (12.5 mg total) by mouth every 6 (six) hours as needed for  nausea or vomiting.             Past Medical History:  Diagnosis Date   Arthritis    Chronic back pain    Chronic heart failure with preserved ejection fraction (HFpEF)    a. 06/2021 Echo: EF 60-65%, no rwma, GrI DD, mod-sev reduced RV fxn.   COPD (chronic obstructive pulmonary disease)    patient is on oxygen at night when needed   Depression    GERD (gastroesophageal reflux disease)    Hypertension    Neuropathy    Nonobstructive CAD (coronary artery disease)    a. 06/2021 NSTEMI/Cath: LM nl, LAD 25d, LCX nl, RCA 30p.   PAH (pulmonary artery hypertension)    a. 04/2018 PA 55/21 (36); b. 06/2021 Echo: EF 60-65%, no rwma, GrI DD, mod-sev reduced RV fxn, mod BAE, triv MR, mod TR; c. 06/2021 Cath: PA mean 35, PVR 7 WU.   Thyroid disease    Tobacco abuse    Ulcer    [place] I        Objective:      12/31/2022        159   08/27/22 152 lb 6.4 oz (69.1 kg)  08/13/22 148 lb (67.1 kg)  07/16/22 148 lb 9.6 oz (67.4 kg)    Vital signs reviewed  12/31/2022  - Note at rest 02 sats  75 % on RA on arrival    General appearance:    elderly amb frail bf nad       HEENT : Oropharynx  clear/ edentulous       NECK :  without  apparent JVD/ palpable Nodes/TM    LUNGS: no acc muscle use,  Mild barrel  contour chest wall with bilateral  Distant bs s audible wheeze and  without cough on insp or exp maneuvers  and mild  Hyperresonant  to  percussion bilaterally     CV:  RRR  no s3 or murmur or increase in P2, and 1-2+ pitting both LEs  ABD:   soft and nontender with pos end  insp Hoover's  in the supine position.  No bruits or organomegaly appreciated   MS:  Nl gait/ ext warm without deformities Or obvious joint restrictions  calf tenderness, cyanosis or clubbing     SKIN: warm and dry without lesions    NEURO:  alert, approp, nl sensorium with  no motor or cerebellar deficits apparent.        I personally reviewed images and agree with radiology impression as follows:   Chest LDSCT        82724 1. Lung-RADS 1S, negative. Continue annual screening with low-dose chest CT without contrast in 12 months. 2. The "S" modifier above refers to potentially clinically significant non lung cancer related findings. Specifically, there is aortic atherosclerosis, in addition to left main and three-vessel coronary artery disease. Assessment for potential risk factor modification, dietary therapy or pharmacologic therapy may be warranted, if clinically indicated. 3. Mild diffuse bronchial wall thickening with very mild centrilobular and paraseptal emphysema; imaging findings suggestive of underlying COPD. 4. Trace bilateral pleural effusions are of uncertain etiology and significance, but appear chronic and similar to prior study from 06/07/2021. 5. Mild cardiomegaly.    Assessment

## 2022-12-31 NOTE — Patient Instructions (Addendum)
Plan A = Automatic = Always=    Breztri Take 2 puffs first thing in am and then another 2 puffs about 12 hours later.    Work on inhaler technique:  relax and gently blow all the way out then take a nice smooth full deep breath back in, triggering the inhaler at same time you start breathing in.  Hold breath in for at least  5 seconds if you can. Blow out [employer]  thru nose. Rinse and gargle with water when done.  If mouth or throat bother you at all,  try brushing teeth/gums/tongue with arm and hammer toothpaste/ make a slurry and gargle and spit out.   >>>  Remember how golfers warm up by taking practice swings - do this with an empty inhaler   Plan B = Backup (to supplement plan A, not to replace it) Only use your albuterol inhaler as a rescue medication to be used if you can't catch your breath by resting or doing a relaxed purse lip breathing pattern.  - The less you use it, the better it will work when you need it. - Ok to use the inhaler up to 2 puffs  every 4 hours if you must but call for appointment if use goes up over your usual need - Don't leave home without it !!  (think of it like the spare tire for your car)   Plan C = Crisis (instead of Plan B but only if Plan B stops working) - only use your albuterol nebulizer if you first try Plan B and it fails to help > ok to use the nebulizer up to every 4 hours but if start needing it regularly call for immediate appointment   Make sure you check your oxygen saturation  AT  your highest level of activity (not after you stop)   to be sure it stays over 90% and adjust  02 flow upward to maintain this level if needed but remember to turn it back to previous settings when you stop (to conserve your supply).   Please schedule a follow up visit in 3 months but call sooner if needed  with all medications /inhalers/ solutions in hand so we can verify exactly what you are taking. This includes all medications from all doctors and over the counters

## 2023-01-01 NOTE — Assessment & Plan Note (Addendum)
Active smoker - PFT's  05/12/18  FEV1 1.45 (74 % ) ratio 0.72  p 29 % improvement from saba p ? prior to study with DLCO  10.67 (51%)   and FV curve mildly concave  - 07/16/2022  After extensive coaching inhaler device,  effectiveness =    60% from a baseline of 30% > continue breztry with pred as backup = 20 mg until better, 10 mg x 5 days and try off  - 12/31/2022  After extensive coaching inhaler device,  effectiveness =    50% from a baseline near 0 and having trouble affording breztri so offered bud/duoneb but pt declined / under hospice care now so f/u q3 m optional   She really has more AB than copd and is more limited by geriactric decline/ joint pain than sob s flares of aecopd so reasonable to continue breztri with son's help but very low threshold for option to change to duoneb/budesonide 0.25 mg bid (option to increase to qid if worsening)   Options discussed with son at length.  F/u in 3 m (unless hospice has assumed all her care by then) with all meds in hand using a trust but verify approach to confirm accurate Medication  Reconciliation The principal here is that until we are certain that the  patients are doing what we've asked, it makes no sense to ask them to do more.

## 2023-01-01 NOTE — Assessment & Plan Note (Signed)
Counseled re importance of smoking cessation but did not meet time criteria for separate billing            Each maintenance medication was reviewed in detail including emphasizing most importantly the difference between maintenance and prns and under what circumstances the prns are to be triggered using an action plan format where appropriate.  Total time for H and P, chart review, counseling, reviewing hfa device(s) and generating customized AVS unique to this office visit / same day charting  > 30 min       

## 2023-01-01 NOTE — Assessment & Plan Note (Addendum)
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

## 2023-01-14 ENCOUNTER — Emergency Department (HOSPITAL_COMMUNITY)

## 2023-01-14 ENCOUNTER — Other Ambulatory Visit: Payer: Self-pay

## 2023-01-14 ENCOUNTER — Emergency Department (HOSPITAL_COMMUNITY)
Admission: EM | Admit: 2023-01-14 | Discharge: 2023-01-14 | Disposition: A | Discharge status: Home / Self Care | Attending: Student | Admitting: Student

## 2023-01-14 DIAGNOSIS — R0902 Hypoxemia: Secondary | ICD-10-CM | POA: Diagnosis present

## 2023-01-14 DIAGNOSIS — I2510 Atherosclerotic heart disease of native coronary artery without angina pectoris: Secondary | ICD-10-CM | POA: Insufficient documentation

## 2023-01-14 DIAGNOSIS — Y92410 Unspecified street and highway as the place of occurrence of the external cause: Secondary | ICD-10-CM | POA: Diagnosis not present

## 2023-01-14 DIAGNOSIS — Z7951 Long term (current) use of inhaled steroids: Secondary | ICD-10-CM | POA: Insufficient documentation

## 2023-01-14 DIAGNOSIS — F17210 Nicotine dependence, cigarettes, uncomplicated: Secondary | ICD-10-CM | POA: Diagnosis not present

## 2023-01-14 DIAGNOSIS — Z955 Presence of coronary angioplasty implant and graft: Secondary | ICD-10-CM | POA: Diagnosis not present

## 2023-01-14 DIAGNOSIS — I509 Heart failure, unspecified: Secondary | ICD-10-CM | POA: Diagnosis not present

## 2023-01-14 DIAGNOSIS — J449 Chronic obstructive pulmonary disease, unspecified: Secondary | ICD-10-CM | POA: Insufficient documentation

## 2023-01-14 DIAGNOSIS — I110 Hypertensive heart disease with heart failure: Secondary | ICD-10-CM | POA: Insufficient documentation
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
## 2023-01-14 NOTE — ED Notes (Signed)
Patient stated " I occasionally use Oxygen at home." Patient placed on 3L oxygen via nasal cannula. For patient SpO2 of 86% on arrival. SpO2 increased to 99% after nasal canula.
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
## 2023-01-22 ENCOUNTER — Encounter (HOSPITAL_COMMUNITY): Payer: Self-pay | Admitting: *Deleted

## 2023-01-22 ENCOUNTER — Emergency Department (HOSPITAL_COMMUNITY): Payer: Medicare Other

## 2023-01-22 ENCOUNTER — Inpatient Hospital Stay (HOSPITAL_COMMUNITY)
Admission: EM | Admit: 2023-01-22 | Discharge: 2023-01-27 | DRG: 643 | Disposition: A | Discharge status: Home Health | Payer: Medicare Other | Attending: Family Medicine | Admitting: Family Medicine

## 2023-01-22 ENCOUNTER — Other Ambulatory Visit: Payer: Self-pay

## 2023-01-22 DIAGNOSIS — J441 Chronic obstructive pulmonary disease with (acute) exacerbation: Secondary | ICD-10-CM | POA: Diagnosis present

## 2023-01-22 DIAGNOSIS — Z885 Allergy status to narcotic agent status: Secondary | ICD-10-CM

## 2023-01-22 DIAGNOSIS — R946 Abnormal results of thyroid function studies: Secondary | ICD-10-CM | POA: Diagnosis not present

## 2023-01-22 DIAGNOSIS — F419 Anxiety disorder, unspecified: Secondary | ICD-10-CM | POA: Diagnosis present

## 2023-01-22 DIAGNOSIS — W19XXXA Unspecified fall, initial encounter: Principal | ICD-10-CM

## 2023-01-22 DIAGNOSIS — T43225A Adverse effect of selective serotonin reuptake inhibitors, initial encounter: Secondary | ICD-10-CM | POA: Diagnosis present

## 2023-01-22 DIAGNOSIS — E785 Hyperlipidemia, unspecified: Secondary | ICD-10-CM | POA: Diagnosis present

## 2023-01-22 DIAGNOSIS — J9612 Chronic respiratory failure with hypercapnia: Secondary | ICD-10-CM | POA: Diagnosis not present

## 2023-01-22 DIAGNOSIS — I110 Hypertensive heart disease with heart failure: Secondary | ICD-10-CM | POA: Diagnosis not present

## 2023-01-22 DIAGNOSIS — I252 Old myocardial infarction: Secondary | ICD-10-CM | POA: Diagnosis not present

## 2023-01-22 DIAGNOSIS — I5082 Biventricular heart failure: Secondary | ICD-10-CM | POA: Diagnosis present

## 2023-01-22 DIAGNOSIS — I272 Pulmonary hypertension, unspecified: Secondary | ICD-10-CM | POA: Diagnosis not present

## 2023-01-22 DIAGNOSIS — E038 Other specified hypothyroidism: Secondary | ICD-10-CM | POA: Diagnosis not present

## 2023-01-22 DIAGNOSIS — E0781 Sick-euthyroid syndrome: Secondary | ICD-10-CM | POA: Diagnosis present

## 2023-01-22 DIAGNOSIS — Z888 Allergy status to other drugs, medicaments and biological substances status: Secondary | ICD-10-CM

## 2023-01-22 DIAGNOSIS — Z7951 Long term (current) use of inhaled steroids: Secondary | ICD-10-CM

## 2023-01-22 DIAGNOSIS — Z9981 Dependence on supplemental oxygen: Secondary | ICD-10-CM | POA: Diagnosis not present

## 2023-01-22 DIAGNOSIS — I2722 Pulmonary hypertension due to left heart disease: Secondary | ICD-10-CM | POA: Diagnosis not present

## 2023-01-22 DIAGNOSIS — G9341 Metabolic encephalopathy: Secondary | ICD-10-CM | POA: Diagnosis not present

## 2023-01-22 DIAGNOSIS — E871 Hypo-osmolality and hyponatremia: Secondary | ICD-10-CM | POA: Diagnosis present

## 2023-01-22 DIAGNOSIS — F32A Depression, unspecified: Secondary | ICD-10-CM | POA: Diagnosis present

## 2023-01-22 DIAGNOSIS — E222 Syndrome of inappropriate secretion of antidiuretic hormone: Secondary | ICD-10-CM | POA: Diagnosis not present

## 2023-01-22 DIAGNOSIS — J9611 Chronic respiratory failure with hypoxia: Secondary | ICD-10-CM | POA: Diagnosis present

## 2023-01-22 DIAGNOSIS — G8929 Other chronic pain: Secondary | ICD-10-CM | POA: Diagnosis present

## 2023-01-22 DIAGNOSIS — R531 Weakness: Secondary | ICD-10-CM | POA: Diagnosis present

## 2023-01-22 DIAGNOSIS — Z803 Family history of malignant neoplasm of breast: Secondary | ICD-10-CM

## 2023-01-22 DIAGNOSIS — F17210 Nicotine dependence, cigarettes, uncomplicated: Secondary | ICD-10-CM | POA: Diagnosis present

## 2023-01-22 DIAGNOSIS — E876 Hypokalemia: Secondary | ICD-10-CM | POA: Diagnosis present

## 2023-01-22 DIAGNOSIS — Z79899 Other long term (current) drug therapy: Secondary | ICD-10-CM

## 2023-01-22 DIAGNOSIS — I5032 Chronic diastolic (congestive) heart failure: Secondary | ICD-10-CM | POA: Diagnosis present

## 2023-01-22 DIAGNOSIS — I2723 Pulmonary hypertension due to lung diseases and hypoxia: Secondary | ICD-10-CM | POA: Diagnosis not present

## 2023-01-22 DIAGNOSIS — E878 Other disorders of electrolyte and fluid balance, not elsewhere classified: Secondary | ICD-10-CM | POA: Diagnosis not present

## 2023-01-22 DIAGNOSIS — Z860101 Personal history of adenomatous and serrated colon polyps: Secondary | ICD-10-CM

## 2023-01-22 DIAGNOSIS — I2510 Atherosclerotic heart disease of native coronary artery without angina pectoris: Secondary | ICD-10-CM | POA: Diagnosis present

## 2023-01-22 DIAGNOSIS — K219 Gastro-esophageal reflux disease without esophagitis: Secondary | ICD-10-CM | POA: Diagnosis present

## 2023-01-22 DIAGNOSIS — Z9049 Acquired absence of other specified parts of digestive tract: Secondary | ICD-10-CM

## 2023-01-22 DIAGNOSIS — G928 Other toxic encephalopathy: Secondary | ICD-10-CM | POA: Diagnosis not present

## 2023-01-22 DIAGNOSIS — Z800 Family history of malignant neoplasm of digestive organs: Secondary | ICD-10-CM

## 2023-01-22 DIAGNOSIS — Z90710 Acquired absence of both cervix and uterus: Secondary | ICD-10-CM

## 2023-01-22 LAB — I-STAT CHEM 8, ED
BUN: 38 mg/dL — ABNORMAL HIGH (ref 8–23)
Calcium, Ion: 0.83 mmol/L — CL (ref 1.15–1.40)
Chloride: 66 mmol/L — ABNORMAL LOW (ref 98–111)
Creatinine, Ser: 0.8 mg/dL (ref 0.44–1.00)
Glucose, Bld: 134 mg/dL — ABNORMAL HIGH (ref 70–99)
HCT: 61 % — ABNORMAL HIGH (ref 36.0–46.0)
Hemoglobin: 20.7 g/dL — ABNORMAL HIGH (ref 12.0–15.0)
Potassium: 3.1 mmol/L — ABNORMAL LOW (ref 3.5–5.1)
Sodium: 107 mmol/L — CL (ref 135–145)
TCO2: 34 mmol/L — ABNORMAL HIGH (ref 22–32)

## 2023-01-22 LAB — CBC WITH DIFFERENTIAL/PLATELET
Abs Immature Granulocytes: 0 10*3/uL (ref 0.00–0.07)
Basophils Absolute: 0 10*3/uL (ref 0.0–0.1)
Basophils Relative: 0 %
Eosinophils Absolute: 0 10*3/uL (ref 0.0–0.5)
Eosinophils Relative: 0 %
HCT: 51.5 % — ABNORMAL HIGH (ref 36.0–46.0)
Hemoglobin: 16.1 g/dL — ABNORMAL HIGH (ref 12.0–15.0)
Lymphocytes Relative: 1 %
Lymphs Abs: 0.1 10*3/uL — ABNORMAL LOW (ref 0.7–4.0)
MCH: 22.4 pg — ABNORMAL LOW (ref 26.0–34.0)
MCHC: 31.3 g/dL (ref 30.0–36.0)
MCV: 71.6 fL — ABNORMAL LOW (ref 80.0–100.0)
Monocytes Absolute: 0.2 10*3/uL (ref 0.1–1.0)
Monocytes Relative: 2 %
Neutro Abs: 8.1 10*3/uL — ABNORMAL HIGH (ref 1.7–7.7)
Neutrophils Relative %: 97 %
Platelets: 166 10*3/uL (ref 150–400)
RBC: 7.19 MIL/uL — ABNORMAL HIGH (ref 3.87–5.11)
RDW: 17.5 % — ABNORMAL HIGH (ref 11.5–15.5)
WBC: 8.3 10*3/uL (ref 4.0–10.5)
nRBC: 0 % (ref 0.0–0.2)

## 2023-01-22 LAB — URINALYSIS, ROUTINE W REFLEX MICROSCOPIC
Bacteria, UA: NONE SEEN
Bilirubin Urine: NEGATIVE
Glucose, UA: NEGATIVE mg/dL
Ketones, ur: 5 mg/dL — AB
Nitrite: NEGATIVE
Protein, ur: 100 mg/dL — AB
Specific Gravity, Urine: 1.016 (ref 1.005–1.030)
pH: 6 (ref 5.0–8.0)

## 2023-01-22 LAB — COMPREHENSIVE METABOLIC PANEL
ALT: 23 U/L (ref 0–44)
AST: 44 U/L — ABNORMAL HIGH (ref 15–41)
Albumin: 4.2 g/dL (ref 3.5–5.0)
Alkaline Phosphatase: 129 U/L — ABNORMAL HIGH (ref 38–126)
BUN: 27 mg/dL — ABNORMAL HIGH (ref 8–23)
CO2: 30 mmol/L (ref 22–32)
Calcium: 9.5 mg/dL (ref 8.9–10.3)
Chloride: <65 mmol/L — CL (ref 98–111)
Creatinine, Ser: 0.79 mg/dL (ref 0.44–1.00)
GFR, Estimated: >60 mL/min (ref >60)
Glucose, Bld: 115 mg/dL — ABNORMAL HIGH (ref 70–99)
Potassium: 2.6 mmol/L — CL (ref 3.5–5.1)
Sodium: 111 mmol/L — CL (ref 135–145)
Total Bilirubin: 2.1 mg/dL — ABNORMAL HIGH (ref 0.3–1.2)
Total Protein: 7.4 g/dL (ref 6.5–8.1)

## 2023-01-22 LAB — T4, FREE: Free T4: 1.83 ng/dL — ABNORMAL HIGH (ref 0.61–1.12)

## 2023-01-22 LAB — CBC
HCT: 49.7 % — ABNORMAL HIGH (ref 36.0–46.0)
Hemoglobin: 15.8 g/dL — ABNORMAL HIGH (ref 12.0–15.0)
MCH: 22.3 pg — ABNORMAL LOW (ref 26.0–34.0)
MCHC: 31.8 g/dL (ref 30.0–36.0)
MCV: 70 fL — ABNORMAL LOW (ref 80.0–100.0)
Platelets: 171 10*3/uL (ref 150–400)
RBC: 7.1 MIL/uL — ABNORMAL HIGH (ref 3.87–5.11)
RDW: 17.6 % — ABNORMAL HIGH (ref 11.5–15.5)
WBC: 6.3 10*3/uL (ref 4.0–10.5)
nRBC: 0 % (ref 0.0–0.2)

## 2023-01-22 LAB — OSMOLALITY: Osmolality: 242 mosm/kg — CL (ref 275–295)

## 2023-01-22 LAB — SODIUM
Sodium: 110 mmol/L — CL (ref 135–145)
Sodium: 110 mmol/L — CL (ref 135–145)

## 2023-01-22 LAB — MRSA NEXT GEN BY PCR, NASAL: MRSA by PCR Next Gen: NOT DETECTED

## 2023-01-22 LAB — BASIC METABOLIC PANEL
BUN: 31 mg/dL — ABNORMAL HIGH (ref 8–23)
CO2: 29 mmol/L (ref 22–32)
Calcium: 8.8 mg/dL — ABNORMAL LOW (ref 8.9–10.3)
Chloride: <65 mmol/L — CL (ref 98–111)
Creatinine, Ser: 0.79 mg/dL (ref 0.44–1.00)
GFR, Estimated: >60 mL/min (ref >60)
Glucose, Bld: 131 mg/dL — ABNORMAL HIGH (ref 70–99)
Potassium: 2.5 mmol/L — CL (ref 3.5–5.1)
Sodium: 106 mmol/L — CL (ref 135–145)

## 2023-01-22 LAB — TSH: TSH: 0.13 u[IU]/mL — ABNORMAL LOW (ref 0.350–4.500)

## 2023-01-22 LAB — BRAIN NATRIURETIC PEPTIDE: B Natriuretic Peptide: 98 pg/mL (ref 0.0–100.0)

## 2023-01-22 LAB — GLUCOSE, CAPILLARY: Glucose-Capillary: 129 mg/dL — ABNORMAL HIGH (ref 70–99)

## 2023-01-22 MED ORDER — HEPARIN SODIUM (PORCINE) 5000 UNIT/ML IJ SOLN
5000.0000 [IU] | Freq: Three times a day (TID) | INTRAMUSCULAR | Status: DC
  Administered 2023-01-22 – 2023-01-27 (×13): 5000 [IU] via SUBCUTANEOUS
  Filled 2023-01-22 (×14): qty 1

## 2023-01-22 MED ORDER — ATORVASTATIN CALCIUM 40 MG PO TABS
40.0000 mg | ORAL_TABLET | Freq: Every day | ORAL | Status: DC
  Administered 2023-01-23 – 2023-01-27 (×5): 40 mg via ORAL
  Filled 2023-01-22 (×6): qty 1

## 2023-01-22 MED ORDER — ACETAMINOPHEN 325 MG PO TABS
650.0000 mg | ORAL_TABLET | Freq: Four times a day (QID) | ORAL | Status: DC | PRN
  Administered 2023-01-22 – 2023-01-26 (×4): 650 mg via ORAL
  Filled 2023-01-22 (×4): qty 2

## 2023-01-22 MED ORDER — CHLORHEXIDINE GLUCONATE CLOTH 2 % EX PADS
6.0000 | MEDICATED_PAD | Freq: Every day | CUTANEOUS | Status: DC
  Administered 2023-01-23 – 2023-01-27 (×5): 6 via TOPICAL

## 2023-01-22 MED ORDER — PANTOPRAZOLE SODIUM 40 MG PO TBEC
40.0000 mg | DELAYED_RELEASE_TABLET | Freq: Every day | ORAL | Status: DC
  Administered 2023-01-23 – 2023-01-27 (×5): 40 mg via ORAL
  Filled 2023-01-22 (×5): qty 1

## 2023-01-22 MED ORDER — POLYETHYLENE GLYCOL 3350 17 G PO PACK: 17.0000 g | PACK | Freq: Every day | ORAL | Status: DC | PRN

## 2023-01-22 MED ORDER — ORAL CARE MOUTH RINSE: 15.0000 mL | OROMUCOSAL | Status: DC | PRN

## 2023-01-22 MED ORDER — POTASSIUM CHLORIDE 10 MEQ/100ML IV SOLN
10.0000 meq | Freq: Once | INTRAVENOUS | Status: AC
  Administered 2023-01-22: 10 meq via INTRAVENOUS

## 2023-01-22 MED ORDER — POTASSIUM CHLORIDE 10 MEQ/100ML IV SOLN
10.0000 meq | Freq: Once | INTRAVENOUS | Status: DC
  Filled 2023-01-22: qty 100

## 2023-01-22 MED ORDER — DOCUSATE SODIUM 100 MG PO CAPS: 100.0000 mg | ORAL_CAPSULE | Freq: Two times a day (BID) | ORAL | Status: DC | PRN

## 2023-01-22 MED ORDER — BUDESONIDE 0.5 MG/2ML IN SUSP
0.5000 mg | Freq: Two times a day (BID) | RESPIRATORY_TRACT | Status: DC
  Administered 2023-01-22 – 2023-01-24 (×5): 0.5 mg via RESPIRATORY_TRACT
  Filled 2023-01-22 (×6): qty 2

## 2023-01-22 MED ORDER — SODIUM CHLORIDE 0.9 % IV SOLN: INTRAVENOUS | Status: DC

## 2023-01-22 MED ORDER — ARFORMOTEROL TARTRATE 15 MCG/2ML IN NEBU
15.0000 ug | INHALATION_SOLUTION | Freq: Two times a day (BID) | RESPIRATORY_TRACT | Status: DC
  Administered 2023-01-22 – 2023-01-24 (×4): 15 ug via RESPIRATORY_TRACT
  Filled 2023-01-22 (×6): qty 2

## 2023-01-22 MED ORDER — IPRATROPIUM-ALBUTEROL 0.5-2.5 (3) MG/3ML IN SOLN: 3.0000 mL | RESPIRATORY_TRACT | Status: DC | PRN

## 2023-01-22 MED ORDER — MAGNESIUM SULFATE 2 GM/50ML IV SOLN
2.0000 g | INTRAVENOUS | Status: AC
  Administered 2023-01-22: 2 g via INTRAVENOUS
  Filled 2023-01-22: qty 50

## 2023-01-22 MED ORDER — GABAPENTIN 300 MG PO CAPS
300.0000 mg | ORAL_CAPSULE | Freq: Three times a day (TID) | ORAL | Status: DC
  Administered 2023-01-23 – 2023-01-27 (×13): 300 mg via ORAL
  Filled 2023-01-22 (×14): qty 1

## 2023-01-22 MED ORDER — REVEFENACIN 175 MCG/3ML IN SOLN
175.0000 ug | Freq: Every day | RESPIRATORY_TRACT | Status: DC
  Administered 2023-01-23 – 2023-01-24 (×2): 175 ug via RESPIRATORY_TRACT
  Filled 2023-01-22 (×4): qty 3

## 2023-01-22 MED ORDER — FAMOTIDINE 20 MG PO TABS: 20.0000 mg | ORAL_TABLET | Freq: Two times a day (BID) | ORAL | Status: DC

## 2023-01-22 MED ORDER — IPRATROPIUM-ALBUTEROL 0.5-2.5 (3) MG/3ML IN SOLN
3.0000 mL | Freq: Once | RESPIRATORY_TRACT | Status: AC
  Administered 2023-01-22: 3 mL via RESPIRATORY_TRACT
  Filled 2023-01-22: qty 3

## 2023-01-22 MED ORDER — POTASSIUM CHLORIDE 10 MEQ/100ML IV SOLN
10.0000 meq | INTRAVENOUS | Status: AC
  Administered 2023-01-22 – 2023-01-23 (×4): 10 meq via INTRAVENOUS
  Filled 2023-01-22 (×4): qty 100

## 2023-01-22 MED ORDER — SODIUM CHLORIDE 0.9 % IV SOLN: INTRAVENOUS | Status: DC | PRN

## 2023-01-22 NOTE — ED Provider Notes (Addendum)
[facility] AT [facility] Provider Note   CSN: [id number] Arrival date & time: 01/22/23  0730     History  Chief Complaint  Patient presents with   [name] VONTELLA [name] is a 77 y.o. female.  Patient called EMS for several falls in the past few days.  And also for shortness of breath.  Patient does have oxygen available at home that she uses as needed.  EMS apparently gave her a nebulizer treatment of albuterol and it is also reported that they gave Solu-Medrol IV.  Patient still with some wheezing.  But oxygen sats on 2 L of oxygen are in the 90% range.  Patient states that her legs have felt weak for the past few days and she just falls despite trying to use her walker she has to hold onto things to get around.  She states that that started a couple days ago.  Patient's past medical history for hypertension chronic back pain COPD chronic heart failure pulmonary artery hypertension nonobstructive coronary artery disease history of tobacco abuse.  Past surgical history significant for gallbladder removal and abdominal hysterectomy.  Patient had a heart catheterization in March 2023.  Patient is an everyday smoker.       Home Medications Prior to Admission medications   Medication Sig Start Date End Date Taking? Authorizing Provider  albuterol (2.5 MG/3ML) 0.083% NEBU 3 mL, albuterol (5 MG/ML) 0.5% NEBU 0.5 mL Inhale into the lungs.    [provider]  albuterol (PROVENTIL) (2.5 MG/3ML) 0.083% nebulizer solution Take 3 mLs (2.5 mg total) by nebulization every 4 (four) hours as needed for wheezing or shortness of breath. 07/16/22   Nyoka Cowden, MD  albuterol (VENTOLIN HFA) 108 (90 Base) MCG/ACT inhaler Inhale 2 puffs into the lungs every 4 (four) hours as needed for wheezing or shortness of breath. 07/16/22   Nyoka Cowden, MD  atorvastatin (LIPITOR) 40 MG tablet Take 1 tablet (40 mg total) by mouth daily. 06/08/21   Barrett, Joline Salt,  PA-C  Budeson-Glycopyrrol-Formoterol (BREZTRI AEROSPHERE) 160-9-4.8 MCG/ACT AERO Inhale 2 puffs into the lungs in the morning and at bedtime. 07/03/22   Cobb, Ruby Cola, NP  Budeson-Glycopyrrol-Formoterol (BREZTRI AEROSPHERE) 160-9-4.8 MCG/ACT AERO Inhale 2 puffs into the lungs in the morning and at bedtime. 08/27/22   Nyoka Cowden, MD  Budeson-Glycopyrrol-Formoterol (BREZTRI AEROSPHERE) 160-9-4.8 MCG/ACT AERO Inhale 2 puffs into the lungs in the morning and at bedtime. 12/31/22   Nyoka Cowden, MD  clonazePAM (KLONOPIN) 1 MG tablet Take 0.5-1 mg by mouth 4 (four) times daily as needed for anxiety.    [provider]  escitalopram (LEXAPRO) 10 MG tablet Take 10 mg by mouth at bedtime. 07/15/18   [provider]  famotidine (PEPCID) 20 MG tablet One after supper 07/16/22   Nyoka Cowden, MD  ferrous sulfate 325 (65 FE) MG tablet Take 325 mg by mouth 3 (three) times a week.    [provider]  furosemide (LASIX) 40 MG tablet Take 1 tablet (40 mg total) by mouth 3 (three) times a week. 08/14/22 12/24/22  Strader, Lennart Pall, PA-C  gabapentin (NEURONTIN) 300 MG capsule Take 300 mg by mouth 3 (three) times daily.    [provider]  HYDROcodone-acetaminophen (NORCO) 10-325 MG tablet Take 1 tablet by mouth every 4 (four) hours as needed (severe back pain.). 01/22/17   [provider]  nitroGLYCERIN (NITROSTAT) 0.4 MG SL tablet Place 1 tablet (0.4 mg  total) under the tongue every 5 (five) minutes as needed for chest pain. 06/07/21   Barrett, Joline Salt, PA-C  OXYGEN Inhale 2 L into the lungs.    [provider]  pantoprazole (PROTONIX) 40 MG tablet Take 30-60 min before first meal of the day 07/16/22   Nyoka Cowden, MD  potassium chloride (KLOR-CON) 10 MEQ tablet Take 1 tablet (10 mEq total) by mouth every Monday, Wednesday, and Friday. Take 1 tab when you take the furosemide/Lasix tablet 10/13/21   Pricilla Riffle, MD  promethazine (PHENERGAN) 12.5 MG  tablet Take 1 tablet (12.5 mg total) by mouth every 6 (six) hours as needed for nausea or vomiting. 09/28/22   Billie Lade, MD      Allergies    Chantix [varenicline], Codeine, Opsumit [macitentan], Tadalafil, Tyvaso [treprostinil], and Oxycodone    Review of Systems   Review of Systems  Constitutional:  Negative for chills and fever.  HENT:  Negative for ear pain and sore throat.   Eyes:  Negative for pain and visual disturbance.  Respiratory:  Positive for shortness of breath and wheezing. Negative for cough.   Cardiovascular:  Negative for chest pain and palpitations.  Gastrointestinal:  Negative for abdominal pain and vomiting.  Genitourinary:  Negative for dysuria and hematuria.  Musculoskeletal:  Negative for arthralgias and back pain.  Skin:  Negative for color change and rash.  Neurological:  Positive for weakness. Negative for seizures and syncope.  All other systems reviewed and are negative.   Physical Exam Updated Vital Signs Ht 1.676 m (5\' 6" )   Wt 71.6 kg   BMI 25.48 kg/m  Physical Exam Vitals and nursing note reviewed.  Constitutional:      General: She is not in acute distress.    Appearance: Normal appearance. She is well-developed.  HENT:     Head: Normocephalic and atraumatic.     Mouth/Throat:     Mouth: Mucous membranes are moist.  Eyes:     Conjunctiva/sclera: Conjunctivae normal.  Cardiovascular:     Rate and Rhythm: Normal rate and regular rhythm.     Heart sounds: No murmur heard. Pulmonary:     Effort: Pulmonary effort is normal. No respiratory distress.     Breath sounds: Wheezing present.  Abdominal:     Palpations: Abdomen is soft.     Tenderness: There is no abdominal tenderness.  Musculoskeletal:        General: No swelling.     Cervical back: Neck supple.  Skin:    General: Skin is warm and dry.     Capillary Refill: Capillary refill takes less than 2 seconds.  Neurological:     General: No focal deficit present.     Mental  Status: She is alert and oriented to person, place, and time.     Cranial Nerves: No cranial nerve deficit.     Sensory: No sensory deficit.     Motor: No weakness.  Psychiatric:        Mood and Affect: Mood normal.     ED Results / Procedures / Treatments   Labs (all labs ordered are listed, but only abnormal results are displayed) Labs Reviewed  CBC WITH DIFFERENTIAL/PLATELET  COMPREHENSIVE METABOLIC PANEL  URINALYSIS, ROUTINE W REFLEX MICROSCOPIC  BRAIN NATRIURETIC PEPTIDE    EKG None  Radiology No results found.  Procedures Procedures    Medications Ordered in ED Medications  ipratropium-albuterol (DUONEB) 0.5-2.5 (3) MG/3ML nebulizer solution 3 mL (has no administration in time  range)    ED Course/ Medical Decision Making/ A&P                                 Medical Decision Making Amount and/or Complexity of Data Reviewed Labs: ordered. Radiology: ordered.  Risk Prescription drug management.  Patient with frequent falls in the last few days.  Says her legs just are not working right.  Raising concern for possible stroke.  Could possibly be deconditioning.  Will proceed with head CT which was negative.  Ordered MRI for further evaluation.  In addition patient had some wheezing when she got here.  We did give her another breathing treatment wheezing has resolved.  Apparently EMS gave her an albuterol nebulizer and Solu-Medrol.  Patient does have oxygen available at home that she uses as needed.  Labs here CBC white count 8.3 hemoglobin 16.1 platelets 166.  Urinalysis negative for urinary tract infection.  BNP good at 98.0.  Patient's complete metabolic panel was coming up with very unusual numbers.  Is saying sodium was 105.  Patient not acting like somebody severely hyponatremic.  Mental status is pretty reasonable.  Based on this have ordered BMP to be run separate.  Feel that the complete metabolic panel may have been contaminated or  diluted.  Patient's BNP is very much on the low side at 98.  Do not feel that the requirement for the oxygen is secondary to CHF.  Chest x-ray had no evidence of any acute cardiopulmonary disease.  Feel that it is probably secondary to COPD exacerbation.  Feel that was probably treated with the Solu-Medrol and the nebulizer treatments here and given by EMS.  Secondary to the fall patient does not seem to have had any injuries.  No concerns for hip fractures.  Back is not tender did not hit her head but head CT obviously would have ruled out any trauma.  Neck without any tenderness.  MRI without any acute findings.  There is maybe some progression of white matter lesions since 2008 and distribution concerning for demyelinating disease.  But again nothing acute.  Patient was interested in going home.  Will see how she ambulates.  Also we have yet to resolve her abnormal electrolytes which were way off reported at sodium of 105 patient clinically not acting like that.  Send off a BNP.  Apparently that he hemolyzed so we are going to go ahead and send another 1 and also do an i-STAT.    Final Clinical Impression(s) / ED Diagnoses Final diagnoses:  Fall, initial encounter  Chronic obstructive pulmonary disease with acute exacerbation [facility])    Rx / DC Orders ED Discharge Orders     None         Vanetta Mulders, MD 01/22/23 [id number]    Vanetta Mulders, MD 01/22/23 1420    Vanetta Mulders, MD 01/22/23 1422    Vanetta Mulders, MD 01/22/23 1478    Vanetta Mulders, MD 01/22/23 1528

## 2023-01-22 NOTE — ED Notes (Signed)
Istat sodium 107 and calcium, ion 0.83. EDP notified.

## 2023-01-22 NOTE — ED Provider Notes (Addendum)
At the time of change of shift care was signed out from Dr. Deretha Emory, following up with ICU physicians regarding severe hyponatremia.  Severe hypokalemia confirmed by lab, magnesium and potassium ordered  This patient has been discussed with Dr. Merrily Pew of the ICU service who has accepted the patient to an ICU bed, there is no ICU beds available.  This patient appears to be volume contracted with a hemoglobin that is elevated and appears dehydrated, normal saline has been started at 100 cc/h, there has been no signs of seizures or severe complications.  This patient is critically ill due to the nature of this complication and the requirement for ICU admission.  .Critical Care  Performed by: Eber Hong, MD Authorized by: Eber Hong, MD   Critical care provider statement:    Critical care time (minutes):  45   Critical care time was exclusive of:  Separately billable procedures and treating other patients   Critical care was time spent personally by me on the following activities:  Development of treatment plan with patient or surrogate, discussions with consultants, evaluation of patient's response to treatment, examination of patient, obtaining history from patient or surrogate, review of old charts, re-evaluation of patient's condition, pulse oximetry, ordering and review of radiographic studies, ordering and review of laboratory studies and ordering and performing treatments and interventions   I assumed direction of critical care for this patient from another provider in my specialty: no     Care discussed with: admitting provider   Comments:          Final diagnoses:  Fall, initial encounter  Chronic obstructive pulmonary disease with acute exacerbation (HCC)  Hyponatremia      Eber Hong, MD 01/22/23 1620    Eber Hong, MD 01/22/23 1621

## 2023-01-22 NOTE — Plan of Care (Signed)
[facility] Tonsillectomy/Adenoidectomy, Postoperative PEDS care plan entered in error.
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
## 2023-01-22 NOTE — ED Triage Notes (Signed)
Pt BIB [facility] for c/o fall and sob; pt told ems she had fell a couple of times this am and was having some wheezing pt was given solumedrol 125mg  IV   When ems arrived pt was found outside sitting on porch   Pt states she feels weak

## 2023-01-22 NOTE — Progress Notes (Signed)
eLink Physician-Brief Progress Note Patient Name: [name] DOB: 07-12-1945 MRN: [id number]   Date of Service  01/22/2023  HPI/Events of Note  Sodium 111 up from 106 on admit at 1500. K 2.6  eICU Interventions  Potassium replacement ongoing NS at 100 cc/hr decreased to carrier rate of 20 cc/hr     Intervention Category Intermediate Interventions: Electrolyte abnormality - evaluation and management  Henry Russel, P 01/22/2023, 8:33 PM

## 2023-01-23 DIAGNOSIS — E871 Hypo-osmolality and hyponatremia: Secondary | ICD-10-CM | POA: Diagnosis not present

## 2023-01-23 DIAGNOSIS — G928 Other toxic encephalopathy: Secondary | ICD-10-CM | POA: Diagnosis not present

## 2023-01-23 DIAGNOSIS — J441 Chronic obstructive pulmonary disease with (acute) exacerbation: Secondary | ICD-10-CM

## 2023-01-23 DIAGNOSIS — I272 Pulmonary hypertension, unspecified: Secondary | ICD-10-CM | POA: Diagnosis not present

## 2023-01-23 LAB — HEPATIC FUNCTION PANEL
ALT: 20 U/L (ref 0–44)
AST: 41 U/L (ref 15–41)
Albumin: 3.5 g/dL (ref 3.5–5.0)
Alkaline Phosphatase: 107 U/L (ref 38–126)
Bilirubin, Direct: 0.7 mg/dL — ABNORMAL HIGH (ref 0.0–0.2)
Indirect Bilirubin: 2.1 mg/dL — ABNORMAL HIGH (ref 0.3–0.9)
Total Bilirubin: 2.8 mg/dL — ABNORMAL HIGH (ref 0.3–1.2)
Total Protein: 6.2 g/dL — ABNORMAL LOW (ref 6.5–8.1)

## 2023-01-23 LAB — BASIC METABOLIC PANEL
Anion gap: 14 (ref 5–15)
BUN: 22 mg/dL (ref 8–23)
BUN: 20 mg/dL (ref 8–23)
CO2: 28 mmol/L (ref 22–32)
CO2: 30 mmol/L (ref 22–32)
Calcium: 9.8 mg/dL (ref 8.9–10.3)
Calcium: 8.9 mg/dL (ref 8.9–10.3)
Chloride: <65 mmol/L — CL (ref 98–111)
Chloride: 66 mmol/L — ABNORMAL LOW (ref 98–111)
Creatinine, Ser: 0.66 mg/dL (ref 0.44–1.00)
Creatinine, Ser: 0.63 mg/dL (ref 0.44–1.00)
GFR, Estimated: >60 mL/min (ref >60)
GFR, Estimated: >60 mL/min (ref >60)
Glucose, Bld: 107 mg/dL — ABNORMAL HIGH (ref 70–99)
Glucose, Bld: 115 mg/dL — ABNORMAL HIGH (ref 70–99)
Potassium: 2.8 mmol/L — ABNORMAL LOW (ref 3.5–5.1)
Potassium: 3.9 mmol/L (ref 3.5–5.1)
Sodium: 111 mmol/L — CL (ref 135–145)
Sodium: 110 mmol/L — CL (ref 135–145)

## 2023-01-23 LAB — CBC
HCT: 46.4 % — ABNORMAL HIGH (ref 36.0–46.0)
Hemoglobin: 14.8 g/dL (ref 12.0–15.0)
MCH: 22.5 pg — ABNORMAL LOW (ref 26.0–34.0)
MCHC: 31.9 g/dL (ref 30.0–36.0)
MCV: 70.5 fL — ABNORMAL LOW (ref 80.0–100.0)
Platelets: 140 10*3/uL — ABNORMAL LOW (ref 150–400)
RBC: 6.58 MIL/uL — ABNORMAL HIGH (ref 3.87–5.11)
RDW: 17.6 % — ABNORMAL HIGH (ref 11.5–15.5)
WBC: 9.3 10*3/uL (ref 4.0–10.5)
nRBC: 0 % (ref 0.0–0.2)

## 2023-01-23 LAB — SODIUM
Sodium: 110 mmol/L — CL (ref 135–145)
Sodium: 110 mmol/L — CL (ref 135–145)
Sodium: 111 mmol/L — CL (ref 135–145)
Sodium: 115 mmol/L — CL (ref 135–145)

## 2023-01-23 LAB — GLUCOSE, CAPILLARY: Glucose-Capillary: 126 mg/dL — ABNORMAL HIGH (ref 70–99)

## 2023-01-23 LAB — OSMOLALITY, URINE: Osmolality, Ur: 589 mosm/kg (ref 300–900)

## 2023-01-23 LAB — SODIUM, URINE, RANDOM: Sodium, Ur: 26 mmol/L

## 2023-01-23 MED ORDER — TOLVAPTAN 15 MG PO TABS
15.0000 mg | ORAL_TABLET | Freq: Once | ORAL | Status: AC
  Administered 2023-01-23: 15 mg via ORAL
  Filled 2023-01-23: qty 1

## 2023-01-23 MED ORDER — POTASSIUM CHLORIDE 10 MEQ/100ML IV SOLN
10.0000 meq | INTRAVENOUS | Status: AC
  Administered 2023-01-23 (×4): 10 meq via INTRAVENOUS
  Filled 2023-01-23 (×4): qty 100

## 2023-01-23 MED ORDER — POTASSIUM CHLORIDE CRYS ER 20 MEQ PO TBCR
40.0000 meq | EXTENDED_RELEASE_TABLET | Freq: Once | ORAL | Status: AC
  Administered 2023-01-23: 40 meq via ORAL
  Filled 2023-01-23: qty 2

## 2023-01-23 MED ORDER — SODIUM CHLORIDE 0.9 % IV SOLN
4.0000 g | Freq: Once | INTRAVENOUS | Status: AC
  Administered 2023-01-23: 4 g via INTRAVENOUS
  Filled 2023-01-23: qty 40

## 2023-01-23 MED ORDER — POTASSIUM CHLORIDE 10 MEQ/100ML IV SOLN
INTRAVENOUS | Status: AC
  Filled 2023-01-23: qty 100
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
## 2023-01-23 NOTE — Progress Notes (Signed)
eLink Physician-Brief Progress Note Patient Name: [name] DOB: [birth year] MRN: [id number]   Date of Service  01/23/2023  HPI/Events of Note  77 year old BF with COPD, HFpEF with pulmonary HTN who has a history of mild hyponatremia but now presents with severe hyponatremia thought to have SIADH.*  Received tolvaptan earlier today.  eICU Interventions  Goal sodium around 117-119 by noon 1/20   Continue to monitor serial sodiums.  No intervention currently required.     Intervention Category Intermediate Interventions: Electrolyte abnormality - evaluation and management  Etha Stambaugh 01/23/2023, 10:28 PM

## 2023-01-23 NOTE — Progress Notes (Signed)
Pharmacy Electrolyte Replacement  Recent Labs:  Recent Labs    01/23/23 0446  K 2.8*  CREATININE 0.66  iCa low at 0.83  MD Contacted: Dr. Aundria Rud  Plan: KCL PO and 4 runs Ca gluc 4gm IV  Nabila Albarracin D. Laney Potash, PharmD, BCPS, BCCCP 01/23/2023, 1:21 PM

## 2023-01-24 DIAGNOSIS — J441 Chronic obstructive pulmonary disease with (acute) exacerbation: Secondary | ICD-10-CM | POA: Diagnosis not present

## 2023-01-24 DIAGNOSIS — G928 Other toxic encephalopathy: Secondary | ICD-10-CM | POA: Diagnosis not present

## 2023-01-24 DIAGNOSIS — E871 Hypo-osmolality and hyponatremia: Secondary | ICD-10-CM | POA: Diagnosis not present

## 2023-01-24 LAB — SODIUM
Sodium: 114 mmol/L — CL (ref 135–145)
Sodium: 116 mmol/L — CL (ref 135–145)
Sodium: 117 mmol/L — CL (ref 135–145)
Sodium: 115 mmol/L — CL (ref 135–145)
Sodium: 118 mmol/L — CL (ref 135–145)

## 2023-01-24 LAB — BASIC METABOLIC PANEL
Anion gap: 15 (ref 5–15)
BUN: 17 mg/dL (ref 8–23)
CO2: 31 mmol/L (ref 22–32)
Calcium: 9.8 mg/dL (ref 8.9–10.3)
Chloride: 69 mmol/L — ABNORMAL LOW (ref 98–111)
Creatinine, Ser: 0.68 mg/dL (ref 0.44–1.00)
GFR, Estimated: >60 mL/min (ref >60)
Glucose, Bld: 91 mg/dL (ref 70–99)
Potassium: 3.3 mmol/L — ABNORMAL LOW (ref 3.5–5.1)
Sodium: 115 mmol/L — CL (ref 135–145)

## 2023-01-24 LAB — MAGNESIUM: Magnesium: 1.7 mg/dL (ref 1.7–2.4)

## 2023-01-24 LAB — PHOSPHORUS: Phosphorus: 1.9 mg/dL — ABNORMAL LOW (ref 2.5–4.6)

## 2023-01-24 MED ORDER — POTASSIUM PHOSPHATES 15 MMOLE/5ML IV SOLN
30.0000 mmol | Freq: Once | INTRAVENOUS | Status: AC
  Administered 2023-01-24: 30 mmol via INTRAVENOUS
  Filled 2023-01-24: qty 10

## 2023-01-24 MED ORDER — POTASSIUM PHOSPHATES 15 MMOLE/5ML IV SOLN: 30.0000 mmol | Freq: Once | INTRAVENOUS | Status: DC

## 2023-01-24 MED ORDER — TOLVAPTAN 15 MG PO TABS
15.0000 mg | ORAL_TABLET | Freq: Once | ORAL | Status: AC
  Administered 2023-01-24: 15 mg via ORAL
  Filled 2023-01-24: qty 1

## 2023-01-24 MED ORDER — MAGNESIUM SULFATE 2 GM/50ML IV SOLN
2.0000 g | Freq: Once | INTRAVENOUS | Status: AC
  Administered 2023-01-24: 2 g via INTRAVENOUS
  Filled 2023-01-24: qty 50

## 2023-01-24 MED ORDER — POTASSIUM CHLORIDE CRYS ER 20 MEQ PO TBCR
40.0000 meq | EXTENDED_RELEASE_TABLET | Freq: Once | ORAL | Status: AC
  Administered 2023-01-24: 40 meq via ORAL
  Filled 2023-01-24: qty 2
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
## 2023-01-24 NOTE — Progress Notes (Signed)
[facility] ADULT ICU REPLACEMENT PROTOCOL   The patient does apply for the [facility] Adult ICU Electrolyte Replacment Protocol based on the criteria listed below:   1.Exclusion criteria: TCTS, ECMO, Dialysis, and Myasthenia Gravis patients 2. Is GFR >/= 30 ml/min? Yes.    Patient's GFR today is >60 3. Is SCr </= 2? Yes.   Patient's SCr is 0.68 mg/dL 4. Did SCr increase >/= 0.5 in 24 hours? No. 5.Pt's weight >40kg  Yes.   6. Abnormal electrolyte(s): Phos 1.9, K+ 3.3, Mag 1.7  7. Electrolytes replaced per protocol 8.  Call MD STAT for K+ </= 2.5, Phos </= 1, or Mag </= 1 Physician:  Dr. Loralyn Freshwater, Lilia Argue 01/24/2023 4:54 AM

## 2023-01-25 DIAGNOSIS — E871 Hypo-osmolality and hyponatremia: Secondary | ICD-10-CM | POA: Diagnosis not present

## 2023-01-25 LAB — BASIC METABOLIC PANEL
Anion gap: 12 (ref 5–15)
Anion gap: 16 — ABNORMAL HIGH (ref 5–15)
BUN: 24 mg/dL — ABNORMAL HIGH (ref 8–23)
BUN: 28 mg/dL — ABNORMAL HIGH (ref 8–23)
CO2: 27 mmol/L (ref 22–32)
CO2: 26 mmol/L (ref 22–32)
Calcium: 8.5 mg/dL — ABNORMAL LOW (ref 8.9–10.3)
Calcium: 8.7 mg/dL — ABNORMAL LOW (ref 8.9–10.3)
Chloride: 82 mmol/L — ABNORMAL LOW (ref 98–111)
Chloride: 79 mmol/L — ABNORMAL LOW (ref 98–111)
Creatinine, Ser: 0.8 mg/dL (ref 0.44–1.00)
Creatinine, Ser: 0.82 mg/dL (ref 0.44–1.00)
GFR, Estimated: >60 mL/min (ref >60)
GFR, Estimated: >60 mL/min (ref >60)
Glucose, Bld: 118 mg/dL — ABNORMAL HIGH (ref 70–99)
Glucose, Bld: 121 mg/dL — ABNORMAL HIGH (ref 70–99)
Potassium: 4.3 mmol/L (ref 3.5–5.1)
Potassium: 3.4 mmol/L — ABNORMAL LOW (ref 3.5–5.1)
Sodium: 121 mmol/L — ABNORMAL LOW (ref 135–145)
Sodium: 121 mmol/L — ABNORMAL LOW (ref 135–145)

## 2023-01-25 LAB — HEPATIC FUNCTION PANEL
ALT: 25 U/L (ref 0–44)
AST: 22 U/L (ref 15–41)
Albumin: 3.6 g/dL (ref 3.5–5.0)
Alkaline Phosphatase: 110 U/L (ref 38–126)
Bilirubin, Direct: 0.3 mg/dL — ABNORMAL HIGH (ref 0.0–0.2)
Indirect Bilirubin: 0.9 mg/dL (ref 0.3–0.9)
Total Bilirubin: 1.2 mg/dL (ref 0.3–1.2)
Total Protein: 6.7 g/dL (ref 6.5–8.1)

## 2023-01-25 LAB — SODIUM
Sodium: 119 mmol/L — CL (ref 135–145)
Sodium: 118 mmol/L — CL (ref 135–145)
Sodium: 120 mmol/L — ABNORMAL LOW (ref 135–145)
Sodium: 125 mmol/L — ABNORMAL LOW (ref 135–145)

## 2023-01-25 LAB — GLUCOSE, CAPILLARY: Glucose-Capillary: 147 mg/dL — ABNORMAL HIGH (ref 70–99)

## 2023-01-25 MED ORDER — POTASSIUM CHLORIDE 20 MEQ PO PACK
60.0000 meq | PACK | Freq: Once | ORAL | Status: AC
  Administered 2023-01-25: 60 meq via ORAL
  Filled 2023-01-25: qty 3

## 2023-01-25 MED ORDER — NICOTINE 21 MG/24HR TD PT24
21.0000 mg | MEDICATED_PATCH | Freq: Every day | TRANSDERMAL | Status: DC
  Administered 2023-01-25: 21 mg via TRANSDERMAL
  Filled 2023-01-25 (×3): qty 1

## 2023-01-25 MED ORDER — TOLVAPTAN 15 MG PO TABS
30.0000 mg | ORAL_TABLET | Freq: Once | ORAL | Status: AC
  Administered 2023-01-25: 30 mg via ORAL
  Filled 2023-01-25: qty 2
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
## 2023-01-25 NOTE — Evaluation (Signed)
Physical Therapy Evaluation Patient Details Name: [name] MRN: [id number] DOB: 30-Dec-1945 Today's Date: 01/25/2023  History of Present Illness  Pt is 77 year old presented to [facility] on  01/22/23 for severe hyponatremia. PMH - pulmonary HTN, COPD, RVF, tobacco use, chronic back pain, neuropathy, thyroid disease, CAD  Clinical Impression  Pt admitted with above diagnosis and presents to PT with functional limitations due to deficits listed below (See PT problem list). Pt needs skilled PT to maximize independence and safety. Pt moving fairly well but with cognitive deficits may need some supervision at home.           If plan is discharge home, recommend the following: Supervision due to cognitive status;Help with stairs or ramp for entrance;Assist for transportation;Assistance with cooking/housework;Direct supervision/assist for medications management;Direct supervision/assist for financial management   Can travel by private vehicle        Equipment Recommendations None recommended by PT  Recommendations for Other Services       Functional Status Assessment Patient has had a recent decline in their functional status and demonstrates the ability to make significant improvements in function in a reasonable and predictable amount of time.     Precautions / Restrictions Precautions Precautions: Fall Restrictions Weight Bearing Restrictions: No      Mobility  Bed Mobility               General bed mobility comments: Pt up in chair    Transfers Overall transfer level: Needs assistance Equipment used: Rollator (4 wheels), Rolling walker (2 wheels), 1 person hand held assist Transfers: Sit to/from Stand Sit to Stand: Contact guard assist           General transfer comment: Assist for safety and lines    Ambulation/Gait Ambulation/Gait assistance: Contact guard assist Gait Distance (Feet): 150 Feet Assistive device: Rollator (4 wheels) Gait  Pattern/deviations: Step-through pattern, Decreased stride length, Drifts right/left Gait velocity: decr Gait velocity interpretation: 1.31 - 2.62 ft/sec, indicative of limited community ambulator   General Gait Details: Assist for safety  Stairs            Wheelchair Mobility     Tilt Bed    Modified Rankin (Stroke Patients Only)       Balance Overall balance assessment: Needs assistance Sitting-balance support: No upper extremity supported, Feet supported Sitting balance-Leahy Scale: Good     Standing balance support: No upper extremity supported Standing balance-Leahy Scale: Fair                               Pertinent Vitals/Pain      Home Living Family/patient expects to be discharged to:: Private residence Living Arrangements: Alone Available Help at Discharge: Family;Available PRN/intermittently Type of Home: House Home Access: Stairs to enter Entrance Stairs-Rails: Right;Left;Can reach both Entrance Stairs-Number of Steps: 6   Home Layout: One level Home Equipment: Rollator (4 wheels);Shower seat      Prior Function Prior Level of Function : Independent/Modified Independent                     Extremity/Trunk Assessment   Upper Extremity Assessment Upper Extremity Assessment: Defer to OT evaluation    Lower Extremity Assessment Lower Extremity Assessment: Generalized weakness       Communication   Communication Communication: No apparent difficulties  Cognition     Overall Cognitive Status: Impaired/Different from baseline Area of Impairment: Attention, Memory, Safety/judgement, Awareness, Problem solving  Current Attention Level: Sustained Memory: Decreased recall of precautions, Decreased short-term memory   Safety/Judgement: Decreased awareness of safety, Decreased awareness of deficits Awareness: Emergent Problem Solving: Requires verbal cues, Slow processing          General  Comments General comments (skin integrity, edema, etc.): On RA SpO2 90%. With amb on RA SpO2 87-88%. Replaced O2.    Exercises     Assessment/Plan    PT Assessment Patient needs continued PT services  PT Problem List Decreased strength;Decreased balance;Decreased mobility;Decreased cognition;Decreased safety awareness       PT Treatment Interventions DME instruction;Gait training;Stair training;Functional mobility training;Therapeutic activities;Therapeutic exercise;Balance training;Cognitive remediation;Patient/family education    PT Goals (Current goals can be found in the Care Plan section)  Acute Rehab PT Goals Patient Stated Goal: go home PT Goal Formulation: With patient Time For Goal Achievement: 02/08/23 Potential to Achieve Goals: Good    Frequency Min 1X/week     Co-evaluation               AM-PAC PT "6 Clicks" Mobility  Outcome Measure Help needed turning from your back to your side while in a flat bed without using bedrails?: None Help needed moving from lying on your back to sitting on the side of a flat bed without using bedrails?: A Little Help needed moving to and from a bed to a chair (including a wheelchair)?: A Little Help needed standing up from a chair using your arms (e.g., wheelchair or bedside chair)?: A Little Help needed to walk in hospital room?: A Little Help needed climbing 3-5 steps with a railing? : A Little 6 Click Score: 19    End of Session Equipment Utilized During Treatment: Gait belt Activity Tolerance: Patient tolerated treatment well Patient left: in chair;with call bell/phone within reach;with chair alarm set Nurse Communication: Mobility status PT Visit Diagnosis: Unsteadiness on feet (R26.81);Other abnormalities of gait and mobility (R26.89);Muscle weakness (generalized) (M62.81)    Time: 0865-7846 PT Time Calculation (min) (ACUTE ONLY): 13 min   Charges:   PT Evaluation $PT Eval Moderate Complexity: 1 Mod   PT  General Charges $$ ACUTE PT VISIT: 1 Visit         [facility] PT Acute Rehabilitation Services Office [phone]   Angelina Ok [facility] 01/25/2023, 11:35 AM
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
## 2023-01-25 NOTE — Progress Notes (Signed)
eLink Physician-Brief Progress Note Patient Name: [name] DOB: 08/19/1945 MRN: [id number]   Date of Service  01/25/2023  HPI/Events of Note  K3.4, creatinine 0.82 Last sodium 121 after tolvaptan  eICU Interventions  KCl ordered     Intervention Category Minor Interventions: Electrolytes abnormality - evaluation and management  Olga Bourbeau 01/25/2023, 9:16 PM
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
## 2023-01-26 DIAGNOSIS — E871 Hypo-osmolality and hyponatremia: Secondary | ICD-10-CM

## 2023-01-26 DIAGNOSIS — E876 Hypokalemia: Secondary | ICD-10-CM | POA: Diagnosis not present

## 2023-01-26 DIAGNOSIS — R946 Abnormal results of thyroid function studies: Secondary | ICD-10-CM

## 2023-01-26 LAB — HEPATIC FUNCTION PANEL
ALT: 22 U/L (ref 0–44)
AST: 27 U/L (ref 15–41)
Albumin: 3.9 g/dL (ref 3.5–5.0)
Alkaline Phosphatase: 107 U/L (ref 38–126)
Bilirubin, Direct: 0.5 mg/dL — ABNORMAL HIGH (ref 0.0–0.2)
Indirect Bilirubin: 1 mg/dL — ABNORMAL HIGH (ref 0.3–0.9)
Total Bilirubin: 1.5 mg/dL — ABNORMAL HIGH (ref 0.3–1.2)
Total Protein: 7 g/dL (ref 6.5–8.1)

## 2023-01-26 LAB — CBC
HCT: 53.2 % — ABNORMAL HIGH (ref 36.0–46.0)
Hemoglobin: 15.7 g/dL — ABNORMAL HIGH (ref 12.0–15.0)
MCH: 22.4 pg — ABNORMAL LOW (ref 26.0–34.0)
MCHC: 29.5 g/dL — ABNORMAL LOW (ref 30.0–36.0)
MCV: 75.9 fL — ABNORMAL LOW (ref 80.0–100.0)
Platelets: 181 10*3/uL (ref 150–400)
RBC: 7.01 MIL/uL — ABNORMAL HIGH (ref 3.87–5.11)
RDW: 18.7 % — ABNORMAL HIGH (ref 11.5–15.5)
WBC: 9.1 10*3/uL (ref 4.0–10.5)
nRBC: 0 % (ref 0.0–0.2)

## 2023-01-26 LAB — BASIC METABOLIC PANEL
Anion gap: 17 — ABNORMAL HIGH (ref 5–15)
Anion gap: 14 (ref 5–15)
BUN: 26 mg/dL — ABNORMAL HIGH (ref 8–23)
BUN: 26 mg/dL — ABNORMAL HIGH (ref 8–23)
CO2: 21 mmol/L — ABNORMAL LOW (ref 22–32)
CO2: 22 mmol/L (ref 22–32)
Calcium: 8.9 mg/dL (ref 8.9–10.3)
Calcium: 8.9 mg/dL (ref 8.9–10.3)
Chloride: 85 mmol/L — ABNORMAL LOW (ref 98–111)
Chloride: 86 mmol/L — ABNORMAL LOW (ref 98–111)
Creatinine, Ser: 0.69 mg/dL (ref 0.44–1.00)
Creatinine, Ser: 0.9 mg/dL (ref 0.44–1.00)
GFR, Estimated: >60 mL/min (ref >60)
GFR, Estimated: >60 mL/min (ref >60)
Glucose, Bld: 112 mg/dL — ABNORMAL HIGH (ref 70–99)
Glucose, Bld: 99 mg/dL (ref 70–99)
Potassium: 4.5 mmol/L (ref 3.5–5.1)
Potassium: 5.2 mmol/L — ABNORMAL HIGH (ref 3.5–5.1)
Sodium: 123 mmol/L — ABNORMAL LOW (ref 135–145)
Sodium: 122 mmol/L — ABNORMAL LOW (ref 135–145)

## 2023-01-26 LAB — MAGNESIUM: Magnesium: 1.7 mg/dL (ref 1.7–2.4)

## 2023-01-26 LAB — SODIUM
Sodium: 123 mmol/L — ABNORMAL LOW (ref 135–145)
Sodium: 127 mmol/L — ABNORMAL LOW (ref 135–145)

## 2023-01-26 LAB — PHOSPHORUS: Phosphorus: 1.6 mg/dL — ABNORMAL LOW (ref 2.5–4.6)

## 2023-01-26 MED ORDER — SODIUM CHLORIDE 1 G PO TABS
1.0000 g | ORAL_TABLET | Freq: Two times a day (BID) | ORAL | Status: DC
  Administered 2023-01-26 – 2023-01-27 (×4): 1 g via ORAL
  Filled 2023-01-26 (×4): qty 1

## 2023-01-26 MED ORDER — ONDANSETRON 4 MG PO TBDP: 4.0000 mg | ORAL_TABLET | Freq: Three times a day (TID) | ORAL | Status: DC | PRN

## 2023-01-26 MED ORDER — TOLVAPTAN 15 MG PO TABS
30.0000 mg | ORAL_TABLET | Freq: Once | ORAL | Status: AC
Start: 2023-01-26 — End: 2023-01-26
  Administered 2023-01-26: 30 mg via ORAL
  Filled 2023-01-26: qty 2

## 2023-01-26 MED ORDER — SODIUM PHOSPHATES 45 MMOLE/15ML IV SOLN
30.0000 mmol | Freq: Once | INTRAVENOUS | Status: AC
  Administered 2023-01-26: 30 mmol via INTRAVENOUS
  Filled 2023-01-26: qty 10

## 2023-01-26 MED ORDER — LORAZEPAM 2 MG/ML IJ SOLN
0.5000 mg | INTRAMUSCULAR | Status: AC | PRN
  Administered 2023-01-26 – 2023-01-27 (×2): 0.5 mg via INTRAVENOUS
  Filled 2023-01-26 (×2): qty 1

## 2023-01-26 MED ORDER — CLONAZEPAM 0.5 MG PO TABS
0.5000 mg | ORAL_TABLET | Freq: Two times a day (BID) | ORAL | Status: DC | PRN
  Administered 2023-01-27: 0.5 mg via ORAL
  Filled 2023-01-26 (×2): qty 1
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
## 2023-01-26 NOTE — TOC Initial Note (Signed)
Transition of Care [facility]) - Initial/Assessment Note    Patient Details  Name: [name] MRN: [id number] Date of Birth: 01/14/1946  Transition of [facility]) CM/SW Contact:    Lorri Frederick, LCSW Phone Number: 01/26/2023, 11:21 AM  Clinical Narrative:   CSW met with pt and daughter [name] regarding PT recommendation for [facility].  Pt from home alone, lives in [place].  Currently has home O2 through [employer].  Permission given to speak with son [name], daughter [name] [employer] employee)  Pt is agreeable to [facility]. Current DME in home: walker.  TOC will continue to follow.                  Expected Discharge Plan: Home w Home Health Services Barriers to Discharge: Continued Medical Work up   Patient Goals and CMS Choice Patient states their goals for this hospitalization and ongoing recovery are:: be like I was          Expected Discharge Plan and Services In-house Referral: Clinical Social Work   Post Acute Care Choice: Home Health Living arrangements for the past 2 months: Single Family Home                                      Prior Living Arrangements/Services Living arrangements for the past 2 months: Single Family Home Lives with:: Self Patient language and need for interpreter reviewed:: Yes Do you feel safe going back to the place where you live?: Yes      Need for Family Participation in Patient Care: Yes (Comment) Care giver support system in place?: Yes (comment) Current home services: DME (home O2 through [employer]) Criminal Activity/Legal Involvement Pertinent to Current Situation/Hospitalization: No - Comment as needed  Activities of Daily Living      Permission Sought/Granted Permission sought to share information with : Family Supports Permission granted to share information with : Yes, Verbal Permission Granted  Share Information with NAME: son [name], daughter [name]           Emotional Assessment Appearance:: Appears stated  age Attitude/Demeanor/Rapport: Other (comment) (distracted) Affect (typically observed): Pleasant Orientation: : Oriented to Self, Oriented to Place, Oriented to  Time, Oriented to Situation      Admission diagnosis:  Hypokalemia [E87.6] Hyponatremia [E87.1] Chronic obstructive pulmonary disease with acute exacerbation (HCC) [J44.1] Fall, initial encounter [W19.XXXA] Acute hyponatremia [E87.1] Patient Active Problem List   Diagnosis Date Noted   Hyponatremia 01/22/2023   Acute hyponatremia 01/22/2023   Hypokalemia 01/22/2023   Hypocalcemia 01/22/2023   Essential hypertension 10/02/2022   Hyperlipidemia 10/02/2022   Anxiety and depression 10/02/2022   Sciatica 10/02/2022   Nausea with vomiting 10/02/2022   Chronic asthmatic bronchitis (HCC) 07/16/2022   Chronic respiratory failure with hypoxia and hypercapnia (HCC) 07/16/2022   CHF (congestive heart failure) (HCC) 07/03/2022   Acute on chronic respiratory failure (HCC) 07/03/2022   Edema of left lower extremity 03/11/2022   Chronic obstructive pulmonary disease (HCC)    NSTEMI (non-ST elevated myocardial infarction) (HCC) 06/05/2021   Cigarette smoker 06/05/2021   Pulmonary hypertension (HCC) 06/05/2021   RVF (right ventricular failure) (HCC) 06/05/2021   GERD (gastroesophageal reflux disease)    Duodenal nodule 03/18/2020   Esophageal dysphagia 10/20/2019   H/O adenomatous polyp of colon 10/20/2019   COPD exacerbation (HCC) 07/26/2018   Abdominal pain, other specified site 08/25/2012   Loss of weight 08/25/2012   PCP:  Christel Mormon  Bea Laura, MD Pharmacy:   [facility] [place], [place] - [address] [place] [address] Phone: [phone] Fax: [phone]  [facility] - [place], [place] - [address] [facility] [address] [place] [address] [place] [address] Phone: [phone] Fax: [phone]     Social Determinants of Health (SDOH) Social History: SDOH Screenings   Food  Insecurity: No Food Insecurity (12/24/2022)  Housing: Low Risk  (12/24/2022)  Transportation Needs: No Transportation Needs (12/24/2022)  Utilities: Not At Risk (12/24/2022)  Alcohol Screen: Low Risk  (12/24/2022)  Depression (PHQ2-9): Medium Risk (12/24/2022)  Financial Resource Strain: Low Risk  (12/24/2022)  Physical Activity: Inactive (12/24/2022)  Social Connections: Moderately Isolated (12/24/2022)  Stress: Stress Concern Present (12/24/2022)  Tobacco Use: High Risk (01/22/2023)  Health Literacy: Adequate Health Literacy (12/24/2022)   SDOH Interventions:     Readmission Risk Interventions     No data to display

## 2023-01-26 NOTE — Progress Notes (Signed)
Pt refused all evening meds.

## 2023-01-26 NOTE — Plan of Care (Signed)

## 2023-01-26 NOTE — Progress Notes (Signed)
Physical Therapy Treatment Patient Details Name: [name] MRN: [id number] DOB: 12-Mar-1946 Today's Date: 01/26/2023   History of Present Illness Pt is 77 year old presented to [facility] on  01/22/23 for severe hyponatremia. PMH - pulmonary HTN, COPD, RVF, tobacco use, chronic back pain, neuropathy, thyroid disease, CAD    PT Comments  RN reports pt has walked around unit twice this morning already. Pt has finished lunch and request to get back to bed. Son present during session. Pt reluctantly agreeable to participate in stair stepping. RW placed over single step to simulate home environment. Pt steps up and catches L foot on step able to self steady and step up with B LE. Asked to repeat at which point pt impulsively steps up and back. Pt with no command follow as she lets go of RW, grabs onto bed rail and places R knee in bed and crawls into bed. Pt reports she feels unwell, and wants to be left alone. PT dimmed lights, turned on bed alarm and alerted RN. Pt has necessary strength to climb steps into home, however lacks safety awareness with her movement and will likely need 24 hour assist at discharge.    If plan is discharge home, recommend the following: Supervision due to cognitive status;Help with stairs or ramp for entrance;Assist for transportation;Assistance with cooking/housework;Direct supervision/assist for medications management;Direct supervision/assist for financial management   Can travel by private vehicle      Yes  Equipment Recommendations  None recommended by PT       Precautions / Restrictions Precautions Precautions: Fall Restrictions Weight Bearing Restrictions: No     Mobility  Bed Mobility Overal bed mobility: Needs Assistance Bed Mobility: Sit to Supine       Sit to supine: Contact guard assist   General bed mobility comments: pt insists on putting R knee up on bed surface to climb into bed, no command follow.    Transfers Overall transfer  level: Needs assistance Equipment used: Rolling walker (2 wheels) Transfers: Sit to/from Stand, Bed to chair/wheelchair/BSC Sit to Stand: Contact guard assist, Min assist   Step pivot transfers: Min assist       General transfer comment: good power up and able to self steady at RW over single step, turns loose of the RW, reaches for bed rail and climbs into beed    Ambulation/Gait                   Stairs Stairs: Yes Stairs assistance: Min assist Stair Management: With walker, Step to pattern Number of Stairs: 1 (x2) General stair comments: placed RW over single step, on power up pt catches L toe on step but is able to self steady power up and back down, attempts a second time with proper foot clearance.         Balance Overall balance assessment: Needs assistance Sitting-balance support: No upper extremity supported, Feet supported Sitting balance-Leahy Scale: Good     Standing balance support: No upper extremity supported Standing balance-Leahy Scale: Fair Standing balance comment: can stand without support statically with CGA,                            Cognition Arousal: Alert Behavior During Therapy: Impulsive Overall Cognitive Status: Impaired/Different from baseline Area of Impairment: Attention, Memory, Safety/judgement, Awareness, Problem solving                   Current Attention Level: Sustained Memory:  Decreased recall of precautions, Decreased short-term memory   Safety/Judgement: Decreased awareness of safety, Decreased awareness of deficits Awareness: Emergent Problem Solving: Requires verbal cues, Slow processing General Comments: pt with decreased command follow           General Comments General comments (skin integrity, edema, etc.): SpO2 on RA 92%O2, HR in 90s during session      Pertinent Vitals/Pain Pain Assessment Pain Assessment: No/denies pain     PT Goals (current goals can now be found in the care  plan section) Acute Rehab PT Goals Patient Stated Goal: go home PT Goal Formulation: With patient Time For Goal Achievement: 02/08/23 Potential to Achieve Goals: Good Progress towards PT goals: Progressing toward goals    Frequency    Min 1X/week       AM-PAC PT "6 Clicks" Mobility   Outcome Measure  Help needed turning from your back to your side while in a flat bed without using bedrails?: None Help needed moving from lying on your back to sitting on the side of a flat bed without using bedrails?: A Little Help needed moving to and from a bed to a chair (including a wheelchair)?: A Little Help needed standing up from a chair using your arms (e.g., wheelchair or bedside chair)?: A Little Help needed to walk in hospital room?: A Little Help needed climbing 3-5 steps with a railing? : A Little 6 Click Score: 19    End of Session Equipment Utilized During Treatment: Gait belt Activity Tolerance: Patient tolerated treatment well Patient left: in bed;with bed alarm set;with nursing/sitter in room Nurse Communication: Mobility status PT Visit Diagnosis: Unsteadiness on feet (R26.81);Other abnormalities of gait and mobility (R26.89);Muscle weakness (generalized) (M62.81)     Time: 2130-8657 PT Time Calculation (min) (ACUTE ONLY): 11 min  Charges:    $Gait Training: 8-22 mins PT General Charges $$ ACUTE PT VISIT: 1 Visit                     Shaketha Jeon B. Beverely Risen PT, DPT Acute Rehabilitation Services Please use secure chat or  Call Office [phone]    Elon Alas [facility] 01/26/2023, 1:54 PM

## 2023-01-26 NOTE — Progress Notes (Addendum)
PROGRESS NOTE   [name]  YOV:[id number]    DOB: [birth year]    DOA: 01/22/2023  PCP: Billie Lade, MD   I have briefly reviewed patients previous medical records in [facility].  Chief Complaint  Patient presents with   [facility] Course:   77 year old female, reportedly lives alone and is independent, medical history significant for HFpEF, COPD on home oxygen, GERD, HTN, CAD, tobacco use, pulmonary hypertension, initially presented to [facility] ED on 01/22/2023 for fall and shortness of breath.  With EMS, given nebulizer treatment, Solu-Medrol.  On arrival to ED, patient's oxygen saturation 90s on 2 L/min [place] oxygen.  CT head and brain MRI negative.  Lab work significant for serum sodium of 106, potassium 2.5, chloride <65.  ED consulted PCCM for admission.  Admitted for severe hyponatremia.  Nephrology consulted and directing hyponatremia care.  Care transferred from [facility] to [facility] on 10/22.   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 Now presented with serum sodium of 106. Serum osmolarity 242, urine osmolality 589, urine sodium 26. Nephrology consultation and follow-up appreciated and indicated that her presentation is consistent with SIADH. Lexapro can cause SIADH, currently held. Brief IV fluids on admission and since then has been getting Samsca daily since 10/19 with frequent BMP monitoring, fluid restriction. Most recent serum sodium up to 123 at midnight (repeat lab ordered but not drawn, discussed with RN).  Improving Per nephrology, repeating Samsca today and adding salt tablets with monitoring of serum sodium closely per protocol with aim for correction of no more than 8 mEq per 24 hours.  Acute metabolic  encephalopathy May be multifactorial but most importantly from recent acute hyponatremia Per prior provider notes, questionable progressive white matter disease on MRI which will need to be further evaluated as outpatient including neurology consultation Since admission, stopped SSRI, oxycodone Mental status has improved but remains impulsive, intermittent agitation and uncooperative to care. Delirium precautions. Addendum: Towards the later part of this evening, nursing updated patient that patient was reportedly taking some Klonopin and opioids at home according to the son.  Also getting more agitated, requiring Posey belt.  Not knowing chronicity of Klonopin use, restarted Klonopin at reduced dose 0.5 Mg every 12 hours as needed for anxiety. Monitor closely.  Hypokalemia  Ongoing p.o./IV replacement since admission Follow BMP daily  Hypophosphatemia and hypomagnesia Has been replaced in the past.  Follow and replace as needed.  Hyperlipidemia: Continue statins  Tobacco abuse Continue nicotine patch Cessation counseling when able.  GERD Continue PPI.  COPD on home oxygen Currently without clinical bronchospasm and saturating normally on room air.  HFpEF Clinically euvolemic.  Abnormal TFTs TSH 0.130 and free T4: 1.83 on 01/22/2023,?  Subclinical hypothyroidism versus sick euthyroid.  Check free T3.  May need to repeat.  Consider outpatient endocrinology consultation.  Body mass index is 21.03 kg/m.   DVT prophylaxis: heparin injection 5,000 Units Start: 01/22/23 2200 SCDs Start: 01/22/23 1832     Code Status: Full Code:  Family Communication: None at bedside Disposition:  Status is: Inpatient Ongoing significant hyponatremia, being managed with tolvaptan and frequent BMP monitoring.     Consultants:   PCCM Nephrology  Procedures:     Antimicrobials:  Subjective:  Walked into patient's room.  Patient sitting on the toilet and as per patient's 2 nurses  at bedside, refusing to come back to the chair or bed.  Patient insisting on going home with her son.  Reportedly son was told that she was being discharged yesterday.  Transiently tearful.  Finally agreed to return to the bed after scheduling her.  Denies complaints.  States that she is steady and does not fall.  Objective:   Vitals:   01/26/23 0344 01/26/23 0727 01/26/23 0805 01/26/23 0811  BP:    135/83  Pulse:   85 90  Resp:   19 (!) 21  Temp: 97.8 F (36.6 C)  97.8 F (36.6 C)   TempSrc: Oral Oral Oral   SpO2:   94% 93%  Weight:      Height:        General exam: Elderly female, moderately built and nourished, seen ambulating somewhat unsteadily and finally sitting up at edge of bed without distress.  Oral mucosa moist. Respiratory system: Clear to auscultation. Respiratory effort normal. Cardiovascular system: S1 & S2 heard, RRR. No JVD, murmurs, rubs, gallops or clicks. No pedal edema.  Telemetry personally reviewed: Sinus rhythm. Gastrointestinal system: Abdomen is nondistended, soft and nontender. No organomegaly or masses felt. Normal bowel sounds heard. Central nervous system: Alert and oriented to person, place, time. No focal neurological deficits. Extremities: Symmetric 5 x 5 power. Skin: No rashes, lesions or ulcers Psychiatry: Judgement and insight impaired. Mood & affect somewhat anxious and at times agitated.     Data Reviewed:   I have personally reviewed following labs and imaging studies   CBC: Recent Labs  Lab 01/22/23 1236 01/22/23 1536 01/22/23 1854 01/23/23 0446 01/26/23 0551  WBC 8.3  --  6.3 9.3 9.1  NEUTROABS 8.1*  --   --   --   --   HGB 16.1*   < > 15.8* 14.8 15.7*  HCT 51.5*   < > 49.7* 46.4* 53.2*  MCV 71.6*  --  70.0* 70.5* 75.9*  PLT 166  --  171 140* 181   < > = values in this interval not displayed.    Basic Metabolic Panel: Recent Labs  Lab 01/23/23 0446 01/23/23 0741 01/23/23 1634 01/23/23 2107 01/24/23 0318 01/24/23 1133  01/25/23 1030 01/25/23 1403 01/25/23 1711 01/25/23 2052 01/26/23 0014  NA 111*   < > 110*   < > 115*   < > 121* 120* 121* 125* 123*  K 2.8*  --  3.9  --  3.3*  --  4.3  --  3.4*  --   --   CL <65*  --  66*  --  69*  --  82*  --  79*  --   --   CO2 28  --  30  --  31  --  27  --  26  --   --   GLUCOSE 107*  --  115*  --  91  --  118*  --  121*  --   --   BUN 22  --  20  --  17  --  24*  --  28*  --   --   CREATININE 0.66  --  0.63  --  0.68  --  0.80  --  0.82  --   --   CALCIUM 9.8  --  8.9  --  9.8  --  8.5*  --  8.7*  --   --  MG  --   --   --   --  1.7  --   --   --   --   --   --   PHOS  --   --   --   --  1.9*  --   --   --   --   --   --    < > = values in this interval not displayed.    Liver Function Tests: Recent Labs  Lab 01/22/23 1854 01/23/23 1634 01/25/23 1403  AST 44* 41 22  ALT 23 20 25   ALKPHOS 129* 107 110  BILITOT 2.1* 2.8* 1.2  PROT 7.4 6.2* 6.7  ALBUMIN 4.2 3.5 3.6    CBG: Recent Labs  Lab 01/22/23 1939 01/23/23 0304 01/25/23 0752  GLUCAP 129* 126* 147*    Microbiology Studies:   Recent Results (from the past 240 hour(s))  MRSA Next Gen by PCR, Nasal     Status: None   Collection Time: 01/22/23  6:35 PM   Specimen: Nasal Mucosa; Nasal Swab  Result Value Ref Range Status   MRSA by PCR Next Gen NOT DETECTED NOT DETECTED Final    Comment: (NOTE) The GeneXpert MRSA Assay (FDA approved for NASAL specimens only), is one component of a comprehensive MRSA colonization surveillance program. It is not intended to diagnose MRSA infection nor to guide or monitor treatment for MRSA infections. Test performance is not FDA approved in patients less than 60 years old. Performed at [facility], [address] [place], [place] [address]     Radiology Studies:  No results found.  Scheduled Meds:    atorvastatin  40 mg Oral Daily   Chlorhexidine Gluconate Cloth  6 each Topical Daily   gabapentin  300 mg Oral TID   heparin  5,000 Units  Subcutaneous Q8H   nicotine  21 mg Transdermal Daily   pantoprazole  40 mg Oral Q1200   sodium chloride  1 g Oral BID WC   tolvaptan  30 mg Oral Once    Continuous Infusions:    sodium chloride Stopped (01/24/23 1735)     LOS: 4 days     Marcellus Scott, MD,  FACP, [facility], [facility], [facility]   [facility] [place]      To contact the attending provider between 7A-7P or the covering provider during after hours 7P-7A, please log into the web site [email or web address] and access using universal [place] password for that web site. If you do not have the password, please call the hospital operator.  01/26/2023, 10:20 AM

## 2023-01-27 DIAGNOSIS — E871 Hypo-osmolality and hyponatremia: Secondary | ICD-10-CM | POA: Diagnosis not present

## 2023-01-27 LAB — MAGNESIUM: Magnesium: 1.6 mg/dL — ABNORMAL LOW (ref 1.7–2.4)

## 2023-01-27 LAB — RENAL FUNCTION PANEL
Albumin: 3.8 g/dL (ref 3.5–5.0)
Anion gap: 12 (ref 5–15)
BUN: 17 mg/dL (ref 8–23)
CO2: 27 mmol/L (ref 22–32)
Calcium: 8.7 mg/dL — ABNORMAL LOW (ref 8.9–10.3)
Chloride: 90 mmol/L — ABNORMAL LOW (ref 98–111)
Creatinine, Ser: 0.59 mg/dL (ref 0.44–1.00)
GFR, Estimated: >60 mL/min (ref >60)
Glucose, Bld: 97 mg/dL (ref 70–99)
Phosphorus: 2.8 mg/dL (ref 2.5–4.6)
Potassium: 3.8 mmol/L (ref 3.5–5.1)
Sodium: 129 mmol/L — ABNORMAL LOW (ref 135–145)

## 2023-01-27 LAB — SODIUM: Sodium: 130 mmol/L — ABNORMAL LOW (ref 135–145)

## 2023-01-27 MED ORDER — SODIUM CHLORIDE 1 G PO TABS: 1.0000 g | ORAL_TABLET | Freq: Two times a day (BID) | ORAL | 0 refills | Status: AC

## 2023-01-27 MED ORDER — MAGNESIUM SULFATE 2 GM/50ML IV SOLN
2.0000 g | Freq: Once | INTRAVENOUS | Status: AC
  Administered 2023-01-27: 2 g via INTRAVENOUS
  Filled 2023-01-27: qty 50

## 2023-01-27 NOTE — Progress Notes (Addendum)
Occupational Therapy Treatment Patient Details Name: [name] MRN: [id number] DOB: Feb 24, 1946 Today's Date: 01/27/2023   History of present illness Pt is 77 year old presented to [facility] on  01/22/23 for severe hyponatremia. PMH - pulmonary HTN, COPD, RVF, tobacco use, chronic back pain, neuropathy, thyroid disease, CAD   OT comments  Pt eager to walk, ambulated with rollator entire unit with CGA, groomed at sink with min assist. Pt needing min assist to don backside gown. Returned to chair and set up to eat a snack. Provided warm blankets. Pt continues to demonstrate impaired/decreased awareness of deficits.       If plan is discharge home, recommend the following:  A little help with walking and/or transfers;A lot of help with bathing/dressing/bathroom;Assistance with cooking/housework;Direct supervision/assist for medications management;Direct supervision/assist for financial management;Assist for transportation;Help with stairs or ramp for entrance   Equipment Recommendations  None recommended by OT    Recommendations for Other Services      Precautions / Restrictions Precautions Precautions: Fall Restrictions Weight Bearing Restrictions: No       Mobility Bed Mobility               General bed mobility comments: in chair    Transfers Overall transfer level: Needs assistance Equipment used: Rollator (4 wheels) Transfers: Sit to/from Stand Sit to Stand: Supervision           General transfer comment: supervision for lines, cues for brakes on rollator     Balance Overall balance assessment: Needs assistance   Sitting balance-Leahy Scale: Good       Standing balance-Leahy Scale: Fair Standing balance comment: can stand without LOB to don backside gown, rollator for ambulation                           ADL either performed or assessed with clinical judgement   ADL Overall ADL's : Needs assistance/impaired     Grooming: Wash/dry  hands;Standing;Minimal assistance Grooming Details (indicate cue type and reason): washed hands prior to eating a snack         Upper Body Dressing : Minimal assistance;Standing Upper Body Dressing Details (indicate cue type and reason): front opening gown                 Functional mobility during ADLs: Contact guard assist;Rollator (4 wheels)      Extremity/Trunk Assessment              Vision       Perception     Praxis      Cognition Arousal: Alert Behavior During Therapy: Impulsive Overall Cognitive Status: Impaired/Different from baseline Area of Impairment: Attention, Memory, Safety/judgement, Awareness, Problem solving                   Current Attention Level: Sustained Memory: Decreased recall of precautions, Decreased short-term memory   Safety/Judgement: Decreased awareness of safety, Decreased awareness of deficits Awareness: Emergent Problem Solving: Requires verbal cues, Slow processing General Comments: pt upset with restraint        Exercises      Shoulder Instructions       General Comments      Pertinent Vitals/ Pain       Pain Assessment Pain Assessment: Faces Faces Pain Scale: No hurt  Home Living  Prior Functioning/Environment              Frequency  Min 1X/week        Progress Toward Goals  OT Goals(current goals can now be found in the care plan section)  Progress towards OT goals: Progressing toward goals  Acute Rehab OT Goals OT Goal Formulation: With patient Time For Goal Achievement: 02/08/23 Potential to Achieve Goals: Good  Plan      Co-evaluation                 AM-PAC OT "6 Clicks" Daily Activity     Outcome Measure   Help from another person eating meals?: None Help from another person taking care of personal grooming?: A Little Help from another person toileting, which includes using toliet, bedpan, or urinal?: A  Little Help from another person bathing (including washing, rinsing, drying)?: A Little Help from another person to put on and taking off regular upper body clothing?: A Little Help from another person to put on and taking off regular lower body clothing?: A Little 6 Click Score: 19    End of Session Equipment Utilized During Treatment: Gait belt;Rolling walker (2 wheels)  OT Visit Diagnosis: Unsteadiness on feet (R26.81);Other abnormalities of gait and mobility (R26.89);Muscle weakness (generalized) (M62.81);Other symptoms and signs involving cognitive function   Activity Tolerance Patient tolerated treatment well   Patient Left in chair;with call bell/phone within reach;with restraints reapplied;with chair alarm set   Nurse Communication Mobility status        Time: 1100-1129 OT Time Calculation (min): 29 min  Charges: OT General Charges $OT Visit: 1 Visit OT Treatments $Self Care/Home Management : 8-22 mins $Therapeutic Activity: 8-22 mins  [name], OTR/L Acute Rehabilitation Services Office: [phone]  [name] 01/27/2023, 11:41 AM
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
## 2023-01-27 NOTE — Progress Notes (Signed)
Pt. Discharged home with son. Discharge paperwork reviewed with son.

## 2023-01-27 NOTE — Discharge Instructions (Signed)
Advised to follow-up with nephrology in 2 weeks. Advised to take sodium chloride tablets 1 g twice daily for 14 days. 

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
## 2023-01-29 ENCOUNTER — Telehealth: Payer: Self-pay

## 2023-01-29 NOTE — Transitions of Care (Post Inpatient/ED Visit) (Signed)
01/29/2023  Name: [name] MRN: [id number] DOB: 1945/12/21  Today's TOC FU Call Status: Today's TOC FU Call Status:: Successful TOC FU Call Completed TOC FU Call Complete Date: 01/29/23 Patient's Name and Date of Birth confirmed.  Transition Care Management Follow-up Telephone Call Date of Discharge: 01/27/23 Discharge Facility: [facility]) Type of Discharge: Inpatient Admission Primary Inpatient Discharge Diagnosis:: Hyponatremia How have you been since you were released from the hospital?: Same Any questions or concerns?: No  Items Reviewed: Did you receive and understand the discharge instructions provided?: Yes Medications obtained,verified, and reconciled?: Yes (Medications Reviewed) Any new allergies since your discharge?: No Dietary orders reviewed?: NA Do you have support at home?: Yes People in Home: child(ren), adult Name of Support/Comfort Primary Source: Tim  Medications Reviewed Today: Medications Reviewed Today   Medications were not reviewed in this encounter     Home Care and Equipment/Supplies: Were Home Health Services Ordered?: NA Any new equipment or medical supplies ordered?: NA  Functional Questionnaire: Do you need assistance with bathing/showering or dressing?: No Do you need assistance with meal preparation?: No Do you need assistance with eating?: No Do you have difficulty maintaining continence: No Do you need assistance with getting out of bed/getting out of a chair/moving?: No Do you have difficulty managing or taking your medications?: No  Follow up appointments reviewed: PCP Follow-up appointment confirmed?: No MD Provider Line [phone] Given: No Specialist Hospital Follow-up appointment confirmed?: Yes Date of Specialist follow-up appointment?: 02/08/23 Follow-Up Specialty Provider:: Dr. Marisue Humble Do you need transportation to your follow-up appointment?: No Do you understand care options if your  condition(s) worsen?: Yes-patient verbalized understanding  SDOH Interventions Today    Flowsheet Row Most Recent Value  SDOH Interventions   Food Insecurity Interventions Intervention Not Indicated  Transportation Interventions Intervention Not Indicated      The patient declined home health services. Spoke with patient's son. The call was hard to hear. He did state she has a follow up appointment with a Nephrologist 11/4. She removed the nicotine patch and has resumed smoking. The family and patient declined further enrollment  Deidre Ala, RN RN Care Manager [facility] [phone]

## 2023-02-01 ENCOUNTER — Ambulatory Visit: Payer: Medicare Other | Admitting: Internal Medicine

## 2023-02-01 ENCOUNTER — Telehealth: Payer: Self-pay | Admitting: Internal Medicine

## 2023-02-01 NOTE — Telephone Encounter (Signed)
TOC call completed 

## 2023-02-01 NOTE — Telephone Encounter (Signed)
Scheduled a toc visit. Need TOC call?

## 2023-02-03 NOTE — Patient Instructions (Signed)

## 2023-02-04 ENCOUNTER — Ambulatory Visit (INDEPENDENT_AMBULATORY_CARE_PROVIDER_SITE_OTHER): Admitting: Family Medicine

## 2023-02-04 ENCOUNTER — Encounter: Payer: Self-pay | Admitting: Family Medicine

## 2023-02-04 VITALS — BP 135/69 | HR 87 | Ht 66.0 in | Wt 140.1 lb

## 2023-02-04 DIAGNOSIS — E038 Other specified hypothyroidism: Secondary | ICD-10-CM

## 2023-02-04 DIAGNOSIS — E878 Other disorders of electrolyte and fluid balance, not elsewhere classified: Secondary | ICD-10-CM

## 2023-02-04 DIAGNOSIS — J449 Chronic obstructive pulmonary disease, unspecified: Secondary | ICD-10-CM

## 2023-02-04 DIAGNOSIS — Z09 Encounter for follow-up examination after completed treatment for conditions other than malignant neoplasm: Secondary | ICD-10-CM | POA: Diagnosis not present

## 2023-02-04 MED ORDER — BREZTRI AEROSPHERE 160-9-4.8 MCG/ACT IN AERO: 2.0000 | INHALATION_SPRAY | Freq: Two times a day (BID) | RESPIRATORY_TRACT | 11 refills | Status: AC

## 2023-02-04 NOTE — Assessment & Plan Note (Addendum)
A repeat BMP, CBC, and thyroid panel have been ordered, and results are pending for follow-up. The most recent sodium level, checked on 10/29, was 129, with the patient managed on sodium tablets twice daily x 14 days for hyponatremia. The patient's son reported that Lexapro has been discontinued and switched to Zoloft. The latest BNP was 98, and the current regimen includes Lasix 40 mg and potassium chloride tablets, with cardiology following the patient for ongoing management.  Patient's son has observed changes in memory, with the patient scoring 25 on the MMSE. currently not receiving neurological care I strongly advised a follow-up with the primary care provider to ensure continued management of her chronic conditions.

## 2023-02-04 NOTE — Assessment & Plan Note (Addendum)
The patient has a history of COPD and is closely followed by pulmonology. Markus Daft was not listed on her current medications, so it has been prescribed along with instructions to continue albuterol nebulizer solution and inhaler as needed. I will inform the patient of this adjustment to her regimen and strongly encouraged smoking cessation. At home regimen  supplemental oxygen 2-4 lpm,according to the patient's son, patient is currently under hospice care.  If Breztri not cost effective I will send alternative.

## 2023-02-05 ENCOUNTER — Other Ambulatory Visit: Payer: Self-pay | Admitting: Family Medicine

## 2023-02-05 DIAGNOSIS — Z7689 Persons encountering health services in other specified circumstances: Secondary | ICD-10-CM

## 2023-02-10 ENCOUNTER — Encounter: Payer: Self-pay | Admitting: Internal Medicine

## 2023-02-10 LAB — BMP8+EGFR
BUN/Creatinine Ratio: 19 (ref 12–28)
BUN: 13 mg/dL (ref 8–27)
CO2: 24 mmol/L (ref 20–29)
Calcium: 9.1 mg/dL (ref 8.7–10.3)
Chloride: 95 mmol/L — ABNORMAL LOW (ref 96–106)
Creatinine, Ser: 0.7 mg/dL (ref 0.57–1.00)
Glucose: 87 mg/dL (ref 70–99)
Potassium: 4.9 mmol/L (ref 3.5–5.2)
Sodium: 139 mmol/L (ref 134–144)
eGFR: 88 mL/min/{1.73_m2} (ref >59)

## 2023-02-14 NOTE — Progress Notes (Deleted)
.   Cardiology Office Note   Date:  02/14/2023   ID:  [name], DOB 1946/02/19, MRN [id number]  PCP:  Billie Lade, MD  Cardiologist:   Dietrich Pates, MD   Patient follows up post hospital DC      History of Present Illness: [name] is a 77 y.o. female with a history of HTN, chest tightness, LE edema in past   The patient was admitted in early March 2023 for NSTEM Peak trop 1784  Underwent R and L heart cath    Mild CAD noted  ? Due to spasm   PCWP was mildly increased   Sent home on ASA, statin and lasix 3x per week    Echo showed  LVEF normal at that time     I saw the pt in March 2023  She comes in today with her son.   He says that she has been erratic, not dependably taking meds  She went to ER on 09/19/21 with severe SOB Wheezing    Weight was up   Pt edematous  She was diuresed with IV lasix    Improved   Sent home    Again, son says patient had not been taking meds     Isaw he pt in July 2023    Pt denies CP   Breathing OK  Sats at home 93%    NO dizziness   No palpitations     Takes acitvity slowely L ankle swollen   Lasix helps  BP runs about 120s, 132, 134  Isaw  the pt last winter   She has been seen by [facility]Ps since  No outpatient medications have been marked as taking for the 02/17/23 encounter (Appointment) with Pricilla Riffle, MD.     Allergies:   Chantix [varenicline], Codeine, Opsumit [macitentan], Tadalafil, Tyvaso [treprostinil], and Oxycodone   Past Medical History:  Diagnosis Date   Arthritis    Chronic back pain    Chronic heart failure with preserved ejection fraction (HFpEF) (HCC)    a. 06/2021 Echo: EF 60-65%, no rwma, GrI DD, mod-sev reduced RV fxn.   COPD (chronic obstructive pulmonary disease) (HCC)    patient is on oxygen at night when needed   Depression    GERD (gastroesophageal reflux disease)    Hypertension    Neuropathy    Nonobstructive CAD (coronary artery disease)    a. 06/2021 NSTEMI/Cath: LM nl, LAD  25d, LCX nl, RCA 30p.   PAH (pulmonary artery hypertension) (HCC)    a. 04/2018 PA 55/21 (36); b. 06/2021 Echo: EF 60-65%, no rwma, GrI DD, mod-sev reduced RV fxn, mod BAE, triv MR, mod TR; c. 06/2021 Cath: PA mean 35, PVR 7 WU.   Thyroid disease    Tobacco abuse    Ulcer    Billroth I    Past Surgical History:  Procedure Laterality Date   ABDOMINAL HYSTERECTOMY     BACK SURGERY     X4   BILROTH I PROCEDURE     BIOPSY  12/18/2019   Procedure: BIOPSY;  Surgeon: Corbin Ade, MD;  [facility];  Service: Endoscopy;;   BIOPSY  12/27/2020   Procedure: BIOPSY;  Surgeon: Corbin Ade, MD;  [facility];  Service: Endoscopy;;   CHOLECYSTECTOMY     COLONOSCOPY  05/18/2003   WUJ:[id number] colonoscopy/ Internal hemorrhoids.  Otherwise, normal rectum   COLONOSCOPY  08/12/2010   Dr. Lovell Sheehan: cecum visualized and normal, colon and  rectum normal. Torturous colon   COLONOSCOPY N/A 09/07/2012   RMR: tubular adenoma, lipoma. Due for surveillance in 2021   COLONOSCOPY WITH PROPOFOL N/A 12/18/2019   Rourk: Three 2-66mm polyps removed from the descending colon, tubular adenomas.  Diverticulosis.  Noncompliant left colon.   ESOPHAGOGASTRODUODENOSCOPY  05/18/2003   RMR:. Normal esophagus/Adenomatous-appearing mucosa at the anastomosis   ESOPHAGOGASTRODUODENOSCOPY  08/12/2010   Dr. Lovell Sheehan: anastomosis widely patent, no ulcerations, CLO test negative   ESOPHAGOGASTRODUODENOSCOPY (EGD) WITH PROPOFOL N/A 12/18/2019   Rourk: Normal esophagus status post dilation.  Prior hemigastrectomy.  Small bowel nodule of uncertain significance.  Biopsy with mild reactive changes and focal intestinal metaplasia.  Recommend 1 year follow-up EGD.   ESOPHAGOGASTRODUODENOSCOPY (EGD) WITH PROPOFOL N/A 12/27/2020   Rourk:Normal esophagus s/p dilation, s/p hemigastrectomy, Billroth I configuration, anastomotic nodule biopsied, polypoid duodenal mucosa with surface gastric foveolar metaplasia s/p peptic  injury, no malignancy   MALONEY DILATION N/A 12/18/2019   Procedure: MALONEY DILATION;  Surgeon: Corbin Ade, MD;  [facility];  Service: Endoscopy;  Laterality: N/A;   MALONEY DILATION N/A 12/27/2020   Procedure: Elease Hashimoto DILATION;  Surgeon: Corbin Ade, MD;  [facility];  Service: Endoscopy;  Laterality: N/A;   POLYPECTOMY  12/18/2019   Procedure: POLYPECTOMY;  Surgeon: Corbin Ade, MD;  [facility];  Service: Endoscopy;;   RIGHT HEART CATH N/A 05/04/2018   Procedure: RIGHT HEART CATH;  Surgeon: Laurey Morale, MD;  Location: [facility] INVASIVE CV LAB;  Service: Cardiovascular;  Laterality: N/A;   RIGHT/LEFT HEART CATH AND CORONARY ANGIOGRAPHY N/A 06/06/2021   Procedure: RIGHT/LEFT HEART CATH AND CORONARY ANGIOGRAPHY;  Surgeon: [place], Peter M, MD;  Location: [facility] INVAS[facility] CV LAB;  Service: Cardiovascular;  Laterality: N/A;   STOMACH SURGERY     removed partial stomach   YAG LASER APPLICATION Left 01/14/2016   Procedure: YAG LASER APPLICATION;  Surgeon: Jethro Bolus, MD;  Location: [facility];  Service: Ophthalmology;  Laterality: Left;   YAG LASER APPLICATION Right 01/28/2016   Procedure: YAG LASER APPLICATION;  Surgeon: Jethro Bolus, MD;  Location: [facility];  Service: Ophthalmology;  Laterality: Right;     Social History:  The patient  reports that she has been smoking cigarettes. She has a 59 pack-year smoking history. She has never used smokeless tobacco. She reports that she does not drink alcohol and does not use drugs.   Family History:  The patient's family history includes Cancer in her maternal aunt, paternal aunt, and sister; Cancer (age of onset: 77) in her brother.    ROS:  Please see the history of present illness. All other systems are reviewed and  Negative to the above problem except as noted.    PHYSICAL EXAM: VS:  There were no vitals taken for this visit.  GEN: Well nourished, well developed, in no acute distress  HEENT: normal   Neck: no JVD, carotid bruit Cardiac: RRR; no murmurs  No LE  edema  Respiratory:  Severely decreased airflow  GI: soft, nontender, nondistended, + BS  No hepatomegaly  MS: no deformity Moving all extremities   Skin: warm and dry, no rash Neuro:  Strength and sensation are intact Psych: euthymic mood, full affect   EKG:  EKG is not ordered today.  CARDIAC CATH: R/L3 06/2021   Prox RCA to Mid RCA lesion is 30% stenosed.   Dist LAD lesion is 25% stenosed.   LV end diastolic pressure is mildly elevated.   Hemodynamic findings consistent  with moderate pulmonary hypertension.   Mild nonobstructive CAD Moderate pulmonary HTN. PAP mean 35 mm Hg. PVR 7 WU Mild elevation of LV filling pressures. PCWP mean 15 mm Hg. LVEDP 16 mm Hg Normal cardiac output. Index 2.83.    Plan: medical management. Pulmonary pressures have not changed significantly since prior right  heart cath in 2020.    ECHO: 06/06/2021  1. Left ventricular ejection fraction, by estimation, is 60 to 65%. The  left ventricle has normal function. The left ventricle has no regional  wall motion abnormalities. There is mild left ventricular hypertrophy.  Left ventricular diastolic parameters are consistent with Grade I diastolic dysfunction (impaired relaxation). Elevated left atrial pressure.   2. Ventricular septum is flattened in systole suggesting RV pressure  overload. . Right ventricular systolic function moderately to severely  reduced. The right ventricular size is moderately to severely dilated.  There is moderately elevated pulmonary artery systolic pressure, 46.6.  3. Left atrial size was moderately dilated.   4. Right atrial size was moderately dilated.   5. The mitral valve is normal in structure. Trivial mitral valve  regurgitation. No evidence of mitral stenosis.   6. The tricuspid valve is abnormal. Tricuspid valve regurgitation is  moderate.   7. The aortic valve was not well visualized. There is mild  calcification  of the aortic valve. There is mild thickening of the aortic valve. Aortic  valve regurgitation is not visualized. No aortic stenosis is present.   8. The inferior vena cava is dilated in size with <50% respiratory  variability, suggesting right atrial pressure of 15 mmHg.  Lipid Panel    Component Value Date/Time   CHOL 222 (H) 09/28/2022 1537   TRIG 61 09/28/2022 1537   HDL 81 09/28/2022 1537   CHOLHDL 2.7 09/28/2022 1537   CHOLHDL 2.8 06/07/2021 0324   VLDL 18 06/07/2021 0324   LDLCALC 130 (H) 09/28/2022 1537      Wt Readings from Last 3 Encounters:  02/04/23 140 lb 1.3 oz (63.5 kg)  01/27/23 139 lb 8.8 oz (63.3 kg)  01/14/23 158 lb (71.7 kg)      ASSESSMENT AND PLAN:  1  HFpEF   Pt's volume status is good   Keep on current regimen   2  Hx CAD   Mild to mod at cath. NSTEMI due to ? Spasm    Pt remains symptom free e  Follow      3  HTN  BP better at home   Bring cuff to next visit   Keep on same regimne  4 HL Keep on Crstor   LDL 71      5  COPD  Pt on O2 at times   O2 better on it  Encouraged use         6  Tob  Continue to counsel   Down to 3 to 4 a day    Follow up in July     Current medicines are reviewed at length with the patient today.  The patient does not have concerns regarding medicines.  Signed, Dietrich Pates, MD  02/14/2023 10:20 PM    [facility] [address] [place], [place], [place]  [address] Phone: [phone]; Fax: [phone]

## 2023-02-17 ENCOUNTER — Ambulatory Visit: Payer: Medicare Other | Admitting: Internal Medicine
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
## 2023-03-09 ENCOUNTER — Encounter: Payer: Self-pay | Admitting: Neurology

## 2023-03-09 ENCOUNTER — Ambulatory Visit: Admitting: Neurology

## 2023-03-09 VITALS — BP 113/58 | HR 104 | Ht 66.0 in | Wt 145.5 lb

## 2023-03-09 DIAGNOSIS — F03A11 Unspecified dementia, mild, with agitation: Secondary | ICD-10-CM

## 2023-03-09 DIAGNOSIS — F418 Other specified anxiety disorders: Secondary | ICD-10-CM

## 2023-03-09 DIAGNOSIS — J9611 Chronic respiratory failure with hypoxia: Secondary | ICD-10-CM | POA: Diagnosis not present

## 2023-03-09 DIAGNOSIS — J9612 Chronic respiratory failure with hypercapnia: Secondary | ICD-10-CM

## 2023-03-09 DIAGNOSIS — J449 Chronic obstructive pulmonary disease, unspecified: Secondary | ICD-10-CM

## 2023-03-09 DIAGNOSIS — R9082 White matter disease, unspecified: Secondary | ICD-10-CM

## 2023-03-09 DIAGNOSIS — I509 Heart failure, unspecified: Secondary | ICD-10-CM

## 2023-04-04 NOTE — Progress Notes (Unsigned)
[name], female    DOB: [birth year]    MRN: [id number]   Brief patient profile:  46  yobf  active smoker  referred to pulmonary clinic in [place]  07/16/2022 by [name]  for AB / 02 dep onset of symptoms  1990s    Started on 02 x since around 2020 per pt  Inhaler dep since 2023   - PFT's  05/12/2018  FEV1 1.45 (74 % ) ratio 0.72  p 29 % improvement from saba p ? prior to study with DLCO  10.67 (51%)   and FV curve mildly concave     History of Present Illness  07/16/2022  Pulmonary/ 1st office eval/ Danilo Cappiello / [name] maint breztri but suboptimal technique  Chief Complaint  Patient presents with   Follow-up    Pt f/u son states that pt finished a course of prednisone on 4/3 & symptoms (SOB, coughing, wheezing) returned in 4/5. She is currently using 3.5L continuous O2 @ home & 4L on POC  Dyspnea:  grocery shopping /walking to car in driveway a challenge   Cough: smoker's rattle  Sleep: bed is flat / 2 pillows  SABA use: none 02: 2 lpm / prn daytime  > lately up to 3.5 but not really titrating effectively  Breathing much better just while on pred and worse p a few days off  Lung cancer screen: had CTa 06/07/21 neg nodules Rec Plan A = Automatic = Always=    Breztri Take 2 puffs first thing in am and then another 2 puffs about 12 hours later.  Work on inhaler technique: Prednisone 10 mg 2 each am until better then 1 daily x 5 days and stop  Plan B = Backup (to supplement plan A, not to replace it) Only use your albuterol inhaler as a rescue medication  Plan C = Crisis (instead of Plan B but only if Plan B stops working) - only use your albuterol nebulizer if you first try Plan B  Pantoprazole (protonix) 40 mg   Take  30-60 min before first meal of the day and Pepcid (famotidine)  20 mg after supper until return to office          12/31/2022  f/u ov/[facility]/Allex Madia re: AB maint on breztri  but very limited ability to use it  No chief complaint on  file.  Dyspnea:  limited by back and leg swelling  Cough: none  Sleeping: 45 degrees  s    resp cc  SABA use:  neb  02: 2lpm Lung cancer screening: 12/01/22 Rec Plan A = Automatic = Always=    Breztri Take 2 puffs first thing in am and then another 2 puffs about 12 hours later.   Work on inhaler technique: Plan B = Backup (to supplement plan A, not to replace it) Only use your albuterol inhaler as a rescue medication  Plan C = Crisis (instead of Plan B but only if Plan B stops working) - only use your albuterol nebulizer if you first try Plan B Make sure you check your oxygen saturation  AT  your highest level of activity (not after you stop)   to be sure it stays over 90%  Please schedule a follow up visit in 3 months but call sooner if needed  with all medications /inhalers/ solutions in hand    04/05/2023  f/u ov/[facility]/Cheyane Ayon re: AB  maint on  combivent 2 bid and prn saba  [facility]  Chief Complaint  Patient presents with   Follow-up  Dyspnea:  gets let out at door, at most walking 50 ft on 02 ut not checking sats  Cough: in am's x one hour min mucoid Sleeping: 45 degree electri bed s resp cc  SABA use: couple times a day - not takes combivent 2 every 12 instead of one puff qid  02: 2lpm      No obvious day to day or daytime variability or assoc   mucus plugs or hemoptysis or cp or chest tightness, subjective wheeze or overt sinus or hb symptoms.    Also denies any obvious fluctuation of symptoms with weather or environmental changes or other aggravating or alleviating factors except as outlined above   No unusual exposure hx or h/o childhood pna/ asthma or knowledge of premature birth.  Current Allergies, Complete Past Medical History, Past Surgical History, Family History, and Social History were reviewed in [employer] record.  ROS  The following are not active complaints unless bolded Hoarseness, sore throat, dysphagia, dental  problems, itching, sneezing,  nasal congestion or discharge of excess mucus or purulent secretions, ear ache,   fever, chills, sweats, unintended wt loss or wt gain, classically pleuritic or exertional cp,  orthopnea pnd or arm/hand swelling  or leg swelling, presyncope, palpitations, abdominal pain, anorexia, nausea, vomiting, diarrhea  or change in bowel habits or change in bladder habits, change in stools or change in urine, dysuria, hematuria,  rash, arthralgias, visual complaints, headache, numbness, weakness or ataxia or problems with walking or coordination,  change in mood or  memory.        Current Meds  Medication Sig   albuterol (2.5 MG/3ML) 0.083% NEBU 3 mL, albuterol (5 MG/ML) 0.5% NEBU 0.5 mL Inhale into the lungs.   albuterol (VENTOLIN HFA) 108 (90 Base) MCG/ACT inhaler Inhale 2 puffs into the lungs every 4 (four) hours as needed for wheezing or shortness of breath.   atorvastatin (LIPITOR) 40 MG tablet Take 1 tablet (40 mg total) by mouth daily.   Budeson-Glycopyrrol-Formoterol (BREZTRI AEROSPHERE) 160-9-4.8 MCG/ACT AERO Inhale 2 puffs into the lungs 2 (two) times daily.   clonazePAM (KLONOPIN) 0.5 MG tablet Take 0.5 mg by mouth every 6 (six) hours as needed.   COMBIVENT RESPIMAT 20-100 MCG/ACT AERS respimat Inhale 1 puff into the lungs every 4 (four) hours as needed for wheezing or shortness of breath.   famotidine (PEPCID) 20 MG tablet One after supper (Patient taking differently: Take 20 mg by mouth daily. Takes in the morning)   gabapentin (NEURONTIN) 300 MG capsule Take 300 mg by mouth 2 (two) times daily.   HYDROcodone-acetaminophen (NORCO) 10-325 MG tablet Take 1 tablet by mouth every 4 (four) hours as needed (severe back pain.).   metolazone (ZAROXOLYN) 5 MG tablet Take 5 mg by mouth daily.   nitroGLYCERIN (NITROSTAT) 0.4 MG SL tablet Place 1 tablet (0.4 mg total) under the tongue every 5 (five) minutes as needed for chest pain.   OXYGEN Inhale 2 L into the lungs.    pantoprazole (PROTONIX) 40 MG tablet Take 30-60 min before first meal of the day (Patient taking differently: Take 40 mg by mouth at bedtime. Take 30-60 min before first meal of the day)   potassium chloride (KLOR-CON) 10 MEQ tablet Take 1 tablet (10 mEq total) by mouth every Monday, Wednesday, and Friday. Take 1 tab when you take the furosemide/Lasix tablet (Patient taking differently: Take 10 mEq by mouth daily.)   sertraline (ZOLOFT)  20 MG/ML concentrated solution Take 20 mg by mouth daily.                Past Medical History:  Diagnosis Date   Arthritis    Chronic back pain    Chronic heart failure with preserved ejection fraction (HFpEF)    a. 06/2021 Echo: EF 60-65%, no rwma, GrI DD, mod-sev reduced RV fxn.   COPD (chronic obstructive pulmonary disease)    patient is on oxygen at night when needed   Depression    GERD (gastroesophageal reflux disease)    Hypertension    Neuropathy    Nonobstructive CAD (coronary artery disease)    a. 06/2021 NSTEMI/Cath: LM nl, LAD 25d, LCX nl, RCA 30p.   PAH (pulmonary artery hypertension)    a. 04/2018 PA 55/21 (36); b. 06/2021 Echo: EF 60-65%, no rwma, GrI DD, mod-sev reduced RV fxn, mod BAE, triv MR, mod TR; c. 06/2021 Cath: PA mean 35, PVR 7 WU.   Thyroid disease    Tobacco abuse    Ulcer    [place] I        Objective:    Wts  04/05/2023      145  12/31/2022        159   08/27/22 152 lb 6.4 oz (69.1 kg)  08/13/22 148 lb (67.1 kg)  07/16/22 148 lb 9.6 oz (67.4 kg)    Vital signs reviewed  04/05/2023     General appearance:    elderly  amb bf easily confused with details of care      HEENT : Oropharynx  clear     NECK :  without  apparent JVD/ palpable Nodes/TM    LUNGS: no acc muscle use,  Min barrel  contour chest wall with bilateral  slightly decreased bs s audible wheeze and  without cough on insp or exp maneuvers and min  Hyperresonant  to  percussion bilaterally    CV:  RRR  no s3 or murmur or increase in P2, and  1+  pitting edema both LEs  ABD:  soft and nontender with pos end  insp Hoover's  in the supine position.  No bruits or organomegaly appreciated   MS:  Nl gait/ ext warm without deformities Or obvious joint restrictions  calf tenderness, cyanosis or clubbing     SKIN: warm and dry without lesions    NEURO:  alert,   with  no motor or cerebellar deficits apparent.             I personally reviewed images and agree with radiology impression as follows:  CXR:   portable  01/22/23  No evidence of acute cardiopulmonary disease.          Assessment

## 2023-04-05 ENCOUNTER — Ambulatory Visit: Admitting: Internal Medicine

## 2023-04-05 ENCOUNTER — Encounter: Payer: Self-pay | Admitting: Internal Medicine

## 2023-04-05 VITALS — BP 119/61 | HR 77 | Ht 68.0 in | Wt 145.4 lb

## 2023-04-05 DIAGNOSIS — J9612 Chronic respiratory failure with hypercapnia: Secondary | ICD-10-CM | POA: Diagnosis not present

## 2023-04-05 DIAGNOSIS — J9611 Chronic respiratory failure with hypoxia: Secondary | ICD-10-CM | POA: Diagnosis not present

## 2023-04-05 DIAGNOSIS — J4489 Other specified chronic obstructive pulmonary disease: Secondary | ICD-10-CM

## 2023-04-06 NOTE — Assessment & Plan Note (Signed)
 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   F/u is prn    Each maintenance medication was reviewed in detail including emphasizing most importantly the difference between maintenance and prns and under what circumstances the prns are to be triggered using an action plan format where appropriate.  Total time for H and P, chart review, counseling, reviewing hfa/smi device(s) and generating customized AVS unique to this office visit / same day charting = 31 min final summary f/u ov

## 2023-04-06 NOTE — Assessment & Plan Note (Signed)
 Active smoker - PFT's  05/12/18  FEV1 1.45 (74 % ) ratio 0.72  p 29 % improvement from saba p ? prior to study with DLCO  10.67 (51%)   and FV curve mildly concave  - 07/16/2022  After extensive coaching inhaler device,  effectiveness =    60% from a baseline of 30% > continue breztry with pred as backup = 20 mg until better, 10 mg x 5 days and try off  - 12/31/2022  After extensive coaching inhaler device,  effectiveness =    50% from a baseline near 0 and having trouble affording breztri  so offered bud/alb neb but pt declined / under hospice care now so f/u q3 m optional   - The proper method of use, as well as anticipated side effects, of a metered-dose inhaler were discussed and demonstrated to the patient using teach back method. Improved effectiveness after extensive coaching during this visit to a level of approximately 50% % from a baseline of 0 %   >>> I don't think she's really benefiting from use of hfa or SMI inhalers and would have a low threshold to just shift over to duoneb qid with bud 0.25 mg bid  all per neb in event her breathing deteriorates but defer further pulmonary rx to PCP/ hospice in this setting

## 2023-04-13 ENCOUNTER — Encounter: Payer: Self-pay | Admitting: Internal Medicine

## 2023-04-13 ENCOUNTER — Other Ambulatory Visit: Payer: Self-pay | Admitting: Internal Medicine

## 2023-04-13 DIAGNOSIS — M5431 Sciatica, right side: Secondary | ICD-10-CM

## 2023-04-13 NOTE — Telephone Encounter (Signed)
 Order placed

## 2023-04-30 ENCOUNTER — Telehealth: Payer: Self-pay | Admitting: Internal Medicine

## 2023-04-30 NOTE — Telephone Encounter (Signed)
Copied from CRM [id number]. Topic: Referral - Question >> Apr 30, 2023 12:27 PM Herbert Seta B wrote: Reason for CRM: Patient son [name] calling because wanting to know if referral for spine injections was sent to [facility] by PCP? [phone]

## 2023-04-30 NOTE — Telephone Encounter (Signed)
Yes, someone should be reaching out to her from [facility] imaging or she can call them to schedule at [phone]

## 2023-05-03 NOTE — Telephone Encounter (Signed)
Called patient son he will contact imaging.

## 2023-05-06 NOTE — Discharge Instructions (Signed)

## 2023-05-07 ENCOUNTER — Ambulatory Visit
Admission: RE | Admit: 2023-05-07 | Discharge: 2023-05-07 | Disposition: A | Discharge status: Home / Self Care | Payer: Medicare Other | Source: Ambulatory Visit | Attending: Internal Medicine | Admitting: Internal Medicine

## 2023-05-07 ENCOUNTER — Ambulatory Visit: Payer: Medicare Other | Admitting: Internal Medicine

## 2023-05-07 DIAGNOSIS — M5431 Sciatica, right side: Secondary | ICD-10-CM

## 2023-05-07 MED ORDER — IOPAMIDOL (ISOVUE-M 200) INJECTION 41%
1.0000 mL | Freq: Once | INTRAMUSCULAR | Status: AC
  Administered 2023-05-07: 1 mL via EPIDURAL

## 2023-05-07 MED ORDER — METHYLPREDNISOLONE ACETATE 40 MG/ML INJ SUSP (RADIOLOG
80.0000 mg | Freq: Once | INTRAMUSCULAR | Status: AC
  Administered 2023-05-07: 80 mg via EPIDURAL

## 2023-06-08 ENCOUNTER — Telehealth: Payer: Self-pay | Admitting: Pharmacy Technician

## 2023-06-08 ENCOUNTER — Other Ambulatory Visit (HOSPITAL_COMMUNITY): Payer: Self-pay

## 2023-06-08 NOTE — Telephone Encounter (Signed)
 Pharmacy Patient Advocate Encounter   Received notification from Onbase that prior authorization for BREZTRI 160-9-4.8 MCG AEROSPHERE is required/requested.   Insurance verification completed.   The patient is insured through [facility] .   Per test claim: Refill too soon. PA is not needed at this time. Medication was filled 06/08/2023. Next eligible fill date is 06/28/2023.

## 2023-08-11 ENCOUNTER — Other Ambulatory Visit: Payer: Self-pay | Admitting: Internal Medicine

## 2023-08-11 ENCOUNTER — Telehealth: Payer: Self-pay | Admitting: Internal Medicine

## 2023-08-11 DIAGNOSIS — M5431 Sciatica, right side: Secondary | ICD-10-CM

## 2023-08-11 NOTE — Telephone Encounter (Signed)
 Orders placed.

## 2023-08-11 NOTE — Telephone Encounter (Signed)
 Copied from CRM [id number]. Topic: Clinical - Request for Lab/Test Order >> Aug 10, 2023  4:25 PM Sophia H wrote: Reason for CRM: Spoke with pt's son Daffney Coll who is requesting orders for the patient. Wanting Epidural injection sent to [facility] in [place]. Best contact # [phone]

## 2023-08-17 ENCOUNTER — Ambulatory Visit
Admission: RE | Admit: 2023-08-17 | Discharge: 2023-08-17 | Disposition: A | Discharge status: Home / Self Care | Source: Ambulatory Visit | Attending: Internal Medicine | Admitting: Internal Medicine

## 2023-08-17 DIAGNOSIS — M5432 Sciatica, left side: Secondary | ICD-10-CM

## 2023-08-17 MED ORDER — METHYLPREDNISOLONE ACETATE 40 MG/ML INJ SUSP (RADIOLOG
80.0000 mg | Freq: Once | INTRAMUSCULAR | Status: AC
  Administered 2023-08-17: 80 mg via EPIDURAL

## 2023-08-17 MED ORDER — IOPAMIDOL (ISOVUE-M 200) INJECTION 41%
1.0000 mL | Freq: Once | INTRAMUSCULAR | Status: AC
  Administered 2023-08-17: 1 mL via EPIDURAL

## 2023-09-01 ENCOUNTER — Ambulatory Visit: Admitting: Internal Medicine

## 2023-09-01 ENCOUNTER — Other Ambulatory Visit (HOSPITAL_COMMUNITY): Payer: Self-pay | Admitting: Internal Medicine

## 2023-09-01 DIAGNOSIS — Z1231 Encounter for screening mammogram for malignant neoplasm of breast: Secondary | ICD-10-CM

## 2023-09-07 ENCOUNTER — Encounter: Payer: Self-pay | Admitting: Internal Medicine

## 2023-09-08 ENCOUNTER — Ambulatory Visit (HOSPITAL_COMMUNITY)
Admission: RE | Admit: 2023-09-08 | Discharge: 2023-09-08 | Disposition: A | Discharge status: Home / Self Care | Source: Ambulatory Visit | Attending: Internal Medicine | Admitting: Internal Medicine

## 2023-09-08 ENCOUNTER — Encounter (HOSPITAL_COMMUNITY): Payer: Self-pay

## 2023-09-08 DIAGNOSIS — Z1231 Encounter for screening mammogram for malignant neoplasm of breast: Secondary | ICD-10-CM | POA: Insufficient documentation

## 2023-12-13 ENCOUNTER — Telehealth: Payer: Self-pay

## 2023-12-13 ENCOUNTER — Ambulatory Visit: Payer: Self-pay

## 2023-12-13 NOTE — Telephone Encounter (Signed)
 Copied from CRM [id number]. Topic: Referral - Request for Referral >> Dec 13, 2023 10:42 AM Nathanel BROCKS wrote: Did the patient discuss referral with their provider in the last year? Yes (If No - schedule appointment) (If Yes - send message)  Appointment offered? No  Type of order/referral and detailed reason for visit: [facility] for a steroid injection in back   Preference of office, provider, location: [facility]  If referral order, have you been seen by this specialty before? Yes (If Yes, this issue or another issue? When? Where?[facility]  Can we respond through MyChart? Yes

## 2023-12-13 NOTE — Telephone Encounter (Signed)
 Patient scheduled.

## 2023-12-13 NOTE — Telephone Encounter (Signed)
 Spoke with patient attempted to schedule- she requested me to call her son and schedule with him. I attempted to reach her son LVM for him to call back can be scheduled regular OV with any provider.

## 2023-12-13 NOTE — Telephone Encounter (Signed)
 FYI Only or Action Required?: Action required by provider: update on patient condition.  Patient was last seen in primary care on 02/04/2023 by Terry Wilhelmena Lloyd Hilario, FNP.  Called Nurse Triage reporting Leg Pain and Back Pain.  Symptoms began chronic.  Interventions attempted: Rest, hydration, or home remedies.  Symptoms are: unchanged.  Triage Disposition: See PCP Within 2 Weeks  Patient/caregiver understands and will follow disposition?: Yes   Copied from CRM [id number]. Topic: Clinical - Red Word Triage >> Dec 13, 2023  3:29 PM Lauren C wrote: Red Word that prompted transfer to Nurse Triage: increased pain in legs, increased pain in lower back. Son [name] ([place]) was calling to make an appointment to get an order for steroid shots for her. Reason for Disposition  Back pain is a chronic symptom (recurrent or ongoing AND present > 4 weeks)  Answer Assessment - Initial Assessment Questions Additional info: Initially called for steroid injection order, See TE from earlier today for son to schedule ov with any provider. Scheduled.    1. ONSET: When did the pain begin? (e.g., minutes, hours, days)     chronic 2. LOCATION: Where does it hurt? (upper, mid or lower back)     Lower back and bilateral legs 3. SEVERITY: How bad is the pain?  (e.g., Scale 1-10; mild, moderate, or severe)     Moderate-severe 4. PATTERN: Is the pain constant? (e.g., yes, no; constant, intermittent)      constant 5. RADIATION: Does the pain shoot into your legs or somewhere else?     Denies 6. CAUSE:  What do you think is causing the back pain?      chronic 7. BACK OVERUSE:  Any recent lifting of heavy objects, strenuous work or exercise?     denies 8. MEDICINES: What have you taken so far for the pain? (e.g., nothing, acetaminophen , NSAIDS)     Steroid injection have been most helpful.  9. NEUROLOGIC SYMPTOMS: Do you have any weakness, numbness, or problems with bowel/bladder  control?     denies 10. OTHER SYMPTOMS: Do you have any other symptoms? (e.g., fever, abdomen pain, burning with urination, blood in urine)  Denies-on hospice care  Protocols used: Back Pain-A-AH

## 2023-12-15 ENCOUNTER — Ambulatory Visit: Payer: Self-pay

## 2023-12-15 ENCOUNTER — Ambulatory Visit: Payer: Self-pay | Admitting: *Deleted

## 2023-12-15 NOTE — Telephone Encounter (Signed)
 Patient scheduled with laura 9/12

## 2023-12-15 NOTE — Telephone Encounter (Signed)
 Copied from CRM [id number]. Topic: Clinical - Red Word Triage >> Dec 15, 2023 10:18 AM Rosaria BRAVO wrote: Red Word that prompted transfer to Nurse Triage: Pt's son Tannah Dreyfuss called to report that the patient is experiencing back pain that is worsening.    ----------------------------------------------------------------------- From previous Reason for Contact - Scheduling: Patient/patient representative is calling to schedule an appointment. Refer to attachments for appointment information. Reason for Disposition  Back pain is a chronic symptom (recurrent or ongoing AND present > 4 weeks)  Answer Assessment - Initial Assessment Questions 1. ONSET: When did the pain begin? (e.g., minutes, hours, days)     [name] Sauers, son calling in. She is having pain in her back.   She's been taken steroid injections for years.   She had an appt this morning.   I'm in [facility] now.   They want me to reschedule because we were late for our appt.   Her PCP was Dr. Melvenia but he left the practice.  Next available appt here is 2 weeks out.   They told me to call this line to get an appt with another clinic.     2. LOCATION: Where does it hurt? 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   3. SEVERITY: How bad is the pain?  (e.g., Scale 1-10; mild, moderate, or severe)     7/10 at rest     8/10 with ambulation 4. PATTERN: Is the pain constant? (e.g., yes, no; constant, intermittent)      She's in chronic pain.   She gets steroid injections every 3-4 months.   This time they wanted to see her because she has not been seen in this office since last year.   She has been getting Hospice Care at home.   So she has not been seen at [facility].   Is there another location in [place] where she can be seen today? 5. RADIATION: Does the pain shoot into your legs or somewhere else?     Right leg 6. CAUSE:  What do you think is causing  the back pain?      She has chronic sciatic pain.  She's had this for years. 7. BACK OVERUSE:  Any recent lifting of heavy objects, strenuous work or exercise?     Chronic sciatic pain 8. MEDICINES: What have you taken so far for the pain? (e.g., nothing, acetaminophen , NSAIDS)     Gets steroid injections in her back 9. NEUROLOGIC SYMPTOMS: Do you have any weakness, numbness, or problems with bowel/bladder control?     Not asked since chronic 10. OTHER SYMPTOMS: Do you have any other symptoms? (e.g., fever, abdomen pain, burning with urination, blood in urine)       No 11. PREGNANCY: Is there any chance you are pregnant? When was your last menstrual period?       N/A due to age  Protocols used: Back Pain-A-AH Pt's son Anastassia Noack calling from within the practice at [facility].   They were late for her appt this morning with Leita Longs, NP.  His mother receives steroid injections in her back for chronic sciatic pain.   The provider that gives the injections is wanting pt to follow up with her PCP in order to continue giving the injections and for an order.   I asked Evalene if they could reschedule his mother while he was still in the office  but they told him to call the Call Center so she could see another doctor about her back pain at another practice.   I let him know the referral for the steroid shots need to come from his mother's PCP that another provider from another practice that doesn't know his mother would not give the referral needed.   I got his mother rescheduled with Leita Longs, NP (who she was supposed to see today but missed the appt due to being late) for 12/17/2023.   Evalene was agreeable to this.         FYI Only or Action Required?: FYI only for provider.  Patient was last seen in primary care on 02/04/2023 by Terry Wilhelmena Lloyd Hilario, FNP.  Called Nurse Triage reporting Back Pain.lower back pain.    Pt is in [facility]  at the time of this call.     Symptoms began chronic sciatic pain for years.  Gets steroid injections in her back.  Interventions attempted: Prescription medications: gets steroid injections in her back for sciatic pain..  Symptoms are: unchanged.  Triage Disposition: See PCP Within 2 Weeks  Patient/caregiver understands and will follow disposition?: Yes

## 2023-12-17 ENCOUNTER — Ambulatory Visit (INDEPENDENT_AMBULATORY_CARE_PROVIDER_SITE_OTHER)

## 2023-12-17 VITALS — BP 99/57 | HR 88 | Ht 65.0 in | Wt 150.0 lb

## 2023-12-17 DIAGNOSIS — I10 Essential (primary) hypertension: Secondary | ICD-10-CM | POA: Diagnosis not present

## 2023-12-17 DIAGNOSIS — J9612 Chronic respiratory failure with hypercapnia: Secondary | ICD-10-CM

## 2023-12-17 DIAGNOSIS — M5431 Sciatica, right side: Secondary | ICD-10-CM

## 2023-12-17 DIAGNOSIS — I509 Heart failure, unspecified: Secondary | ICD-10-CM

## 2023-12-17 DIAGNOSIS — F17210 Nicotine dependence, cigarettes, uncomplicated: Secondary | ICD-10-CM

## 2023-12-17 DIAGNOSIS — M5432 Sciatica, left side: Secondary | ICD-10-CM

## 2023-12-17 DIAGNOSIS — J9611 Chronic respiratory failure with hypoxia: Secondary | ICD-10-CM | POA: Diagnosis not present

## 2023-12-17 DIAGNOSIS — Z515 Encounter for palliative care: Secondary | ICD-10-CM

## 2023-12-17 DIAGNOSIS — Z23 Encounter for immunization: Secondary | ICD-10-CM

## 2023-12-17 MED ORDER — NICOTINE 7 MG/24HR TD PT24: 7.0000 mg | MEDICATED_PATCH | Freq: Every day | TRANSDERMAL | 5 refills | Status: AC
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
## 2023-12-21 NOTE — Assessment & Plan Note (Signed)
 Chronic nicotine  dependence with reduced smoking. Previous nicotine  replacement therapies caused adverse effects. Smoking poses fire risk and worsens respiratory issues. - Prescribed lowest dose nicotine  patches to minimize side effects, with gradual tapering. - Instructed on proper patch use: apply in morning on clean, dry skin, alternate arms, remove at night. - Advised to remove patch if palpitations occur. - Counseled on not smoking while using patches to prevent excessive nicotine  intake.
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
## 2023-12-21 NOTE — Assessment & Plan Note (Signed)
Euvolemic on exam today.  She is currently prescribed Lasix 40 mg M/W/F

## 2023-12-21 NOTE — Assessment & Plan Note (Signed)
 She has home hospice.  They are currently coming out once a week.  Her son [name] is her POA.  He is at visit with her today.

## 2023-12-21 NOTE — Assessment & Plan Note (Signed)
 History of HTN but not on any antihypertensive medications currently.  BP is on low side.  Recommend maintaining good oral hydration.

## 2023-12-21 NOTE — Assessment & Plan Note (Signed)
 Chronic right-sided sciatica managed with steroid injections and oral medications. Requires coordination for continued management. - Coordinated with [facility] for reordering steroid injections.

## 2023-12-27 ENCOUNTER — Telehealth: Payer: Self-pay

## 2023-12-27 ENCOUNTER — Other Ambulatory Visit: Payer: Self-pay

## 2023-12-27 DIAGNOSIS — M5431 Sciatica, right side: Secondary | ICD-10-CM

## 2023-12-27 NOTE — Telephone Encounter (Signed)
Spoke to son

## 2023-12-27 NOTE — Telephone Encounter (Signed)
 Copied from CRM [id number]. Topic: Referral - Question >> Dec 27, 2023  1:33 PM Emylou G wrote: Son calling checking status of referral.. Pls advise. He would like call back today

## 2023-12-27 NOTE — Telephone Encounter (Signed)
 There has been no Referrals placed since 02/2023.

## 2023-12-28 ENCOUNTER — Ambulatory Visit (INDEPENDENT_AMBULATORY_CARE_PROVIDER_SITE_OTHER)

## 2023-12-28 VITALS — Ht 66.0 in | Wt 150.0 lb

## 2023-12-28 DIAGNOSIS — Z0001 Encounter for general adult medical examination with abnormal findings: Secondary | ICD-10-CM | POA: Diagnosis not present

## 2023-12-28 DIAGNOSIS — Z1159 Encounter for screening for other viral diseases: Secondary | ICD-10-CM

## 2023-12-28 DIAGNOSIS — Z780 Asymptomatic menopausal state: Secondary | ICD-10-CM | POA: Diagnosis not present

## 2023-12-28 DIAGNOSIS — Z0000 Encounter for general adult medical examination without abnormal findings: Secondary | ICD-10-CM

## 2023-12-28 DIAGNOSIS — F17210 Nicotine dependence, cigarettes, uncomplicated: Secondary | ICD-10-CM

## 2023-12-28 NOTE — Progress Notes (Signed)
 Subjective:   [name] is a 78 y.o. who presents for a Medicare Wellness preventive visit.  As a reminder, Annual Wellness Visits don't include a physical exam, and some assessments may be limited, especially if this visit is performed virtually. We may recommend an in-person follow-up visit with your provider if needed.  Visit Complete: Virtual I connected with  [name] on 12/28/23 by a video and audio enabled telemedicine application and verified that I am speaking with the correct person using two identifiers.  Patient Location: Other:  car  Provider Location: Home Office  I discussed the limitations of evaluation and management by telemedicine. The patient expressed understanding and agreed to proceed.  Vital Signs: Because this visit was a virtual/telehealth visit, some criteria may be missing or patient reported. Any vitals not documented were not able to be obtained and vitals that have been documented are patient reported.  Persons Participating in Visit: Patient.  AWV Questionnaire: No: Patient Medicare AWV questionnaire was not completed prior to this visit.  Cardiac Risk Factors include: advanced age (>46men, >23 women);hypertension;sedentary lifestyle;smoking/ tobacco exposure;Other (see comment), Risk factor comments: copd, chf, chronic respiratory failure on home oxygen      Objective:    Today's Vitals   12/28/23 1403  Weight: 150 lb (68 kg)  Height: 5' 6 (1.676 m)   Body mass index is 24.21 kg/m.     12/28/2023    2:01 PM 01/22/2023    7:49 AM 01/14/2023    8:32 AM 12/24/2022   12:58 PM 11/25/2021   10:05 AM 09/19/2021    2:14 PM 06/06/2021    7:59 PM  Advanced Directives  Does Patient Have a Medical Advance Directive? No No No No No Yes Yes;No  Type of Advance Directive      Healthcare Power of Attorney   Does patient want to make changes to medical advance directive?       No - Patient declined  Would patient like information  on creating a medical advance directive? No - Patient declined No - Patient declined No - Patient declined No - Patient declined No - Patient declined  No - Patient declined    Current Medications (verified) Outpatient Encounter Medications as of 12/28/2023  Medication Sig   albuterol  (2.5 MG/3ML) 0.083% NEBU 3 mL, albuterol  (5 MG/ML) 0.5% NEBU 0.5 mL Inhale into the lungs.   albuterol  (VENTOLIN  HFA) 108 (90 Base) MCG/ACT inhaler Inhale 2 puffs into the lungs every 4 (four) hours as needed for wheezing or shortness of breath.   atorvastatin  (LIPITOR) 40 MG tablet Take 1 tablet (40 mg total) by mouth daily.   Budeson-Glycopyrrol-Formoterol (BREZTRI  AEROSPHERE) 160-9-4.8 MCG/ACT AERO Inhale 2 puffs into the lungs 2 (two) times daily.   clonazePAM  (KLONOPIN ) 0.5 MG tablet Take 0.5 mg by mouth every 6 (six) hours as needed.   COMBIVENT  RESPIMAT 20-100 MCG/ACT AERS respimat Inhale 1 puff into the lungs every 4 (four) hours as needed for wheezing or shortness of breath.   famotidine  (PEPCID ) 20 MG tablet One after supper (Patient taking differently: Take 20 mg by mouth daily. Takes in the morning)   furosemide  (LASIX ) 40 MG tablet Take 1 tablet (40 mg total) by mouth 3 (three) times a week. (Patient taking differently: Take 40 mg by mouth daily.)   gabapentin  (NEURONTIN ) 300 MG capsule Take 300 mg by mouth 2 (two) times daily.   HYDROcodone -acetaminophen  (NORCO) 10-325 MG tablet Take 1 tablet by mouth every 4 (four) hours as needed (  severe back pain.).   metolazone (ZAROXOLYN) 5 MG tablet Take 5 mg by mouth daily.   nicotine  (NICODERM CQ ) 7 mg/24hr patch Place 1 patch (7 mg total) onto the skin daily.   nitroGLYCERIN  (NITROSTAT ) 0.4 MG SL tablet Place 1 tablet (0.4 mg total) under the tongue every 5 (five) minutes as needed for chest pain.   OXYGEN  Inhale 2 L into the lungs.   pantoprazole  (PROTONIX ) 40 MG tablet Take 30-60 min before first meal of the day (Patient taking differently: Take 40 mg by  mouth at bedtime. Take 30-60 min before first meal of the day)   potassium chloride  (KLOR-CON ) 10 MEQ tablet Take 1 tablet (10 mEq total) by mouth every Monday, Wednesday, and Friday. Take 1 tab when you take the furosemide /Lasix  tablet (Patient taking differently: Take 10 mEq by mouth daily.)   sertraline (ZOLOFT) 20 MG/ML concentrated solution Take 20 mg by mouth daily.   No facility-administered encounter medications on file as of 12/28/2023.    Allergies (verified) Chantix [varenicline], Codeine, Opsumit  [macitentan ], Tadalafil , Tyvaso  [treprostinil ], and Oxycodone   History: Past Medical History:  Diagnosis Date   Arthritis    Chronic back pain    Chronic heart failure with preserved ejection fraction (HFpEF) (HCC)    a. 06/2021 Echo: EF 60-65%, no rwma, GrI DD, mod-sev reduced RV fxn.   COPD (chronic obstructive pulmonary disease) (HCC)    patient is on oxygen  at night when needed   Depression    GERD (gastroesophageal reflux disease)    Hypertension    Neuropathy    Nonobstructive CAD (coronary artery disease)    a. 06/2021 NSTEMI/Cath: LM nl, LAD 25d, LCX nl, RCA 30p.   PAH (pulmonary artery hypertension) (HCC)    a. 04/2018 PA 55/21 (36); b. 06/2021 Echo: EF 60-65%, no rwma, GrI DD, mod-sev reduced RV fxn, mod BAE, triv MR, mod TR; c. 06/2021 Cath: PA mean 35, PVR 7 WU.   Thyroid  disease    Tobacco abuse    Ulcer    Billroth I   Past Surgical History:  Procedure Laterality Date   ABDOMINAL HYSTERECTOMY     BACK SURGERY     X4   BILROTH I PROCEDURE     BIOPSY  12/18/2019   Procedure: BIOPSY;  Surgeon: Shaaron Lamar HERO, MD;  [facility];  Service: Endoscopy;;   BIOPSY  12/27/2020   Procedure: BIOPSY;  Surgeon: Shaaron Lamar HERO, MD;  Location: [facility];  Service: Endoscopy;;   CHOLECYSTECTOMY     COLONOSCOPY  05/18/2003   MFM:Pwrnfeozuz colonoscopy/ Internal hemorrhoids.  Otherwise, normal rectum   COLONOSCOPY  08/12/2010   Dr. Mavis: cecum visualized  and normal, colon and rectum normal. Torturous colon   COLONOSCOPY N/A 09/07/2012   RMR: tubular adenoma, lipoma. Due for surveillance in 2021   COLONOSCOPY WITH PROPOFOL  N/A 12/18/2019   Rourk: Three 2-30mm polyps removed from the descending colon, tubular adenomas.  Diverticulosis.  Noncompliant left colon.   ESOPHAGOGASTRODUODENOSCOPY  05/18/2003   RMR:. Normal esophagus/Adenomatous-appearing mucosa at the anastomosis   ESOPHAGOGASTRODUODENOSCOPY  08/12/2010   Dr. Mavis: anastomosis widely patent, no ulcerations, CLO test negative   ESOPHAGOGASTRODUODENOSCOPY (EGD) WITH PROPOFOL  N/A 12/18/2019   Rourk: Normal esophagus status post dilation.  Prior hemigastrectomy.  Small bowel nodule of uncertain significance.  Biopsy with mild reactive changes and focal intestinal metaplasia.  Recommend 1 year follow-up EGD.   ESOPHAGOGASTRODUODENOSCOPY (EGD) WITH PROPOFOL  N/A 12/27/2020   Rourk:Normal esophagus s/p dilation, s/p hemigastrectomy, Billroth I  configuration, anastomotic nodule biopsied, polypoid duodenal mucosa with surface gastric foveolar metaplasia s/p peptic injury, no malignancy   MALONEY DILATION N/A 12/18/2019   Procedure: MALONEY DILATION;  Surgeon: Shaaron Lamar HERO, MD;  [facility]: [facility];  Service: Endoscopy;  Laterality: N/A;   MALONEY DILATION N/A 12/27/2020   Procedure: AGAPITO DILATION;  Surgeon: Shaaron Lamar HERO, MD;  Location: [facility];  Service: Endoscopy;  Laterality: N/A;   POLYPECTOMY  12/18/2019   Procedure: POLYPECTOMY;  Surgeon: Shaaron Lamar HERO, MD;  [facility]: [facility];  Service: Endoscopy;;   RIGHT HEART CATH N/A 05/04/2018   Procedure: RIGHT HEART CATH;  Surgeon: Rolan Ezra RAMAN, MD;  Location: [facility] INVASIVE CV LAB;  Service: Cardiovascular;  Laterality: N/A;   RIGHT/LEFT HEART CATH AND CORONARY ANGIOGRAPHY N/A 06/06/2021   Procedure: RIGHT/LEFT HEART CATH AND CORONARY ANGIOGRAPHY;  Surgeon: [place]azil[place], Peter M, MD;  Location: [facility] INVASIVE CV LAB;  Service:  Cardiovascular;  Laterality: N/A;   STOMACH SURGERY     removed partial stomach   YAG LASER APPLICATION Left 01/14/2016   Procedure: YAG LASER APPLICATION;  Surgeon: Oneil Platts, MD;  Location: [facility];  Service: Ophthalmology;  Laterality: Left;   YAG LASER APPLICATION Right 01/28/2016   Procedure: YAG LASER APPLICATION;  Surgeon: Oneil Platts, MD;  Location: [facility];  Service: Ophthalmology;  Laterality: Right;   Family History  Problem Relation Age of Onset   Breast cancer Sister    Cancer Sister        breast cancer   Cancer Maternal Aunt        pancreas?   Cancer Paternal Aunt        multiple aunts had cancer   Cancer Brother 89       esophageal cancer   Colon cancer Neg Hx    Social History   Socioeconomic History   Marital status: Married    Spouse name: Not on file   Number of children: Not on file   Years of education: Not on file   Highest education level: Not on file  Occupational History   Not on file  Tobacco Use   Smoking status: Every Day    Current packs/day: 0.25    Average packs/day: 1 pack/day for 59.7 years (59.2 ttl pk-yrs)    Types: Cigarettes    Start date: 1966   Smokeless tobacco: Never   Tobacco comments:    1/2 ppd 09/22/21   Vaping Use   Vaping status: Never Used  Substance and Sexual Activity   Alcohol use: No   Drug use: No   Sexual activity: Not Currently  Other Topics Concern   Not on file  Social History Narrative   Patient lost her husband in April 2024. She lives alone. She has a chaplain come by once weekly for counseling due to depression since she lost her husband.    Social Drivers of Corporate investment banker Strain: Low Risk  (12/28/2023)   Overall Financial Resource Strain (CARDIA)    Difficulty of Paying Living Expenses: Not hard at all  Food Insecurity: No Food Insecurity (12/28/2023)   Hunger Vital Sign    Worried About Running Out of Food in the Last Year: Never true    Ran Out of Food in the Last Year: Never true   Transportation Needs: No Transportation Needs (12/28/2023)   PRAPARE - Administrator, Civil Service (Medical): No    Lack of Transportation (Non-Medical): No  Physical Activity: Inactive (12/28/2023)  Exercise Vital Sign    Days of Exercise per Week: 0 days    Minutes of Exercise per Session: 0 min  Stress: Patient Declined (12/28/2023)   [employer] of Occupational Health - Occupational Stress Questionnaire    Feeling of Stress: Patient declined  Social Connections: Moderately Isolated (12/28/2023)   Social Connection and Isolation Panel    Frequency of Communication with Friends and Family: More than three times a week    Frequency of Social Gatherings with Friends and Family: More than three times a week    Attends Religious Services: More than 4 times per year    Active Member of [employer] or Organizations: No    Attends Banker Meetings: Never    Marital Status: Widowed    Tobacco Counseling Ready to quit: Yes Counseling given: Yes Tobacco comments: 1/2 ppd 09/22/21     Clinical Intake:  Pre-visit preparation completed: Yes  Pain : No/denies pain     Nutritional Risks: None Diabetes: Yes CBG done?: No Did pt. bring in CBG monitor from home?: No  Lab Results  Component Value Date   HGBA1C 5.4 06/07/2021     How often do you need to have someone help you when you read instructions, pamphlets, or other written materials from your doctor or pharmacy?: 1 - Never  Interpreter Needed?: No  Information entered by :: Xylia Scherger W CMA (AAMA)   Activities of Daily Living     12/28/2023    2:35 PM  In your present state of health, do you have any difficulty performing the following activities:  Hearing? 0  Vision? 0  Difficulty concentrating or making decisions? 1  Walking or climbing stairs? 0  Dressing or bathing? 0  Doing errands, shopping? 0  Preparing Food and eating ? N  Using the Toilet? N  In the past six months, have you  accidently leaked urine? N  Do you have problems with loss of bowel control? N  Managing your Medications? Y  Managing your Finances? N  Housekeeping or managing your Housekeeping? N    Patient Care Team: Bevely Doffing, FNP as PCP - General (Family Medicine) Shaaron Lamar HERO, MD as Attending Physician (Gastroenterology) Darlean Ozell NOVAK, MD as Consulting Physician (Pulmonary Disease) Rolan Ezra RAMAN, MD as Consulting Physician (Cardiology) Okey Vina GAILS, MD as Consulting Physician (Cardiology)  I have updated your Care Teams any recent Medical Services you may have received from other providers in the past year.     Assessment:   This is a routine wellness examination for Livie.  Hearing/Vision screen Hearing Screening - Comments:: Patient denies any hearing difficulties.   Vision Screening - Comments:: Patient does not have an eye doctor. A list of eye doctors has been provided to the patient.     Goals Addressed             This Visit's Progress    Patient Stated   On track    Remain active         Depression Screen     12/28/2023    2:33 PM 02/04/2023    4:00 PM 12/24/2022    1:03 PM 09/28/2022    2:56 PM  PHQ 2/9 Scores  PHQ - 2 Score 0 5 3 2   PHQ- 9 Score 0 25 10 6      Fall Risk     12/28/2023    2:35 PM 02/04/2023    4:00 PM 12/24/2022    1:11 PM 09/28/2022  2:56 PM  Fall Risk   Falls in the past year? 1 1 1 1   Number falls in past yr: 1 0 1 1  Injury with Fall? 1 0 0 0  Risk for fall due to : History of fall(s);Impaired balance/gait;Impaired mobility History of fall(s);Impaired balance/gait Impaired balance/gait;Impaired mobility;History of fall(s)   Follow up Falls evaluation completed;Education provided;Falls prevention discussed Follow up appointment Education provided;Falls prevention discussed     MEDICARE RISK AT HOME:  Medicare Risk at Home Any stairs in or around the home?: No If so, are there any without handrails?: No Home free  of loose throw rugs in walkways, pet beds, electrical cords, etc?: Yes Adequate lighting in your home to reduce risk of falls?: Yes Life alert?: No Use of a cane, walker or w/c?: No Grab bars in the bathroom?: Yes Shower chair or bench in shower?: Yes Elevated toilet seat or a handicapped toilet?: Yes  TIMED UP AND GO:  Was the test performed?  No  Cognitive Function: Unable: Due to language barrier, hearing or vision limitations or otherdiagnosis of cognitive impairment    12/28/2023    2:39 PM 03/09/2023    1:31 PM 02/05/2023    8:31 AM 02/04/2023    4:52 PM  MMSE - Mini Mental State Exam  Not completed: Unable to complete     Orientation to time  4 4 4   Orientation to Place  5 3 3   Registration  3 3 3   Attention/ Calculation  2 4 4   Recall  3 3 3   Language- name 2 objects  2 2 2   Language- repeat  0 1 1  Language- follow 3 step command  2 3 3   Language- read & follow direction  1 1 1   Write a sentence  1 1 1   Copy design  0 0 1  Total score  23 25 26         12/24/2022    1:03 PM  6CIT Screen  What Year? 0 points  What month? 0 points  What time? 0 points  Count back from 20 0 points  Months in reverse 0 points  Repeat phrase 0 points  Total Score 0 points    Immunizations Immunization History  Administered Date(s) Administered   Fluad Trivalent(High Dose 65+) 12/31/2022   INFLUENZA, HIGH DOSE SEASONAL PF 01/14/2019, 12/17/2023   Influenza-Unspecified 02/08/2017   PFIZER(Purple Top)SARS-COV-2 Vaccination 06/01/2019, 06/27/2019   PNEUMOCOCCAL CONJUGATE-20 12/31/2022, 12/17/2023   Tdap 11/25/2021    Screening Tests Health Maintenance  Topic Date Due   Hepatitis C Screening  Never done   Zoster Vaccines- Shingrix (1 of 2) Never done   DEXA SCAN  Never done   COVID-19 Vaccine (3 - 2025-26 season) 12/06/2023   Lung Cancer Screening  12/28/2023   Mammogram  09/07/2024   DTaP/Tdap/Td (2 - Td or Tdap) 11/26/2031   Pneumococcal Vaccine: 50+ Years  Completed    Influenza Vaccine  Completed   HPV VACCINES  Aged Out   Meningococcal B Vaccine  Aged Out   Colonoscopy  Discontinued    Health Maintenance Health Maintenance Due  Topic Date Due   Hepatitis C Screening  Never done   Zoster Vaccines- Shingrix (1 of 2) Never done   DEXA SCAN  Never done   COVID-19 Vaccine (3 - 2025-26 season) 12/06/2023   Lung Cancer Screening  12/28/2023   Health Maintenance Items Addressed: DEXA ordered, Referral sent to [facility] (smoker/hx smoking)  Additional Screening:  Vision Screening:  Recommended annual ophthalmology exams for early detection of glaucoma and other disorders of the eye. Would you like a referral to an eye doctor? A list of eye care providers were provided to the patient  Dental Screening: Recommended annual dental exams for proper oral hygiene  Community Resource Referral / Chronic Care Management: CRR required this visit?  No   CCM required this visit?  No   Plan:    I have personally reviewed and noted the following in the patient's chart:   Medical and social history Use of alcohol, tobacco or illicit drugs  Current medications and supplements including opioid prescriptions. Patient is currently taking opioid prescriptions. Information provided to patient regarding non-opioid alternatives. Patient advised to discuss non-opioid treatment plan with their provider. Functional ability and status Nutritional status Physical activity Advanced directives List of other physicians Hospitalizations, surgeries, and ER visits in previous 12 months Vitals Screenings to include cognitive, depression, and falls Referrals and appointments  In addition, I have reviewed and discussed with patient certain preventive protocols, quality metrics, and best practice recommendations. A written personalized care plan for preventive services as well as general preventive health recommendations were provided to patient.   Lamaria Hildebrandt,  CMA   12/28/2023   After Visit Summary: (MyChart) Due to this being a telephonic visit, the after visit summary with patients personalized plan was offered to patient via MyChart   Notes: Nothing significant to report at this time.

## 2023-12-28 NOTE — Patient Instructions (Addendum)
 [name],  Thank you for taking the time for your Medicare Wellness Visit. I appreciate your continued commitment to your health goals. Please review the care plan we discussed, and feel free to reach out if I can assist you further.  Medicare recommends these wellness visits once per year to help you and your care team stay ahead of potential health issues. These visits are designed to focus on prevention, allowing your provider to concentrate on managing your acute and chronic conditions during your regular appointments.  Please note that Annual Wellness Visits do not include a physical exam. Some assessments may be limited, especially if the visit was conducted virtually. If needed, we may recommend a separate in-person follow-up with your provider.  Ongoing Care Seeing your primary care provider every 3 to 6 months helps us  monitor your health and provide consistent, personalized care.   Referrals If a referral was made during today's visit and you haven't received any updates within two weeks, please contact the referred provider directly to check on the status.  Lung Cancer Screening-[place] Office [address]-First Floor Medical Building directly across from [facility] Phone Number:[phone]   Osteoporosis Screening: Please call the number below to schedule your appt. [facility] Phone: [phone]  Lab Orders         Hepatitis C antibody    Have these labs completed at [facility].   Recommended Screenings:  Health Maintenance  Topic Date Due   Hepatitis C Screening  Never done   Zoster (Shingles) Vaccine (1 of 2) Never done   DEXA scan (bone density measurement)  Never done   COVID-19 Vaccine (3 - 2025-26 season) 12/06/2023   Screening for Lung Cancer  12/28/2023   Breast Cancer Screening  09/07/2024   DTaP/Tdap/Td vaccine (2 - Td or Tdap) 11/26/2031   Pneumococcal Vaccine for age over 74  Completed   Flu Shot   Completed   HPV Vaccine  Aged Out   Meningitis B Vaccine  Aged Out   Colon Cancer Screening  Discontinued       12/28/2023    2:01 PM  Advanced Directives  Does Patient Have a Medical Advance Directive? No  Would patient like information on creating a medical advance directive? No - Patient declined   Advance Care Planning is important because it: Ensures you receive medical care that aligns with your values, goals, and preferences. Provides guidance to your family and loved ones, reducing the emotional burden of decision-making during critical moments.  Vision: Annual vision screenings are recommended for early detection of glaucoma, cataracts, and diabetic retinopathy. These exams can also reveal signs of chronic conditions such as diabetes and high blood pressure.  Dental: Annual dental screenings help detect early signs of oral cancer, gum disease, and other conditions linked to overall health, including heart disease and diabetes.  Please see the attached documents for additional preventive care recommendations.

## 2023-12-29 ENCOUNTER — Other Ambulatory Visit: Payer: Self-pay

## 2023-12-29 DIAGNOSIS — M5431 Sciatica, right side: Secondary | ICD-10-CM

## 2024-01-07 NOTE — Discharge Instructions (Signed)

## 2024-01-10 ENCOUNTER — Ambulatory Visit: Admission: RE | Admit: 2024-01-10 | Discharge: 2024-01-10 | Disposition: A | Source: Ambulatory Visit

## 2024-01-10 DIAGNOSIS — M5431 Sciatica, right side: Secondary | ICD-10-CM

## 2024-01-10 MED ORDER — METHYLPREDNISOLONE ACETATE 40 MG/ML INJ SUSP (RADIOLOG
80.0000 mg | Freq: Once | INTRAMUSCULAR | Status: AC
  Administered 2024-01-10: 80 mg via EPIDURAL

## 2024-01-10 MED ORDER — IOPAMIDOL (ISOVUE-M 200) INJECTION 41%
1.0000 mL | Freq: Once | INTRAMUSCULAR | Status: AC
  Administered 2024-01-10: 1 mL via EPIDURAL

## 2024-03-07 ENCOUNTER — Telehealth: Payer: Self-pay

## 2024-03-07 NOTE — Telephone Encounter (Signed)
 Copied from CRM #[id number]. Topic: Clinical - Request for Lab/Test Order >> Mar 07, 2024  4:26 PM Shereese L wrote: Reason for CRM: Patient is requesting a order for a steroid shot in her back for chronic pain. Order needs to go to [facility] Requesting a call back when done [phone]

## 2024-03-08 ENCOUNTER — Other Ambulatory Visit: Payer: Self-pay | Admitting: Internal Medicine

## 2024-03-08 DIAGNOSIS — M5416 Radiculopathy, lumbar region: Secondary | ICD-10-CM

## 2024-03-10 ENCOUNTER — Other Ambulatory Visit: Payer: Self-pay | Admitting: Internal Medicine

## 2024-03-10 DIAGNOSIS — M5416 Radiculopathy, lumbar region: Secondary | ICD-10-CM

## 2024-03-21 NOTE — Discharge Instructions (Signed)

## 2024-03-22 ENCOUNTER — Inpatient Hospital Stay: Admission: RE | Admit: 2024-03-22 | Discharge: 2024-03-22 | Attending: Internal Medicine

## 2024-03-22 DIAGNOSIS — M5416 Radiculopathy, lumbar region: Secondary | ICD-10-CM

## 2024-03-22 MED ORDER — IOPAMIDOL (ISOVUE-M 200) INJECTION 41%
1.0000 mL | Freq: Once | INTRAMUSCULAR | Status: AC
  Administered 2024-03-22: 09:00:00 1 mL via EPIDURAL

## 2024-03-22 MED ORDER — METHYLPREDNISOLONE ACETATE 40 MG/ML INJ SUSP (RADIOLOG
80.0000 mg | Freq: Once | INTRAMUSCULAR | Status: AC
  Administered 2024-03-22: 09:00:00 80 mg via EPIDURAL

## 2024-05-05 ENCOUNTER — Encounter

## 2024-05-11 ENCOUNTER — Ambulatory Visit: Payer: Self-pay

## 2024-05-11 NOTE — Telephone Encounter (Signed)
 FYI Only or Action Required?: Action required by provider: Call from pt's son. He was calling to cancel and reschedule AWV. He mentioned to PAS that pt is having increasing SOB. He wants her to see Pulm and wanted to re-schedule AWV. He states that this increasing SOB is her new normal . Pt is in hospice care and is seen regular by those providers.   Scheduled pulm as requested and AWV.  Please advise. Patient was last seen in primary care on 12/17/2023 by Bevely Doffing, FNP.  Called Nurse Triage reporting Shortness of Breath and Advice Only.  Symptoms began ongoing.  Interventions attempted: Other:  .  Symptoms are: gradually worsening.  Triage Disposition: See PCP Within 2 Weeks  Patient/caregiver understands and will follow disposition?: Unsure                               Summary: SOB   Reason for Triage:Patient's son calling to reschedule physical but mentioned patient having increased shortness of breath & had to increase her oxygen  from 2 liters to 3 liters. pt also in hospice.       Reason for Disposition  Requesting regular office appointment  Nursing judgment  Answer Assessment - Initial Assessment Questions 1. REASON FOR CALL: What is the main reason for your call? or How can I best help you?     PT is in hospice care and is having breathing difficulty 2. SYMPTOMS : Do you have any symptoms?      Increasing SOB 3. OTHER QUESTIONS: Do you have any other questions?     Wants to schedule a pulm appt and AWV  Protocols used: Information Only Call - No Triage-A-[facility], No Guideline or Reference Available-A-[facility]

## 2024-05-12 NOTE — Telephone Encounter (Signed)
 Scheduled to see Dr.Wert for Increasing SOB

## 2024-05-12 NOTE — Telephone Encounter (Signed)
 Pt has appt on 05/22/2024 per [facility]

## 2024-05-22 ENCOUNTER — Ambulatory Visit: Admitting: Internal Medicine

## 2024-06-06 ENCOUNTER — Ambulatory Visit: Admitting: Physician Assistant

## 2024-12-29 ENCOUNTER — Ambulatory Visit: Payer: Self-pay

## 2025-01-01 ENCOUNTER — Ambulatory Visit
